# Patient Record
Sex: Male | Born: 1970 | Race: White | Hispanic: No | Marital: Married | Smoking: Never smoker
Health system: Southern US, Community
[De-identification: ages and names within clinical notes are randomized; demographics above are authoritative.]

## PROBLEM LIST (undated history)

## (undated) DIAGNOSIS — R911 Solitary pulmonary nodule: Secondary | ICD-10-CM

## (undated) DIAGNOSIS — R7303 Prediabetes: Secondary | ICD-10-CM

## (undated) DIAGNOSIS — Z87442 Personal history of urinary calculi: Secondary | ICD-10-CM

## (undated) DIAGNOSIS — I10 Essential (primary) hypertension: Secondary | ICD-10-CM

## (undated) DIAGNOSIS — M5136 Other intervertebral disc degeneration, lumbar region: Secondary | ICD-10-CM

## (undated) DIAGNOSIS — I712 Thoracic aortic aneurysm, without rupture, unspecified: Secondary | ICD-10-CM

## (undated) DIAGNOSIS — G4730 Sleep apnea, unspecified: Secondary | ICD-10-CM

## (undated) DIAGNOSIS — M1990 Unspecified osteoarthritis, unspecified site: Secondary | ICD-10-CM

## (undated) DIAGNOSIS — G5603 Carpal tunnel syndrome, bilateral upper limbs: Secondary | ICD-10-CM

## (undated) DIAGNOSIS — M51369 Other intervertebral disc degeneration, lumbar region without mention of lumbar back pain or lower extremity pain: Secondary | ICD-10-CM

## (undated) HISTORY — DX: Solitary pulmonary nodule: R91.1

## (undated) HISTORY — DX: Thoracic aortic aneurysm, without rupture, unspecified: I71.20

## (undated) HISTORY — DX: Thoracic aortic aneurysm, without rupture: I71.2

## (undated) HISTORY — PX: KNEE ARTHROSCOPY: SHX127

---

## 1997-11-16 ENCOUNTER — Emergency Department (HOSPITAL_COMMUNITY): Admission: EM | Admit: 1997-11-16 | Discharge: 1997-11-16 | Payer: Self-pay | Admitting: Emergency Medicine

## 1999-12-16 ENCOUNTER — Emergency Department (HOSPITAL_COMMUNITY): Admission: EM | Admit: 1999-12-16 | Discharge: 1999-12-16 | Payer: Self-pay | Admitting: Emergency Medicine

## 2000-01-30 ENCOUNTER — Emergency Department (HOSPITAL_COMMUNITY): Admission: EM | Admit: 2000-01-30 | Discharge: 2000-01-30 | Payer: Self-pay | Admitting: Emergency Medicine

## 2000-01-30 ENCOUNTER — Encounter: Payer: Self-pay | Admitting: Emergency Medicine

## 2000-09-03 ENCOUNTER — Encounter: Payer: Self-pay | Admitting: Emergency Medicine

## 2000-09-03 ENCOUNTER — Emergency Department (HOSPITAL_COMMUNITY): Admission: EM | Admit: 2000-09-03 | Discharge: 2000-09-03 | Payer: Self-pay | Admitting: Emergency Medicine

## 2003-01-31 ENCOUNTER — Emergency Department (HOSPITAL_COMMUNITY): Admission: EM | Admit: 2003-01-31 | Discharge: 2003-01-31 | Payer: Self-pay | Admitting: Emergency Medicine

## 2003-01-31 ENCOUNTER — Encounter: Payer: Self-pay | Admitting: Emergency Medicine

## 2007-02-14 ENCOUNTER — Observation Stay (HOSPITAL_COMMUNITY): Admission: EM | Admit: 2007-02-14 | Discharge: 2007-02-15 | Payer: Self-pay | Admitting: Emergency Medicine

## 2007-02-14 IMAGING — CR DG CHEST 2V
1 series · 1 of 1 positions shown · non-contrast
Comparison: None.

CLINICAL DATA: Chest pain, short of breath, hypertension.
 CHEST - 2 VIEW:

[w chest pa]
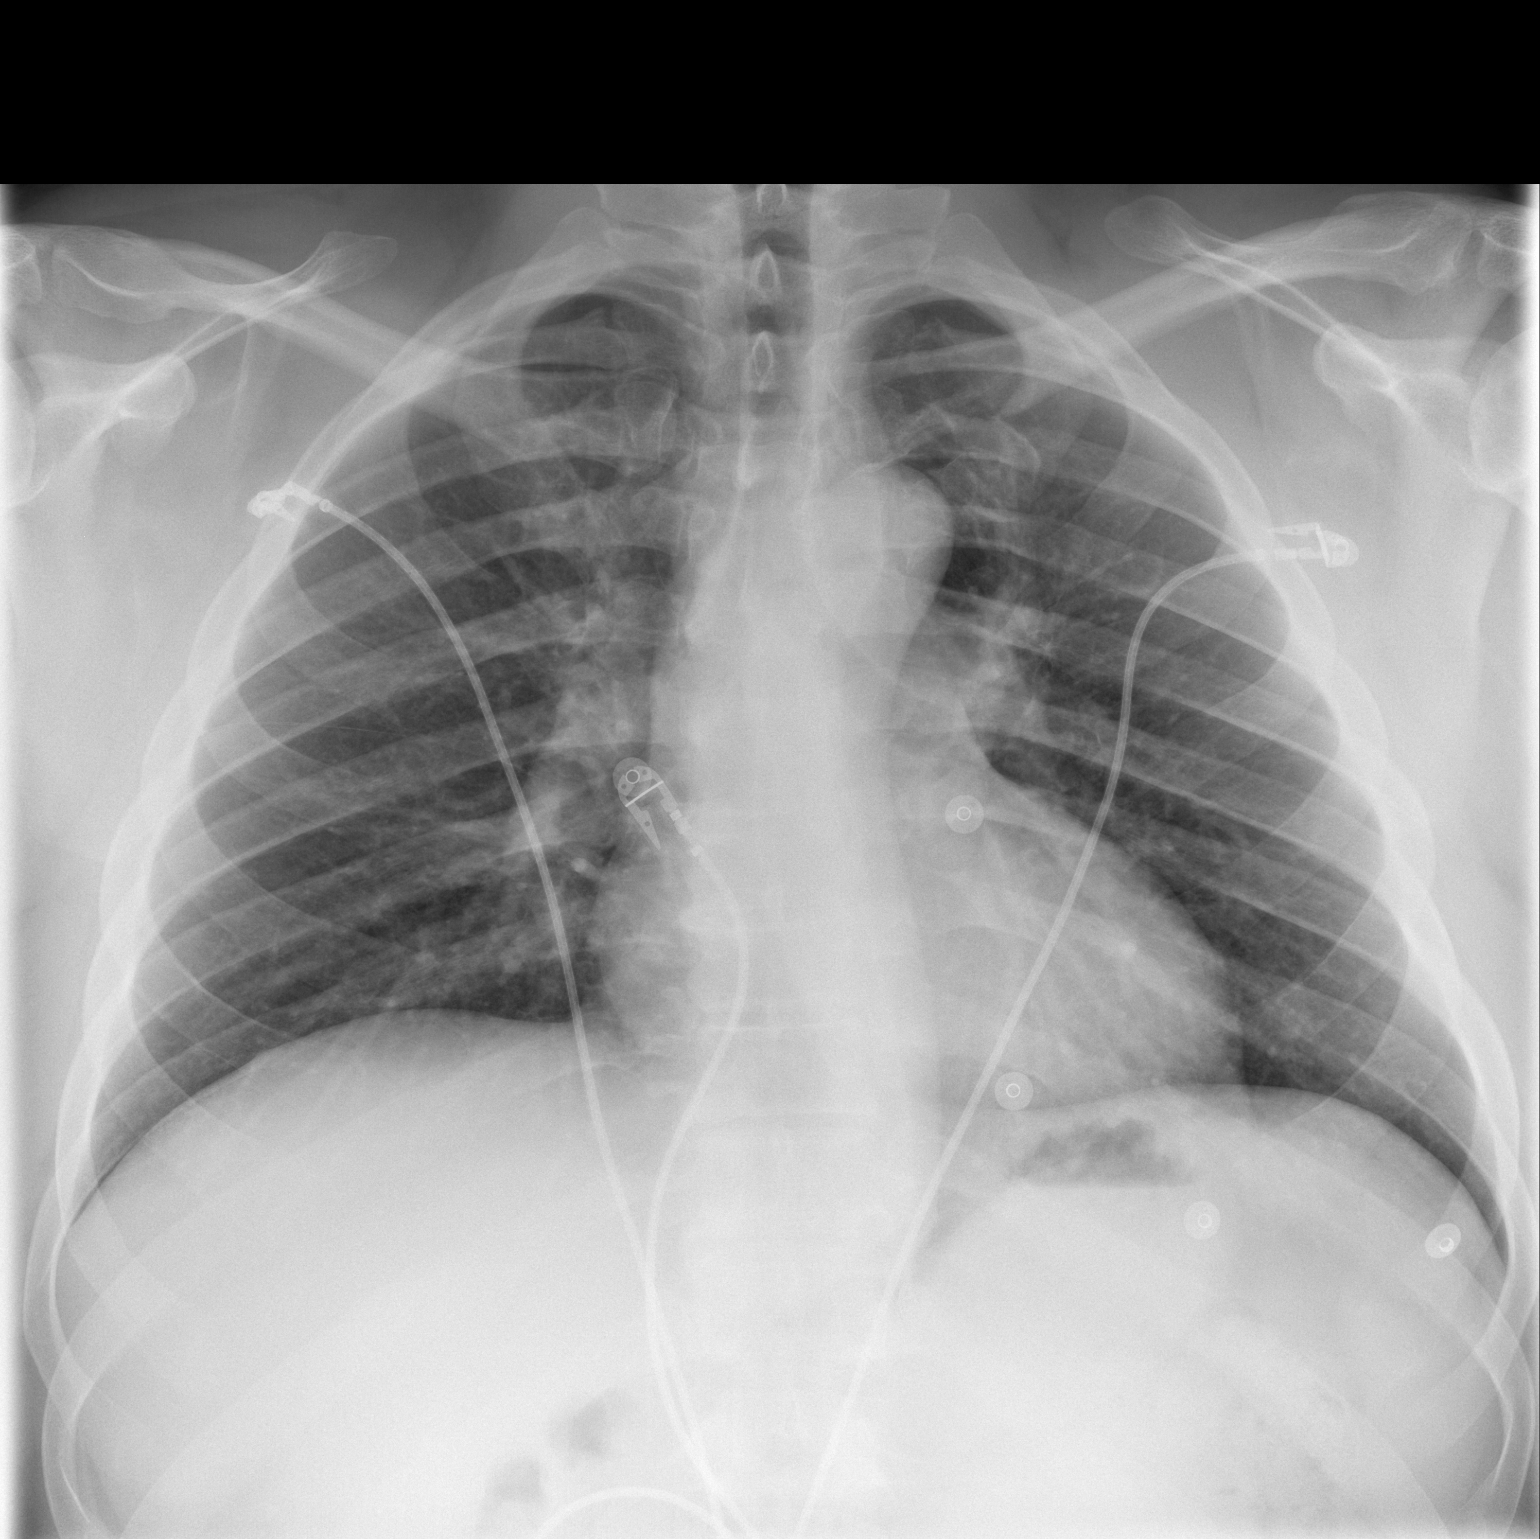

[1 of 1 positions shown; findings below may reference images not displayed]

FINDINGS: Artifact overlies the chest.  Heart size is normal.  The mediastinum is unremarkable.  The lungs are clear.  No effusions.  No significant bony finding.
IMPRESSION: No active disease.

## 2007-11-22 IMAGING — CR DG CERVICAL SPINE COMPLETE 4+V
1 series · 2 of 2 positions shown · non-contrast
Comparison: NONE

CLINICAL DATA: Right hand numbness. 

CERVICAL SPINE ??? AP AND LATERAL VIEWS

[Series 1: view not recorded · 0.17mm/px · 2 of 2 slices shown]
[im 1/2]
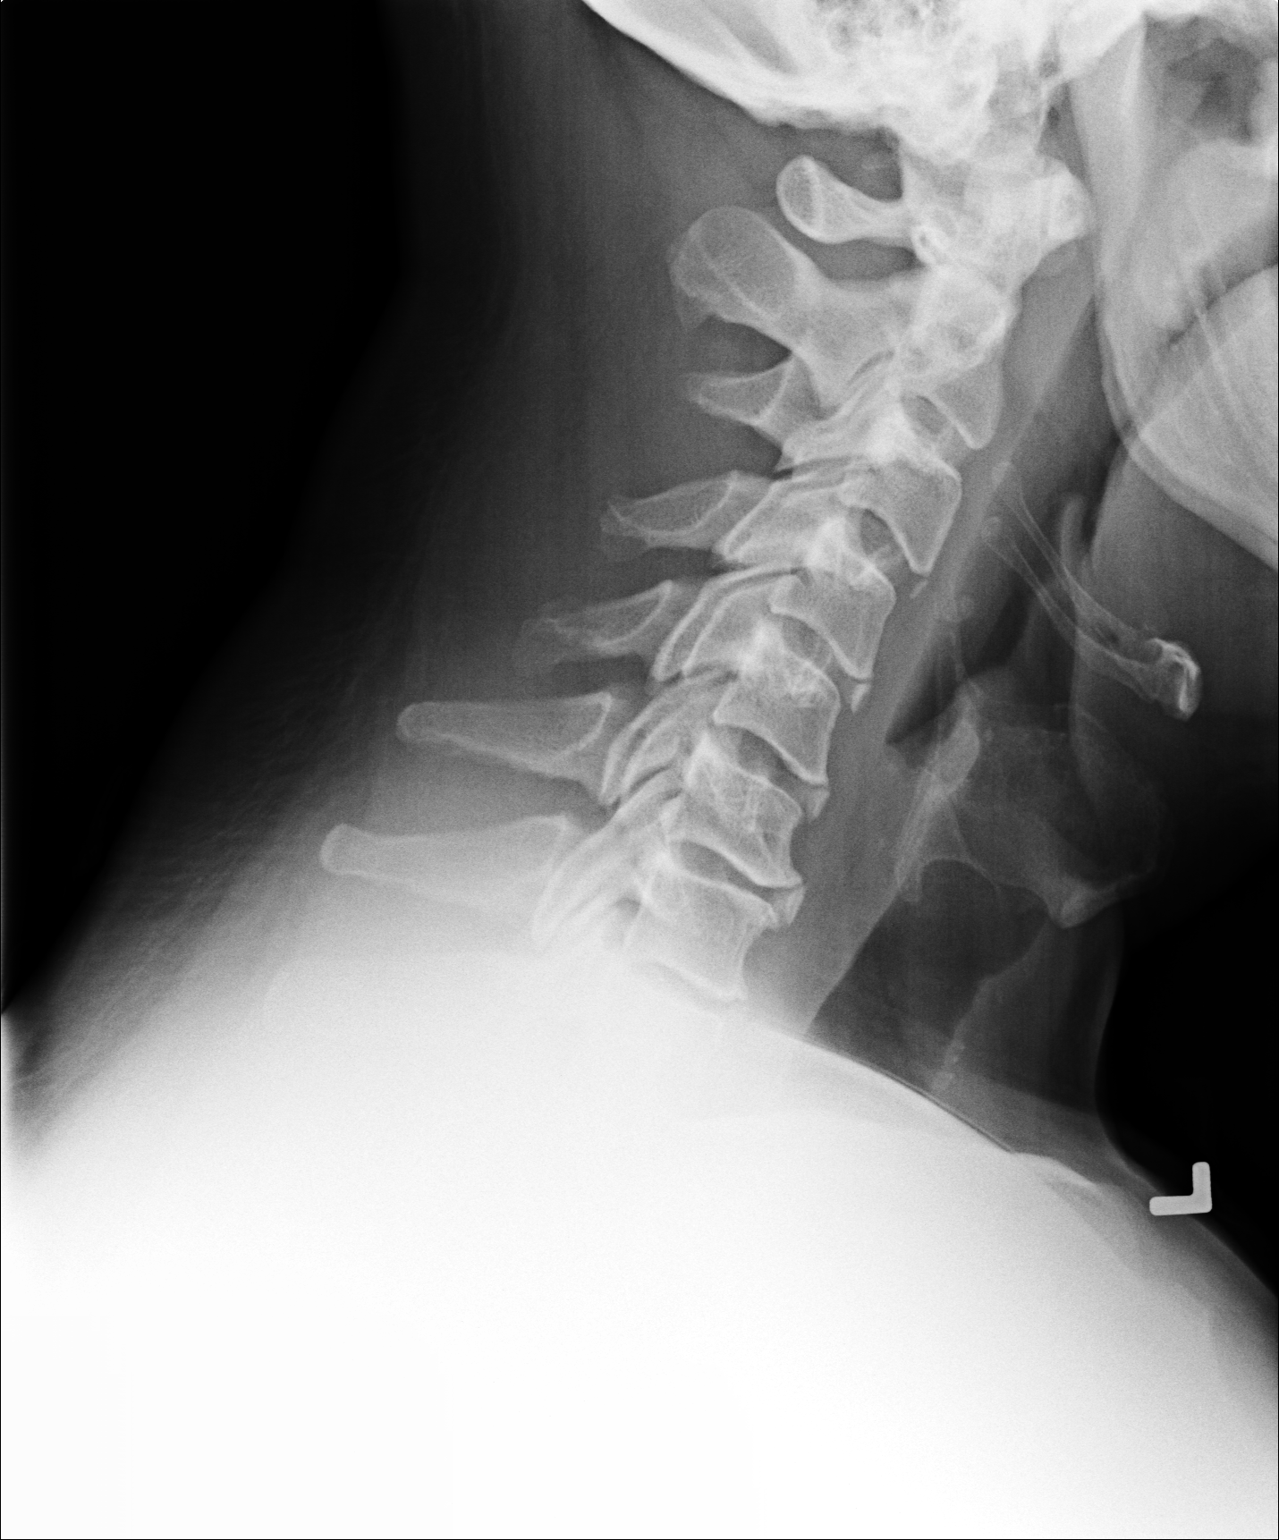
[im 2/2]
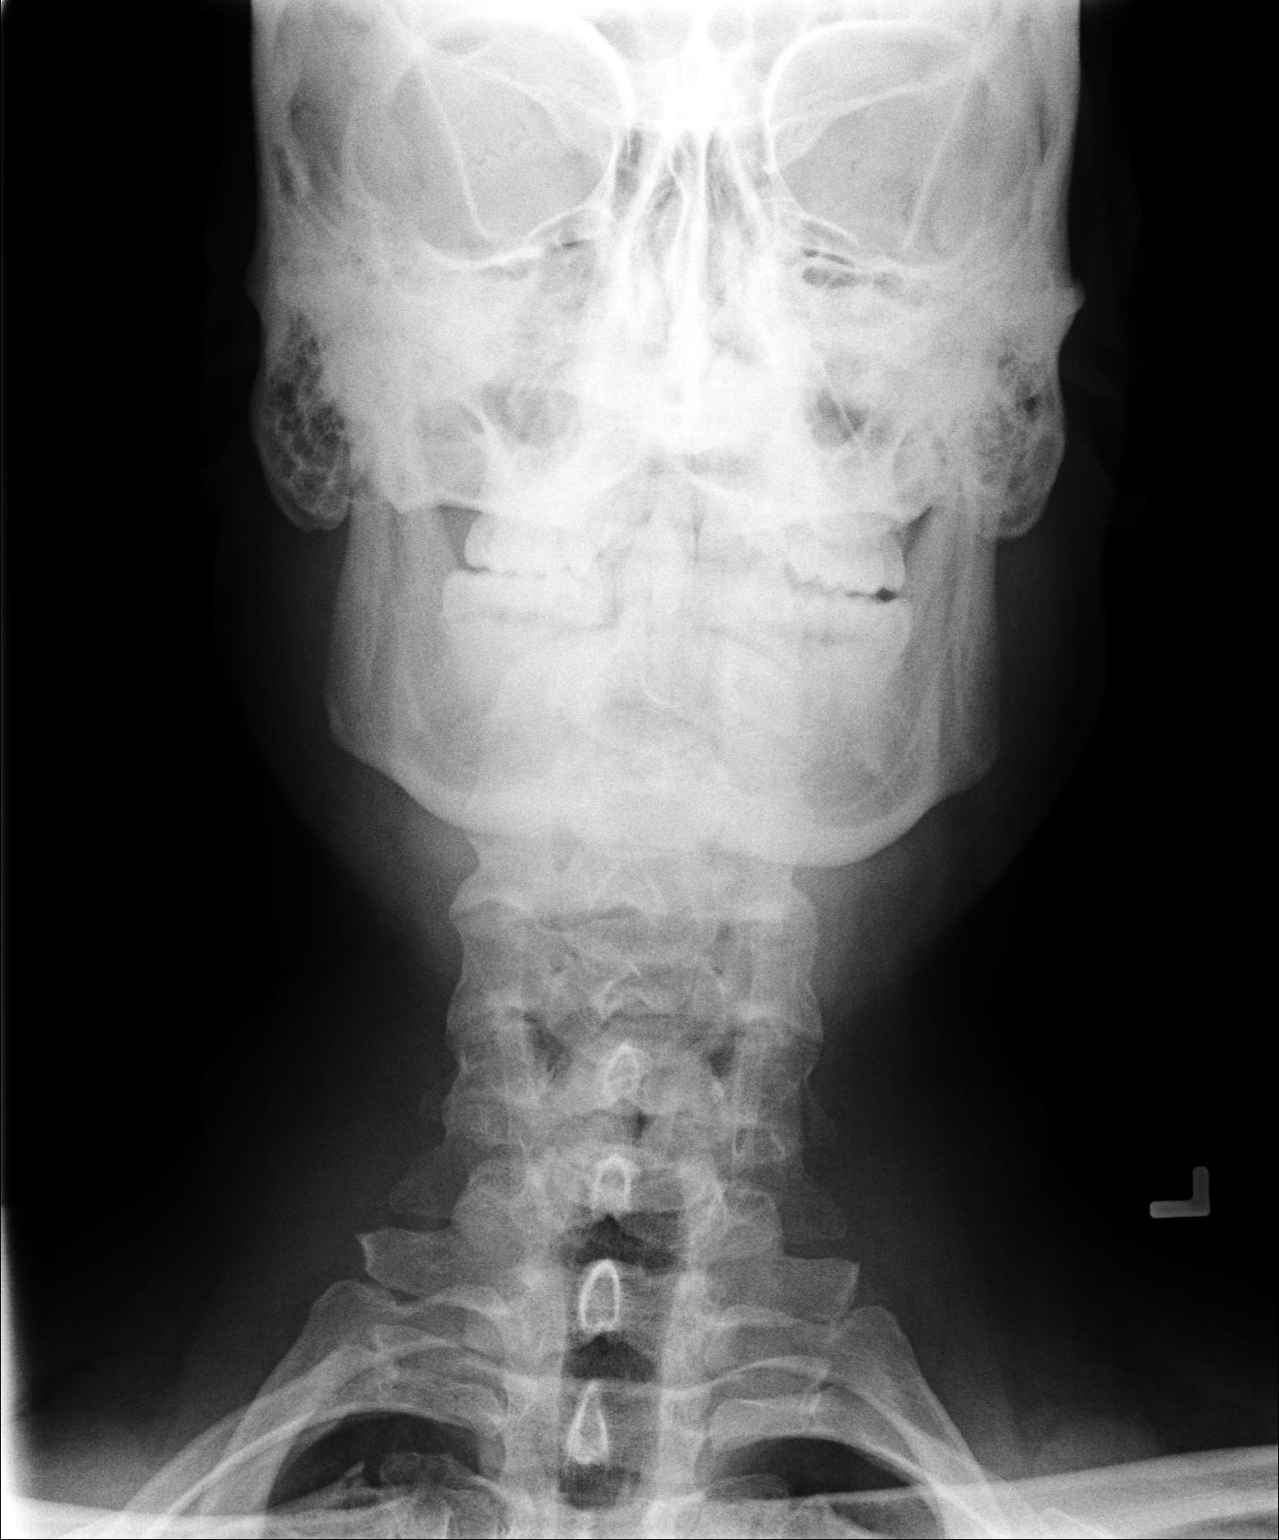

[2 of 2 positions shown; findings below may reference images not displayed]

FINDINGS: Normal alignment. Multi-level spondylosis, most severe 
at C6-C7 and C7-T1 with disc space narrowing. No fractures, 
dislocations, or prevertebral soft tissue swelling.
IMPRESSION: Multi-level spondylosis. No acute disease. SAMUEL 
[DATE]  Trans Date: [DATE] SAMUEL

## 2010-09-30 NOTE — Discharge Summary (Signed)
NAMELAVARR, PRESIDENT                ACCOUNT NO.:  [id number]   MEDICAL RECORD NO.:  [id number]          PATIENT TYPE:  OBV   LOCATION:  6527                         FACILITY:  [facility]   PHYSICIAN:  Barnetta Chapel, MDDATE OF BIRTH:  18-Sep-1970   DATE OF ADMISSION:  02/14/2007  DATE OF DISCHARGE:  02/15/2007                               DISCHARGE SUMMARY   Discharged by the cardiologist, Dr. Katrinka Blazing, with [facility], on  February 15, 2007.   DISCHARGE DIAGNOSES:  1. Chest pain.  2. Hyperlipidemia.  3. Hypertension.  4. Obesity.   BRIEF HISTORY AND HOSPITAL COURSE:  Please refer to the H&P done on  February 14, 2007.  The patient is a 40 year old male with a past  medical history significant for hypertension, hyperlipidemia and  obesity.  The patient was admitted with recurrent chest pain.  Cardiac  enzymes on admission were normal.  The EKG was normal as well.  The  patient was admitted to rule out MI.  Cardiology consult was called and  the patient was seen by Dr. Katrinka Blazing.  The cardiologist was of the  impression that the chest pain was non cardiac.  iIt was felt that the  chest pain was more likely related to anxiety or musculoskeletal pain.  Dr. Katrinka Blazing discharged the patient home.   DISCHARGE PLANS:  The patient is to follow up with both Dr. Katrinka Blazing and  the primary care Johnice Riebe.   DISCHARGE MEDS:  As per Dr. Michaelle Copas instruction.      Barnetta Chapel, MD  Electronically Signed     SIO/MEDQ  D:  02/15/2007  T:  02/15/2007  Job:  [id number]   cc:   Lyn Records, M.D.  Vikki Ports, M.D.

## 2010-09-30 NOTE — H&P (Signed)
[name], [name]                ACCOUNT NO.:  [id number]   MEDICAL RECORD NO.:  [id number]          PATIENT TYPE:  OBV   LOCATION:  1831                         FACILITY:  [facility]   PHYSICIAN:  Barnetta Chapel, MDDATE OF BIRTH:  [birth year]   DATE OF ADMISSION:  02/14/2007  DATE OF DISCHARGE:                              HISTORY & PHYSICAL   PRIMARY CARE [name]:  [name], M.D.   CHIEF COMPLAINT:  Chest pain.   HISTORY OF PRESENTING COMPLAINT:  The patient is a 40 year old obese  male with past medical history significant for hypertension and  hyperlipidemia.  The patient is not compliant with the high blood  pressure medication.  The patient's mom died of a heart attack in her  early 39's.  The patient presented with chest pain that started about  two days ago.  The pain is  intermittent.  It does seem to occur more  when she is upset.  The pain is said to be dull and is rated about 5/10.  The pain only lasts for about a few minutes.  The pain has no known  precipitant but does tend to improve, when the patient tries to calm  himself down.  He also reported possible association of the chest pain  with periods when he is upset and anxious.  No associated diaphoresis,  shortness of breath, nausea or vomiting.  No fever or chills.  No  headache, no neck pain,   PAST MEDICAL HISTORY:  Includes:  1. Obesity.  2. Hypertension.  3. Hyperlipidemia.  4. Kidney stones.   PAST SURGICAL HISTORY:  Knee surgery.   ALLERGIES:  Nil.   SOCIAL HISTORY:  The patient drinks alcohol occasionally.  He uses  cannabis.  He denied any use of cigarette, and the patient is single.   FAMILY HISTORY:  Significant for MI in the patient's mom.  The patient's  grandparents have diabetes and high blood pressure.   REVIEW OF SYSTEMS:  VITAL SIGNS:  Blood pressure on presentation was  156/99 mmHg, but now down to 138/94 mmHg.  Hematocrit is 79.  The  respiratory rate is 20, and O2 sat is  99%.  HEENT:  No pallor, no  jaundice.  Extraocular movements intact.  NECK:  Supple.  There is no  JVD or lymphadenopathy.  LUNGS:  Clear to auscultation.  CVS:  S1, S2,  no heart murmur or appreciated PRNS.  ABDOMEN:  Obese, soft and  nontender.  Her organs were difficult to assess.  The bowel sounds are  present.  NEURO:  Nonfocal.  EXTREMITIES:  No edema.   LABORATORY:  Labs done so far, the chest x-ray is read as no acute  abnormalities.  EKG reveals normal sinus rhythm with no ST-T segment  changes.  Sodium is 141, potassium is 3.6, chloride is 107, CO2 of 28,  blood sugar of 81, BUN of 10 and creatinine of 0.96.  Calcium is 9.5.  UA is essentially normal.  White count is 5.3, hemoglobin of 15.8,  hematocrit of 45.8, platelet count of 206 and MCV of 92.8.  Creatinine  is 1.0.  Troponin is less than 0.05.   IMPRESSION:  1. Chest pain, rule out acute coronary syndrome.  2. Hypertension.  3. Obesity.  4. History of hyperlipidemia.   PLAN:  Will admit the patient to telemetry floor.  Will cycle cardiac  enzymes.  Will get a pathologic consult.  Will control the patient's  blood pressure.  Will check fasting lipid profile.  Further management  will depend on her hospital course.      Barnetta Chapel, MD  Electronically Signed     SIO/MEDQ  D:  02/14/2007  T:  02/14/2007  Job:  [id number]   cc:   [name], M.D.

## 2010-09-30 NOTE — Consult Note (Signed)
[name], [name]                ACCOUNT NO.:  [id number]   MEDICAL RECORD NO.:  [id number]          PATIENT TYPE:  OBV   LOCATION:  6527                         FACILITY:  [facility]   PHYSICIAN:  Lyn Records, M.D.   DATE OF BIRTH:  15-Sep-1970   DATE OF CONSULTATION:  02/15/2007  DATE OF DISCHARGE:  02/15/2007                                 CONSULTATION   REASON FOR ADMISSION:  Chest discomfort.   CONCLUSIONS:  1. Left chest pressure/tightness, somewhat vague, not exacerbated by      physical activity.  Myocardial infarction ruled out.  Coronary      artery disease needs to be excluded.  2. Obesity.  3. History of snoring, restless sleep pattern, and initial right axis      deviation on EKG all raise the possibility of obstructive sleep      apnea.  4. Significant hyperlipidemia with atherogenic components including      significantly elevated LDL and low HDL.  5. Elevated blood pressure, not currently addressed.  6. Elevated ALT, liver tests.  According to the patient, this has been      present at times in the past.   RECOMMENDATIONS:  1. Now that myocardial infarction has been excluded by serial markers      and serial EKGs, we feel it would be safe to discharge the patient      home for outpatient stress testing.  2. We will ask the patient to take an aspirin 325 mg tablet daily at      least until after the stress test is done.  3. The patient should be considered for outpatient sleep study to      exclude the possibility of obstructive sleep apnea given obvious      risk factors.  4. The patient's blood pressure should be monitored closely and      certainly we will have additional information about his blood      pressure from the stress test that is performed.  If we document      blood pressure elevation, we should consider starting      antihypertensive therapy and at his subset potentially an ACE      inhibitor and/or beta blocker would be a reasonable  consideration.  5. The patient's lipid profile was atherogenic.  I discussed this with      him briefly.  This should be addressed by his primary care      physician and given his family history, primary preventive measures      should be considered in addition to weight loss and diet if his LDL      remains above 130 on followup.   COMMENTS:  The patient is 40 years of age and has been previously  healthy.  He has a history of obesity, elevated blood pressure, and  lipids that are elevated on this particular admission.  There is also a  family history of premature atherosclerosis with the patient's mother  having myocardial infarction in her early 85s.  He awakened on February 14, 2007 with a dull  chest discomfort that persisted.  He went to his  primary care physician's office and despite having a normal EKG, he was  sent to the emergency room and has been admitted, where he has ruled out  for myocardial infarction.  His chest discomfort has totally resolved.  There has been no associated shortness of breath, radiation, or other  clinical features.  The discomfort was substernal, however.   ALLERGIES:  None known.   HABITS:  Does not smoke cigarettes.  Has occasional alcoholic beverage.  Has used marijuana at times in the past.   FAMILY HISTORY:  Has already been discussed above.  Significant other  medical issues include previous history of knee surgery and history of  kidney stones.   SOCIAL HISTORY:  The patient is married.  He owns a [employer].  He has been under a lot of financial stress recently.   PHYSICAL EXAMINATION:  The patient is in no distress, he is lying on his  bed at 45 degrees.  He is not dyspneic.  His blood pressures in the 140/80 range and heart rate is 80.  His O2  saturation is 97%.  There is no obvious JVD.  No carotid bruits.  LUNGS:  Clear.  CARDIAC:  Reveals no rub, click, or gallop.  ABDOMEN:  Soft.  EXTREMITIES:  Do not reveal edema.    DIAGNOSTIC DATA:  EKG repeated this morning is basically normal.  Initial EKG done on February 14, 2007 revealed a vertical axis.   Laboratory data that is significant includes an HDL cholesterol of 30,  total cholesterol of 234 and HDL of 171.  The patient's D-dimer was less  than 0.22.  Several sets of cardiac markers were all normal.  His  creatinine was 0.96.   DISCUSSION:  The patient is 22 and has atypical chest discomfort,  probably musculoskeletal or reflux related.  Cannot totally exclude the  possibility of obstructive coronary disease.  Therefore, an outpatient  stress test will be performed.  This will also allow [place] to evaluate the  patient's blood pressure response and exercise tolerance.  We will  recommend that the patient take an aspirin a day after his treadmill  test is completed.  We also cautioned the patient to call us should he  have recurrence of chest discomfort.      Lyn Records, M.D.  Electronically Signed     HWS/MEDQ  D:  02/15/2007  T:  02/15/2007  Job:  [id number]   cc:   Sigmund Hazel, M.D.  Vikki Ports, M.D.

## 2011-02-26 LAB — POCT CARDIAC MARKERS
CKMB, poc: <1 — ABNORMAL LOW
Myoglobin, poc: 68.9
Myoglobin, poc: 77.1
Operator id: 146091
Operator id: 146091

## 2011-02-26 LAB — I-STAT 8, (EC8 V) (CONVERTED LAB)
Acid-Base Excess: 3 — ABNORMAL HIGH
BUN: 9
Bicarbonate: 27.3 — ABNORMAL HIGH
Chloride: 107
Glucose, Bld: 88
HCT: 48
Hemoglobin: 16.3
Operator id: 146091
Potassium: 3.8
Sodium: 142
TCO2: 29
pCO2, Ven: 40.9 — ABNORMAL LOW
pH, Ven: 7.433 — ABNORMAL HIGH

## 2011-02-26 LAB — TROPONIN I: Troponin I: 0.01

## 2011-02-26 LAB — TRICYCLICS SCREEN, URINE: TCA Scrn: NOT DETECTED

## 2011-02-26 LAB — CBC
HCT: 44.7
HCT: 45.8
Hemoglobin: 15.4
Hemoglobin: 15.8
MCHC: 34.4
MCHC: 34.6
MCV: 92.8
MCV: 92.8
Platelets: 212
RBC: 4.81
RDW: 12.3
RDW: 12.6
WBC: 5.8

## 2011-02-26 LAB — PROTIME-INR: INR: 1

## 2011-02-26 LAB — CARDIAC PANEL(CRET KIN+CKTOT+MB+TROPI)
CK, MB: 1
CK, MB: 1.1
Relative Index: 0.9
Relative Index: INVALID
Total CK: 119
Total CK: 98
Troponin I: <0.01
Troponin I: <0.01

## 2011-02-26 LAB — URINALYSIS, ROUTINE W REFLEX MICROSCOPIC
Bilirubin Urine: NEGATIVE
Ketones, ur: NEGATIVE
Nitrite: NEGATIVE
Specific Gravity, Urine: 1.021
Urobilinogen, UA: 1

## 2011-02-26 LAB — COMPREHENSIVE METABOLIC PANEL
Alkaline Phosphatase: 72
BUN: 10
Calcium: 9.5
Creatinine, Ser: 0.96
Glucose, Bld: 81
Total Protein: 7.2

## 2011-02-26 LAB — DIFFERENTIAL
Basophils Relative: 0
Lymphs Abs: 1.7
Monocytes Relative: 11
Neutro Abs: 2.7
Neutrophils Relative %: 52

## 2011-02-26 LAB — LIPID PANEL
Cholesterol: 234 — ABNORMAL HIGH
HDL: 30 — ABNORMAL LOW
LDL Cholesterol: 171 — ABNORMAL HIGH
Total CHOL/HDL Ratio: 7.8
Triglycerides: 163 — ABNORMAL HIGH
VLDL: 33

## 2011-02-26 LAB — D-DIMER, QUANTITATIVE: D-Dimer, Quant: <0.22

## 2011-02-26 LAB — CK TOTAL AND CKMB (NOT AT ARMC)
CK, MB: 1.4
Relative Index: 1.2

## 2011-02-26 LAB — RAPID URINE DRUG SCREEN, HOSP PERFORMED
Opiates: NOT DETECTED
Tetrahydrocannabinol: POSITIVE — AB

## 2011-02-26 LAB — APTT: aPTT: 31

## 2011-02-26 LAB — SEDIMENTATION RATE: Sed Rate: 3

## 2013-03-16 ENCOUNTER — Ambulatory Visit
Admission: RE | Admit: 2013-03-16 | Discharge: 2013-03-16 | Disposition: A | Discharge status: Home / Self Care | Payer: 59 | Source: Ambulatory Visit | Attending: Family Medicine | Admitting: Family Medicine

## 2013-03-16 ENCOUNTER — Other Ambulatory Visit: Payer: Self-pay | Admitting: Family Medicine

## 2013-03-16 DIAGNOSIS — R319 Hematuria, unspecified: Secondary | ICD-10-CM

## 2013-03-16 IMAGING — CT CT ABD-PELV W/O CM
3 of 4 series · 8 of 46 positions shown, 15 images · non-contrast
Comparison: None.

CLINICAL DATA: Bilateral flank pain for several weeks, some
episodes of gross hematuria, history of kidney stones

EXAM:
CT ABDOMEN AND PELVIS WITHOUT CONTRAST
TECHNIQUE: Multidetector CT imaging of the abdomen and pelvis was performed
following the standard protocol without intravenous contrast.

[Series 3: lung windows · axial · 0.90mm/px · z∈[-67,+8]mm · 4 of 25 slices shown, 9 images]
[im 5/25  soft-tissue]
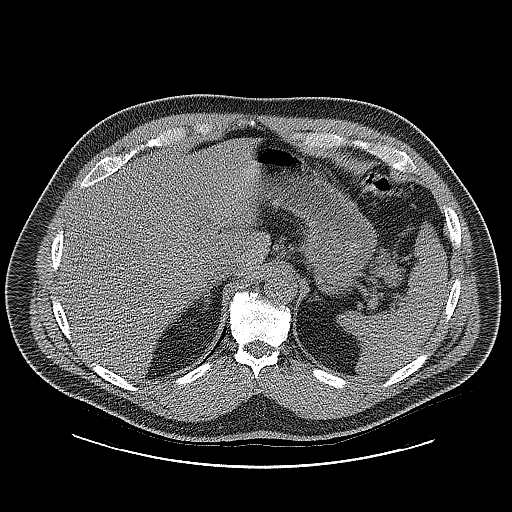
[im 5/25  lung]
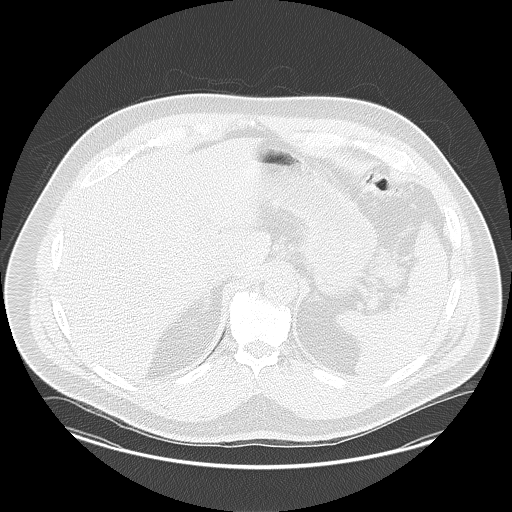
[im 5/25  bone]
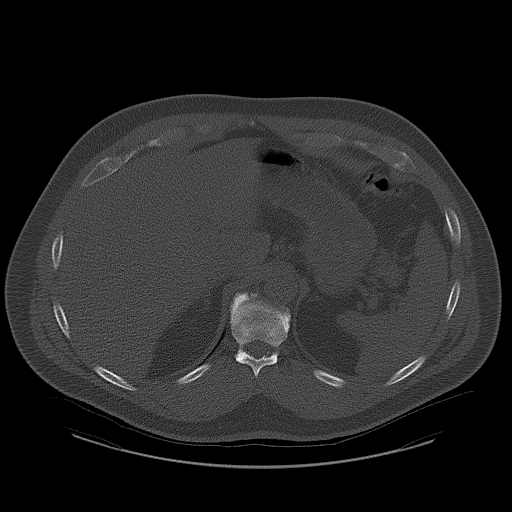
[im 10/25  soft-tissue]
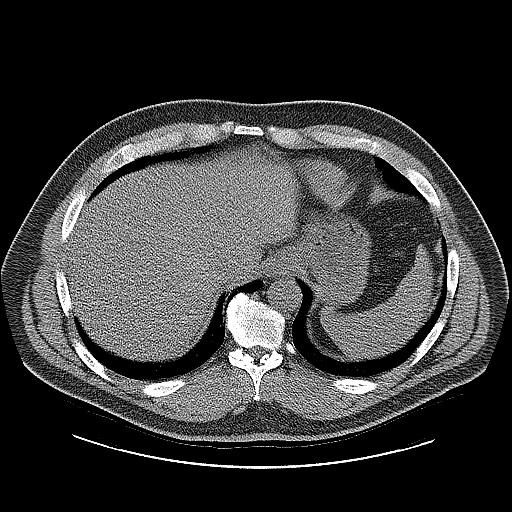
[im 10/25  lung]
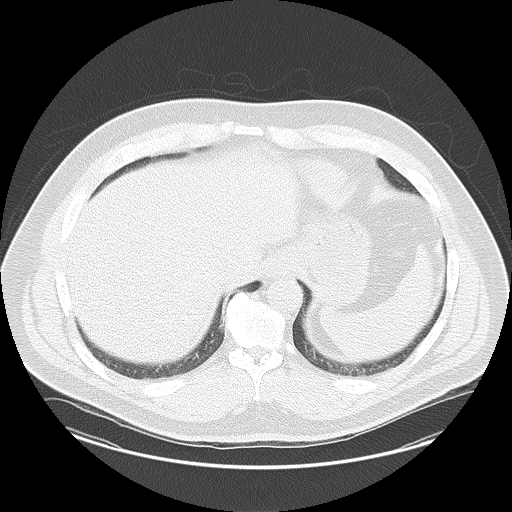
[im 15/25  soft-tissue]
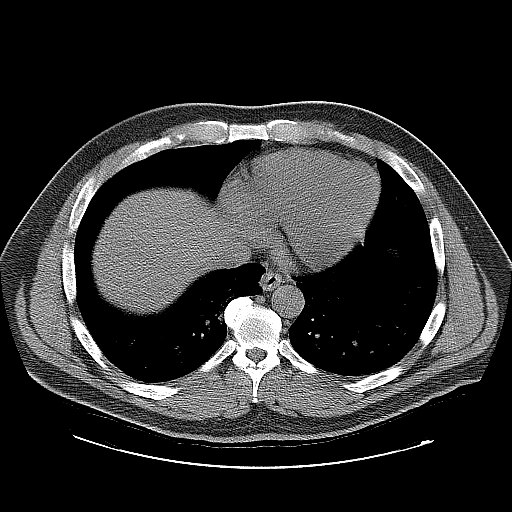
[im 15/25  lung]
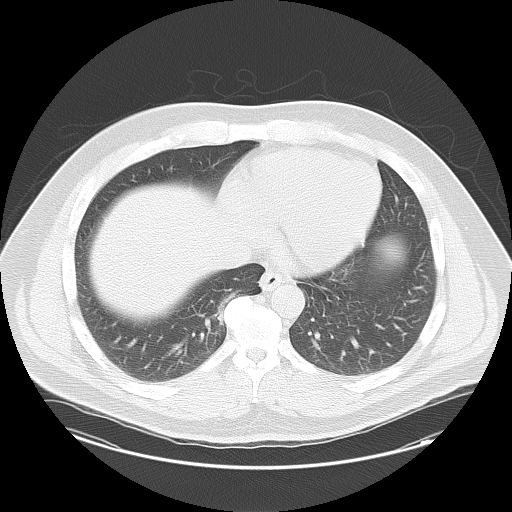
[im 20/25  soft-tissue]
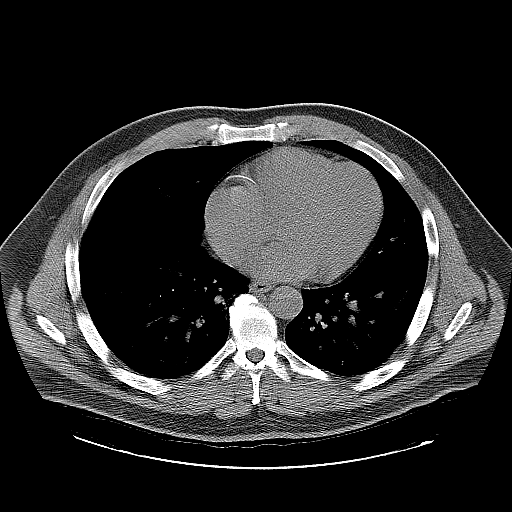
[im 20/25  lung]
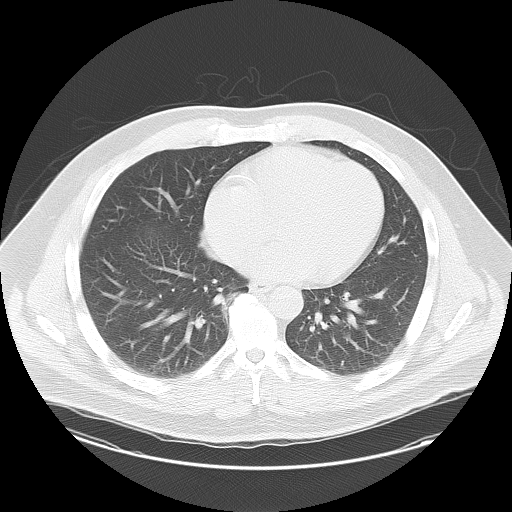

[Series 400: coronal · coronal · 1.04mm/px · 3 of 144 slices shown, 4 images]
[im 48/144  soft-tissue]
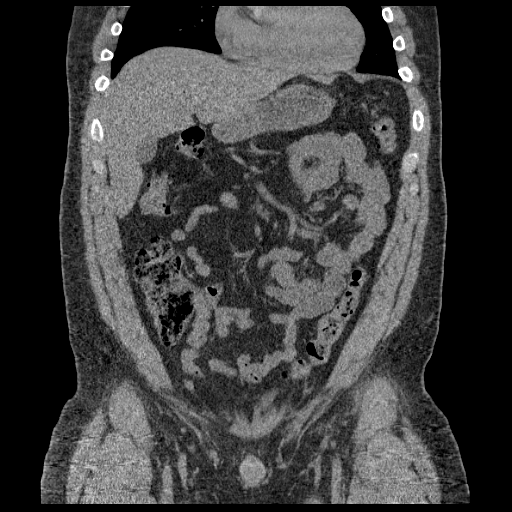
[im 64/144  soft-tissue]
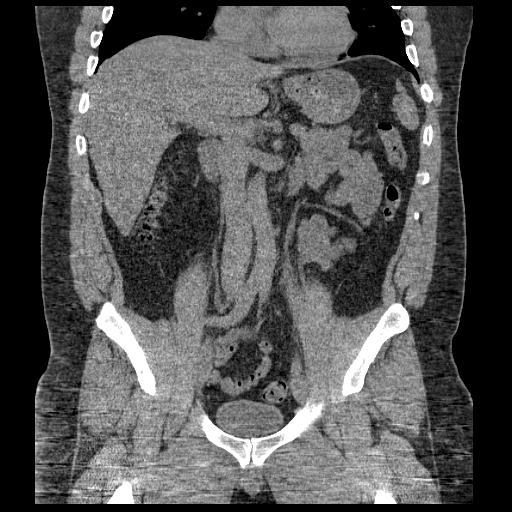
[im 64/144  bone]
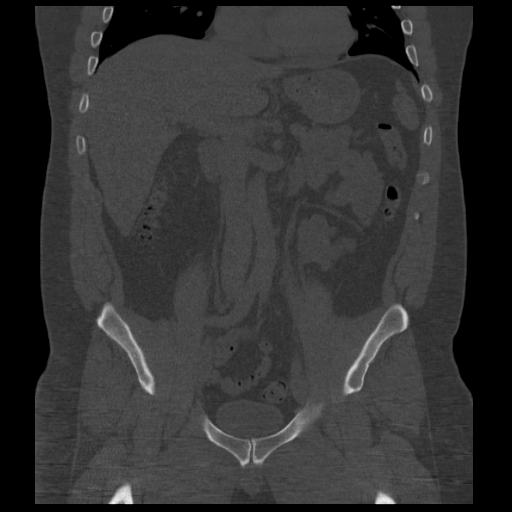
[im 80/144  soft-tissue]
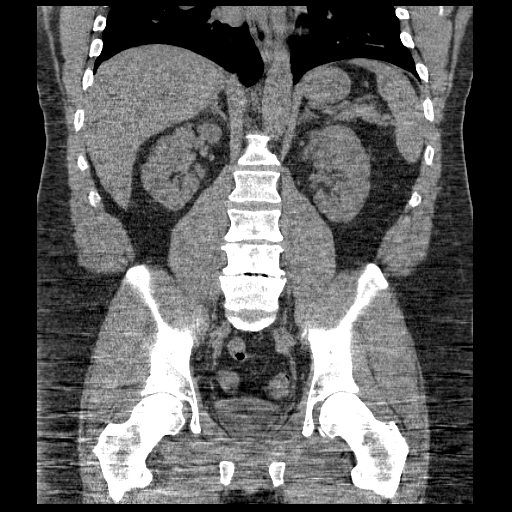

[Series 401: sagittal · sagittal · 1.04mm/px · 1 of 186 slices shown, 2 images]
[im 62/186  soft-tissue]
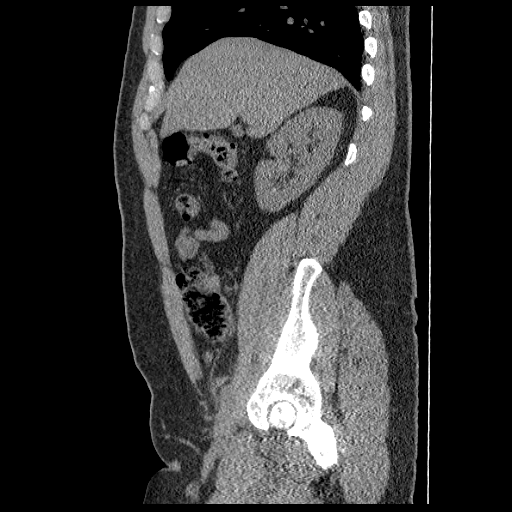
[im 62/186  bone]
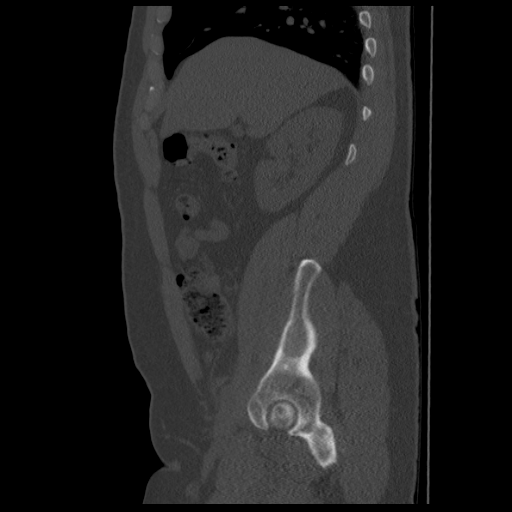

[8 of 46 positions shown; findings below may reference images not displayed]

FINDINGS: The lung bases are clear. The liver is unremarkable in the
unenhanced state. A single small gallstone layers dependently within
the gallbladder. The pancreas is normal in size and the pancreatic
duct is not dilated. The adrenal glands and spleen are unremarkable.
The stomach is not well distended. There are small nonobstructing
left renal calculi present. No definite right renal calculi are
seen. However, there is a small calculus within the proximal left
ureter measuring 2 mm in diameter, not creating current obstruction.
The abdominal aorta is normal caliber. No adenopathy is seen.

The distal ureters are normal in caliber and no distal ureteral
calculus is seen. The urinary bladder is not well distended but no
abnormality is noted. The prostate is normal in size. The terminal
ileum is unremarkable. The appendix is within normal limits as well.
No fluid is seen within the pelvis. There is age advanced
degenerative disc disease present at the L4-5 level with posterior
osteophyte narrowing the central canal.
IMPRESSION: 1. Currently nonobstructing 2 mm proximal left ureteral calculus.
2. Small nonobstructing left renal calculus. No definite right renal
calculi are noted.
3. Age advanced degenerative disc disease at L4-5.
4. Single small gallstone layering within the gallbladder.

## 2014-03-08 ENCOUNTER — Ambulatory Visit (HOSPITAL_COMMUNITY)
Admission: RE | Admit: 2014-03-08 | Discharge: 2014-03-08 | Disposition: A | Discharge status: Home / Self Care | Payer: 59 | Source: Ambulatory Visit | Attending: Physical Medicine and Rehabilitation | Admitting: Physical Medicine and Rehabilitation

## 2014-03-08 ENCOUNTER — Other Ambulatory Visit (HOSPITAL_COMMUNITY): Payer: Self-pay | Admitting: Physical Medicine and Rehabilitation

## 2014-03-08 DIAGNOSIS — M549 Dorsalgia, unspecified: Secondary | ICD-10-CM | POA: Insufficient documentation

## 2014-03-08 DIAGNOSIS — G8929 Other chronic pain: Secondary | ICD-10-CM

## 2014-03-08 DIAGNOSIS — M542 Cervicalgia: Secondary | ICD-10-CM

## 2014-03-08 IMAGING — CR DG ORBITS FOR FOREIGN BODY
2 series · 2 of 2 positions shown · non-contrast
Comparison: None.

CLINICAL DATA: Chronic neck and back pain, pre MRI, the patient is
a welder

EXAM:
ORBITS FOR FOREIGN BODY - 2 VIEW

[w waters *]
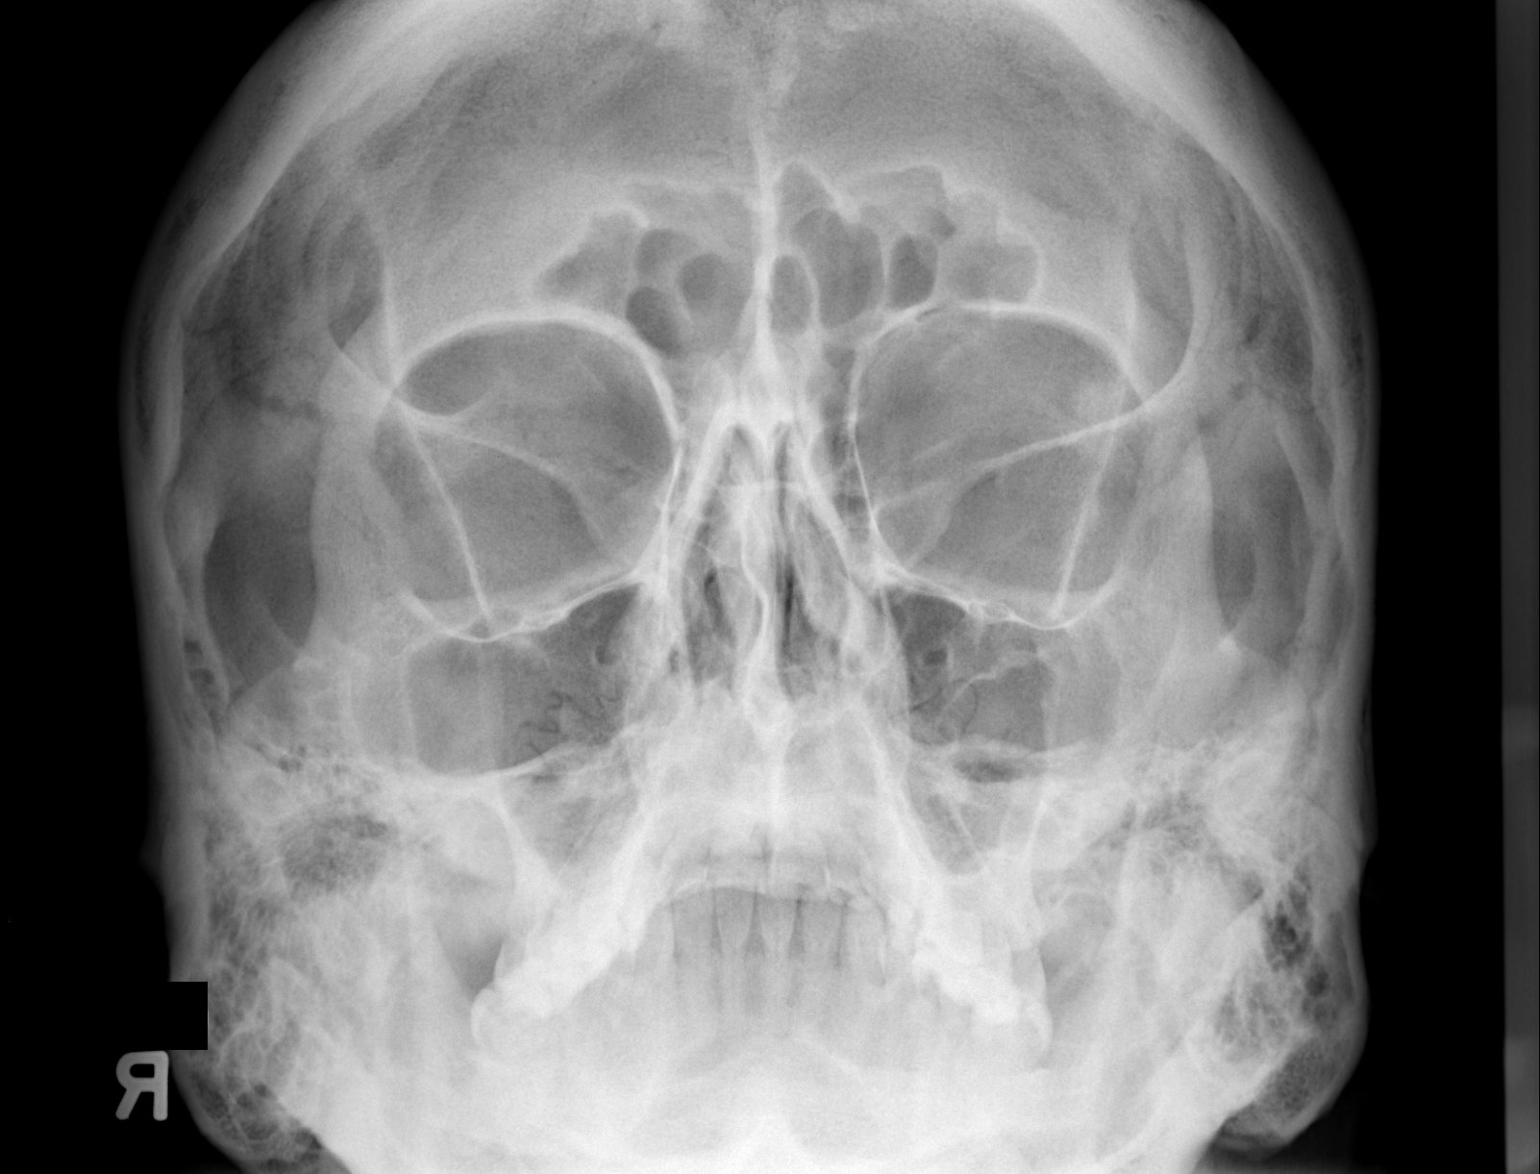

[w waters]
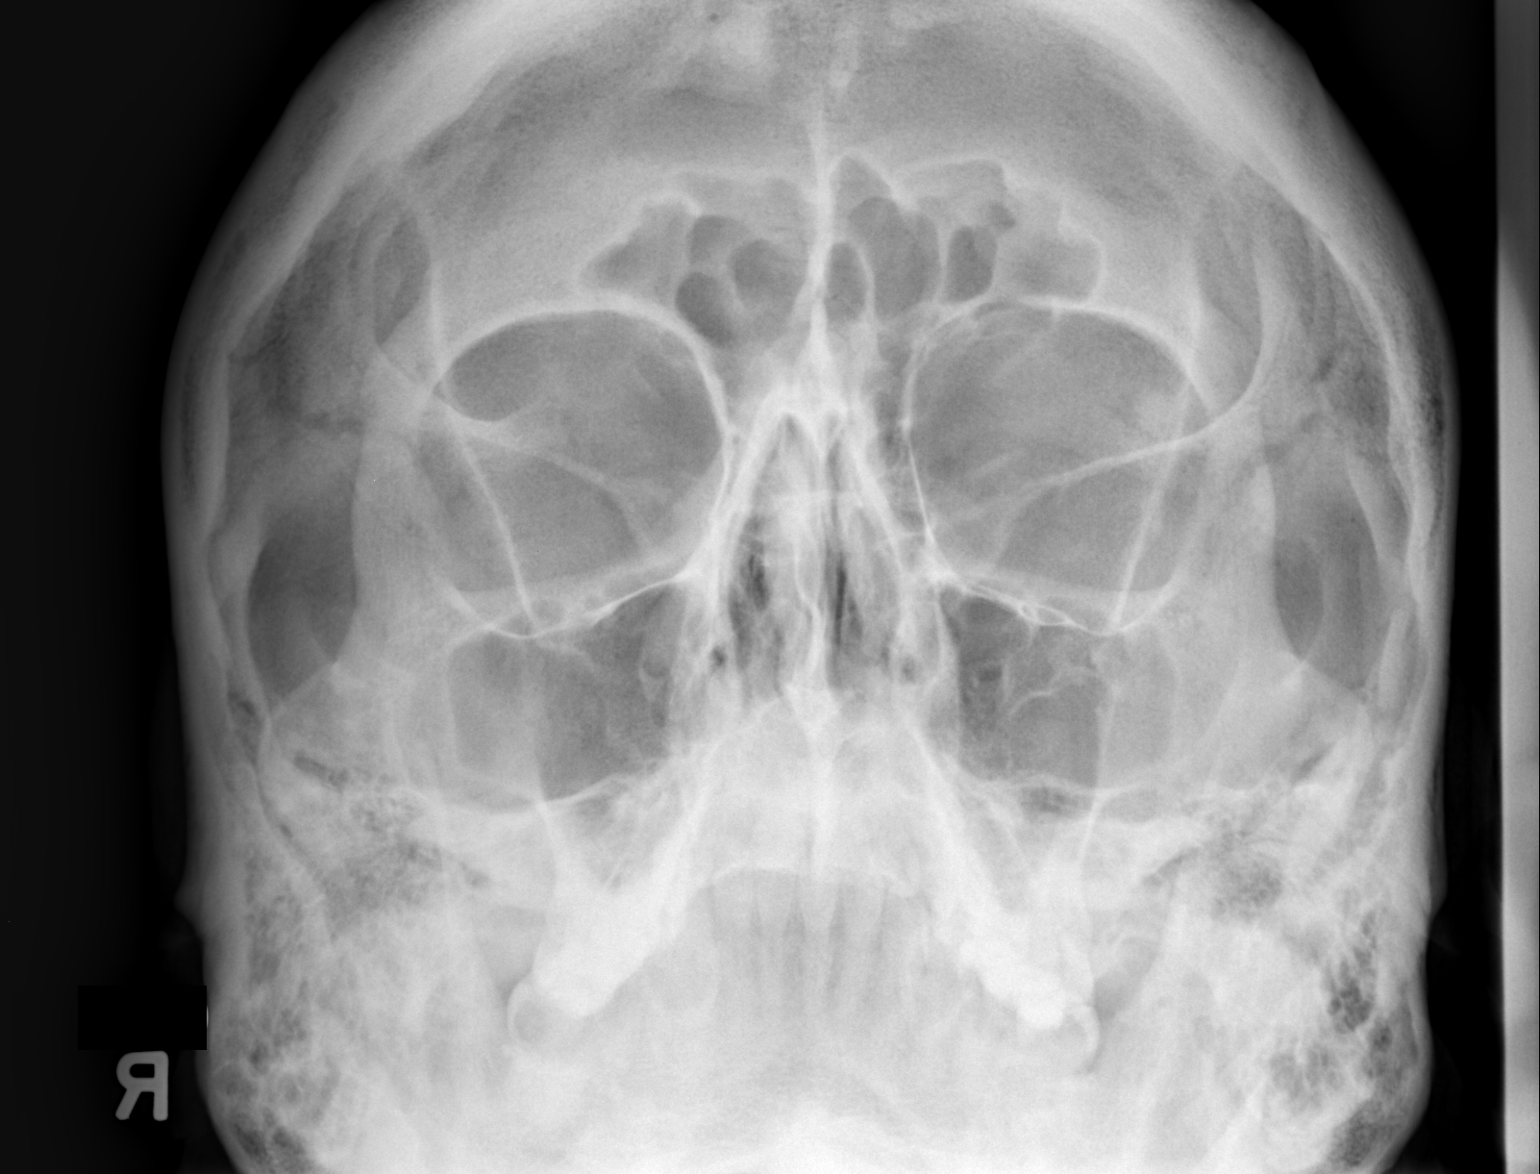

[2 of 2 positions shown; findings below may reference images not displayed]

FINDINGS: Views of the orbits were obtained with the patient looking to the
left and looking to the right. No orbital metallic foreign body is
seen. No bony abnormality is noted. The paranasal sinuses appear
clear.
IMPRESSION: No orbital metallic foreign body.

## 2014-08-17 DIAGNOSIS — G5603 Carpal tunnel syndrome, bilateral upper limbs: Secondary | ICD-10-CM

## 2014-08-17 HISTORY — DX: Carpal tunnel syndrome, bilateral upper limbs: G56.03

## 2014-08-21 ENCOUNTER — Encounter (HOSPITAL_BASED_OUTPATIENT_CLINIC_OR_DEPARTMENT_OTHER): Payer: Self-pay | Admitting: *Deleted

## 2014-08-21 NOTE — Pre-Procedure Instructions (Signed)
To come for BMET and EKG 

## 2014-08-21 NOTE — Progress Notes (Signed)
   08/21/14 1044  OBSTRUCTIVE SLEEP APNEA  Have you ever been diagnosed with sleep apnea through a sleep study? Yes (Had sleep study many years ago, states was unable to tolerate CPAP)  If yes, do you have and use a CPAP or BPAP machine every night? 0  Do you snore loudly (loud enough to be heard through closed doors)?  1  Do you often feel tired, fatigued, or sleepy during the daytime? 1  Has anyone observed you stop breathing during your sleep? 1  Do you have, or are you being treated for high blood pressure? 1  BMI more than 35 kg/m2? 1  Age over 43 years old? 0  Gender: 1

## 2014-08-22 ENCOUNTER — Encounter (HOSPITAL_BASED_OUTPATIENT_CLINIC_OR_DEPARTMENT_OTHER)
Admission: RE | Admit: 2014-08-22 | Discharge: 2014-08-22 | Disposition: A | Discharge status: Home / Self Care | Payer: 59 | Source: Ambulatory Visit | Attending: Orthopedic Surgery | Admitting: Orthopedic Surgery

## 2014-08-22 ENCOUNTER — Other Ambulatory Visit: Payer: Self-pay

## 2014-08-22 DIAGNOSIS — Z01818 Encounter for other preprocedural examination: Secondary | ICD-10-CM | POA: Diagnosis present

## 2014-08-22 LAB — BASIC METABOLIC PANEL
Anion gap: 7 (ref 5–15)
BUN: 11 mg/dL (ref 6–23)
CALCIUM: 9.4 mg/dL (ref 8.4–10.5)
CO2: 26 mmol/L (ref 19–32)
Chloride: 106 mmol/L (ref 96–112)
Creatinine, Ser: 1.09 mg/dL (ref 0.50–1.35)
GFR calc non Af Amer: 82 mL/min — ABNORMAL LOW (ref >90)
GLUCOSE: 95 mg/dL (ref 70–99)
Potassium: 4 mmol/L (ref 3.5–5.1)
Sodium: 139 mmol/L (ref 135–145)

## 2014-08-24 ENCOUNTER — Encounter (HOSPITAL_BASED_OUTPATIENT_CLINIC_OR_DEPARTMENT_OTHER): Payer: Self-pay | Admitting: Certified Registered"

## 2014-08-24 MED ORDER — FENTANYL CITRATE 0.05 MG/ML IJ SOLN
INTRAMUSCULAR | Status: AC
  Filled 2014-08-24: qty 6

## 2014-08-24 MED ORDER — MIDAZOLAM HCL 2 MG/2ML IJ SOLN
INTRAMUSCULAR | Status: AC
  Filled 2014-08-24: qty 2

## 2014-09-05 ENCOUNTER — Encounter (HOSPITAL_BASED_OUTPATIENT_CLINIC_OR_DEPARTMENT_OTHER): Payer: Self-pay | Admitting: *Deleted

## 2014-09-07 ENCOUNTER — Encounter (HOSPITAL_BASED_OUTPATIENT_CLINIC_OR_DEPARTMENT_OTHER): Payer: Self-pay | Admitting: Anesthesiology

## 2014-09-07 ENCOUNTER — Ambulatory Visit (HOSPITAL_BASED_OUTPATIENT_CLINIC_OR_DEPARTMENT_OTHER): Payer: 59 | Admitting: Certified Registered"

## 2014-09-07 ENCOUNTER — Ambulatory Visit (HOSPITAL_BASED_OUTPATIENT_CLINIC_OR_DEPARTMENT_OTHER)
Admission: RE | Admit: 2014-09-07 | Discharge: 2014-09-07 | Disposition: A | Discharge status: Home / Self Care | Payer: 59 | Source: Ambulatory Visit | Attending: Orthopedic Surgery | Admitting: Orthopedic Surgery

## 2014-09-07 ENCOUNTER — Encounter (HOSPITAL_BASED_OUTPATIENT_CLINIC_OR_DEPARTMENT_OTHER)
Admission: RE | Disposition: A | Discharge status: Home / Self Care | Payer: Self-pay | Source: Ambulatory Visit | Attending: Orthopedic Surgery

## 2014-09-07 DIAGNOSIS — G4730 Sleep apnea, unspecified: Secondary | ICD-10-CM | POA: Diagnosis not present

## 2014-09-07 DIAGNOSIS — E669 Obesity, unspecified: Secondary | ICD-10-CM | POA: Insufficient documentation

## 2014-09-07 DIAGNOSIS — G5602 Carpal tunnel syndrome, left upper limb: Secondary | ICD-10-CM | POA: Diagnosis not present

## 2014-09-07 DIAGNOSIS — Z6838 Body mass index (BMI) 38.0-38.9, adult: Secondary | ICD-10-CM | POA: Diagnosis not present

## 2014-09-07 DIAGNOSIS — I10 Essential (primary) hypertension: Secondary | ICD-10-CM | POA: Diagnosis not present

## 2014-09-07 DIAGNOSIS — M5136 Other intervertebral disc degeneration, lumbar region: Secondary | ICD-10-CM | POA: Insufficient documentation

## 2014-09-07 DIAGNOSIS — Z87442 Personal history of urinary calculi: Secondary | ICD-10-CM | POA: Diagnosis not present

## 2014-09-07 DIAGNOSIS — M47812 Spondylosis without myelopathy or radiculopathy, cervical region: Secondary | ICD-10-CM | POA: Insufficient documentation

## 2014-09-07 HISTORY — DX: Unspecified osteoarthritis, unspecified site: M19.90

## 2014-09-07 HISTORY — DX: Sleep apnea, unspecified: G47.30

## 2014-09-07 HISTORY — DX: Personal history of urinary calculi: Z87.442

## 2014-09-07 HISTORY — PX: CARPAL TUNNEL RELEASE: SHX101

## 2014-09-07 HISTORY — DX: Other intervertebral disc degeneration, lumbar region without mention of lumbar back pain or lower extremity pain: M51.369

## 2014-09-07 HISTORY — DX: Carpal tunnel syndrome, bilateral upper limbs: G56.03

## 2014-09-07 HISTORY — DX: Essential (primary) hypertension: I10

## 2014-09-07 HISTORY — DX: Other intervertebral disc degeneration, lumbar region: M51.36

## 2014-09-07 SURGERY — CARPAL TUNNEL RELEASE
Anesthesia: General | Site: Wrist | Laterality: Left

## 2014-09-07 MED ORDER — LACTATED RINGERS IV SOLN
INTRAVENOUS | Status: DC
  Administered 2014-09-07: 09:00:00 via INTRAVENOUS

## 2014-09-07 MED ORDER — MIDAZOLAM HCL 2 MG/2ML IJ SOLN
INTRAMUSCULAR | Status: AC
  Filled 2014-09-07: qty 2

## 2014-09-07 MED ORDER — EPHEDRINE SULFATE 50 MG/ML IJ SOLN
INTRAMUSCULAR | Status: DC | PRN
  Administered 2014-09-07: 10 mg via INTRAVENOUS

## 2014-09-07 MED ORDER — FENTANYL CITRATE 0.05 MG/ML IJ SOLN: 50.0000 ug | INTRAMUSCULAR | Status: DC | PRN

## 2014-09-07 MED ORDER — FENTANYL CITRATE (PF) 100 MCG/2ML IJ SOLN
INTRAMUSCULAR | Status: AC
  Filled 2014-09-07: qty 6

## 2014-09-07 MED ORDER — OXYCODONE HCL 5 MG PO TABS: 10.0000 mg | ORAL_TABLET | ORAL | Status: DC | PRN

## 2014-09-07 MED ORDER — BUPIVACAINE-EPINEPHRINE (PF) 0.5% -1:200000 IJ SOLN
INTRAMUSCULAR | Status: AC
  Filled 2014-09-07: qty 30

## 2014-09-07 MED ORDER — CEFAZOLIN SODIUM-DEXTROSE 2-3 GM-% IV SOLR
INTRAVENOUS | Status: DC | PRN
  Administered 2014-09-07: 2 g via INTRAVENOUS

## 2014-09-07 MED ORDER — BUPIVACAINE HCL 0.5 % IJ SOLN
INTRAMUSCULAR | Status: DC | PRN
  Administered 2014-09-07: 8 mL

## 2014-09-07 MED ORDER — MIDAZOLAM HCL 5 MG/5ML IJ SOLN
INTRAMUSCULAR | Status: DC | PRN
Start: 2014-09-07 — End: 2014-09-07
  Administered 2014-09-07: 2 mg via INTRAVENOUS

## 2014-09-07 MED ORDER — MIDAZOLAM HCL 2 MG/ML PO SYRP: 12.0000 mg | ORAL_SOLUTION | Freq: Once | ORAL | Status: DC | PRN

## 2014-09-07 MED ORDER — DEXAMETHASONE SODIUM PHOSPHATE 4 MG/ML IJ SOLN
INTRAMUSCULAR | Status: DC | PRN
Start: 2014-09-07 — End: 2014-09-07
  Administered 2014-09-07: 10 mg via INTRAVENOUS

## 2014-09-07 MED ORDER — OXYCODONE HCL 5 MG/5ML PO SOLN: 5.0000 mg | Freq: Once | ORAL | Status: AC | PRN

## 2014-09-07 MED ORDER — FENTANYL CITRATE (PF) 100 MCG/2ML IJ SOLN
INTRAMUSCULAR | Status: DC | PRN
  Administered 2014-09-07: 50 ug via INTRAVENOUS
  Administered 2014-09-07: 25 ug via INTRAVENOUS
  Administered 2014-09-07 (×2): 50 ug via INTRAVENOUS
  Administered 2014-09-07: 25 ug via INTRAVENOUS

## 2014-09-07 MED ORDER — OXYCODONE HCL 5 MG PO TABS
ORAL_TABLET | ORAL | Status: AC
  Filled 2014-09-07: qty 1

## 2014-09-07 MED ORDER — LIDOCAINE HCL (CARDIAC) 20 MG/ML IV SOLN
INTRAVENOUS | Status: DC | PRN
  Administered 2014-09-07: 50 mg via INTRAVENOUS

## 2014-09-07 MED ORDER — ONDANSETRON HCL 4 MG/2ML IJ SOLN: 4.0000 mg | Freq: Four times a day (QID) | INTRAMUSCULAR | Status: DC | PRN

## 2014-09-07 MED ORDER — PROPOFOL 10 MG/ML IV BOLUS
INTRAVENOUS | Status: DC | PRN
  Administered 2014-09-07: 200 mg via INTRAVENOUS

## 2014-09-07 MED ORDER — FENTANYL CITRATE (PF) 100 MCG/2ML IJ SOLN
INTRAMUSCULAR | Status: AC
  Filled 2014-09-07: qty 2

## 2014-09-07 MED ORDER — MIDAZOLAM HCL 2 MG/2ML IJ SOLN: 1.0000 mg | INTRAMUSCULAR | Status: DC | PRN

## 2014-09-07 MED ORDER — BUPIVACAINE-EPINEPHRINE (PF) 0.25% -1:200000 IJ SOLN
INTRAMUSCULAR | Status: AC
  Filled 2014-09-07: qty 30

## 2014-09-07 MED ORDER — OXYCODONE HCL 5 MG PO TABS
5.0000 mg | ORAL_TABLET | Freq: Once | ORAL | Status: AC | PRN
  Administered 2014-09-07: 5 mg via ORAL

## 2014-09-07 MED ORDER — FENTANYL CITRATE (PF) 100 MCG/2ML IJ SOLN
25.0000 ug | INTRAMUSCULAR | Status: DC | PRN
  Administered 2014-09-07: 50 ug via INTRAVENOUS

## 2014-09-07 SURGICAL SUPPLY — 47 items
BANDAGE ELASTIC 3 VELCRO ST LF (GAUZE/BANDAGES/DRESSINGS) ×2 IMPLANT
BLADE CARPAL TUNNEL SNGL USE (BLADE) ×2 IMPLANT
BLADE SURG 15 STRL LF DISP TIS (BLADE) ×2 IMPLANT
BLADE SURG 15 STRL SS (BLADE) ×2
BNDG CONFORM 3 STRL LF (GAUZE/BANDAGES/DRESSINGS) ×2 IMPLANT
BRUSH SCRUB EZ PLAIN DRY (MISCELLANEOUS) ×2 IMPLANT
CORDS BIPOLAR (ELECTRODE) ×2 IMPLANT
COVER BACK TABLE 60X90IN (DRAPES) ×2 IMPLANT
COVER MAYO STAND STRL (DRAPES) ×2 IMPLANT
CUFF TOURNIQUET SINGLE 18IN (TOURNIQUET CUFF) ×2 IMPLANT
DRAIN PENROSE 1/4X12 LTX STRL (WOUND CARE) IMPLANT
DRAPE EXTREMITY T 121X128X90 (DRAPE) ×2 IMPLANT
DRAPE SURG 17X23 STRL (DRAPES) ×2 IMPLANT
DRSG EMULSION OIL 3X3 NADH (GAUZE/BANDAGES/DRESSINGS) ×2 IMPLANT
GAUZE SPONGE 4X4 12PLY STRL (GAUZE/BANDAGES/DRESSINGS) IMPLANT
GAUZE SPONGE 4X4 16PLY XRAY LF (GAUZE/BANDAGES/DRESSINGS) IMPLANT
GAUZE XEROFORM 1X8 LF (GAUZE/BANDAGES/DRESSINGS) ×2 IMPLANT
GLOVE BIOGEL M STRL SZ7.5 (GLOVE) ×2 IMPLANT
GLOVE BIOGEL PI IND STRL 6.5 (GLOVE) ×2 IMPLANT
GLOVE BIOGEL PI INDICATOR 6.5 (GLOVE) ×2
GLOVE SS BIOGEL STRL SZ 8 (GLOVE) ×1 IMPLANT
GLOVE SUPERSENSE BIOGEL SZ 8 (GLOVE) ×1
GLOVE SURG SS PI 6.5 STRL IVOR (GLOVE) ×4 IMPLANT
GOWN STRL REUS W/ TWL LRG LVL3 (GOWN DISPOSABLE) ×1 IMPLANT
GOWN STRL REUS W/ TWL XL LVL3 (GOWN DISPOSABLE) ×3 IMPLANT
GOWN STRL REUS W/TWL LRG LVL3 (GOWN DISPOSABLE) ×1
GOWN STRL REUS W/TWL XL LVL3 (GOWN DISPOSABLE) ×3
LOOP VESSEL MAXI BLUE (MISCELLANEOUS) IMPLANT
NDL SAFETY ECLIPSE 18X1.5 (NEEDLE) ×1 IMPLANT
NEEDLE HYPO 18GX1.5 SHARP (NEEDLE) ×1
NEEDLE HYPO 22GX1.5 SAFETY (NEEDLE) IMPLANT
NEEDLE HYPO 25X1 1.5 SAFETY (NEEDLE) ×4 IMPLANT
NS IRRIG 1000ML POUR BTL (IV SOLUTION) ×2 IMPLANT
PACK BASIN DAY SURGERY FS (CUSTOM PROCEDURE TRAY) ×2 IMPLANT
PAD ALCOHOL SWAB (MISCELLANEOUS) ×16 IMPLANT
PAD CAST 3X4 CTTN HI CHSV (CAST SUPPLIES) ×2 IMPLANT
PADDING CAST ABS 4INX4YD NS (CAST SUPPLIES) ×1
PADDING CAST ABS COTTON 4X4 ST (CAST SUPPLIES) ×1 IMPLANT
PADDING CAST COTTON 3X4 STRL (CAST SUPPLIES) ×2
STOCKINETTE 4X48 STRL (DRAPES) ×2 IMPLANT
SUT PROLENE 4 0 PS 2 18 (SUTURE) ×2 IMPLANT
SYR BULB 3OZ (MISCELLANEOUS) ×2 IMPLANT
SYR CONTROL 10ML LL (SYRINGE) ×4 IMPLANT
TOWEL OR 17X24 6PK STRL BLUE (TOWEL DISPOSABLE) ×2 IMPLANT
TOWEL OR NON WOVEN STRL DISP B (DISPOSABLE) ×2 IMPLANT
TRAY DSU PREP LF (CUSTOM PROCEDURE TRAY) ×2 IMPLANT
UNDERPAD 30X30 INCONTINENT (UNDERPADS AND DIAPERS) ×2 IMPLANT

## 2014-09-07 NOTE — Discharge Instructions (Signed)
Post Anesthesia Home Care Instructions  Activity: Get plenty of rest for the remainder of the day. A responsible adult should stay with you for 24 hours following the procedure.  For the next 24 hours, DO NOT: -Drive a car -Advertising copywriterperate machinery -Drink alcoholic beverages -Take any medication unless instructed by your physician -Make any legal decisions or sign important papers.  Meals: Start with liquid foods such as gelatin or soup. Progress to regular foods as tolerated. Avoid greasy, spicy, heavy foods. If nausea and/or vomiting occur, drink only clear liquids until the nausea and/or vomiting subsides. Call your physician if vomiting continues.  Special Instructions/Symptoms: Your throat may feel dry or sore from the anesthesia or the breathing tube placed in your throat during surgery. If this causes discomfort, gargle with warm salt water. The discomfort should disappear within 24 hours.  If you had a scopolamine patch placed behind your ear for the management of post- operative nausea and/or vomiting: Keep bandage clean and dry.  Call for any problems.  No smoking.  Criteria for driving a car: you should be off your pain medicine for 7-8 hours, able to drive one handed(confident), thinking clearly and feeling able in your judgement to drive. Continue elevation as it will decrease swelling.  If instructed by MD move your fingers within the confines of the bandage/splint.  Use ice if instructed by your MD. Call immediately for any sudden loss of feeling in your hand/arm or change in functional abilities of the extremity.We recommend that you to take vitamin C 1000 mg a day to promote healing. We also recommend that if you require  pain medicine that you take a stool softener to prevent constipation as most pain medicines will have constipation side effects. We recommend either Peri-Colace or Senokot and recommend that you also consider adding MiraLAX to prevent the constipation affects from  pain medicine if you are required to use them. These medicines are over the counter and maybe purchased at a local pharmacy. A cup of yogurt and a probiotic can also be helpful during the recovery process as the medicines can disrupt your intestinal environment.    We Recommend Vit B6 200 mg a day to promote nerve health 1. The medication in the patch is effective for 72 hours, after which it should be removed.  Wrap patch in a tissue and discard in the trash. Wash hands thoroughly with soap and water. 2. You may remove the patch earlier than 72 hours if you experience unpleasant side effects which may include dry mouth, dizziness or visual disturbances. 3. Avoid touching the patch. Wash your hands with soap and water after contact with the patch.    Post Anesthesia Home Care Instructions  Activity: Get plenty of rest for the remainder of the day. A responsible adult should stay with you for 24 hours following the procedure.  For the next 24 hours, DO NOT: -Drive a car -Advertising copywriterperate machinery -Drink alcoholic beverages -Take any medication unless instructed by your physician -Make any legal decisions or sign important papers.  Meals: Start with liquid foods such as gelatin or soup. Progress to regular foods as tolerated. Avoid greasy, spicy, heavy foods. If nausea and/or vomiting occur, drink only clear liquids until the nausea and/or vomiting subsides. Call your physician if vomiting continues.  Special Instructions/Symptoms: Your throat may feel dry or sore from the anesthesia or the breathing tube placed in your throat during surgery. If this causes discomfort, gargle with warm salt water. The discomfort should disappear  within 24 hours.  If you had a scopolamine patch placed behind your ear for the management of post- operative nausea and/or vomiting:  1. The medication in the patch is effective for 72 hours, after which it should be removed.  Wrap patch in a tissue and discard in the  trash. Wash hands thoroughly with soap and water. 2. You may remove the patch earlier than 72 hours if you experience unpleasant side effects which may include dry mouth, dizziness or visual disturbances. 3. Avoid touching the patch. Wash your hands with soap and water after contact with the patch.

## 2014-09-07 NOTE — Op Note (Signed)
See [id number]#[id number] Amanda PeaGramig MD

## 2014-09-07 NOTE — H&P (Signed)
[name] is an 44 y.o. male.   Chief Complaint: presents for L CTR HPI: Plan for L CTR Patient presents for evaluation and treatment of the of their upper extremity predicament. The patient denies neck, back, chest or  abdominal pain. The patient notes that they have no lower extremity problems. The patients primary complaint is noted. We are planning surgical care pathway for the upper extremity.  Past Medical History  Diagnosis Date  . DDD (degenerative disc disease), lumbar   . Arthritis     neck  . History of kidney stones   . Carpal tunnel syndrome on both sides 08/2014  . Sleep apnea     CPAP but does not use  . Hypertension     under control with med., has been on med. x 1 yr.    Past Surgical History  Procedure Laterality Date  . Knee arthroscopy Left     History reviewed. No pertinent family history. Social History:  reports that he has never smoked. He has never used smokeless tobacco. He reports that he drinks alcohol. He reports that he does not use illicit drugs.  Allergies: No Known Allergies  Medications Prior to Admission  Medication Sig Dispense Refill  . fluticasone (FLONASE) 50 MCG/ACT nasal spray Place into both nostrils daily.    Marland Kitchen. losartan (COZAAR) 50 MG tablet Take 50 mg by mouth daily.      No results found for this or any previous visit (from the past 48 hour(s)). No results found.  Review of Systems  Respiratory: Negative.   Gastrointestinal: Negative.   Genitourinary: Negative.   Neurological: Negative.     Blood pressure 129/86, pulse 76, temperature 98.4 F (36.9 C), temperature source Oral, resp. rate 16, height 6\' 4"  (1.93 m), weight 142.883 kg (315 lb), SpO2 99 %. Physical Exam  L CTS with positive Phalens and Tinels The patient is alert and oriented in no acute distress. The patient complains of pain in the affected upper extremity.  The patient is noted to have a normal HEENT exam. Lung fields show equal chest expansion and no  shortness of breath. Abdomen exam is nontender without distention. Lower extremity examination does not show any fracture dislocation or blood clot symptoms. Pelvis is stable and the neck and back are stable and nontender. Assessment/Plan Plan L CTR We are planning surgery for your upper extremity. The risk and benefits of surgery to include risk of bleeding, infection, anesthesia,  damage to normal structures and failure of the surgery to accomplish its intended goals of relieving symptoms and restoring function have been discussed in detail. With this in mind we plan to proceed. I have specifically discussed with the patient the pre-and postoperative regime and the dos and don'ts and risk and benefits in great detail. Risk and benefits of surgery also include risk of dystrophy(CRPS), chronic nerve pain, failure of the healing process to go onto completion and other inherent risks of surgery The relavent the pathophysiology of the disease/injury process, as well as the alternatives for treatment and postoperative course of action has been discussed in great detail with the patient who desires to proceed.  We will do everything in our power to help you (the patient) restore function to the upper extremity. It is a pleasure to see this patient today.  Brynlee Pennywell III,Marvette Schamp M 09/07/2014, 10:12 AM

## 2014-09-07 NOTE — Anesthesia Preprocedure Evaluation (Signed)
Anesthesia Evaluation  Patient identified by MRN, date of birth, ID band Patient awake    Reviewed: Allergy & Precautions, NPO status , Patient's Chart, lab work & pertinent test results  Airway Mallampati: II   Neck ROM: full    Dental   Pulmonary sleep apnea ,  breath sounds clear to auscultation        Cardiovascular hypertension, Rhythm:regular Rate:Normal     Neuro/Psych  Neuromuscular disease    GI/Hepatic   Endo/Other  obese  Renal/GU      Musculoskeletal  (+) Arthritis -,   Abdominal   Peds  Hematology   Anesthesia Other Findings   Reproductive/Obstetrics                             Anesthesia Physical Anesthesia Plan  ASA: II  Anesthesia Plan: General   Post-op Pain Management:    Induction: Intravenous  Airway Management Planned: LMA  Additional Equipment:   Intra-op Plan:   Post-operative Plan:   Informed Consent: I have reviewed the patients History and Physical, chart, labs and discussed the procedure including the risks, benefits and alternatives for the proposed anesthesia with the patient or authorized representative who has indicated his/her understanding and acceptance.     Plan Discussed with: CRNA, Anesthesiologist and Surgeon  Anesthesia Plan Comments:         Anesthesia Quick Evaluation

## 2014-09-07 NOTE — Anesthesia Postprocedure Evaluation (Signed)
Anesthesia Post Note  Patient: Henry SchwartzMichael J Borsuk  Procedure(s) Performed: Procedure(s) (LRB): LEFT CARPAL TUNNEL RELEASE (Left)  Anesthesia type: General  Patient location: PACU  Post pain: Pain level controlled and Adequate analgesia  Post assessment: Post-op Vital signs reviewed, Patient's Cardiovascular Status Stable, Respiratory Function Stable, Patent Airway and Pain level controlled  Last Vitals:  Filed Vitals:   09/07/14 1220  BP: 150/83  Pulse: 78  Temp: 36.8 C  Resp:     Post vital signs: Reviewed and stable  Level of consciousness: awake, alert  and oriented  Complications: No apparent anesthesia complications

## 2014-09-07 NOTE — Op Note (Deleted)
NAME:  [name], [name]                ACCOUNT NO.:  [id number]  MEDICAL RECORD NO.:  [id number]  LOCATION:                               FACILITY:  [facility]  PHYSICIAN:  Yadiel Aubry M. Yannely Kintzel, M.D.DATE OF BIRTH:  10/23/1970  DATE OF PROCEDURE: DATE OF DISCHARGE:  09/07/2014                              OPERATIVE REPORT   PREOPERATIVE DIAGNOSIS:  Carpal tunnel syndrome, left upper extremity.  POSTOPERATIVE DIAGNOSIS:  Carpal tunnel syndrome, left upper extremity.  PROCEDURE:  Left open carpal tunnel release.  SURGEON:  Alyson Ki M. Keymarion Bearman, M.D.  ASSISTANT:  None.  COMPLICATIONS:  None.  ANESTHESIA:  General.  TOURNIQUET TIME:  Less than 15 minutes.  INDICATIONS:  Mr. [name] is a pleasant 43-year-old male, very thick hand with noted electrodiagnostic and objective findings of carpal tunnel syndrome with failure of conservative management.  He desires to proceed the above-mentioned operative intervention.  All questions have been encouraged and answered preoperatively.  OPERATIVE PROCEDURE:  The patient was seen by myself and Anesthesia, taken to the operative theater and underwent a general anesthetic, was laid supine, fully padded, prepped and draped in usual sterile fashion with Betadine scrub and paint.  Following this, the patient then underwent elevation of the tourniquet.  Final time-out was called.  Arm had been marked and an incision was made over the ulnar aspect of the carpal canal just overlying the distal edge.  This was marked out with visual markings to the eye.  Dissection was carried down.  Palmar fascia was incised.  Following this, the patient underwent release of the distal edge of the transverse carpal ligament.  He was completely released distally.  The fat pad egressed nicely.  The superficial palmar arch was identified and kept out of harm's way.  Following this, distal to proximal dissection was carried out intact, room was available for sliding the canal  prepared toward devices followed by releasing the most proximal leaflet with the iridectomy scissors.  This was all done under direct vision with Sewell and Senn retractor.  These retractors aided in proper evaluation of the release. We did release portions of the antebrachial fascia.  Under direct vision, the proximal leaflet was released.  Following this, we irrigated copiously, inspected the canal and noted that he was nicely released without difficulty.  There were no complicating features.  I was fortunate to release him off the very ulnar ledge to hopefully decrease the risk of palmar pain given this occupation and very large hand girth. The patient tolerated this well.  Irrigation was applied.  Hemostasis was secured.  Wound closed with Prolene and 8 mL of Sensorcaine with epinephrine were placed in wound for postop analgesia 0.5% in nature.  Sterile dressing was applied.  He was taken to the recovery room in stable condition.  He will be monitored in the recovery room and then discharged to home.  Standard postop algorithm will be adhered to.  This was an uncomplicated open carpal tunnel release.     Media Pizzini M. Durinda Buzzelli, M.D.     WMG/MEDQ  D:  09/07/2014  T:  09/07/2014  Job:  [id number] 

## 2014-09-07 NOTE — Transfer of Care (Signed)
Immediate Anesthesia Transfer of Care Note  Patient: Henry SchwartzMichael J Nestor  Procedure(s) Performed: Procedure(s): LEFT CARPAL TUNNEL RELEASE (Left)  Patient Location: PACU  Anesthesia Type:General  Level of Consciousness: awake and patient cooperative  Airway & Oxygen Therapy: Patient Spontanous Breathing and Patient connected to face mask oxygen  Post-op Assessment: Report given to RN and Post -op Vital signs reviewed and stable  Post vital signs: Reviewed and stable  Last Vitals:  Filed Vitals:   09/07/14 0850  BP: 129/86  Pulse: 76  Temp: 36.9 C  Resp: 16    Complications: No apparent anesthesia complications

## 2014-09-07 NOTE — Op Note (Signed)
Henry Pane:  [name], [name]                ACCOUNT NO.:  [id number]  MEDICAL RECORD NO.:  [id number]  LOCATION:                               [facility]  PHYSICIAN:  [name], Henry MoralesDATE OF BIRTH:  1971/03/30  DATE OF PROCEDURE: DATE OF DISCHARGE:  09/07/2014                              OPERATIVE REPORT   PREOPERATIVE DIAGNOSIS:  Carpal tunnel syndrome, left upper extremity.  POSTOPERATIVE DIAGNOSIS:  Carpal tunnel syndrome, left upper extremity.  PROCEDURE:  Left open carpal tunnel release.  SURGEON:  [name], M.D.  ASSISTANT:  None.  COMPLICATIONS:  None.  ANESTHESIA:  General.  TOURNIQUET TIME:  Less than 15 minutes.  INDICATIONS:  [name] is a pleasant 44 year old male, very thick hand with noted electrodiagnostic and objective findings of carpal tunnel syndrome with failure of conservative management.  He desires to proceed the above-mentioned operative intervention.  All questions have been encouraged and answered preoperatively.  OPERATIVE PROCEDURE:  The patient was seen by myself and Anesthesia, taken to the operative theater and underwent a general anesthetic, was laid supine, fully padded, prepped and draped in usual sterile fashion with Betadine scrub and paint.  Following this, the patient then underwent elevation of the tourniquet.  Final time-out was called.  Arm had been marked and an incision was made over the ulnar aspect of the carpal canal just overlying the distal edge.  This was marked out with visual markings to the eye.  Dissection was carried down.  Palmar fascia was incised.  Following this, the patient underwent release of the distal edge of the transverse carpal ligament.  He was completely released distally.  The fat pad egressed nicely.  The superficial palmar arch was identified and kept out of harm's way.  Following this, distal to proximal dissection was carried out intact, room was available for sliding the canal  prepared toward devices followed by releasing the most proximal leaflet with the iridectomy scissors.  This was all done under direct vision with Lorna DibbleSewell and Photographerenn retractor.  These retractors aided in proper evaluation of the release. We did release portions of the antebrachial fascia.  Under direct vision, the proximal leaflet was released.  Following this, we irrigated copiously, inspected the canal and noted that he was nicely released without difficulty.  There were no complicating features.  I was fortunate to release him off the very ulnar ledge to hopefully decrease the risk of palmar pain given this occupation and very large hand girth. The patient tolerated this well.  Irrigation was applied.  Hemostasis was secured.  Wound closed with Prolene and 8 mL of Sensorcaine with epinephrine were placed in wound for postop analgesia 0.5% in nature.  Sterile dressing was applied.  He was taken to the recovery room in stable condition.  He will be monitored in the recovery room and then discharged to home.  Standard postop algorithm will be adhered to.  This was an uncomplicated open carpal tunnel release.     [name], M.D.     [facility]MEDQ  D:  09/07/2014  T:  09/07/2014  Job:  [id number]

## 2014-09-07 NOTE — Anesthesia Procedure Notes (Signed)
Procedure Name: LMA Insertion Date/Time: 09/07/2014 10:34 AM Performed by: Genevieve NorlanderLINKA, Carlene Bickley L Pre-anesthesia Checklist: Patient identified, Emergency Drugs available, Suction available, Patient being monitored and Timeout performed Patient Re-evaluated:Patient Re-evaluated prior to inductionOxygen Delivery Method: Circle System Utilized Preoxygenation: Pre-oxygenation with 100% oxygen Intubation Type: IV induction Ventilation: Mask ventilation without difficulty LMA: LMA inserted LMA Size: 5.0 Number of attempts: 1 Airway Equipment and Method: Bite block Placement Confirmation: positive ETCO2 Tube secured with: Tape Dental Injury: Teeth and Oropharynx as per pre-operative assessment

## 2014-09-10 ENCOUNTER — Encounter (HOSPITAL_BASED_OUTPATIENT_CLINIC_OR_DEPARTMENT_OTHER): Payer: Self-pay | Admitting: Orthopedic Surgery

## 2014-09-26 ENCOUNTER — Other Ambulatory Visit: Payer: Self-pay | Admitting: Orthopedic Surgery

## 2014-10-10 ENCOUNTER — Encounter (HOSPITAL_BASED_OUTPATIENT_CLINIC_OR_DEPARTMENT_OTHER): Payer: Self-pay | Admitting: *Deleted

## 2014-10-12 ENCOUNTER — Encounter (HOSPITAL_BASED_OUTPATIENT_CLINIC_OR_DEPARTMENT_OTHER): Payer: Self-pay | Admitting: Anesthesiology

## 2014-10-12 ENCOUNTER — Ambulatory Visit (HOSPITAL_BASED_OUTPATIENT_CLINIC_OR_DEPARTMENT_OTHER): Payer: 59 | Admitting: Anesthesiology

## 2014-10-12 ENCOUNTER — Ambulatory Visit (HOSPITAL_BASED_OUTPATIENT_CLINIC_OR_DEPARTMENT_OTHER)
Admission: RE | Admit: 2014-10-12 | Discharge: 2014-10-12 | Disposition: A | Discharge status: Home / Self Care | Payer: 59 | Source: Ambulatory Visit | Attending: Orthopedic Surgery | Admitting: Orthopedic Surgery

## 2014-10-12 ENCOUNTER — Encounter (HOSPITAL_BASED_OUTPATIENT_CLINIC_OR_DEPARTMENT_OTHER)
Admission: RE | Disposition: A | Discharge status: Home / Self Care | Payer: Self-pay | Source: Ambulatory Visit | Attending: Orthopedic Surgery

## 2014-10-12 DIAGNOSIS — Z87442 Personal history of urinary calculi: Secondary | ICD-10-CM | POA: Diagnosis not present

## 2014-10-12 DIAGNOSIS — M479 Spondylosis, unspecified: Secondary | ICD-10-CM | POA: Insufficient documentation

## 2014-10-12 DIAGNOSIS — G4730 Sleep apnea, unspecified: Secondary | ICD-10-CM | POA: Diagnosis not present

## 2014-10-12 DIAGNOSIS — G5601 Carpal tunnel syndrome, right upper limb: Secondary | ICD-10-CM | POA: Insufficient documentation

## 2014-10-12 DIAGNOSIS — I10 Essential (primary) hypertension: Secondary | ICD-10-CM | POA: Diagnosis not present

## 2014-10-12 DIAGNOSIS — Z6838 Body mass index (BMI) 38.0-38.9, adult: Secondary | ICD-10-CM | POA: Diagnosis not present

## 2014-10-12 DIAGNOSIS — M5136 Other intervertebral disc degeneration, lumbar region: Secondary | ICD-10-CM | POA: Diagnosis not present

## 2014-10-12 HISTORY — PX: CARPAL TUNNEL RELEASE: SHX101

## 2014-10-12 LAB — POCT HEMOGLOBIN-HEMACUE: Hemoglobin: 15.3 g/dL (ref 13.0–17.0)

## 2014-10-12 SURGERY — CARPAL TUNNEL RELEASE
Anesthesia: General | Site: Hand | Laterality: Right

## 2014-10-12 MED ORDER — ONDANSETRON HCL 4 MG/2ML IJ SOLN: 4.0000 mg | Freq: Four times a day (QID) | INTRAMUSCULAR | Status: DC | PRN

## 2014-10-12 MED ORDER — KETOROLAC TROMETHAMINE 30 MG/ML IJ SOLN
INTRAMUSCULAR | Status: DC | PRN
  Administered 2014-10-12: 30 mg via INTRAVENOUS

## 2014-10-12 MED ORDER — CEFAZOLIN SODIUM 1-5 GM-% IV SOLN
INTRAVENOUS | Status: AC
  Filled 2014-10-12: qty 50

## 2014-10-12 MED ORDER — FENTANYL CITRATE (PF) 100 MCG/2ML IJ SOLN
INTRAMUSCULAR | Status: DC | PRN
  Administered 2014-10-12: 25 ug via INTRAVENOUS
  Administered 2014-10-12: 50 ug via INTRAVENOUS

## 2014-10-12 MED ORDER — MIDAZOLAM HCL 2 MG/2ML IJ SOLN
INTRAMUSCULAR | Status: AC
  Filled 2014-10-12: qty 2

## 2014-10-12 MED ORDER — FENTANYL CITRATE (PF) 100 MCG/2ML IJ SOLN
25.0000 ug | INTRAMUSCULAR | Status: DC | PRN
  Administered 2014-10-12: 50 ug via INTRAVENOUS

## 2014-10-12 MED ORDER — OXYCODONE HCL 5 MG PO TABS
5.0000 mg | ORAL_TABLET | Freq: Once | ORAL | Status: AC | PRN
  Administered 2014-10-12: 5 mg via ORAL

## 2014-10-12 MED ORDER — DEXTROSE 5 % IV SOLN
3.0000 g | INTRAVENOUS | Status: AC
  Administered 2014-10-12: 3 g via INTRAVENOUS

## 2014-10-12 MED ORDER — ONDANSETRON 8 MG PO TBDP: 8.0000 mg | ORAL_TABLET | Freq: Three times a day (TID) | ORAL | Status: DC | PRN

## 2014-10-12 MED ORDER — PROPOFOL 10 MG/ML IV BOLUS
INTRAVENOUS | Status: DC | PRN
  Administered 2014-10-12: 250 mg via INTRAVENOUS

## 2014-10-12 MED ORDER — ONDANSETRON HCL 4 MG/2ML IJ SOLN
INTRAMUSCULAR | Status: DC | PRN
  Administered 2014-10-12: 4 mg via INTRAVENOUS

## 2014-10-12 MED ORDER — CEFAZOLIN SODIUM-DEXTROSE 2-3 GM-% IV SOLR
INTRAVENOUS | Status: AC
  Filled 2014-10-12: qty 50

## 2014-10-12 MED ORDER — LACTATED RINGERS IV SOLN
INTRAVENOUS | Status: DC
  Administered 2014-10-12 (×2): via INTRAVENOUS

## 2014-10-12 MED ORDER — OXYCODONE HCL 5 MG/5ML PO SOLN: 5.0000 mg | Freq: Once | ORAL | Status: AC | PRN

## 2014-10-12 MED ORDER — FENTANYL CITRATE (PF) 100 MCG/2ML IJ SOLN
INTRAMUSCULAR | Status: AC
  Filled 2014-10-12: qty 2

## 2014-10-12 MED ORDER — FENTANYL CITRATE (PF) 100 MCG/2ML IJ SOLN: 50.0000 ug | INTRAMUSCULAR | Status: DC | PRN

## 2014-10-12 MED ORDER — MIDAZOLAM HCL 2 MG/2ML IJ SOLN
1.0000 mg | INTRAMUSCULAR | Status: DC | PRN
  Administered 2014-10-12: 2 mg via INTRAVENOUS

## 2014-10-12 MED ORDER — DEXAMETHASONE SODIUM PHOSPHATE 4 MG/ML IJ SOLN
INTRAMUSCULAR | Status: DC | PRN
  Administered 2014-10-12: 10 mg via INTRAVENOUS

## 2014-10-12 MED ORDER — GLYCOPYRROLATE 0.2 MG/ML IJ SOLN: 0.2000 mg | Freq: Once | INTRAMUSCULAR | Status: DC | PRN

## 2014-10-12 MED ORDER — OXYCODONE-ACETAMINOPHEN 5-325 MG PO TABS: 2.0000 | ORAL_TABLET | ORAL | Status: DC | PRN

## 2014-10-12 MED ORDER — CHLORHEXIDINE GLUCONATE 4 % EX LIQD
60.0000 mL | Freq: Once | CUTANEOUS | Status: DC
Start: 2014-10-12 — End: 2014-10-12

## 2014-10-12 MED ORDER — LIDOCAINE HCL (CARDIAC) 20 MG/ML IV SOLN
INTRAVENOUS | Status: DC | PRN
  Administered 2014-10-12: 50 mg via INTRAVENOUS

## 2014-10-12 MED ORDER — OXYCODONE HCL 5 MG PO TABS
ORAL_TABLET | ORAL | Status: AC
  Filled 2014-10-12: qty 1

## 2014-10-12 SURGICAL SUPPLY — 49 items
BANDAGE ELASTIC 3 VELCRO ST LF (GAUZE/BANDAGES/DRESSINGS) ×2 IMPLANT
BLADE CARPAL TUNNEL SNGL USE (BLADE) ×2 IMPLANT
BLADE SURG 15 STRL LF DISP TIS (BLADE) ×2 IMPLANT
BLADE SURG 15 STRL SS (BLADE) ×2
BNDG CONFORM 3 STRL LF (GAUZE/BANDAGES/DRESSINGS) ×2 IMPLANT
BRUSH SCRUB EZ PLAIN DRY (MISCELLANEOUS) ×2 IMPLANT
CORDS BIPOLAR (ELECTRODE) ×2 IMPLANT
COVER BACK TABLE 60X90IN (DRAPES) ×2 IMPLANT
COVER MAYO STAND STRL (DRAPES) ×2 IMPLANT
CUFF TOURNIQUET SINGLE 18IN (TOURNIQUET CUFF) IMPLANT
CUFF TOURNIQUET SINGLE 24IN (TOURNIQUET CUFF) ×2 IMPLANT
DRAIN PENROSE 1/4X12 LTX STRL (WOUND CARE) IMPLANT
DRAPE EXTREMITY T 121X128X90 (DRAPE) ×2 IMPLANT
DRAPE SURG 17X23 STRL (DRAPES) ×2 IMPLANT
DRSG EMULSION OIL 3X3 NADH (GAUZE/BANDAGES/DRESSINGS) ×2 IMPLANT
GAUZE SPONGE 4X4 12PLY STRL (GAUZE/BANDAGES/DRESSINGS) ×2 IMPLANT
GAUZE SPONGE 4X4 16PLY XRAY LF (GAUZE/BANDAGES/DRESSINGS) IMPLANT
GAUZE XEROFORM 1X8 LF (GAUZE/BANDAGES/DRESSINGS) ×2 IMPLANT
GLOVE BIO SURGEON STRL SZ 6.5 (GLOVE) ×4 IMPLANT
GLOVE BIOGEL M STRL SZ7.5 (GLOVE) IMPLANT
GLOVE BIOGEL PI IND STRL 7.0 (GLOVE) ×2 IMPLANT
GLOVE BIOGEL PI INDICATOR 7.0 (GLOVE) ×2
GLOVE EXAM NITRILE EXT CUFF MD (GLOVE) ×2 IMPLANT
GLOVE SS BIOGEL STRL SZ 8 (GLOVE) ×1 IMPLANT
GLOVE SUPERSENSE BIOGEL SZ 8 (GLOVE) ×1
GOWN STRL REUS W/ TWL LRG LVL3 (GOWN DISPOSABLE) ×2 IMPLANT
GOWN STRL REUS W/ TWL XL LVL3 (GOWN DISPOSABLE) ×1 IMPLANT
GOWN STRL REUS W/TWL LRG LVL3 (GOWN DISPOSABLE) ×2
GOWN STRL REUS W/TWL XL LVL3 (GOWN DISPOSABLE) ×1
LOOP VESSEL MAXI BLUE (MISCELLANEOUS) IMPLANT
NDL SAFETY ECLIPSE 18X1.5 (NEEDLE) IMPLANT
NEEDLE HYPO 18GX1.5 SHARP (NEEDLE)
NEEDLE HYPO 22GX1.5 SAFETY (NEEDLE) IMPLANT
NEEDLE HYPO 25X1 1.5 SAFETY (NEEDLE) ×2 IMPLANT
NS IRRIG 1000ML POUR BTL (IV SOLUTION) ×2 IMPLANT
PACK BASIN DAY SURGERY FS (CUSTOM PROCEDURE TRAY) ×2 IMPLANT
PAD ALCOHOL SWAB (MISCELLANEOUS) IMPLANT
PAD CAST 3X4 CTTN HI CHSV (CAST SUPPLIES) ×1 IMPLANT
PADDING CAST ABS 4INX4YD NS (CAST SUPPLIES) ×1
PADDING CAST ABS COTTON 4X4 ST (CAST SUPPLIES) ×1 IMPLANT
PADDING CAST COTTON 3X4 STRL (CAST SUPPLIES) ×1
STOCKINETTE 4X48 STRL (DRAPES) ×2 IMPLANT
SUT PROLENE 4 0 PS 2 18 (SUTURE) ×2 IMPLANT
SYR BULB 3OZ (MISCELLANEOUS) ×2 IMPLANT
SYR CONTROL 10ML LL (SYRINGE) ×2 IMPLANT
TOWEL OR 17X24 6PK STRL BLUE (TOWEL DISPOSABLE) ×2 IMPLANT
TOWEL OR NON WOVEN STRL DISP B (DISPOSABLE) ×2 IMPLANT
TRAY DSU PREP LF (CUSTOM PROCEDURE TRAY) ×2 IMPLANT
UNDERPAD 30X30 (UNDERPADS AND DIAPERS) ×2 IMPLANT

## 2014-10-12 NOTE — H&P (Signed)
[name] is an 44 y.o. male.   Chief Complaint: patient presents for right carpal tunnel release HPI: patient presents for right carpal tunnel release he denies other issues. His left carpal tunnel has previously been released and is doing beautifully Patient presents for evaluation and treatment of the of their upper extremity predicament. The patient denies neck, back, chest or  abdominal pain. The patient notes that they have no lower extremity problems. The patients primary complaint is noted. We are planning surgical care pathway for the upper extremity. Past Medical History  Diagnosis Date  . DDD (degenerative disc disease), lumbar   . Arthritis     neck  . History of kidney stones   . Carpal tunnel syndrome on both sides 08/2014  . Sleep apnea     CPAP but does not use  . Hypertension     under control with med., has been on med. x 1 yr.    Past Surgical History  Procedure Laterality Date  . Knee arthroscopy Left   . Carpal tunnel release Left 09/07/2014    Procedure: LEFT CARPAL TUNNEL RELEASE;  Surgeon: Dominica SeverinWilliam Ashika Apuzzo, MD;  Location: [facility];  Service: Orthopedics;  Laterality: Left;    History reviewed. No pertinent family history. Social History:  reports that he has never smoked. He has never used smokeless tobacco. He reports that he drinks alcohol. He reports that he does not use illicit drugs.  Allergies: No Known Allergies  Medications Prior to Admission  Medication Sig Dispense Refill  . Ascorbic Acid (VITAMIN C) 1000 MG tablet Take 1,000 mg by mouth daily.    . fluticasone (FLONASE) 50 MCG/ACT nasal spray Place into both nostrils daily.    Marland Kitchen. losartan (COZAAR) 50 MG tablet Take 50 mg by mouth daily.    . Pyridoxine HCl (VITAMIN B-6) 500 MG tablet Take 500 mg by mouth daily.      No results found for this or any previous visit (from the past 48 hour(s)). No results found.  Review of Systems  Respiratory: Negative.   Genitourinary:  Negative.   Neurological: Negative.   Psychiatric/Behavioral: Negative.     Blood pressure 134/78, pulse 73, temperature 98.3 F (36.8 C), temperature source Oral, resp. rate 20, height 6\' 4"  (1.93 m), weight 142.486 kg (314 lb 2 oz). Physical Exam positive nocturnal paresthesias subjectively. Positive Tinel's Phalen's and median nerve compression test. No signs of infection or dystrophy  The patient is alert and oriented in no acute distress. The patient complains of pain in the affected upper extremity.  The patient is noted to have a normal HEENT exam. Lung fields show equal chest expansion and no shortness of breath. Abdomen exam is nontender without distention. Lower extremity examination does not show any fracture dislocation or blood clot symptoms. Pelvis is stable and the neck and back are stable and nontender.  Assessment/Plan Plan right limited open carpal tunnel release is necessary. He understands risks and benefits timeframe duration of recovery and other issues as they are germane to his upper extremity predicament  We are planning surgery for your upper extremity. The risk and benefits of surgery to include risk of bleeding, infection, anesthesia,  damage to normal structures and failure of the surgery to accomplish its intended goals of relieving symptoms and restoring function have been discussed in detail. With this in mind we plan to proceed. I have specifically discussed with the patient the pre-and postoperative regime and the dos and don'ts and risk and  benefits in great detail. Risk and benefits of surgery also include risk of dystrophy(CRPS), chronic nerve pain, failure of the healing process to go onto completion and other inherent risks of surgery The relavent the pathophysiology of the disease/injury process, as well as the alternatives for treatment and postoperative course of action has been discussed in great detail with the patient who desires to proceed.  We  will do everything in our power to help you (the patient) restore function to the upper extremity. It is a pleasure to see this patient today.  Karen Chafe 10/12/2014, 10:55 AM

## 2014-10-12 NOTE — Op Note (Signed)
See dictation#[id number] Amanda PeaGramig MD

## 2014-10-12 NOTE — Anesthesia Preprocedure Evaluation (Signed)
Anesthesia Evaluation  Patient identified by MRN, date of birth, ID band Patient awake    Reviewed: Allergy & Precautions, NPO status , Patient's Chart, lab work & pertinent test results  Airway Mallampati: II   Neck ROM: full    Dental   Pulmonary sleep apnea ,  breath sounds clear to auscultation        Cardiovascular hypertension, Rhythm:regular Rate:Normal     Neuro/Psych  Neuromuscular disease    GI/Hepatic   Endo/Other  Morbid obesity  Renal/GU      Musculoskeletal  (+) Arthritis -,   Abdominal   Peds  Hematology   Anesthesia Other Findings   Reproductive/Obstetrics                             Anesthesia Physical Anesthesia Plan  ASA: II  Anesthesia Plan: General   Post-op Pain Management:    Induction: Intravenous  Airway Management Planned: LMA  Additional Equipment:   Intra-op Plan:   Post-operative Plan:   Informed Consent: I have reviewed the patients History and Physical, chart, labs and discussed the procedure including the risks, benefits and alternatives for the proposed anesthesia with the patient or authorized representative who has indicated his/her understanding and acceptance.     Plan Discussed with: CRNA, Anesthesiologist and Surgeon  Anesthesia Plan Comments:         Anesthesia Quick Evaluation

## 2014-10-12 NOTE — Anesthesia Postprocedure Evaluation (Signed)
Anesthesia Post Note  Patient: Henry SchwartzMichael J Grisso  Procedure(s) Performed: Procedure(s) (LRB): RIGHT CARPAL TUNNEL RELEASE (Right)  Anesthesia type: General  Patient location: PACU  Post pain: Pain level controlled and Adequate analgesia  Post assessment: Post-op Vital signs reviewed, Patient's Cardiovascular Status Stable, Respiratory Function Stable, Patent Airway and Pain level controlled  Last Vitals:  Filed Vitals:   10/12/14 1300  BP: 120/70  Pulse: 71  Temp: 36.6 C  Resp: 18    Post vital signs: Reviewed and stable  Level of consciousness: awake, alert  and oriented  Complications: No apparent anesthesia complications

## 2014-10-12 NOTE — Transfer of Care (Signed)
Immediate Anesthesia Transfer of Care Note  Patient: Henry SchwartzMichael J [name]  Procedure(s) Performed: Procedure(s): RIGHT CARPAL TUNNEL RELEASE (Right)  Patient Location: PACU  Anesthesia Type:General  Level of Consciousness: awake and sedated  Airway & Oxygen Therapy: Patient Spontanous Breathing and Patient connected to face mask oxygen  Post-op Assessment: Report given to RN and Post -op Vital signs reviewed and stable  Post vital signs: Reviewed and stable  Last Vitals:  Filed Vitals:   10/12/14 1002  BP: 134/78  Pulse: 73  Temp: 36.8 C  Resp: 20    Complications: No apparent anesthesia complications

## 2014-10-12 NOTE — Anesthesia Procedure Notes (Signed)
Procedure Name: LMA Insertion Performed by: Wreatha Sturgeon W Pre-anesthesia Checklist: Patient identified, Timeout performed, Emergency Drugs available, Suction available and Patient being monitored Patient Re-evaluated:Patient Re-evaluated prior to inductionOxygen Delivery Method: Circle system utilized Preoxygenation: Pre-oxygenation with 100% oxygen Intubation Type: IV induction Ventilation: Mask ventilation without difficulty LMA: LMA inserted LMA Size: 5.0 Tube type: Oral Number of attempts: 1 Placement Confirmation: positive ETCO2 and breath sounds checked- equal and bilateral Tube secured with: Tape Dental Injury: Teeth and Oropharynx as per pre-operative assessment      

## 2014-10-12 NOTE — Discharge Instructions (Signed)
We recommend that you to take vitamin C 1000 mg a day to promote healing. We recommend vitamin B6 200 mg a day to promote nerve health We also recommend that if you require  pain medicine that you take a stool softener to prevent constipation as most pain medicines will have constipation side effects. We recommend either Peri-Colace or Senokot and recommend that you also consider adding MiraLAX to prevent the constipation affects from pain medicine if you are required to use them. These medicines are over the counter and maybe purchased at a local pharmacy. A cup of yogurt and a probiotic can also be helpful during the recovery process as the medicines can disrupt your intestinal environment. Keep bandage clean and dry.  Call for any problems.  No smoking.  Criteria for driving a car: you should be off your pain medicine for 7-8 hours, able to drive one handed(confident), thinking clearly and feeling able in your judgement to drive. Continue elevation as it will decrease swelling.  If instructed by MD move your fingers within the confines of the bandage/splint.  Use ice if instructed by your MD. Call immediately for any sudden loss of feeling in your hand/arm or change in functional abilities of the extremity.   Post Anesthesia Home Care Instructions  Activity: Get plenty of rest for the remainder of the day. A responsible adult should stay with you for 24 hours following the procedure.  For the next 24 hours, DO NOT: -Drive a car -Advertising copywriterperate machinery -Drink alcoholic beverages -Take any medication unless instructed by your physician -Make any legal decisions or sign important papers.  Meals: Start with liquid foods such as gelatin or soup. Progress to regular foods as tolerated. Avoid greasy, spicy, heavy foods. If nausea and/or vomiting occur, drink only clear liquids until the nausea and/or vomiting subsides. Call your physician if vomiting continues.  Special Instructions/Symptoms: Your  throat may feel dry or sore from the anesthesia or the breathing tube placed in your throat during surgery. If this causes discomfort, gargle with warm salt water. The discomfort should disappear within 24 hours.  If you had a scopolamine patch placed behind your ear for the management of post- operative nausea and/or vomiting:  1. The medication in the patch is effective for 72 hours, after which it should be removed.  Wrap patch in a tissue and discard in the trash. Wash hands thoroughly with soap and water. 2. You may remove the patch earlier than 72 hours if you experience unpleasant side effects which may include dry mouth, dizziness or visual disturbances. 3. Avoid touching the patch. Wash your hands with soap and water after contact with the patch.

## 2014-10-16 ENCOUNTER — Encounter (HOSPITAL_BASED_OUTPATIENT_CLINIC_OR_DEPARTMENT_OTHER): Payer: Self-pay | Admitting: Orthopedic Surgery

## 2014-10-16 NOTE — Op Note (Signed)
NAMJenne Pane:  Heacox, Jarren                ACCOUNT NO.:  [id number]  MEDICAL RECORD NO.:  [id number]  LOCATION:                               FACILITY:  [facility]  PHYSICIAN:  Dionne AnoWilliam M. My Madariaga, M.D.DATE OF BIRTH:  05/02/1971  DATE OF PROCEDURE:  10/12/2014 DATE OF DISCHARGE:  10/12/2014                              OPERATIVE REPORT   PREOPERATIVE DIAGNOSIS:  Right carpal tunnel syndrome.  POSTOPERATIVE DIAGNOSIS:  Right carpal tunnel syndrome.  PROCEDURE:  Right open carpal tunnel release.  SURGEON:  Dionne AnoWilliam M. Amanda PeaGramig, M.D.  ASSISTANT:  None.  COMPLICATIONS:  None.  ANESTHESIA:  General.  TOURNIQUET TIME:  Less than 15 minutes.  INDICATIONS:  Mr. Renato GailsReed is a 44 year old male, with significant carpal tunnel symptomatology.  I have counseled in regard to risks and benefits of surgery, and he desires to proceed.  OPERATIVE PROCEDURE:  The patient was seen by myself and Anesthesia.  He was given preoperative antibiotics without complicating feature. Prepped and draped in usual sterile fashion, Betadine scrub and paint. General anesthesia was nicely secured.  Body parts well padded and the arm was elevated and tourniquet was insufflated.  A small 1.5 cm incision was made distally to the transcarpal ligament and proximally. He tolerated this well.  Following this, we performed incision of the palmar fascia and the distal edge of the transverse carpal ligament was released without difficulty.  Fat pad egressed nicely.  There were no complicating features.  Full release distally was accomplished with aggression of the fat pad.  He had a very tight carpal tunnel with impressive transverse carpal ligament thickness.  Distal-to-proximal dissection was carried out __________ available for dilators followed by placement of security clip and following adequate placement of the security clip without complicating feature.  The security knife was placed and security clip effectively releasing the  proximal leaflet and portions of the antebrachial fascia, which he tolerated well.  I inspected the canal.  The median nerve was hyperemic, intact, and without iatrogenic injury.  He was irrigated.  Hemostasis was secured and following this, the patient had the wound closed with interrupted Prolene.  He was dressed with Adaptic, Xeroform, 4x4's, gauze, and standard postop carpal tunnel dressing.  He will return to see us in 7 days.  I am going to place him on Percocet and Zofran p.r.n. nausea. Should any problems occur, he will notify me.  It was an uncomplicated carpal tunnel release.  Hopefully, he will do very smoothly with this in his postop course.     Dionne AnoWilliam M. Amanda PeaGramig, M.D.   ______________________________ Dionne AnoWilliam M. Amanda PeaGramig, M.D.    [facility] Center[facility]/MEDQ  D:  10/12/2014  T:  10/13/2014  Job:  [id number]

## 2015-04-10 ENCOUNTER — Encounter (HOSPITAL_COMMUNITY): Payer: Self-pay | Admitting: Emergency Medicine

## 2015-04-10 ENCOUNTER — Emergency Department (HOSPITAL_COMMUNITY)
Admission: EM | Admit: 2015-04-10 | Discharge: 2015-04-10 | Disposition: A | Discharge status: Home / Self Care | Payer: Commercial Managed Care - HMO | Source: Home / Self Care

## 2015-04-10 DIAGNOSIS — F410 Panic disorder [episodic paroxysmal anxiety] without agoraphobia: Secondary | ICD-10-CM

## 2015-04-10 DIAGNOSIS — F430 Acute stress reaction: Principal | ICD-10-CM

## 2015-04-10 NOTE — ED Notes (Signed)
[facility] a [facility]. The patient stated that he had a "panic attack" at work and the RN evaluated him and [facility]

## 2015-04-10 NOTE — Discharge Instructions (Signed)
Panic Attacks °Panic attacks are sudden, short feelings of great fear or discomfort. You may have them for no reason when you are relaxed, when you are uneasy (anxious), or when you are sleeping.  °HOME CARE °· Take all your medicines as told. °· Check with your doctor before starting new medicines. °· Keep all doctor visits. °GET HELP IF: °· You are not able to take your medicines as told. °· Your symptoms do not get better. °· Your symptoms get worse. °GET HELP RIGHT AWAY IF: °· Your attacks seem different than your normal attacks. °· You have thoughts about hurting yourself or others. °· You take panic attack medicine and you have a side effect. °MAKE SURE YOU: °· Understand these instructions. °· Will watch your condition. °· Will get help right away if you are not doing well or get worse. °  °This information is not intended to replace advice given to you by your health care provider. Make sure you discuss any questions you have with your health care provider. °  °Document Released: 06/06/2010 Document Revised: 02/22/2013 Document Reviewed: 12/16/2012 °Elsevier Interactive Patient Education ©2016 [employer] ° °

## 2015-04-10 NOTE — ED Provider Notes (Signed)
CSN: [id number]     Arrival date & time 04/10/15  1703 History   None    Chief Complaint  Patient presents with  . Panic Attack   (Consider location/radiation/quality/duration/timing/severity/associated sxs/prior Treatment) HPI History obtained from patient:   LOCATION:chest SEVERITY:0 DURATION:minutes CONTEXT: at work, a bit stressed, onset of rapid breathing, then a bit of chest pain.  QUALITY: like previous panic attacks MODIFYING FACTORS:rest, relaxation, oxygen ASSOCIATED SYMPTOMS: felt wound up TIMING:resolved OCCUPATION:pipe fitter  Past Medical History  Diagnosis Date  . DDD (degenerative disc disease), lumbar   . Arthritis     neck  . History of kidney stones   . Carpal tunnel syndrome on both sides 08/2014  . Sleep apnea     CPAP but does not use  . Hypertension     under control with med., has been on med. x 1 yr.   Past Surgical History  Procedure Laterality Date  . Knee arthroscopy Left   . Carpal tunnel release Left 09/07/2014    Procedure: LEFT CARPAL TUNNEL RELEASE;  Surgeon: [name], MD;  Location: [facility];  Service: Orthopedics;  Laterality: Left;  . Carpal tunnel release Right 10/12/2014    Procedure: RIGHT CARPAL TUNNEL RELEASE;  Surgeon: [name], MD;  Location: [facility];  Service: Orthopedics;  Laterality: Right;   History reviewed. No pertinent family history. Social History  Substance Use Topics  . Smoking status: Never Smoker   . Smokeless tobacco: Never Used  . Alcohol Use: Yes     Comment: occasionally    Review of Systems ROS +'ve short of breath, chest tightness  Denies: HEADACHE, NAUSEA, ABDOMINAL PAIN, CONGESTION, DYSURIA,    Allergies  Review of patient's allergies indicates no known allergies.  Home Medications   Prior to Admission medications   Medication Sig Start Date End Date Taking? Authorizing Provider  atorvastatin (LIPITOR) 10 MG tablet Take 10 mg by mouth daily.   Yes  Historical Provider, MD  Ascorbic Acid (VITAMIN C) 1000 MG tablet Take 1,000 mg by mouth daily.    Historical Provider, MD  fluticasone (FLONASE) 50 MCG/ACT nasal spray Place into both nostrils daily.    Historical Provider, MD  losartan (COZAAR) 50 MG tablet Take 50 mg by mouth daily.    Historical Provider, MD  ondansetron (ZOFRAN ODT) 8 MG disintegrating tablet Take 1 tablet (8 mg total) by mouth every 8 (eight) hours as needed for nausea or vomiting. 10/12/14   [name], MD  oxyCODONE-acetaminophen (ROXICET) 5-325 MG per tablet Take 2 tablets by mouth every 4 (four) hours as needed for severe pain. 10/12/14   [name], MD  Pyridoxine HCl (VITAMIN B-6) 500 MG tablet Take 500 mg by mouth daily.    Historical Provider, MD   Meds Ordered and Administered this Visit  Medications - No data to display  BP 135/85 mmHg  Pulse 83  Temp(Src) 97.8 F (36.6 C) (Oral)  Resp 18  SpO2 97% No data found.   Physical Exam NURSES NOTES AND VITAL SIGNS REVIEWED. CONSTITUTIONAL: Well developed, well nourished, no acute distress HEENT: normocephalic, atraumatic EYES: Conjunctiva normal NECK:normal ROM, supple PULMONARY:No respiratory distress, normal effort, Lungs: CTAb/l CARDIOVASCULAR: RRR, no murmur ABDOMEN: soft, ND, NT, +'ve BS MUSCULOSKELETAL: Normal ROM of all extremities SKIN: warm and dry without rash PSYCHIATRIC: Mood and affect normal  ED Course  Procedures (including critical care time)  Labs Review Labs Reviewed - No data to display  Imaging Review No results found.  Visual Acuity Review  Right Eye Distance:   Left Eye Distance:   Bilateral Distance:    Right Eye Near:   Left Eye Near:    Bilateral Near:         MDM   1. Panic attack as reaction to stress    Independent review of his  EKG shows a normal sinus rhythm without acute changes. Atrial rate 79 with normal intervals. Suggested increasing stress at work, no new medications are recommended as  panic attacks are infrequent. Patient should follow-up with his primary care provider. Instructions of care provided discharged home in stable condition return to work note is provided.    Tharon AquasFrank C Claire Dolores, PA 04/10/15 [phone]

## 2015-11-02 ENCOUNTER — Encounter (HOSPITAL_COMMUNITY): Payer: Self-pay | Admitting: Family Medicine

## 2015-11-02 ENCOUNTER — Emergency Department (HOSPITAL_COMMUNITY): Payer: Commercial Managed Care - HMO

## 2015-11-02 ENCOUNTER — Emergency Department (HOSPITAL_COMMUNITY)
Admission: EM | Admit: 2015-11-02 | Discharge: 2015-11-02 | Disposition: A | Discharge status: Home / Self Care | Payer: Commercial Managed Care - HMO | Attending: Emergency Medicine | Admitting: Emergency Medicine

## 2015-11-02 DIAGNOSIS — I10 Essential (primary) hypertension: Secondary | ICD-10-CM | POA: Diagnosis not present

## 2015-11-02 DIAGNOSIS — R109 Unspecified abdominal pain: Secondary | ICD-10-CM | POA: Diagnosis not present

## 2015-11-02 DIAGNOSIS — Z79899 Other long term (current) drug therapy: Secondary | ICD-10-CM | POA: Insufficient documentation

## 2015-11-02 DIAGNOSIS — N200 Calculus of kidney: Secondary | ICD-10-CM | POA: Insufficient documentation

## 2015-11-02 LAB — CBC WITH DIFFERENTIAL/PLATELET
BASOS PCT: 1 %
Basophils Absolute: 0 10*3/uL (ref 0.0–0.1)
Eosinophils Absolute: 0.1 10*3/uL (ref 0.0–0.7)
Eosinophils Relative: 2 %
HEMATOCRIT: 45.2 % (ref 39.0–52.0)
HEMOGLOBIN: 15.8 g/dL (ref 13.0–17.0)
LYMPHS ABS: 1.2 10*3/uL (ref 0.7–4.0)
Lymphocytes Relative: 27 %
MCH: 32 pg (ref 26.0–34.0)
MCHC: 35 g/dL (ref 30.0–36.0)
MCV: 91.7 fL (ref 78.0–100.0)
MONOS PCT: 9 %
Monocytes Absolute: 0.4 10*3/uL (ref 0.1–1.0)
NEUTROS ABS: 2.7 10*3/uL (ref 1.7–7.7)
NEUTROS PCT: 61 %
Platelets: 196 10*3/uL (ref 150–400)
RBC: 4.93 MIL/uL (ref 4.22–5.81)
RDW: 12.4 % (ref 11.5–15.5)
WBC: 4.5 10*3/uL (ref 4.0–10.5)

## 2015-11-02 LAB — URINALYSIS, ROUTINE W REFLEX MICROSCOPIC
Bilirubin Urine: NEGATIVE
Glucose, UA: NEGATIVE mg/dL
Hgb urine dipstick: NEGATIVE
Ketones, ur: NEGATIVE mg/dL
Leukocytes, UA: NEGATIVE
NITRITE: NEGATIVE
PH: 6 (ref 5.0–8.0)
Protein, ur: NEGATIVE mg/dL
SPECIFIC GRAVITY, URINE: 1.023 (ref 1.005–1.030)

## 2015-11-02 LAB — COMPREHENSIVE METABOLIC PANEL
ALT: 39 U/L (ref 17–63)
ANION GAP: 6 (ref 5–15)
AST: 27 U/L (ref 15–41)
Albumin: 3.9 g/dL (ref 3.5–5.0)
Alkaline Phosphatase: 80 U/L (ref 38–126)
BUN: 10 mg/dL (ref 6–20)
CHLORIDE: 106 mmol/L (ref 101–111)
CO2: 26 mmol/L (ref 22–32)
CREATININE: 1.16 mg/dL (ref 0.61–1.24)
Calcium: 9.4 mg/dL (ref 8.9–10.3)
GFR calc Af Amer: >60 mL/min (ref >60)
GFR calc non Af Amer: >60 mL/min (ref >60)
Glucose, Bld: 82 mg/dL (ref 65–99)
POTASSIUM: 4 mmol/L (ref 3.5–5.1)
SODIUM: 138 mmol/L (ref 135–145)
Total Bilirubin: 0.6 mg/dL (ref 0.3–1.2)
Total Protein: 7 g/dL (ref 6.5–8.1)

## 2015-11-02 IMAGING — CT CT RENAL STONE PROTOCOL
2 of 4 series · 17 of 46 positions shown, 19 images · non-contrast
Comparison: CT abdomen pelvis [DATE]

CLINICAL DATA: Patient with left flank pain since last night.

EXAM:
CT ABDOMEN AND PELVIS WITHOUT CONTRAST
TECHNIQUE: Multidetector CT imaging of the abdomen and pelvis was performed
following the standard protocol without IV contrast.

[Series 2: renal stone 5mm · axial · 0.98mm/px · z∈[-344,+101]mm · 14 of 97 slices shown, 16 images]
[im 4/97  soft-tissue]
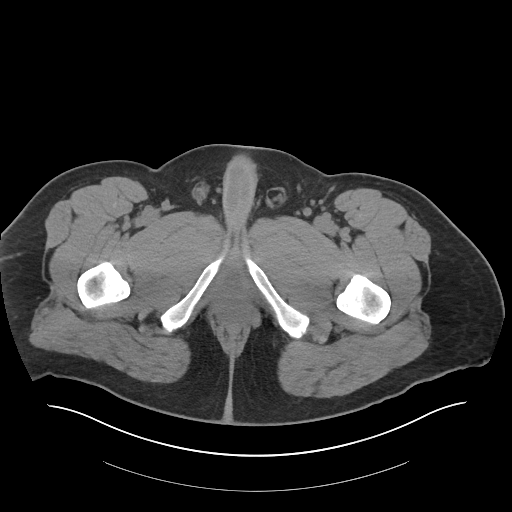
[im 4/97  bone]
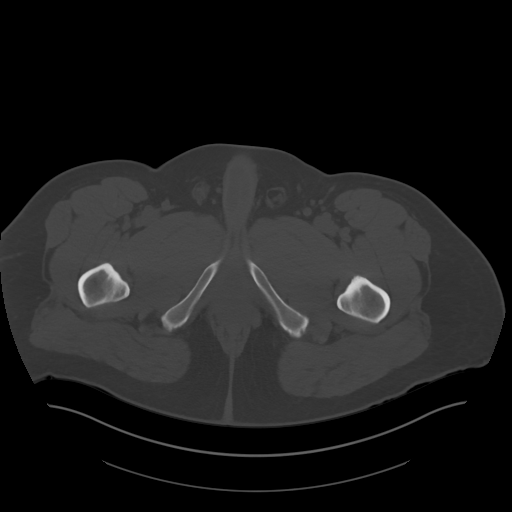
[im 12/97  soft-tissue]
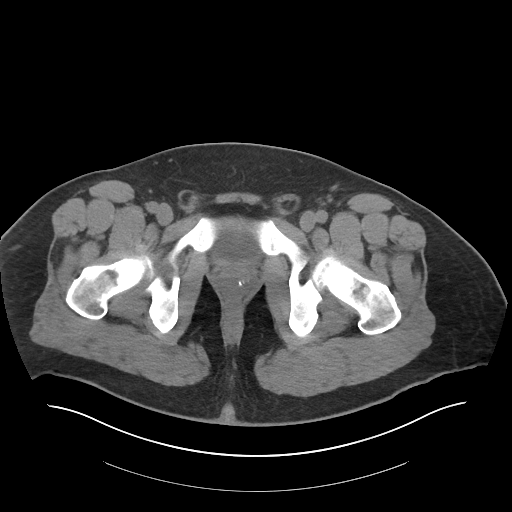
[im 20/97  soft-tissue]
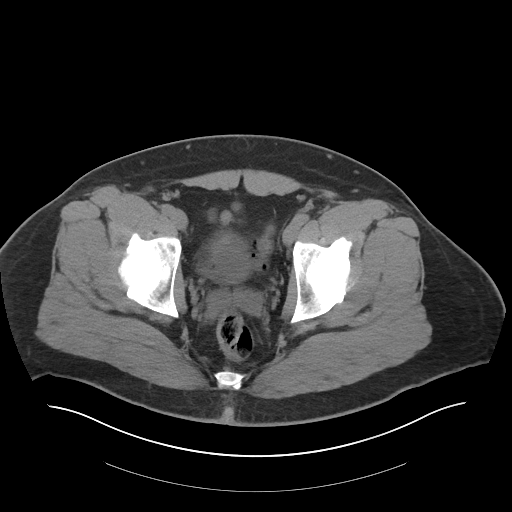
[im 27/97  soft-tissue]
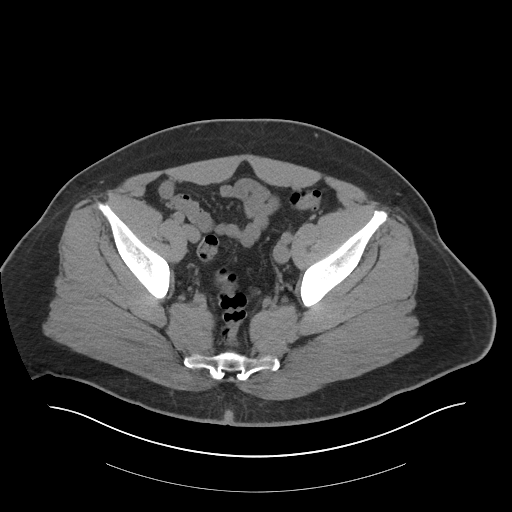
[im 31/97  soft-tissue]
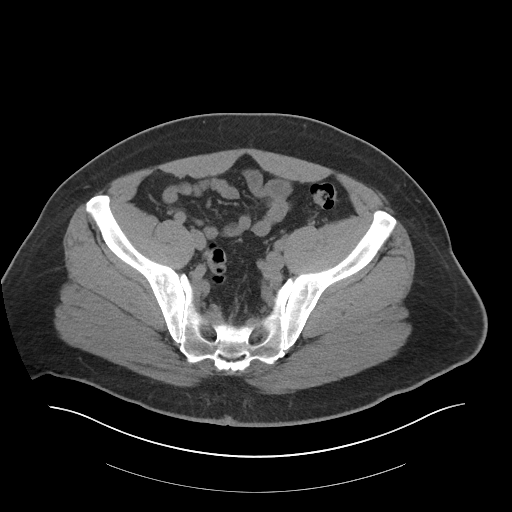
[im 39/97  soft-tissue]
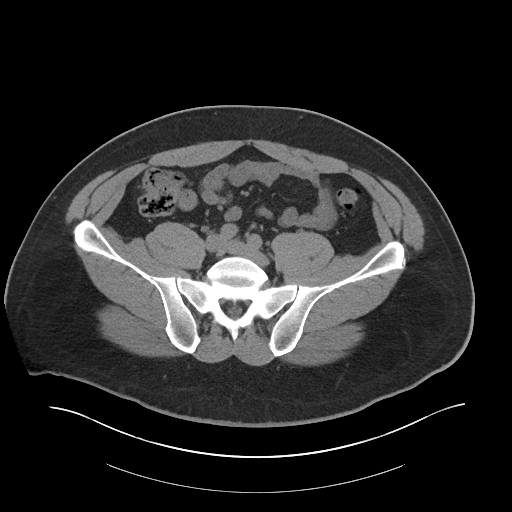
[im 47/97  soft-tissue]
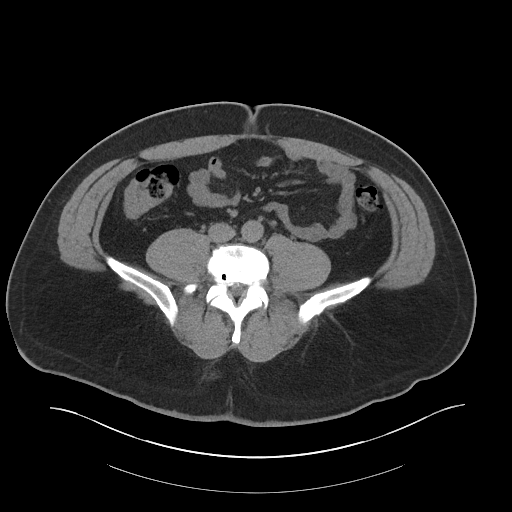
[im 50/97  soft-tissue]
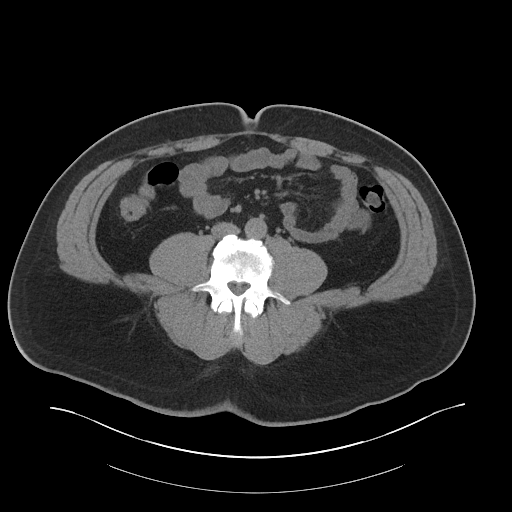
[im 58/97  soft-tissue]
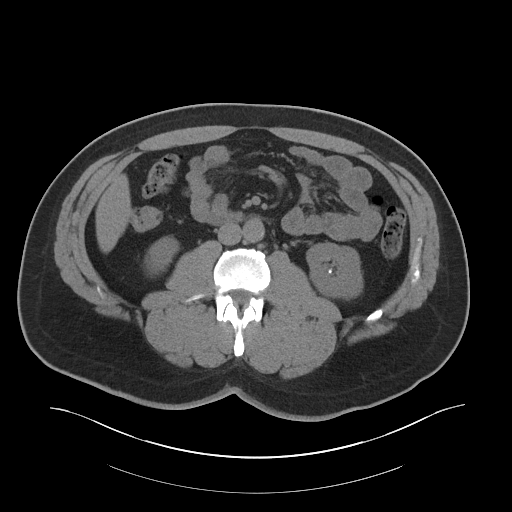
[im 58/97  bone]
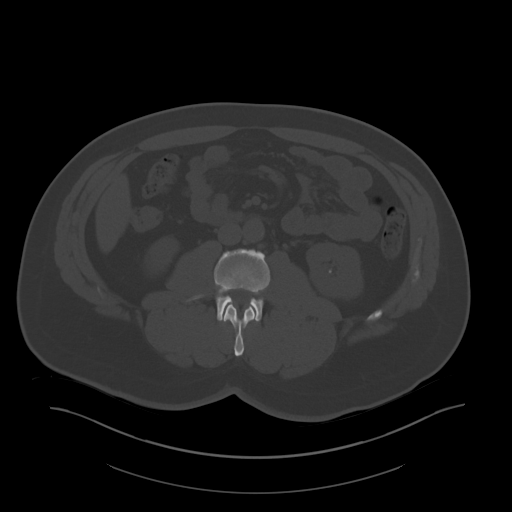
[im 66/97  soft-tissue]
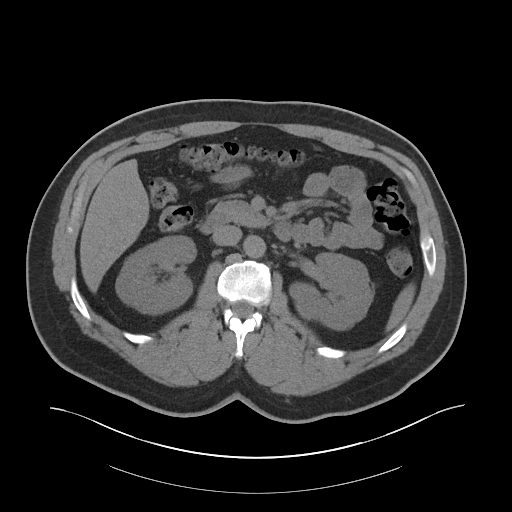
[im 73/97  soft-tissue]
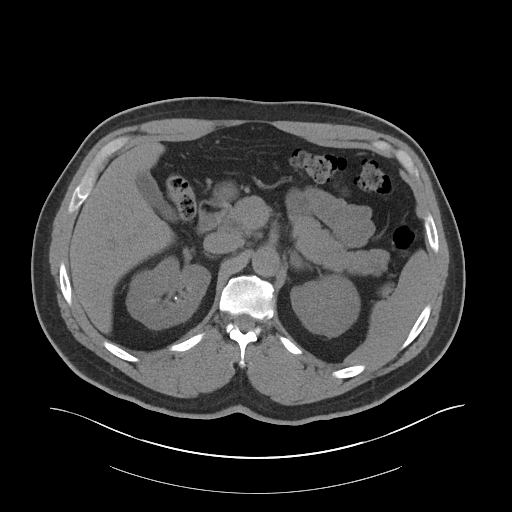
[im 77/97  soft-tissue]
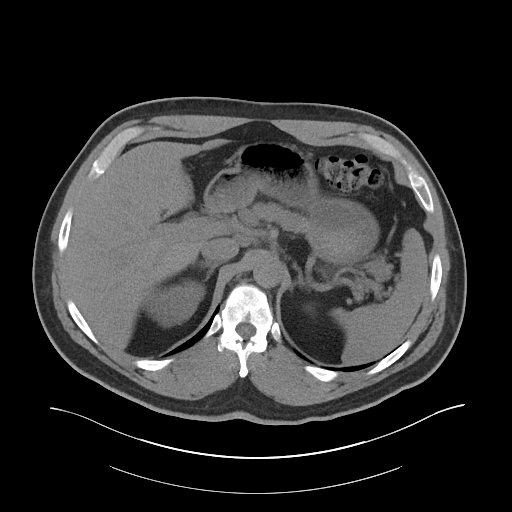
[im 85/97  soft-tissue]
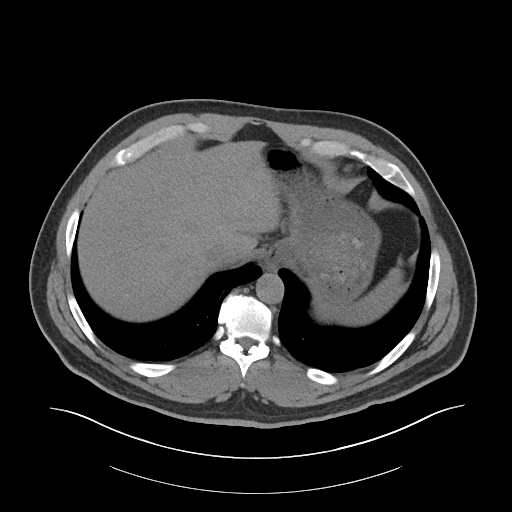
[im 93/97  soft-tissue]
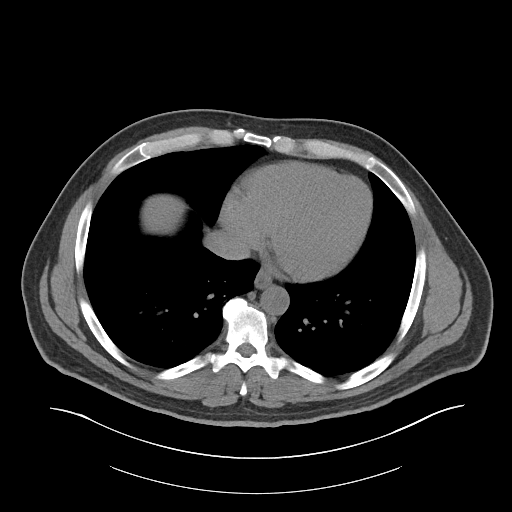

[Series 4: renal stone 3.0 cor · coronal · 0.93mm/px · 3 of 101 slices shown]
[im 34/101  soft-tissue]
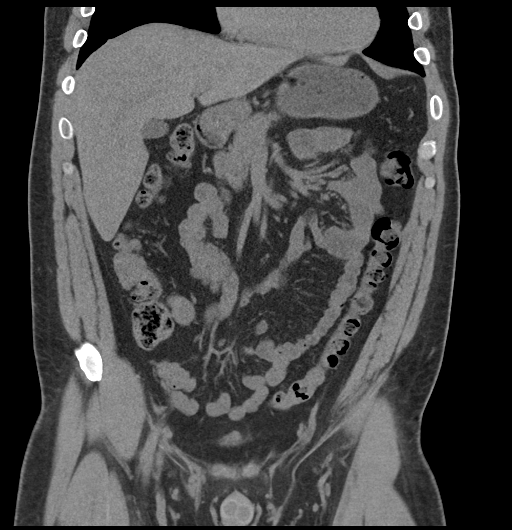
[im 45/101  soft-tissue]
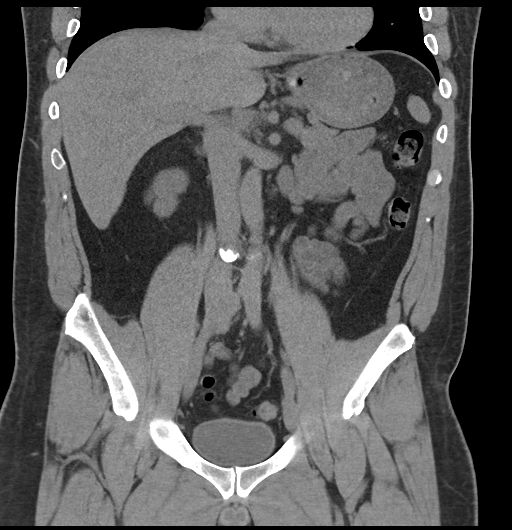
[im 56/101  soft-tissue]
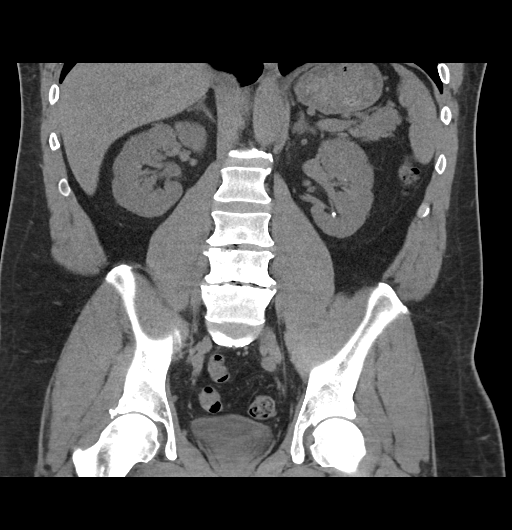

[17 of 46 positions shown; findings below may reference images not displayed]

FINDINGS: Lower chest: Normal heart size. Dependent atelectasis within the
bilateral lower lobes. No pleural effusion.

Hepatobiliary: Liver is normal in size and contour. Gallbladder is
unremarkable.

Pancreas: Unremarkable

Spleen: Unremarkable

Adrenals/Urinary Tract: Stable small left adrenal adenoma measuring
approximately 1.4 cm. Right adrenal gland is normal. Multiple tiny
bilateral 1-3 mm renal stones are demonstrated. No hydronephrosis.
No ureterolithiasis. Urinary bladder is unremarkable.

Stomach/Bowel: No abnormal bowel wall thickening or evidence for
bowel obstruction. The appendix is normal. No free fluid or free
intraperitoneal air.

Vascular/Lymphatic: Normal caliber abdominal aorta. No
retroperitoneal lymphadenopathy.

Other: Small fat containing inguinal hernias bilaterally. Central
dystrophic calcifications in the prostate.

Musculoskeletal: Lower thoracic and lumbar spine degenerative
changes.
IMPRESSION: Bilateral nonobstructing nephrolithiasis. No hydronephrosis. No
ureterolithiasis.

No acute process within the abdomen.

## 2015-11-02 MED ORDER — IBUPROFEN 800 MG PO TABS: 800.0000 mg | ORAL_TABLET | Freq: Three times a day (TID) | ORAL | Status: DC

## 2015-11-02 MED ORDER — SODIUM CHLORIDE 0.9 % IV BOLUS (SEPSIS)
1000.0000 mL | Freq: Once | INTRAVENOUS | Status: AC
  Administered 2015-11-02: 1000 mL via INTRAVENOUS

## 2015-11-02 MED ORDER — KETOROLAC TROMETHAMINE 30 MG/ML IJ SOLN
30.0000 mg | Freq: Once | INTRAMUSCULAR | Status: AC
  Administered 2015-11-02: 30 mg via INTRAVENOUS
  Filled 2015-11-02: qty 1

## 2015-11-02 NOTE — ED Notes (Signed)
Pt returns from ct  

## 2015-11-02 NOTE — ED Notes (Signed)
Patient transported to CT 

## 2015-11-02 NOTE — Discharge Instructions (Signed)
Follow-up with your primary care provider on Monday regarding your visit to the emergency department today. You're CT scan revealed a stable small left adrenal adenoma measuring 1.4 cm. You have multiple tiny 1-9mm kidney stones in your kidneys. Follow-up with the primary care provider regarding your CT scan results.  Use the ibuprofen as needed for pain. Drink plenty of water. Try to drink at least 8-10 8 ounce glasses of water per day.  Return to emergency department if you experience worsening flank pain, blood in your urine, nausea, vomiting, fever, chills, decreased urine.   Kidney Stones Kidney stones (urolithiasis) are deposits that form inside your kidneys. The intense pain is caused by the stone moving through the urinary tract. When the stone moves, the ureter goes into spasm around the stone. The stone is usually passed in the urine.  CAUSES   A disorder that makes certain neck glands produce too much parathyroid hormone (primary hyperparathyroidism).  A buildup of uric acid crystals, similar to gout in your joints.  Narrowing (stricture) of the ureter.  A kidney obstruction present at birth (congenital obstruction).  Previous surgery on the kidney or ureters.  Numerous kidney infections. SYMPTOMS   Feeling sick to your stomach (nauseous).  Throwing up (vomiting).  Blood in the urine (hematuria).  Pain that usually spreads (radiates) to the groin.  Frequency or urgency of urination. DIAGNOSIS   Taking a history and physical exam.  Blood or urine tests.  CT scan.  Occasionally, an examination of the inside of the urinary bladder (cystoscopy) is performed. TREATMENT   Observation.  Increasing your fluid intake.  Extracorporeal shock wave lithotripsy--This is a noninvasive procedure that uses shock waves to break up kidney stones.  Surgery may be needed if you have severe pain or persistent obstruction. There are various surgical procedures. Most of the  procedures are performed with the use of small instruments. Only small incisions are needed to accommodate these instruments, so recovery time is minimized. The size, location, and chemical composition are all important variables that will determine the proper choice of action for you. Talk to your health care provider to better understand your situation so that you will minimize the risk of injury to yourself and your kidney.  HOME CARE INSTRUCTIONS   Drink enough water and fluids to keep your urine clear or pale yellow. This will help you to pass the stone or stone fragments.  Strain all urine through the provided strainer. Keep all particulate matter and stones for your health care provider to see. The stone causing the pain may be as small as a grain of salt. It is very important to use the strainer each and every time you pass your urine. The collection of your stone will allow your health care provider to analyze it and verify that a stone has actually passed. The stone analysis will often identify what you can do to reduce the incidence of recurrences.  Only take over-the-counter or prescription medicines for pain, discomfort, or fever as directed by your health care provider.  Keep all follow-up visits as told by your health care provider. This is important.  Get follow-up X-rays if required. The absence of pain does not always mean that the stone has passed. It may have only stopped moving. If the urine remains completely obstructed, it can cause loss of kidney function or even complete destruction of the kidney. It is your responsibility to make sure X-rays and follow-ups are completed. Ultrasounds of the kidney can show blockages  and the status of the kidney. Ultrasounds are not associated with any radiation and can be performed easily in a matter of minutes.  Make changes to your daily diet as told by your health care provider. You may be told to:  Limit the amount of salt that you  eat.  Eat 5 or more servings of fruits and vegetables each day.  Limit the amount of meat, poultry, fish, and eggs that you eat.  Collect a 24-hour urine sample as told by your health care provider.You may need to collect another urine sample every 6-12 months. SEEK MEDICAL CARE IF:  You experience pain that is progressive and unresponsive to any pain medicine you have been prescribed. SEEK IMMEDIATE MEDICAL CARE IF:   Pain cannot be controlled with the prescribed medicine.  You have a fever or shaking chills.  The severity or intensity of pain increases over 18 hours and is not relieved by pain medicine.  You develop a new onset of abdominal pain.  You feel faint or pass out.  You are unable to urinate.   This information is not intended to replace advice given to you by your health care provider. Make sure you discuss any questions you have with your health care provider.   Document Released: 05/04/2005 Document Revised: 01/23/2015 Document Reviewed: 10/05/2012 Elsevier Interactive Patient Education [employer]2016 Elsevier Inc.

## 2015-11-02 NOTE — ED Notes (Signed)
Pt states he understands instructions. Home ambulatory with steady gait.

## 2015-11-02 NOTE — ED Notes (Signed)
Pt here for flank pain since last night. sts it eased off some and he went to work this am and the pain returned.

## 2015-11-02 NOTE — ED Provider Notes (Signed)
CSN: [id number]     Arrival date & time 11/02/15  1018 History   First MD Initiated Contact with Patient 11/02/15 1033     Chief Complaint  Patient presents with  . Nephrolithiasis     (Consider location/radiation/quality/duration/timing/severity/associated sxs/prior Treatment) HPI   Patient is a 45 year old male with a history of kidney stones, HTN, DDD who presents the ED with left flank pain that began today at 0500. Pain in the left flank is sharp, nonradiating, 10/10 with associated sweating and nausea. He had another episode today at 0930. He currently states his pain is more of a discomfort, 5/10, with associated one episode of difficulty urinating. He has not taken anything for the pain. He states his pain is very similar to the last time he had a kidney stone. Patient denies abdominal pain, vomiting, diarrhea, dysuria, hematuria, testicular or penile swelling or pain, chest pain, shortness of breath, dizziness, syncope.  Past Medical History  Diagnosis Date  . DDD (degenerative disc disease), lumbar   . Arthritis     neck  . History of kidney stones   . Carpal tunnel syndrome on both sides 08/2014  . Sleep apnea     CPAP but does not use  . Hypertension     under control with med., has been on med. x 1 yr.   Past Surgical History  Procedure Laterality Date  . Knee arthroscopy Left   . Carpal tunnel release Left 09/07/2014    Procedure: LEFT CARPAL TUNNEL RELEASE;  Surgeon: [name], MD;  Location: [facility];  Service: Orthopedics;  Laterality: Left;  . Carpal tunnel release Right 10/12/2014    Procedure: RIGHT CARPAL TUNNEL RELEASE;  Surgeon: [name], MD;  Location: [facility];  Service: Orthopedics;  Laterality: Right;   History reviewed. No pertinent family history. Social History  Substance Use Topics  . Smoking status: Never Smoker   . Smokeless tobacco: Never Used  . Alcohol Use: Yes     Comment: occasionally    Review  of Systems  Constitutional: Negative for fever.  Respiratory: Negative for chest tightness and shortness of breath.   Cardiovascular: Negative for chest pain.  Gastrointestinal: Positive for nausea. Negative for vomiting, abdominal pain and diarrhea.  Genitourinary: Positive for flank pain and difficulty urinating. Negative for dysuria, hematuria, penile swelling, scrotal swelling, penile pain and testicular pain.  Musculoskeletal: Negative for back pain.  Skin: Negative for rash.  Allergic/Immunologic: Negative for immunocompromised state.  Neurological: Negative for dizziness, syncope, weakness and headaches.      Allergies  Review of patient's allergies indicates no known allergies.  Home Medications   Prior to Admission medications   Medication Sig Start Date End Date Taking? Authorizing Provider  atorvastatin (LIPITOR) 10 MG tablet Take 10 mg by mouth daily.   Yes Historical Provider, MD  fluticasone (FLONASE) 50 MCG/ACT nasal spray Place into both nostrils daily.   Yes Historical Provider, MD  losartan (COZAAR) 50 MG tablet Take 50 mg by mouth daily.   Yes Historical Provider, MD  Pyridoxine HCl (VITAMIN B-6) 500 MG tablet Take 500 mg by mouth daily.   Yes Historical Provider, MD  ibuprofen (ADVIL,MOTRIN) 800 MG tablet Take 1 tablet (800 mg total) by mouth 3 (three) times daily. 11/02/15   Jessica L Focht, PA   BP 126/88 mmHg  Pulse 67  Temp(Src) 97.8 F (36.6 C)  Resp 18  SpO2 95% Physical Exam  Constitutional: He appears well-developed and well-nourished. No distress.  HENT:  Head: Normocephalic and atraumatic.  Eyes: Conjunctivae are normal.  Neck: Normal range of motion.  Cardiovascular: Normal rate, regular rhythm and normal heart sounds.  Exam reveals no gallop and no friction rub.   No murmur heard. Pulses:      Dorsalis pedis pulses are 2+ on the right side, and 2+ on the left side.  Pulmonary/Chest: Effort normal and breath sounds normal. No respiratory  distress. He has no wheezes. He has no rales.  Abdominal: Soft. Bowel sounds are normal. He exhibits no distension. There is no tenderness. There is no rigidity, no rebound, no guarding and no CVA tenderness.  Flank pain not reproducible on exam  Musculoskeletal: Normal range of motion. He exhibits no edema.  Neurological: He is alert. Coordination normal.  Skin: Skin is warm and dry.  Psychiatric: He has a normal mood and affect. His behavior is normal.  Nursing note and vitals reviewed.   ED Course  Procedures (including critical care time) Labs Review Labs Reviewed  COMPREHENSIVE METABOLIC PANEL  CBC WITH DIFFERENTIAL/PLATELET  URINALYSIS, ROUTINE W REFLEX MICROSCOPIC (NOT AT [facility])    Imaging Review Ct Renal Stone Study  11/02/2015  CLINICAL DATA:  Patient with left flank pain since last night. EXAM: CT ABDOMEN AND PELVIS WITHOUT CONTRAST TECHNIQUE: Multidetector CT imaging of the abdomen and pelvis was performed following the standard protocol without IV contrast. COMPARISON:  CT abdomen pelvis 03/16/2013 FINDINGS: Lower chest: Normal heart size. Dependent atelectasis within the bilateral lower lobes. No pleural effusion. Hepatobiliary: Liver is normal in size and contour. Gallbladder is unremarkable. Pancreas: Unremarkable Spleen: Unremarkable Adrenals/Urinary Tract: Stable small left adrenal adenoma measuring approximately 1.4 cm. Right adrenal gland is normal. Multiple tiny bilateral 1-3 mm renal stones are demonstrated. No hydronephrosis. No ureterolithiasis. Urinary bladder is unremarkable. Stomach/Bowel: No abnormal bowel wall thickening or evidence for bowel obstruction. The appendix is normal. No free fluid or free intraperitoneal air. Vascular/Lymphatic: Normal caliber abdominal aorta. No retroperitoneal lymphadenopathy. Other: Small fat containing inguinal hernias bilaterally. Central dystrophic calcifications in the prostate. Musculoskeletal: Lower thoracic and lumbar spine  degenerative changes. IMPRESSION: Bilateral nonobstructing nephrolithiasis. No hydronephrosis. No ureterolithiasis. No acute process within the abdomen. Electronically Signed   By: Annia Beltrew  Davis M.D.   On: 11/02/2015 12:19   I have personally reviewed and evaluated these images and lab results as part of my medical decision-making.   EKG Interpretation None      MDM   Final diagnoses:  Nephrolithiasis    Pt has been diagnosed with a Kidney Stone via CT. There is no evidence of significant hydronephrosis, serum creatine WNL, labs unremarkable, vitals sign stable and the pt does not have irratractable vomiting. Pt will be dc home with pain medications & has been advised to follow up with PCP. Discussed CT results with the patient to include the small left adrenal adenoma and instructed him to follow-up with his primary care provider regarding this. I discussed strict return precautions with the patient. He expressed understanding to the discharge instructions.     Jerre SimonJessica L Focht, PA 11/02/15 1258  Alvira MondayErin Schlossman, MD 11/03/15 [id number]

## 2016-07-01 DIAGNOSIS — I10 Essential (primary) hypertension: Secondary | ICD-10-CM | POA: Diagnosis not present

## 2016-07-01 DIAGNOSIS — E7800 Pure hypercholesterolemia, unspecified: Secondary | ICD-10-CM | POA: Diagnosis not present

## 2016-07-01 DIAGNOSIS — G4733 Obstructive sleep apnea (adult) (pediatric): Secondary | ICD-10-CM | POA: Diagnosis not present

## 2016-10-05 IMAGING — US US RENAL
1 series · 14 of 25 positions shown · non-contrast
Comparison: CT urogram [DATE]

CLINICAL DATA: Left flank pain for 2 weeks, worse today.

EXAM:
RENAL / URINARY TRACT ULTRASOUND COMPLETE

[Series 1: us renal · 0.31mm/px · 14 of 27 slices shown]
[im 1/27]
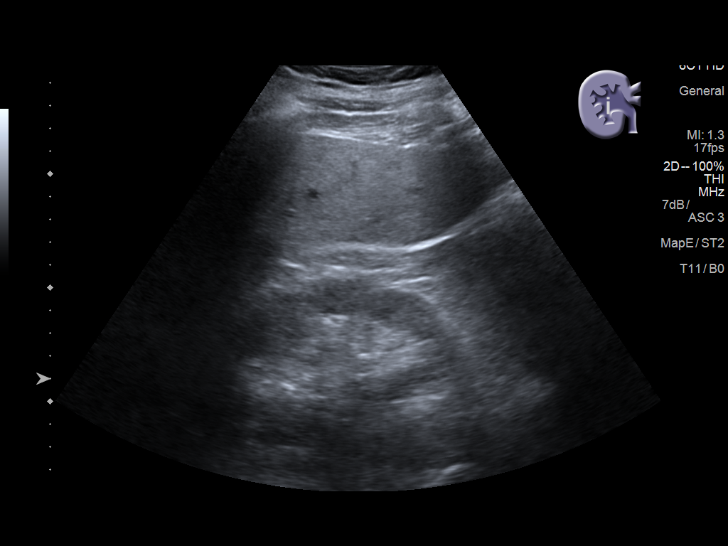
[im 3/27]
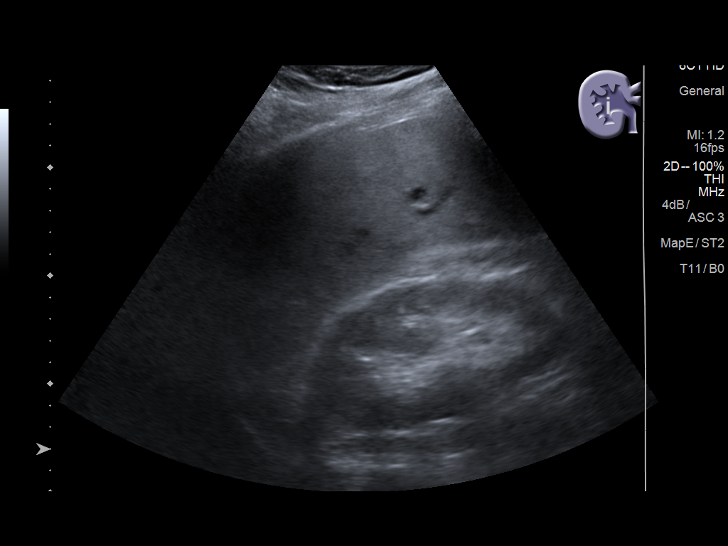
[im 5/27]
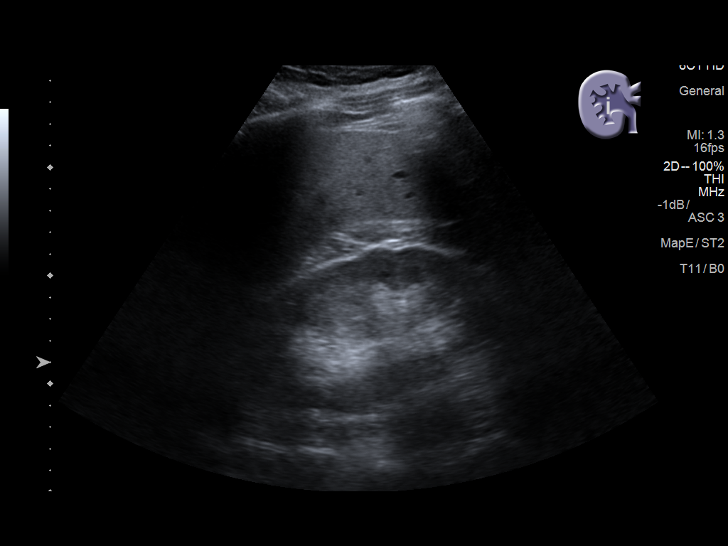
[im 7/27]
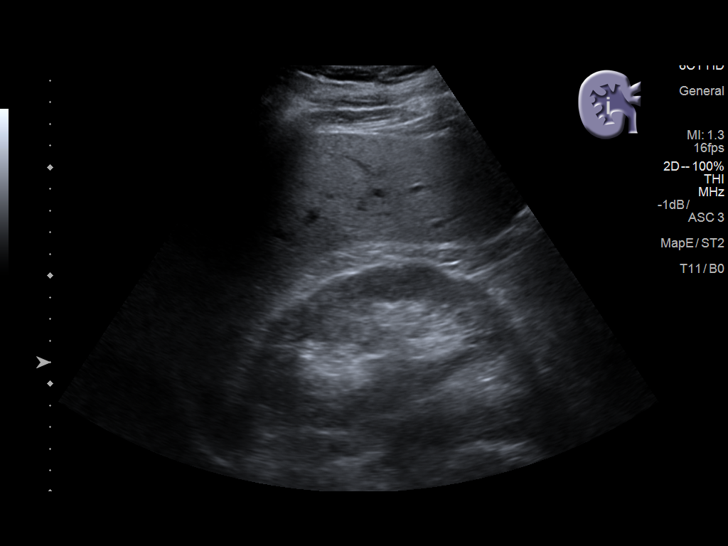
[im 9/27]
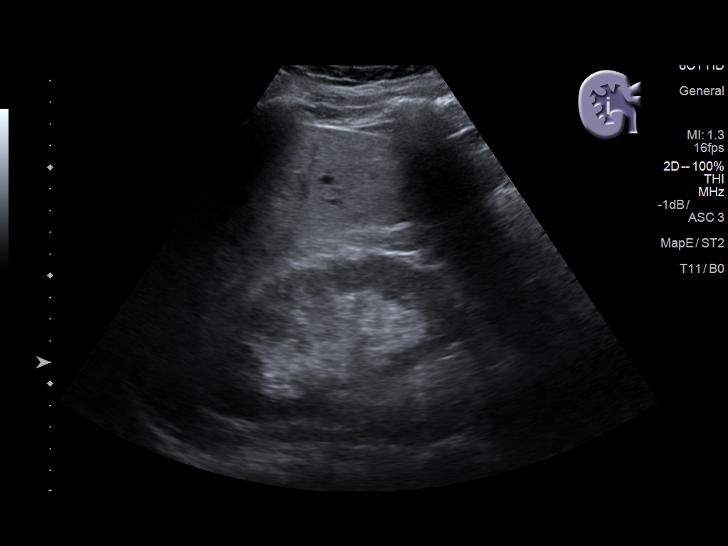
[im 10/27]
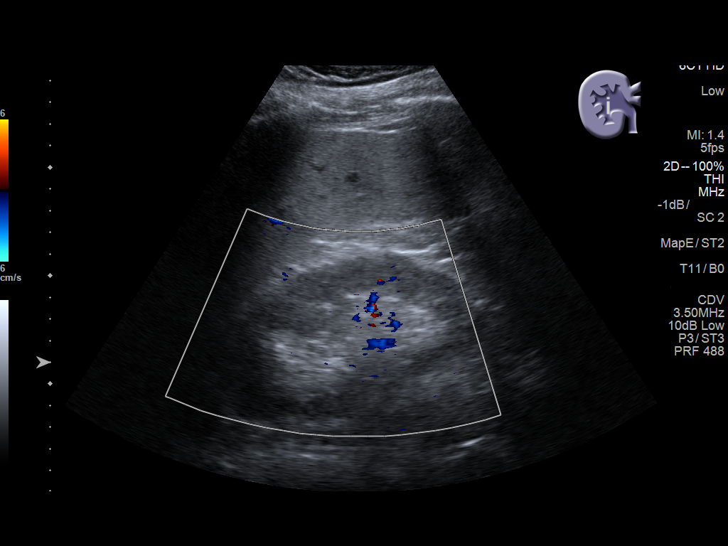
[im 12/27]
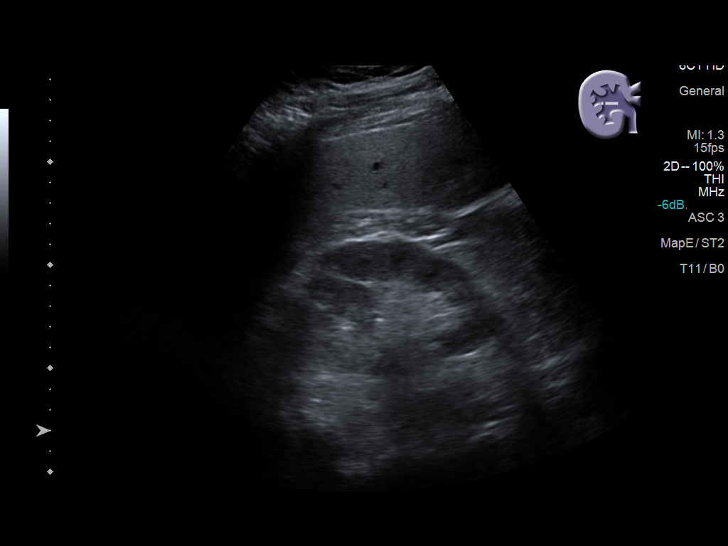
[im 15/27]
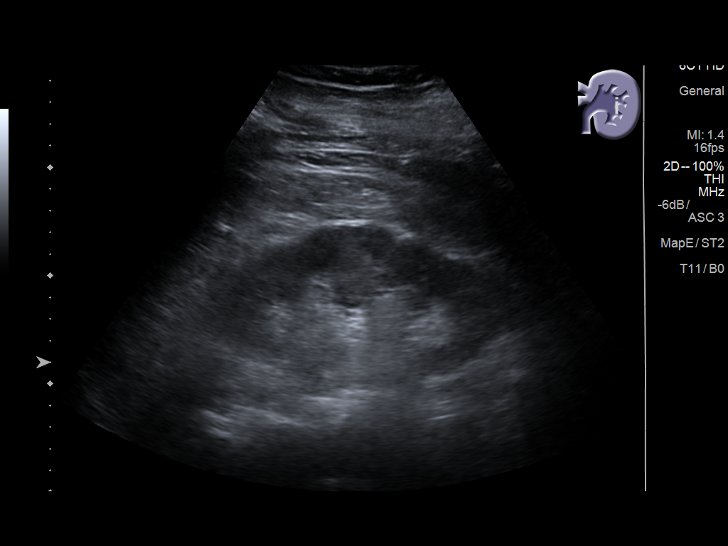
[im 17/27]
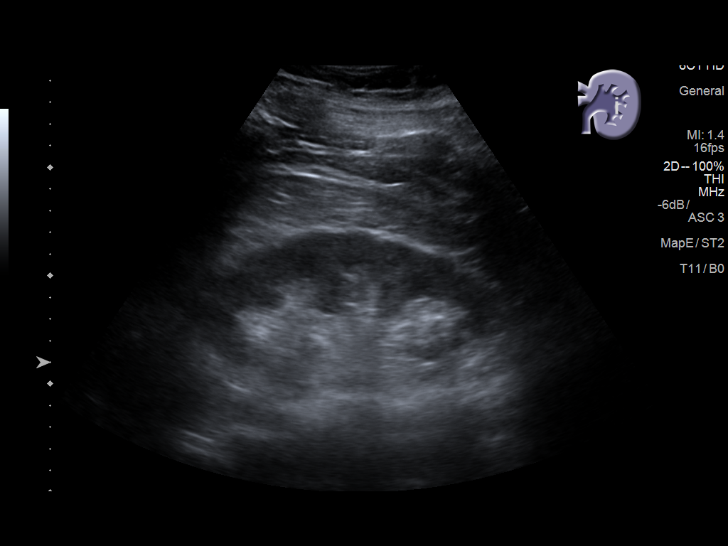
[im 18/27]
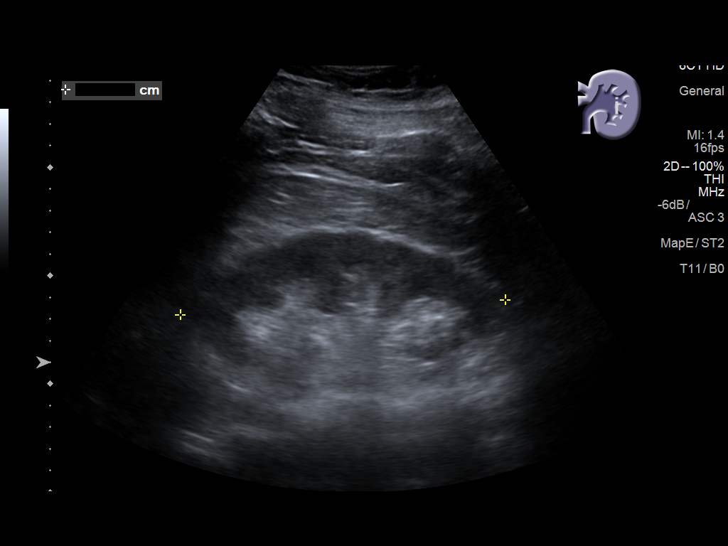
[im 20/27]
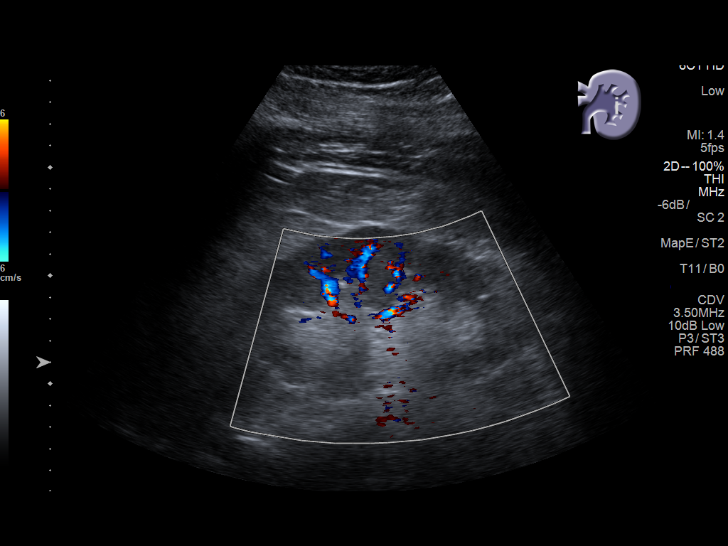
[im 22/27]
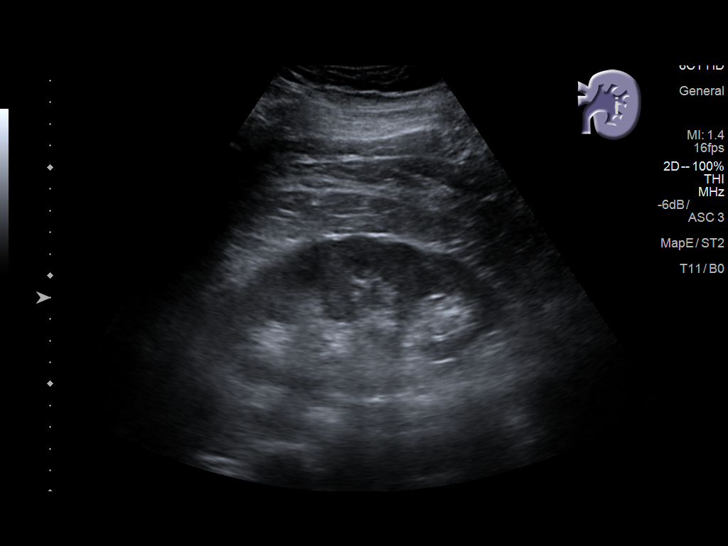
[im 24/27]
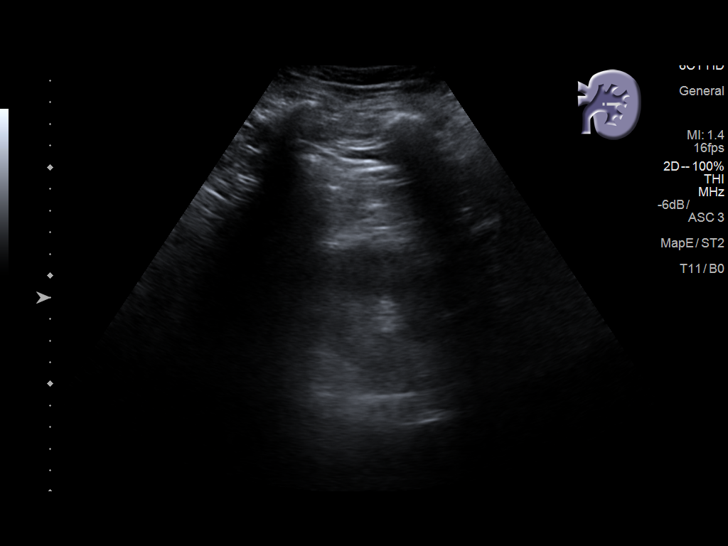
[im 27/27]
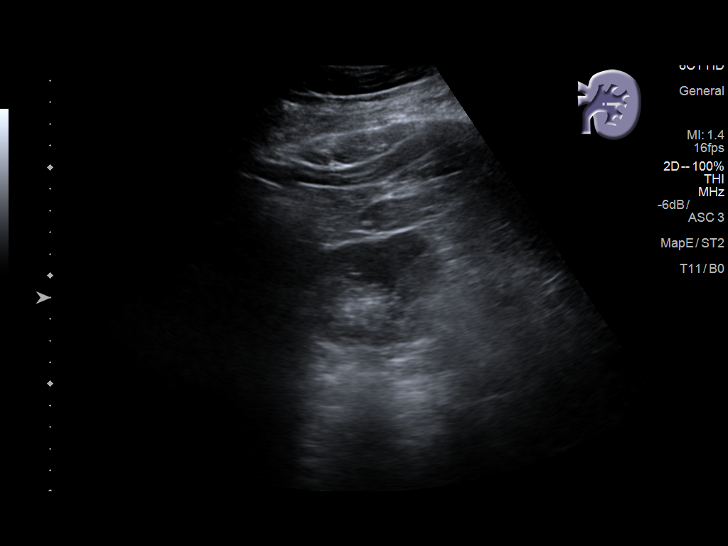

[14 of 25 positions shown; findings below may reference images not displayed]

FINDINGS: Right Kidney:

Length: 13.4 cm, within normal limits. Echogenicity within normal
limits. No mass or hydronephrosis visualized. Previously visualized
stones are not demonstrated.

Left Kidney:

Length: 15.0 cm, mildly enlarged. Echogenicity within normal limits.
No mass or hydronephrosis visualized. Previously visualized stones
are not demonstrated.

Bladder:

Appears normal for degree of bladder distention.
IMPRESSION: No hydronephrosis or urinary tract obstruction.

Previously visualize stones bilaterally are not demonstrated.

## 2016-11-06 DIAGNOSIS — J301 Allergic rhinitis due to pollen: Secondary | ICD-10-CM | POA: Diagnosis not present

## 2016-11-06 DIAGNOSIS — J342 Deviated nasal septum: Secondary | ICD-10-CM | POA: Diagnosis not present

## 2016-11-06 DIAGNOSIS — G4733 Obstructive sleep apnea (adult) (pediatric): Secondary | ICD-10-CM | POA: Diagnosis not present

## 2016-11-06 DIAGNOSIS — J329 Chronic sinusitis, unspecified: Secondary | ICD-10-CM | POA: Diagnosis not present

## 2016-11-13 DIAGNOSIS — J342 Deviated nasal septum: Secondary | ICD-10-CM | POA: Diagnosis not present

## 2016-12-15 DIAGNOSIS — J329 Chronic sinusitis, unspecified: Secondary | ICD-10-CM | POA: Diagnosis not present

## 2016-12-15 DIAGNOSIS — J343 Hypertrophy of nasal turbinates: Secondary | ICD-10-CM | POA: Diagnosis not present

## 2016-12-15 DIAGNOSIS — J342 Deviated nasal septum: Secondary | ICD-10-CM | POA: Diagnosis not present

## 2017-05-05 ENCOUNTER — Emergency Department (HOSPITAL_COMMUNITY): Payer: 59

## 2017-05-05 ENCOUNTER — Other Ambulatory Visit: Payer: Self-pay

## 2017-05-05 ENCOUNTER — Encounter (HOSPITAL_COMMUNITY): Payer: Self-pay | Admitting: *Deleted

## 2017-05-05 ENCOUNTER — Emergency Department (HOSPITAL_COMMUNITY)
Admission: EM | Admit: 2017-05-05 | Discharge: 2017-05-05 | Disposition: A | Discharge status: Home / Self Care | Payer: 59 | Attending: Emergency Medicine | Admitting: Emergency Medicine

## 2017-05-05 DIAGNOSIS — R1031 Right lower quadrant pain: Secondary | ICD-10-CM | POA: Diagnosis not present

## 2017-05-05 DIAGNOSIS — R109 Unspecified abdominal pain: Secondary | ICD-10-CM | POA: Diagnosis present

## 2017-05-05 DIAGNOSIS — R110 Nausea: Secondary | ICD-10-CM

## 2017-05-05 DIAGNOSIS — Z79899 Other long term (current) drug therapy: Secondary | ICD-10-CM | POA: Insufficient documentation

## 2017-05-05 DIAGNOSIS — R310 Gross hematuria: Secondary | ICD-10-CM | POA: Diagnosis not present

## 2017-05-05 DIAGNOSIS — N200 Calculus of kidney: Secondary | ICD-10-CM

## 2017-05-05 DIAGNOSIS — I10 Essential (primary) hypertension: Secondary | ICD-10-CM | POA: Diagnosis not present

## 2017-05-05 DIAGNOSIS — K429 Umbilical hernia without obstruction or gangrene: Secondary | ICD-10-CM | POA: Diagnosis not present

## 2017-05-05 DIAGNOSIS — R10A1 Flank pain, right side: Secondary | ICD-10-CM

## 2017-05-05 LAB — COMPREHENSIVE METABOLIC PANEL
ALT: 60 U/L (ref 17–63)
AST: 49 U/L — ABNORMAL HIGH (ref 15–41)
Albumin: 3.7 g/dL (ref 3.5–5.0)
Alkaline Phosphatase: 73 U/L (ref 38–126)
Anion gap: 7 (ref 5–15)
BUN: 11 mg/dL (ref 6–20)
CHLORIDE: 106 mmol/L (ref 101–111)
CO2: 21 mmol/L — AB (ref 22–32)
Calcium: 8.7 mg/dL — ABNORMAL LOW (ref 8.9–10.3)
Creatinine, Ser: 0.99 mg/dL (ref 0.61–1.24)
GFR calc Af Amer: >60 mL/min (ref >60)
Glucose, Bld: 106 mg/dL — ABNORMAL HIGH (ref 65–99)
POTASSIUM: 4.9 mmol/L (ref 3.5–5.1)
SODIUM: 134 mmol/L — AB (ref 135–145)
Total Bilirubin: 1.4 mg/dL — ABNORMAL HIGH (ref 0.3–1.2)
Total Protein: 6.5 g/dL (ref 6.5–8.1)

## 2017-05-05 LAB — URINALYSIS, ROUTINE W REFLEX MICROSCOPIC
Bacteria, UA: NONE SEEN
Bilirubin Urine: NEGATIVE
Glucose, UA: NEGATIVE mg/dL
KETONES UR: NEGATIVE mg/dL
Leukocytes, UA: NEGATIVE
Nitrite: NEGATIVE
PROTEIN: 100 mg/dL — AB
Specific Gravity, Urine: 1.019 (ref 1.005–1.030)
Squamous Epithelial / LPF: NONE SEEN
pH: 7 (ref 5.0–8.0)

## 2017-05-05 LAB — CBC WITH DIFFERENTIAL/PLATELET
BASOS PCT: 0 %
Basophils Absolute: 0 10*3/uL (ref 0.0–0.1)
Eosinophils Absolute: 0.1 10*3/uL (ref 0.0–0.7)
Eosinophils Relative: 3 %
HCT: 44 % (ref 39.0–52.0)
Hemoglobin: 15.6 g/dL (ref 13.0–17.0)
Lymphocytes Relative: 45 %
Lymphs Abs: 1.8 10*3/uL (ref 0.7–4.0)
MCH: 32.2 pg (ref 26.0–34.0)
MCHC: 35.5 g/dL (ref 30.0–36.0)
MCV: 90.7 fL (ref 78.0–100.0)
Monocytes Absolute: 0.4 10*3/uL (ref 0.1–1.0)
Monocytes Relative: 9 %
Neutro Abs: 1.8 10*3/uL (ref 1.7–7.7)
Neutrophils Relative %: 43 %
Platelets: 185 10*3/uL (ref 150–400)
RBC: 4.85 MIL/uL (ref 4.22–5.81)
RDW: 12.4 % (ref 11.5–15.5)
WBC: 4.1 10*3/uL (ref 4.0–10.5)

## 2017-05-05 IMAGING — CT CT RENAL STONE PROTOCOL
2 of 4 series · 16 of 46 positions shown, 18 images · non-contrast
Comparison: CT abdomen and pelvis [DATE] and [DATE].

CLINICAL DATA: Right flank and right lower quadrant pain for 3 days
with gross hematuria.

EXAM:
CT ABDOMEN AND PELVIS WITHOUT CONTRAST
TECHNIQUE: Multidetector CT imaging of the abdomen and pelvis was performed
following the standard protocol without IV contrast.

[Series 3: ap without · axial · non-contrast · 0.94mm/px · z∈[-617,-167]mm · 13 of 102 slices shown, 15 images]
[im 6/102  soft-tissue]
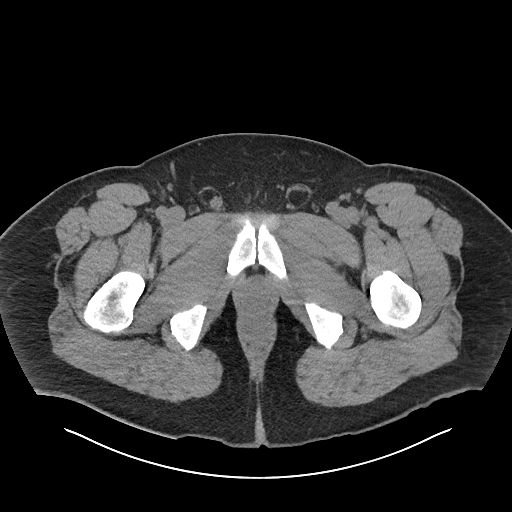
[im 6/102  bone]
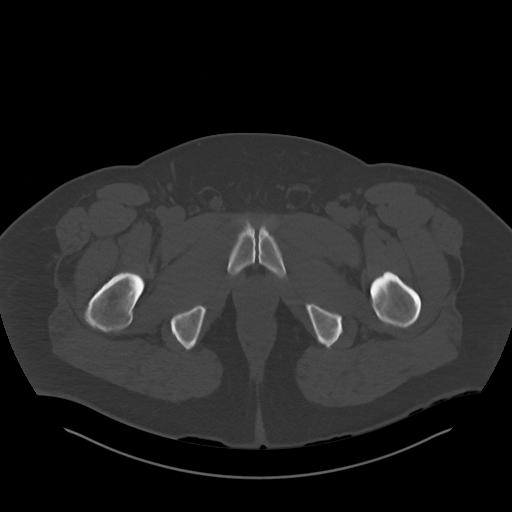
[im 12/102  soft-tissue]
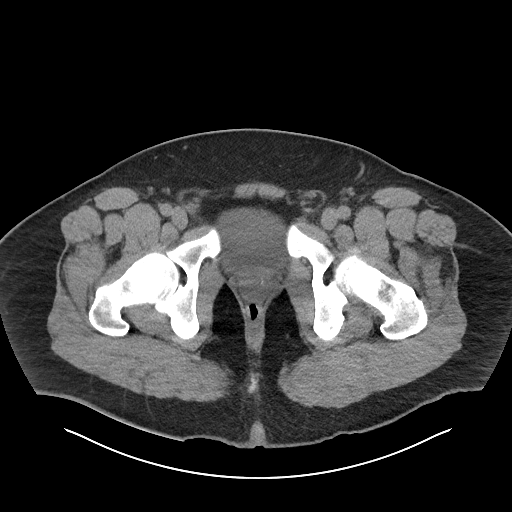
[im 23/102  soft-tissue]
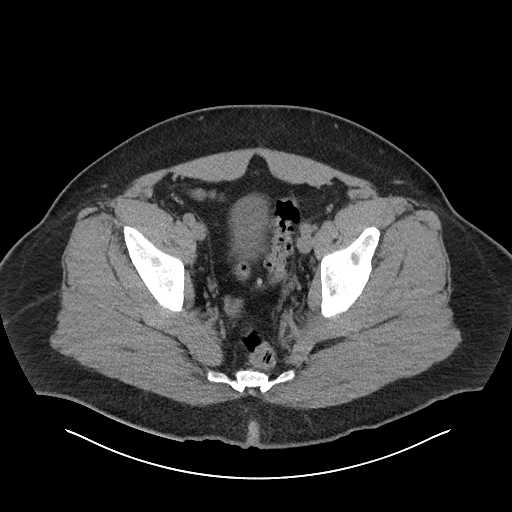
[im 29/102  soft-tissue]
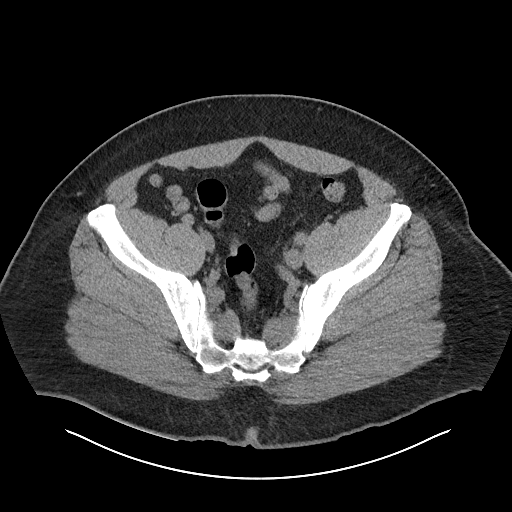
[im 34/102  soft-tissue]
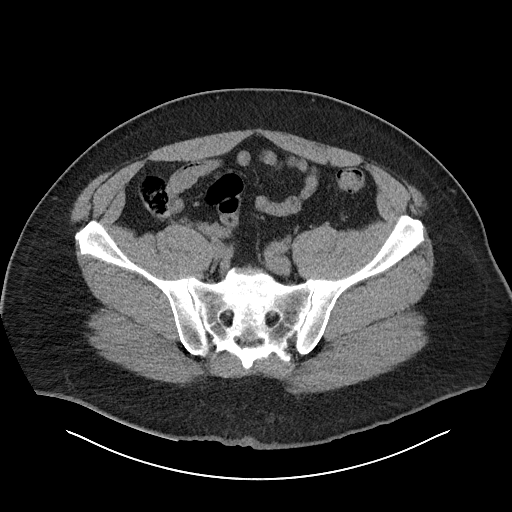
[im 45/102  soft-tissue]
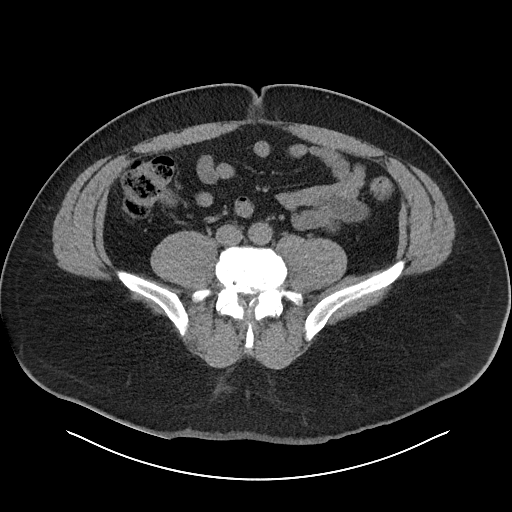
[im 51/102  soft-tissue]
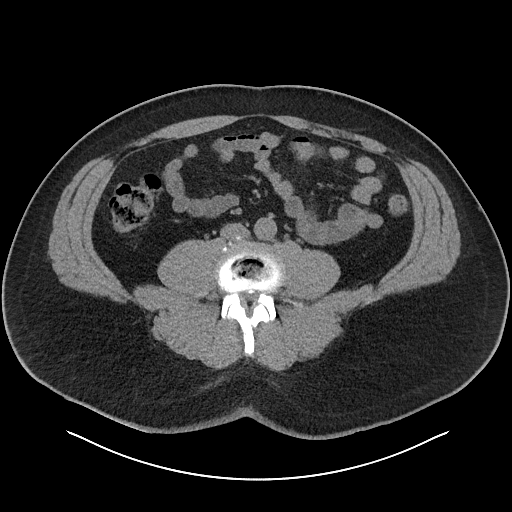
[im 57/102  soft-tissue]
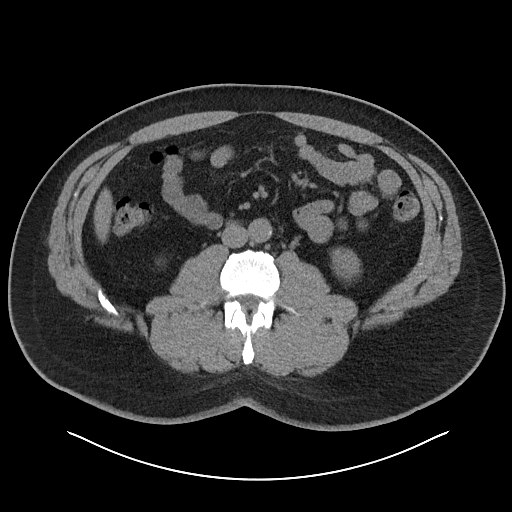
[im 68/102  soft-tissue]
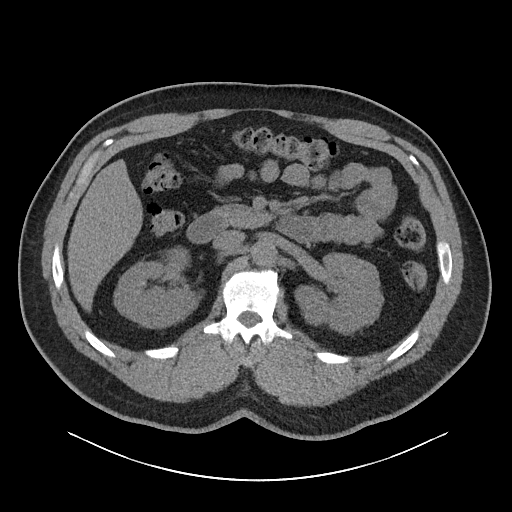
[im 68/102  bone]
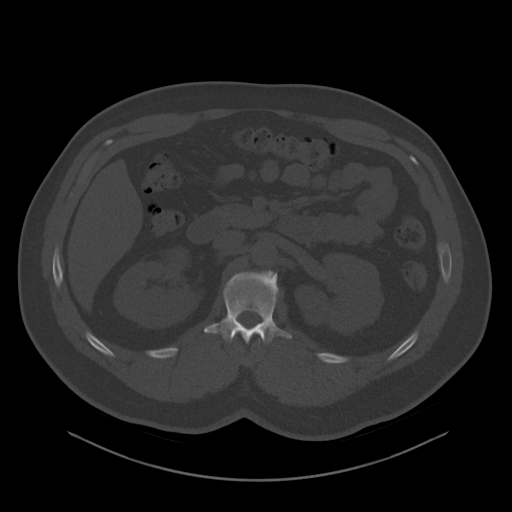
[im 73/102  soft-tissue]
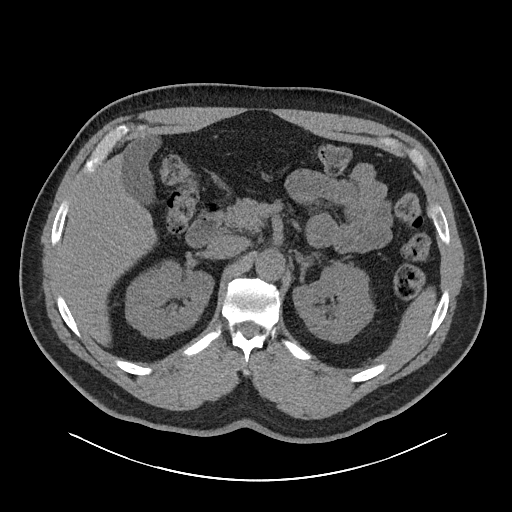
[im 79/102  soft-tissue]
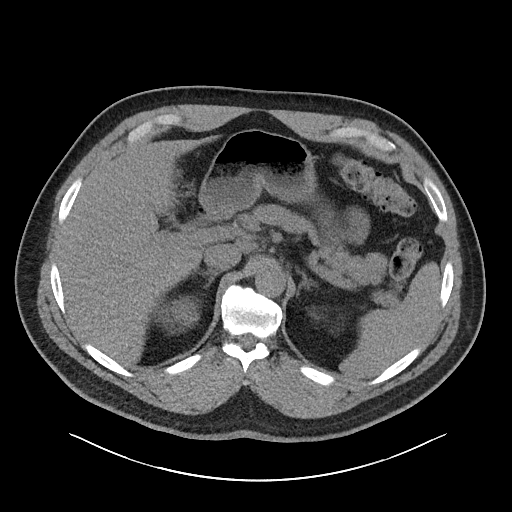
[im 90/102  soft-tissue]
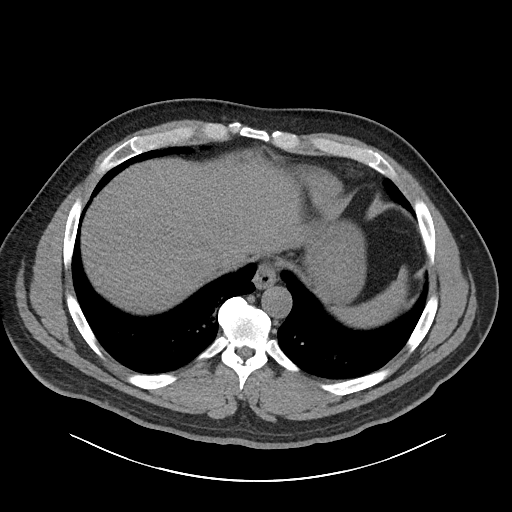
[im 96/102  soft-tissue]
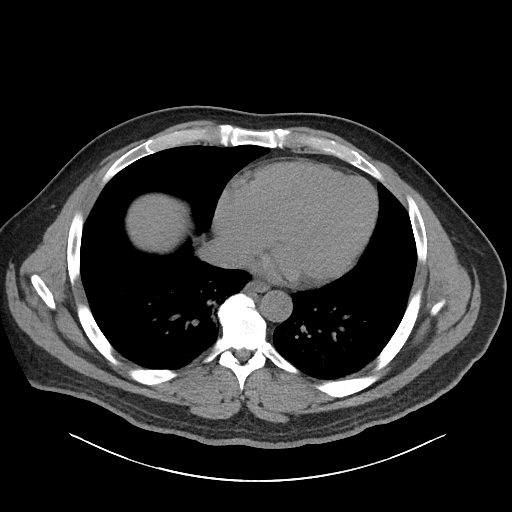

[Series 6: cor · coronal · 0.99mm/px · 3 of 148 slices shown]
[im 50/148  soft-tissue]
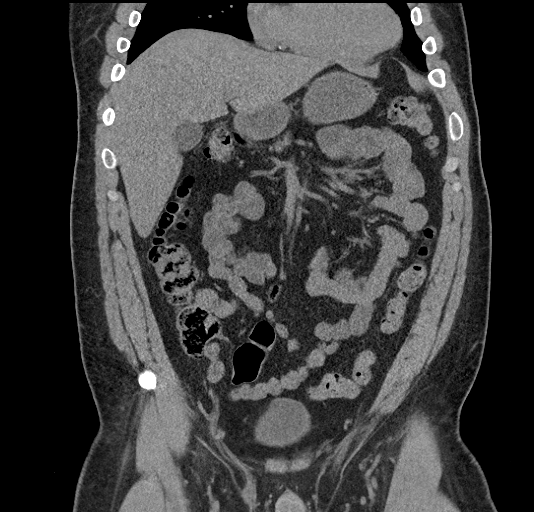
[im 66/148  soft-tissue]
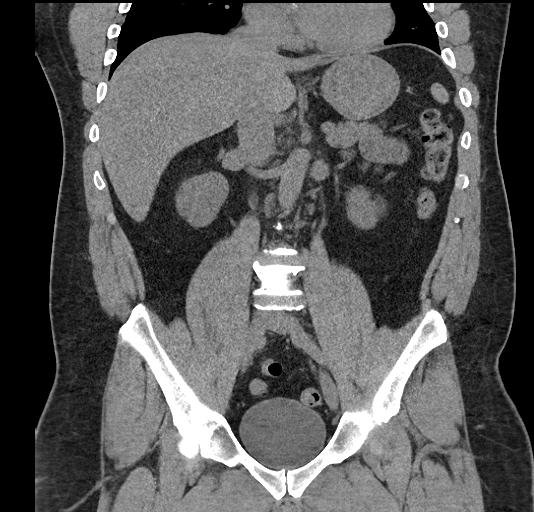
[im 82/148  soft-tissue]
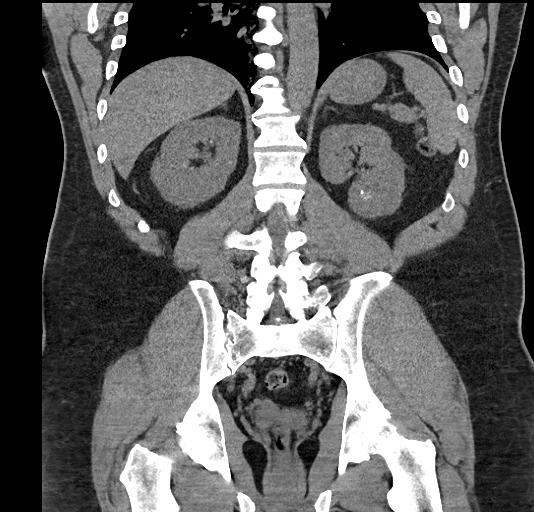

[16 of 46 positions shown; findings below may reference images not displayed]

FINDINGS: Lower chest: There is some dependent atelectasis in the lung bases.
No pleural or pericardial effusion.

Hepatobiliary: No focal liver abnormality is seen. No gallstones,
gallbladder wall thickening, or biliary dilatation.

Pancreas: Unremarkable. No pancreatic ductal dilatation or
surrounding inflammatory changes.

Spleen: Normal in size without focal abnormality.

Adrenals/Urinary Tract: The patient has multiple small
nonobstructing bilateral renal stones as seen on the prior
examinations. There is no hydronephrosis on the right or left. No
right ureteral stones or urinary bladder stones are identified. On
image 79 of series 3, there is a 0.4 cm calcification in the left
pelvis which may be within the left ureter. The left ureter is
difficult to trace in the pelvis but no calcification is present in
the left pelvis on the prior exams. Tiny left adrenal adenoma is
noted. The right adrenal gland appears normal.

Stomach/Bowel: Stomach is within normal limits. Appendix appears
normal. No evidence of bowel wall thickening, distention, or
inflammatory changes.

Vascular/Lymphatic: No significant vascular findings are present. No
enlarged abdominal or pelvic lymph nodes.

Reproductive: Prostate is unremarkable.

Other: No ascites.  Small fat containing umbilical hernia is noted.

Musculoskeletal: No acute bony abnormality is seen. Degenerative
disc disease is present at L3-4, L4-5 and L5-S1. Mild appearing
osteoarthritis right hip also noted.
IMPRESSION: Multiple bilateral nonobstructing renal stones. Although there is no
hydronephrosis on the right or left, a 0.4 cm calcification in the
left pelvis is new since the prior examinations and likely within
the left ureter. The left ureter is difficult to trace confidently
in the pelvis in this location.

Small fat containing umbilical hernia.

Degenerative disc disease L3-S1 appears worst at L3-4 and L4-5.

## 2017-05-05 MED ORDER — ONDANSETRON 4 MG PO TBDP: 4.0000 mg | ORAL_TABLET | Freq: Three times a day (TID) | ORAL | 0 refills | Status: DC | PRN

## 2017-05-05 MED ORDER — KETOROLAC TROMETHAMINE 30 MG/ML IJ SOLN
30.0000 mg | Freq: Once | INTRAMUSCULAR | Status: AC
  Administered 2017-05-05: 30 mg via INTRAVENOUS
  Filled 2017-05-05: qty 1

## 2017-05-05 MED ORDER — HYDROCODONE-ACETAMINOPHEN 5-325 MG PO TABS: 1.0000 | ORAL_TABLET | Freq: Four times a day (QID) | ORAL | 0 refills | Status: DC | PRN

## 2017-05-05 MED ORDER — MORPHINE SULFATE (PF) 4 MG/ML IV SOLN
4.0000 mg | Freq: Once | INTRAVENOUS | Status: AC
Start: 2017-05-05 — End: 2017-05-05
  Administered 2017-05-05: 4 mg via INTRAVENOUS
  Filled 2017-05-05: qty 1

## 2017-05-05 MED ORDER — TAMSULOSIN HCL 0.4 MG PO CAPS: 0.4000 mg | ORAL_CAPSULE | Freq: Every day | ORAL | 0 refills | Status: DC

## 2017-05-05 MED ORDER — ONDANSETRON HCL 4 MG/2ML IJ SOLN
4.0000 mg | Freq: Once | INTRAMUSCULAR | Status: AC
  Administered 2017-05-05: 4 mg via INTRAVENOUS
  Filled 2017-05-05: qty 2

## 2017-05-05 MED ORDER — MORPHINE SULFATE (PF) 4 MG/ML IV SOLN
4.0000 mg | Freq: Once | INTRAVENOUS | Status: AC
  Administered 2017-05-05: 4 mg via INTRAVENOUS
  Filled 2017-05-05: qty 1

## 2017-05-05 MED ORDER — NAPROXEN 500 MG PO TABS: 500.0000 mg | ORAL_TABLET | Freq: Two times a day (BID) | ORAL | 0 refills | Status: DC | PRN

## 2017-05-05 NOTE — ED Notes (Signed)
Pt in CT at this time.

## 2017-05-05 NOTE — ED Notes (Signed)
Mike, RN at the bedside attempting to start IV 

## 2017-05-05 NOTE — ED Notes (Signed)
IV attempted x1 without success

## 2017-05-05 NOTE — ED Notes (Signed)
Pt given gingerale, water, and saltines

## 2017-05-05 NOTE — ED Triage Notes (Signed)
Pt is here with discomfort and back pain for 3 days.  Pt has sensation to urination with decrease amount.  Pt had blood in urine today. Pt has more right flank pain and lower abdominal to groin pain.  Pt reports nausea

## 2017-05-05 NOTE — ED Provider Notes (Signed)
[facility]ENC[facility] Provider Note   CSN: [id number] Arrival date & time: 05/05/17  [id number]     History   Chief Complaint Chief Complaint  Patient presents with  . Abdominal Pain  . Flank Pain    HPI Henry SchwartzMichael J Defrancesco is a 46 y.o. male with a PMHx of nephrolithiasis and HTN, who presents to the ED with complaints of right flank pain x 3 days.  Patient states this feels like his prior kidney stones.  He describes the pain as 8/10 constant aching right flank pain that radiates towards the right groin and lower abdomen, worse with breathing, and minimally improved with Aleve.  He reports associated hematuria, nausea, increased urinary frequency and urgency, and malodorous urine.  He has never needed surgery for his past kidney stones, he has not seen a urologist in more than 5 years and cannot recall the name of the urologist he previously saw.  He has always been able to pass his kidney stones. He denies fevers, chills, CP, SOB, vomiting, diarrhea/constipation, obstipation, melena, hematochezia, dysuria, testicular pain/swelling, penile discharge, myalgias, arthralgias, numbness, tingling, focal weakness, or any other complaints at this time. Denies recent travel, sick contacts, suspicious food intake, EtOH use, NSAID use, or prior abd surgeries.    The history is provided by the patient and medical records. No language interpreter was used.  Flank Pain  This is a new problem. The current episode started more than 2 days ago. The problem occurs constantly. The problem has not changed since onset.Associated symptoms include abdominal pain. Pertinent negatives include no chest pain and no shortness of breath. Exacerbated by: breathing. Nothing relieves the symptoms. Treatments tried: aleve. The treatment provided mild relief.    Past Medical History:  Diagnosis Date  . Arthritis    neck  . Carpal tunnel syndrome on both sides 08/2014  . DDD (degenerative disc  disease), lumbar   . History of kidney stones   . Hypertension    under control with med., has been on med. x 1 yr.  . Sleep apnea    CPAP but does not use    There are no active problems to display for this patient.   Past Surgical History:  Procedure Laterality Date  . CARPAL TUNNEL RELEASE Left 09/07/2014   Procedure: LEFT CARPAL TUNNEL RELEASE;  Surgeon: Dominica SeverinWilliam Gramig, MD;  Location: [facility];  Service: Orthopedics;  Laterality: Left;  . CARPAL TUNNEL RELEASE Right 10/12/2014   Procedure: RIGHT CARPAL TUNNEL RELEASE;  Surgeon: Dominica SeverinWilliam Gramig, MD;  Location:  SURGERY CENTER;  Service: Orthopedics;  Laterality: Right;  . KNEE ARTHROSCOPY Left        Home Medications    Prior to Admission medications   Medication Sig Start Date End Date Taking? Authorizing Provider  atorvastatin (LIPITOR) 10 MG tablet Take 10 mg by mouth daily.    [provider]  fluticasone (FLONASE) 50 MCG/ACT nasal spray Place into both nostrils daily.    [provider]  ibuprofen (ADVIL,MOTRIN) 800 MG tablet Take 1 tablet (800 mg total) by mouth 3 (three) times daily. 11/02/15   Focht, Joyce CopaJessica L, PA  losartan (COZAAR) 50 MG tablet Take 50 mg by mouth daily.    [provider]  Pyridoxine HCl (VITAMIN B-6) 500 MG tablet Take 500 mg by mouth daily.    [provider]    Family History No family history on file.  Social History Social History   Tobacco Use  .  Smoking status: Never Smoker  . Smokeless tobacco: Never Used  Substance Use Topics  . Alcohol use: Yes    Comment: occasionally  . Drug use: No     Allergies   Patient has no known allergies.   Review of Systems Review of Systems  Constitutional: Negative for chills and fever.  Respiratory: Negative for shortness of breath.   Cardiovascular: Negative for chest pain.  Gastrointestinal: Positive for abdominal pain and nausea. Negative for blood in stool, constipation,  diarrhea and vomiting.  Genitourinary: Positive for flank pain, frequency, hematuria and urgency. Negative for discharge, dysuria, scrotal swelling and testicular pain.       +malodorous urine  Musculoskeletal: Negative for arthralgias and myalgias.  Skin: Negative for color change.  Allergic/Immunologic: Negative for immunocompromised state.  Neurological: Negative for weakness and numbness.  Psychiatric/Behavioral: Negative for confusion.   All other systems reviewed and are negative for acute change except as noted in the HPI.    Physical Exam Updated Vital Signs BP 139/81 (BP Location: Right Arm)   Pulse 86   Temp 97.8 F (36.6 C) (Oral)   Resp 18   SpO2 96%   Physical Exam  Constitutional: He is oriented to person, place, and time. Vital signs are normal. He appears well-developed and well-nourished.  Non-toxic appearance. No distress.  Afebrile, nontoxic, NAD  HENT:  Head: Normocephalic and atraumatic.  Mouth/Throat: Oropharynx is clear and moist and mucous membranes are normal.  Eyes: Conjunctivae and EOM are normal. Right eye exhibits no discharge. Left eye exhibits no discharge.  Neck: Normal range of motion. Neck supple.  Cardiovascular: Normal rate, regular rhythm, normal heart sounds and intact distal pulses. Exam reveals no gallop and no friction rub.  No murmur heard. Pulmonary/Chest: Effort normal and breath sounds normal. No respiratory distress. He has no decreased breath sounds. He has no wheezes. He has no rhonchi. He has no rales.  Abdominal: Soft. Normal appearance and bowel sounds are normal. He exhibits no distension. There is tenderness in the right lower quadrant and suprapubic area. There is no rigidity, no rebound, no guarding, no CVA tenderness, no tenderness at McBurney's point and negative Murphy's sign.    Soft, obese but nondistended, +BS throughout, with mild discomfort in the R flank/R lateral abdomen and slightly into the suprapubic/lower abdominal  area, no r/g/r, neg murphy's, neg mcburney's, no focal CVA TTP   Musculoskeletal: Normal range of motion.  Neurological: He is alert and oriented to person, place, and time. He has normal strength. No sensory deficit.  Skin: Skin is warm, dry and intact. No rash noted.  Psychiatric: He has a normal mood and affect.  Nursing note and vitals reviewed.    ED Treatments / Results  Labs (all labs ordered are listed, but only abnormal results are displayed) Labs Reviewed  URINALYSIS, ROUTINE W REFLEX MICROSCOPIC - Abnormal; Notable for the following components:      Result Value   Color, Urine AMBER (*)    APPearance CLOUDY (*)    Hgb urine dipstick LARGE (*)    Protein, ur 100 (*)    All other components within normal limits  COMPREHENSIVE METABOLIC PANEL - Abnormal; Notable for the following components:   Sodium 134 (*)    CO2 21 (*)    Glucose, Bld 106 (*)    Calcium 8.7 (*)    AST 49 (*)    Total Bilirubin 1.4 (*)    All other components within normal limits  CBC WITH DIFFERENTIAL/PLATELET  EKG  EKG Interpretation None       Radiology Ct Renal Stone Study  Result Date: 05/05/2017 CLINICAL DATA:  Right flank and right lower quadrant pain for 3 days with gross hematuria. EXAM: CT ABDOMEN AND PELVIS WITHOUT CONTRAST TECHNIQUE: Multidetector CT imaging of the abdomen and pelvis was performed following the standard protocol without IV contrast. COMPARISON:  CT abdomen and pelvis 03/16/2013 and 11/02/2015. FINDINGS: Lower chest: There is some dependent atelectasis in the lung bases. No pleural or pericardial effusion. Hepatobiliary: No focal liver abnormality is seen. No gallstones, gallbladder wall thickening, or biliary dilatation. Pancreas: Unremarkable. No pancreatic ductal dilatation or surrounding inflammatory changes. Spleen: Normal in size without focal abnormality. Adrenals/Urinary Tract: The patient has multiple small nonobstructing bilateral renal stones as seen on the  prior examinations. There is no hydronephrosis on the right or left. No right ureteral stones or urinary bladder stones are identified. On image 79 of series 3, there is a 0.4 cm calcification in the left pelvis which may be within the left ureter. The left ureter is difficult to trace in the pelvis but no calcification is present in the left pelvis on the prior exams. Tiny left adrenal adenoma is noted. The right adrenal gland appears normal. Stomach/Bowel: Stomach is within normal limits. Appendix appears normal. No evidence of bowel wall thickening, distention, or inflammatory changes. Vascular/Lymphatic: No significant vascular findings are present. No enlarged abdominal or pelvic lymph nodes. Reproductive: Prostate is unremarkable. Other: No ascites.  Small fat containing umbilical hernia is noted. Musculoskeletal: No acute bony abnormality is seen. Degenerative disc disease is present at L3-4, L4-5 and L5-S1. Mild appearing osteoarthritis right hip also noted. IMPRESSION: Multiple bilateral nonobstructing renal stones. Although there is no hydronephrosis on the right or left, a 0.4 cm calcification in the left pelvis is new since the prior examinations and likely within the left ureter. The left ureter is difficult to trace confidently in the pelvis in this location. Small fat containing umbilical hernia. Degenerative disc disease L3-S1 appears worst at L3-4 and L4-5. Electronically Signed   By: Drusilla Kanner M.D.   On: 05/05/2017 12:26    Procedures Procedures (including critical care time)  Medications Ordered in ED Medications  morphine 4 MG/ML injection 4 mg (4 mg Intravenous Given 05/05/17 1119)  ondansetron (ZOFRAN) injection 4 mg (4 mg Intravenous Given 05/05/17 1118)  ketorolac (TORADOL) 30 MG/ML injection 30 mg (30 mg Intravenous Given 05/05/17 1300)  morphine 4 MG/ML injection 4 mg (4 mg Intravenous Given 05/05/17 1332)     Initial Impression / Assessment and Plan / ED Course  I  have reviewed the triage vital signs and the nursing notes.  Pertinent labs & imaging results that were available during my care of the patient were reviewed by me and considered in my medical decision making (see chart for details).     46 y.o. male here with R flank pain x3 days, with associated increased urine freq/urg, hematuria, and nausea. Feels like prior stones. On exam, mild discomfort to right flank/R lateral abdomen into the suprapubic/lower abd area, nonperitoneal, no focal CVA tenderness. Awaiting U/A results, will get CBC/CMP, and give morphine/zofran, as well as obtain CT renal study. Will reassess shortly.   12:28 PM U/A with gross hematuria, no evidence of infection. CBC w/diff WNL. CMP with marginally elevated Tbili 1.4, could be from dehydration; marginally elevated AST 49, otherwise fairly unremarkable CMP, kidney function preserved. Awaiting CT results. Will give toradol now that we know his kidney  function is preserved; pt overall feeling better. Will await CT results then reassess shortly.   1:29 PM CT renal showing multiple b/l nonobstructing renal stones, no hydronephrosis, but 0.4cm calcification in L pelvis felt to likely be in the L ureter; this is likely the cause of his lower abdominal pain. It's possible he passed any stone that was on the right side, given his multiple renal stones. Pt feeling better, tolerating PO well here; wants last dose of pain med before leaving so he doesn't have to immediately go to the pharmacy, which I think is reasonable; will repeat morphine before discharging pt. Will send home with zofran, naprosyn, norco, and flomax. Urine strainer given. Advised staying hydrated. F/up with urology in 1-2wks but strict return precautions advised.  I explained the diagnosis and have given explicit precautions to return to the ER including for any other new or worsening symptoms. The patient understands and accepts the medical plan as it's been dictated and I  have answered their questions. Discharge instructions concerning home care and prescriptions have been given. The patient is STABLE and is discharged to home in good condition.   [place] reviewed prior to dispensing controlled substance medications, and 2 year search was notable for: no narcotics, only clonazepam 0.5mg  #30tabs on 07/09/16 and 05/06/15. Risks/benefits/alternatives and expectations discussed regarding controlled substances. Side effects of medications discussed. Informed consent obtained.    Final Clinical Impressions(s) / ED Diagnoses   Final diagnoses:  Right flank pain  Nephrolithiasis  Nausea  Gross hematuria    ED Discharge Orders        Ordered    tamsulosin (FLOMAX) 0.4 MG CAPS capsule  Daily after supper     05/05/17 1323    naproxen (NAPROSYN) 500 MG tablet  2 times daily PRN     05/05/17 1323    HYDROcodone-acetaminophen (NORCO) 5-325 MG tablet  Every 6 hours PRN     05/05/17 1323    ondansetron (ZOFRAN ODT) 4 MG disintegrating tablet  Every 8 hours PRN     05/05/17 1323    Strain all urine     05/05/17 [address]23       Delany Steury, JuncalMercedes, [name] 05/05/17 1333    Mancel BaleWentz, Elliott, MD 05/06/17 [phone]

## 2017-05-05 NOTE — ED Notes (Signed)
Pt given urine strainer

## 2017-05-05 NOTE — ED Notes (Signed)
Pt returned from CT °

## 2017-05-05 NOTE — ED Notes (Signed)
CT notified pt ready for transport

## 2017-05-05 NOTE — Discharge Instructions (Signed)
Take naprosyn as directed as needed for pain using norco for breakthrough pain. Do not drive or operate machinery with pain medication use. May need over-the-counter stool softener with this pain medication use. Use Zofran as needed for nausea. Use Flomax as directed, as this medication will help you pass the stone. Strain all urine to try to catch the stone when it passes. Follow-up with the urologist in the next 1 to 2 weeks for recheck of ongoing pain, however for intractable or uncontrollable symptoms at home then return to the [place] emergency department.  °  °

## 2017-05-05 NOTE — ED Notes (Signed)
Pt ambulates to room 42 with steady gait and wife at side. changing to gown.

## 2017-05-14 ENCOUNTER — Emergency Department (HOSPITAL_COMMUNITY)
Admission: EM | Admit: 2017-05-14 | Discharge: 2017-05-14 | Disposition: A | Discharge status: Home / Self Care | Payer: 59 | Attending: Emergency Medicine | Admitting: Emergency Medicine

## 2017-05-14 ENCOUNTER — Other Ambulatory Visit: Payer: Self-pay

## 2017-05-14 ENCOUNTER — Encounter (HOSPITAL_COMMUNITY): Payer: Self-pay | Admitting: Emergency Medicine

## 2017-05-14 ENCOUNTER — Emergency Department (HOSPITAL_COMMUNITY): Payer: 59

## 2017-05-14 DIAGNOSIS — I10 Essential (primary) hypertension: Secondary | ICD-10-CM | POA: Diagnosis not present

## 2017-05-14 DIAGNOSIS — N23 Unspecified renal colic: Secondary | ICD-10-CM

## 2017-05-14 DIAGNOSIS — R110 Nausea: Secondary | ICD-10-CM | POA: Insufficient documentation

## 2017-05-14 DIAGNOSIS — R109 Unspecified abdominal pain: Secondary | ICD-10-CM | POA: Diagnosis present

## 2017-05-14 DIAGNOSIS — Z79899 Other long term (current) drug therapy: Secondary | ICD-10-CM | POA: Insufficient documentation

## 2017-05-14 LAB — BASIC METABOLIC PANEL
ANION GAP: 11 (ref 5–15)
BUN: 11 mg/dL (ref 6–20)
CO2: 22 mmol/L (ref 22–32)
Calcium: 9.3 mg/dL (ref 8.9–10.3)
Chloride: 106 mmol/L (ref 101–111)
Creatinine, Ser: 1.1 mg/dL (ref 0.61–1.24)
GFR calc Af Amer: >60 mL/min (ref >60)
GFR calc non Af Amer: >60 mL/min (ref >60)
Glucose, Bld: 144 mg/dL — ABNORMAL HIGH (ref 65–99)
Potassium: 3.4 mmol/L — ABNORMAL LOW (ref 3.5–5.1)
Sodium: 139 mmol/L (ref 135–145)

## 2017-05-14 LAB — URINALYSIS, ROUTINE W REFLEX MICROSCOPIC
Bacteria, UA: NONE SEEN
Bilirubin Urine: NEGATIVE
GLUCOSE, UA: NEGATIVE mg/dL
Ketones, ur: NEGATIVE mg/dL
Leukocytes, UA: NEGATIVE
Nitrite: NEGATIVE
PH: 5 (ref 5.0–8.0)
Protein, ur: 30 mg/dL — AB
Specific Gravity, Urine: 1.018 (ref 1.005–1.030)

## 2017-05-14 LAB — CBC
HCT: 46.3 % (ref 39.0–52.0)
HEMOGLOBIN: 16.2 g/dL (ref 13.0–17.0)
MCH: 32.3 pg (ref 26.0–34.0)
MCHC: 35 g/dL (ref 30.0–36.0)
MCV: 92.4 fL (ref 78.0–100.0)
Platelets: 203 10*3/uL (ref 150–400)
RBC: 5.01 MIL/uL (ref 4.22–5.81)
RDW: 12.8 % (ref 11.5–15.5)
WBC: 5.4 10*3/uL (ref 4.0–10.5)

## 2017-05-14 MED ORDER — TAMSULOSIN HCL 0.4 MG PO CAPS: 0.4000 mg | ORAL_CAPSULE | Freq: Every day | ORAL | 0 refills | Status: DC

## 2017-05-14 MED ORDER — ONDANSETRON HCL 4 MG/2ML IJ SOLN
4.0000 mg | Freq: Once | INTRAMUSCULAR | Status: AC
  Administered 2017-05-14: 4 mg via INTRAVENOUS
  Filled 2017-05-14: qty 2

## 2017-05-14 MED ORDER — SODIUM CHLORIDE 0.9 % IV BOLUS (SEPSIS)
1000.0000 mL | Freq: Once | INTRAVENOUS | Status: AC
  Administered 2017-05-14: 1000 mL via INTRAVENOUS

## 2017-05-14 MED ORDER — NAPROXEN 500 MG PO TABS: 500.0000 mg | ORAL_TABLET | Freq: Two times a day (BID) | ORAL | 0 refills | Status: DC

## 2017-05-14 MED ORDER — OXYCODONE-ACETAMINOPHEN 5-325 MG PO TABS: 1.0000 | ORAL_TABLET | ORAL | 0 refills | Status: DC | PRN

## 2017-05-14 MED ORDER — MORPHINE SULFATE (PF) 4 MG/ML IV SOLN
4.0000 mg | Freq: Once | INTRAVENOUS | Status: AC
  Administered 2017-05-14: 4 mg via INTRAVENOUS
  Filled 2017-05-14: qty 1

## 2017-05-14 MED ORDER — OXYCODONE-ACETAMINOPHEN 5-325 MG PO TABS
1.0000 | ORAL_TABLET | Freq: Once | ORAL | Status: AC
  Administered 2017-05-14: 1 via ORAL
  Filled 2017-05-14: qty 1

## 2017-05-14 MED ORDER — KETOROLAC TROMETHAMINE 30 MG/ML IJ SOLN
30.0000 mg | Freq: Once | INTRAMUSCULAR | Status: AC
  Administered 2017-05-14: 30 mg via INTRAVENOUS
  Filled 2017-05-14: qty 1

## 2017-05-14 MED ORDER — ONDANSETRON HCL 4 MG PO TABS: 4.0000 mg | ORAL_TABLET | Freq: Four times a day (QID) | ORAL | 0 refills | Status: DC | PRN

## 2017-05-14 NOTE — ED Triage Notes (Signed)
Pt c/o left side flank pain 10/10, pt was seen here last week, he was sent home after diagnose a kidney stone, pt states he still on pain since then. No fever or chills.

## 2017-05-14 NOTE — ED Notes (Signed)
Patient transported to Ultrasound 

## 2017-05-14 NOTE — ED Provider Notes (Signed)
[facility] [place] EMERGENCY DEPARTMENT Provider Note   CSN: [id number] Arrival date & time: 05/14/17  [id number]     History   Chief Complaint Chief Complaint  Patient presents with  . Flank Pain    HPI Henry DEPREE is a 46 y.o. male.  The history is provided by the patient.  He has history of hypertension, degenerative disc disease, kidney stones and comes in with left flank pain.  He has had pain in the left lumbar area for the last 2 days.  Pain is been waxing and waning.  There has been an intermittent sensation of being kicked in the testicles.  This morning, he woke up with severe pain in the left flank with associated nausea and dry heaves.  Pain was rated at 10/10.  Pain has subsided slightly.  He has not taken anything for pain.  He has not had fever or chills or sweats.  He had been in the emergency department 2 weeks ago with similar pain and had CAT scan done at that time.  Past Medical History:  Diagnosis Date  . Arthritis    neck  . Carpal tunnel syndrome on both sides 08/2014  . DDD (degenerative disc disease), lumbar   . History of kidney stones   . Hypertension    under control with med., has been on med. x 1 yr.  . Sleep apnea    CPAP but does not use    There are no active problems to display for this patient.   Past Surgical History:  Procedure Laterality Date  . CARPAL TUNNEL RELEASE Left 09/07/2014   Procedure: LEFT CARPAL TUNNEL RELEASE;  Surgeon: [name], MD;  Location: [facility];  Service: Orthopedics;  Laterality: Left;  . CARPAL TUNNEL RELEASE Right 10/12/2014   Procedure: RIGHT CARPAL TUNNEL RELEASE;  Surgeon: [name], MD;  Location: [facility];  Service: Orthopedics;  Laterality: Right;  . KNEE ARTHROSCOPY Left        Home Medications    Prior to Admission medications   Medication Sig Start Date End Date Taking? Authorizing Provider  atorvastatin (LIPITOR) 10 MG tablet Take 10 mg by  mouth daily.   Yes [provider]  HYDROcodone-acetaminophen (NORCO) 5-325 MG tablet Take 1 tablet by mouth every 6 (six) hours as needed for severe pain. 05/05/17  Yes Street, [place], [name]  losartan (COZAAR) 50 MG tablet Take 50 mg by mouth daily.   Yes [provider]  naproxen (NAPROSYN) 500 MG tablet Take 1 tablet (500 mg total) by mouth 2 (two) times daily as needed for mild pain, moderate pain or headache (TAKE WITH MEALS.). 05/05/17  Yes Street, [place], [name]  naproxen sodium (ALEVE) 220 MG tablet Take 220 mg by mouth 2 (two) times daily as needed (pain).   Yes [provider]  ondansetron (ZOFRAN ODT) 4 MG disintegrating tablet Take 1 tablet (4 mg total) by mouth every 8 (eight) hours as needed for nausea or vomiting. 05/05/17  Yes Street, [place], [name]  oxymetazoline (AFRIN) 0.05 % nasal spray Place 1 spray into both nostrils 2 (two) times daily as needed for congestion.   Yes [provider]  tamsulosin (FLOMAX) 0.4 MG CAPS capsule Take 1 capsule (0.4 mg total) by mouth daily after supper. Take until the stone passes, then stop taking 05/05/17  Yes Street, [place], [place]  ibuprofen (ADVIL,MOTRIN) 800 MG tablet Take 1 tablet (800 mg total) by mouth 3 (three) times daily. Patient not taking:  Reported on 05/05/2017 11/02/15   Jerre SimonFocht, Jessica L, PA    Family History No family history on file.  Social History Social History   Tobacco Use  . Smoking status: Never Smoker  . Smokeless tobacco: Never Used  Substance Use Topics  . Alcohol use: Yes    Comment: occasionally  . Drug use: No     Allergies   Patient has no known allergies.   Review of Systems Review of Systems  All other systems reviewed and are negative.    Physical Exam Updated Vital Signs BP (!) 181/114 (BP Location: Right Arm)   Pulse 92   Resp 20   Ht 6\' 4"  (1.93 m)   Wt (!) 142.4 kg (314 lb)   SpO2 100%   BMI 38.22 kg/m   Physical Exam  Nursing note and vitals  reviewed.  Obese 46 year old male, who appears uncomfortable, but is in no acute distress. Vital signs are significant for hypertension. Oxygen saturation is 100%, which is normal. Head is normocephalic and atraumatic. PERRLA, EOMI. Oropharynx is clear. Neck is nontender and supple without adenopathy or JVD. Back is nontender and there is no CVA tenderness. Lungs are clear without rales, wheezes, or rhonchi. Chest is nontender. Heart has regular rate and rhythm without murmur. Abdomen is soft, flat, nontender without masses or hepatosplenomegaly and peristalsis is normoactive. Extremities have no cyanosis or edema, full range of motion is present. Skin is pale, warm, and dry without rash. Neurologic: Mental status is normal, cranial nerves are intact, there are no motor or sensory deficits.  ED Treatments / Results  Labs (all labs ordered are listed, but only abnormal results are displayed) Labs Reviewed  URINALYSIS, ROUTINE W REFLEX MICROSCOPIC - Abnormal; Notable for the following components:      Result Value   APPearance HAZY (*)    Hgb urine dipstick LARGE (*)    Protein, ur 30 (*)    Squamous Epithelial / LPF 0-5 (*)    All other components within normal limits  BASIC METABOLIC PANEL - Abnormal; Notable for the following components:   Potassium 3.4 (*)    Glucose, Bld 144 (*)    All other components within normal limits  CBC   Radiology [place] Renal  Result Date: 05/14/2017 CLINICAL DATA:  Left flank pain for 2 weeks, worse today. EXAM: RENAL / URINARY TRACT ULTRASOUND COMPLETE COMPARISON:  CT urogram 05/05/2017 FINDINGS: Right Kidney: Length: 13.4 cm, within normal limits. Echogenicity within normal limits. No mass or hydronephrosis visualized. Previously visualized stones are not demonstrated. Left Kidney: Length: 15.0 cm, mildly enlarged. Echogenicity within normal limits. No mass or hydronephrosis visualized. Previously visualized stones are not demonstrated. Bladder: Appears  normal for degree of bladder distention. IMPRESSION: No hydronephrosis or urinary tract obstruction. Previously visualize stones bilaterally are not demonstrated. Electronically Signed   By: Marin Robertshristopher  Mattern M.D.   On: 05/14/2017 07:51    Procedures Procedures (including critical care time)  Medications Ordered in ED Medications  sodium chloride 0.9 % bolus 1,000 mL (1,000 mLs Intravenous New Bag/Given 05/14/17 0650)  ondansetron (ZOFRAN) injection 4 mg (4 mg Intravenous Given 05/14/17 0650)  morphine 4 MG/ML injection 4 mg (4 mg Intravenous Given 05/14/17 0650)  ketorolac (TORADOL) 30 MG/ML injection 30 mg (30 mg Intravenous Given 05/14/17 0650)     Initial Impression / Assessment and Plan / ED Course  I have reviewed the triage vital signs and the nursing notes.  Pertinent labs & imaging results that were available  during my care of the patient were reviewed by me and considered in my medical decision making (see chart for details).  Flank pain strongly suspicious for ureteral colic.  Old records are reviewed confirming multiple small bilateral renal calculi seen on CT scan December 19.  He is given IV fluids, morphine, ketorolac, ondansetron.  He will be sent for renal ultrasound.  We will try to avoid CT scan.  He has had 6 prior CT scans of abdomen and pelvis.  He got good relief of pain with above-noted treatment.  Ultrasound showed no evidence of hydronephrosis-suspect passed ureteral calculus.  He is referred back to his PCP and also to urology.  Given prescriptions for naproxen, tamsulosin, oxycodone-acetaminophen, and ondansetron.  Final Clinical Impressions(s) / ED Diagnoses   Final diagnoses:  Ureteral colic    ED Discharge Orders        Ordered    naproxen (NAPROSYN) 500 MG tablet  2 times daily     05/14/17 0816    tamsulosin (FLOMAX) 0.4 MG CAPS capsule  Daily after supper     05/14/17 0816    oxyCODONE-acetaminophen (PERCOCET) 5-325 MG tablet  Every 4 hours PRN      05/14/17 0816    ondansetron (ZOFRAN) 4 MG tablet  Every 6 hours PRN     05/14/17 0816       Dione BoozeGlick, Quetzally Callas, MD 05/14/17 [phone]

## 2017-05-27 DIAGNOSIS — N200 Calculus of kidney: Secondary | ICD-10-CM | POA: Diagnosis not present

## 2017-06-01 DIAGNOSIS — L918 Other hypertrophic disorders of the skin: Secondary | ICD-10-CM | POA: Diagnosis not present

## 2017-07-02 DIAGNOSIS — M5416 Radiculopathy, lumbar region: Secondary | ICD-10-CM | POA: Diagnosis not present

## 2017-07-02 DIAGNOSIS — M545 Low back pain: Secondary | ICD-10-CM | POA: Diagnosis not present

## 2017-07-02 DIAGNOSIS — M48061 Spinal stenosis, lumbar region without neurogenic claudication: Secondary | ICD-10-CM | POA: Diagnosis not present

## 2017-07-05 DIAGNOSIS — M48061 Spinal stenosis, lumbar region without neurogenic claudication: Secondary | ICD-10-CM | POA: Diagnosis not present

## 2017-07-05 DIAGNOSIS — M549 Dorsalgia, unspecified: Secondary | ICD-10-CM | POA: Insufficient documentation

## 2017-07-05 DIAGNOSIS — M5136 Other intervertebral disc degeneration, lumbar region: Secondary | ICD-10-CM | POA: Diagnosis not present

## 2018-02-08 DIAGNOSIS — I10 Essential (primary) hypertension: Secondary | ICD-10-CM | POA: Diagnosis not present

## 2018-02-08 DIAGNOSIS — Z131 Encounter for screening for diabetes mellitus: Secondary | ICD-10-CM | POA: Diagnosis not present

## 2018-02-08 DIAGNOSIS — E7800 Pure hypercholesterolemia, unspecified: Secondary | ICD-10-CM | POA: Diagnosis not present

## 2018-02-08 DIAGNOSIS — G4733 Obstructive sleep apnea (adult) (pediatric): Secondary | ICD-10-CM | POA: Diagnosis not present

## 2018-03-23 DIAGNOSIS — E291 Testicular hypofunction: Secondary | ICD-10-CM | POA: Diagnosis not present

## 2018-03-23 DIAGNOSIS — M6080 Other myositis, unspecified site: Secondary | ICD-10-CM | POA: Diagnosis not present

## 2018-03-23 DIAGNOSIS — E6609 Other obesity due to excess calories: Secondary | ICD-10-CM | POA: Diagnosis not present

## 2018-04-29 DIAGNOSIS — L918 Other hypertrophic disorders of the skin: Secondary | ICD-10-CM | POA: Diagnosis not present

## 2018-06-02 ENCOUNTER — Encounter (HOSPITAL_COMMUNITY): Payer: Self-pay

## 2018-06-02 ENCOUNTER — Emergency Department (HOSPITAL_COMMUNITY)
Admission: EM | Admit: 2018-06-02 | Discharge: 2018-06-02 | Disposition: A | Discharge status: Home / Self Care | Payer: 59 | Attending: Emergency Medicine | Admitting: Emergency Medicine

## 2018-06-02 ENCOUNTER — Emergency Department (HOSPITAL_COMMUNITY): Payer: 59

## 2018-06-02 DIAGNOSIS — Z79899 Other long term (current) drug therapy: Secondary | ICD-10-CM | POA: Diagnosis not present

## 2018-06-02 DIAGNOSIS — I10 Essential (primary) hypertension: Secondary | ICD-10-CM | POA: Insufficient documentation

## 2018-06-02 DIAGNOSIS — R109 Unspecified abdominal pain: Secondary | ICD-10-CM

## 2018-06-02 DIAGNOSIS — Z87442 Personal history of urinary calculi: Secondary | ICD-10-CM | POA: Insufficient documentation

## 2018-06-02 DIAGNOSIS — N200 Calculus of kidney: Secondary | ICD-10-CM | POA: Diagnosis not present

## 2018-06-02 LAB — CBC
HEMATOCRIT: 48.1 % (ref 39.0–52.0)
HEMOGLOBIN: 16.6 g/dL (ref 13.0–17.0)
MCH: 32.2 pg (ref 26.0–34.0)
MCHC: 34.5 g/dL (ref 30.0–36.0)
MCV: 93.4 fL (ref 80.0–100.0)
Platelets: 218 10*3/uL (ref 150–400)
RBC: 5.15 MIL/uL (ref 4.22–5.81)
RDW: 11.9 % (ref 11.5–15.5)
WBC: 4.7 10*3/uL (ref 4.0–10.5)
nRBC: 0 % (ref 0.0–0.2)

## 2018-06-02 LAB — URINALYSIS, ROUTINE W REFLEX MICROSCOPIC
BILIRUBIN URINE: NEGATIVE
Glucose, UA: NEGATIVE mg/dL
HGB URINE DIPSTICK: NEGATIVE
Ketones, ur: NEGATIVE mg/dL
Leukocytes, UA: NEGATIVE
Nitrite: NEGATIVE
Protein, ur: NEGATIVE mg/dL
SPECIFIC GRAVITY, URINE: 1.016 (ref 1.005–1.030)
pH: 7 (ref 5.0–8.0)

## 2018-06-02 LAB — COMPREHENSIVE METABOLIC PANEL
ALK PHOS: 73 U/L (ref 38–126)
ALT: 66 U/L — ABNORMAL HIGH (ref 0–44)
ANION GAP: 9 (ref 5–15)
AST: 39 U/L (ref 15–41)
Albumin: 4.2 g/dL (ref 3.5–5.0)
BUN: 12 mg/dL (ref 6–20)
CALCIUM: 9.6 mg/dL (ref 8.9–10.3)
CO2: 24 mmol/L (ref 22–32)
Chloride: 107 mmol/L (ref 98–111)
Creatinine, Ser: 1.02 mg/dL (ref 0.61–1.24)
GFR calc non Af Amer: >60 mL/min (ref >60)
Glucose, Bld: 94 mg/dL (ref 70–99)
Potassium: 4.3 mmol/L (ref 3.5–5.1)
SODIUM: 140 mmol/L (ref 135–145)
Total Bilirubin: 1 mg/dL (ref 0.3–1.2)
Total Protein: 7.7 g/dL (ref 6.5–8.1)

## 2018-06-02 IMAGING — US US RENAL
1 series · 14 of 25 positions shown · non-contrast
Comparison: Renal ultrasound [DATE]

CLINICAL DATA: Left flank pain.  History of renal stones.

EXAM:
RENAL / URINARY TRACT ULTRASOUND COMPLETE

[Series 1: us renal · 14 of 60 slices shown]
[im 1/60]
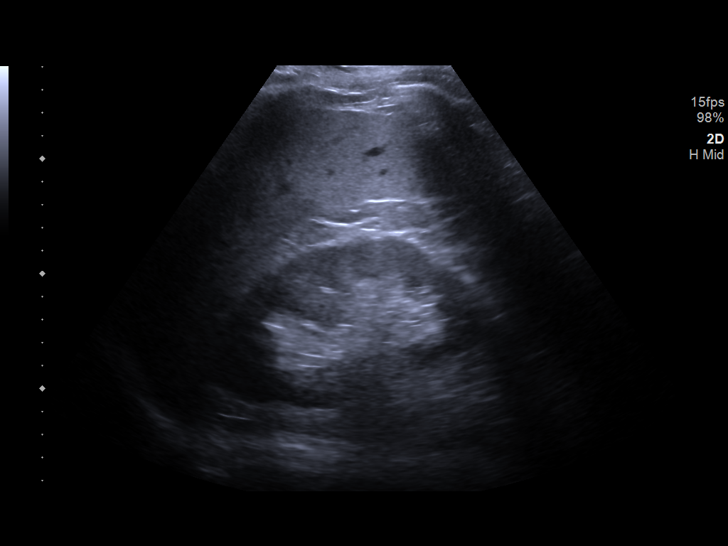
[im 5/60]
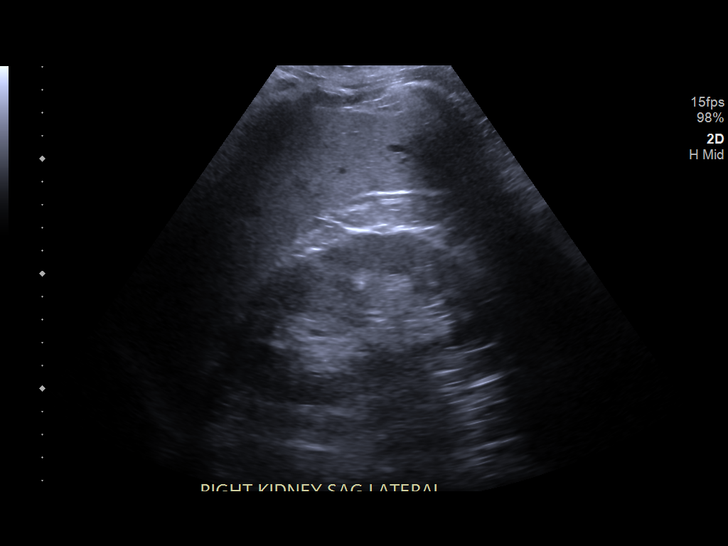
[im 10/60]
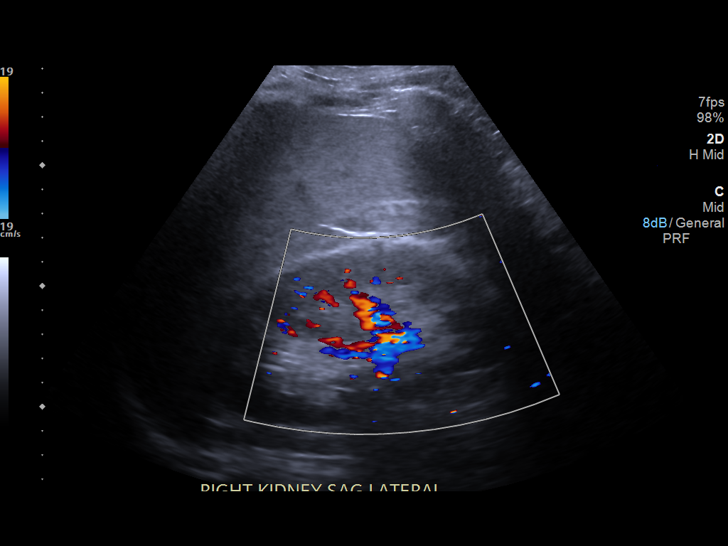
[im 15/60]
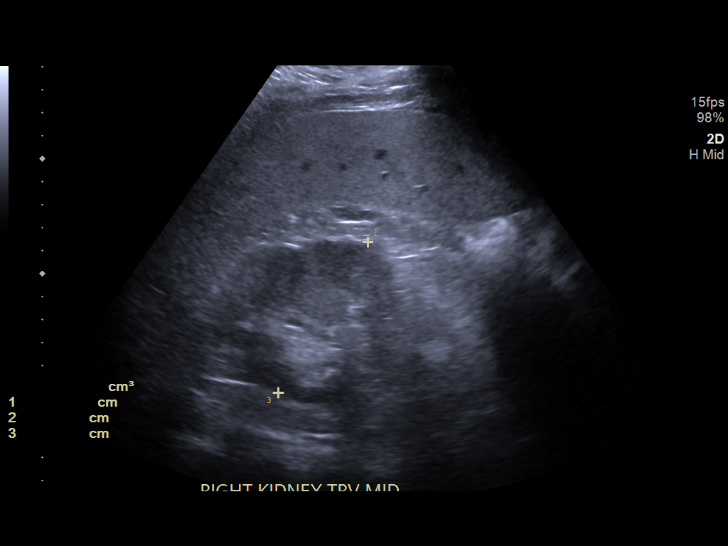
[im 20/60]
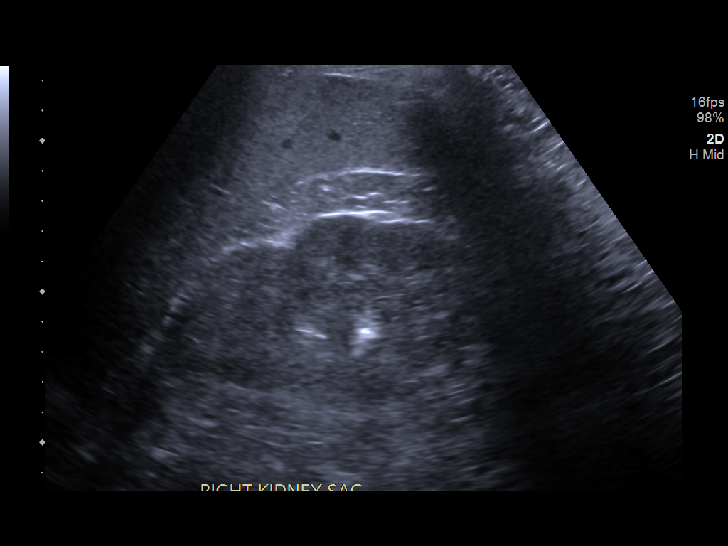
[im 23/60]
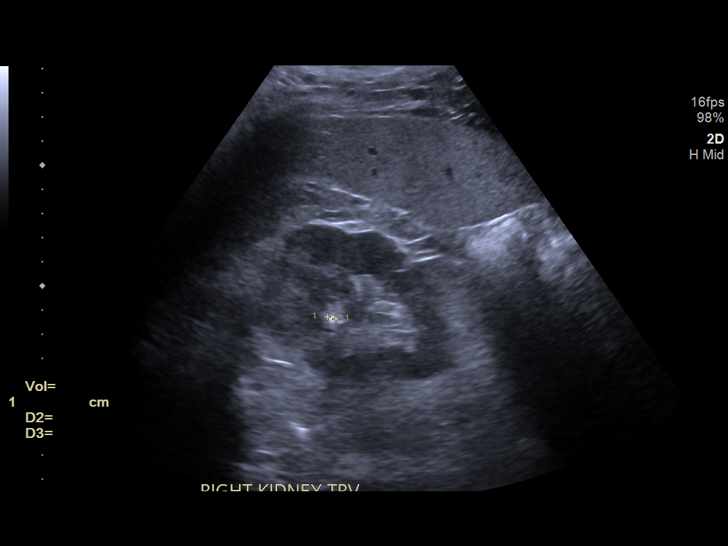
[im 28/60]
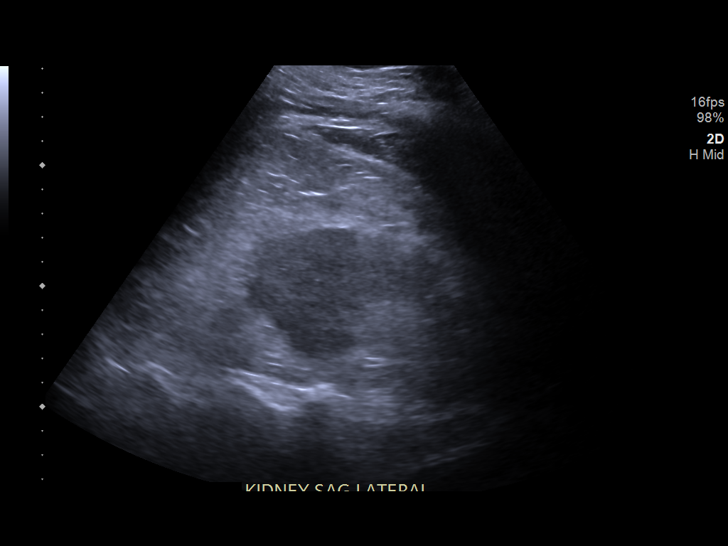
[im 32/60]
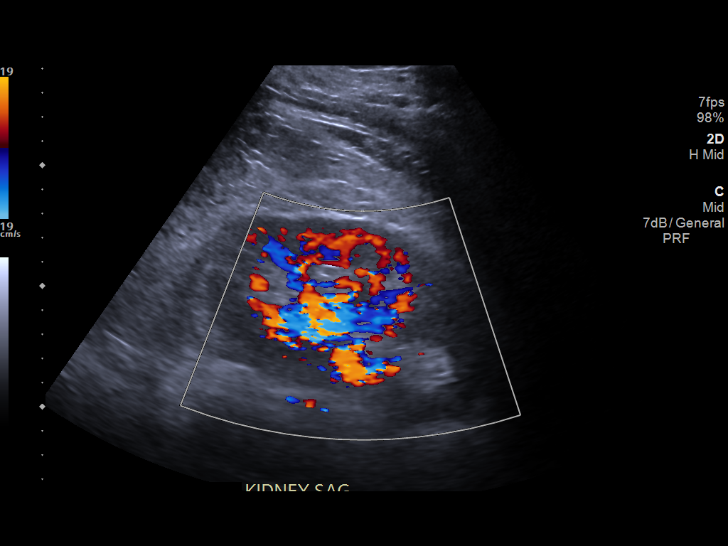
[im 37/60]
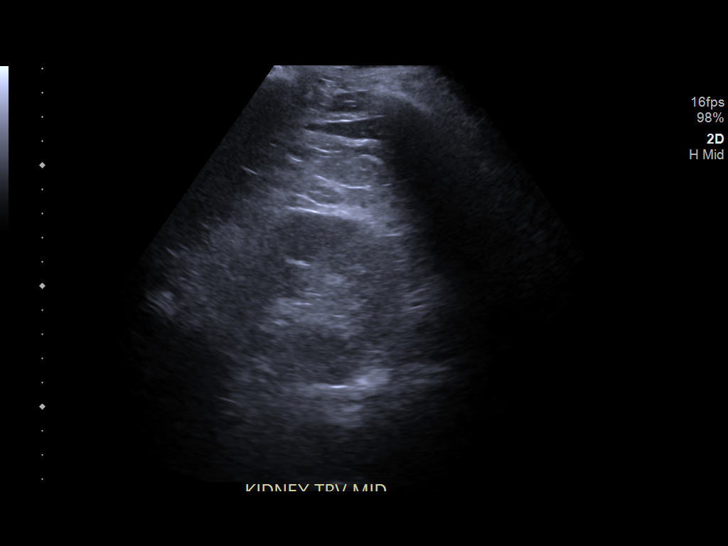
[im 40/60]
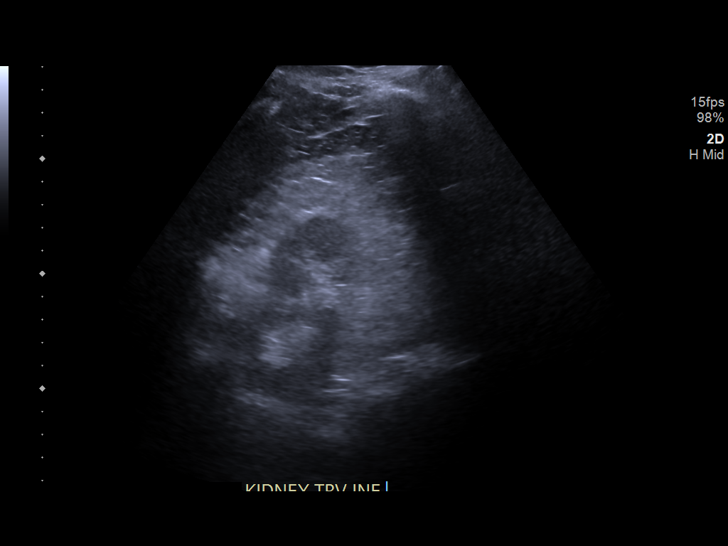
[im 45/60]
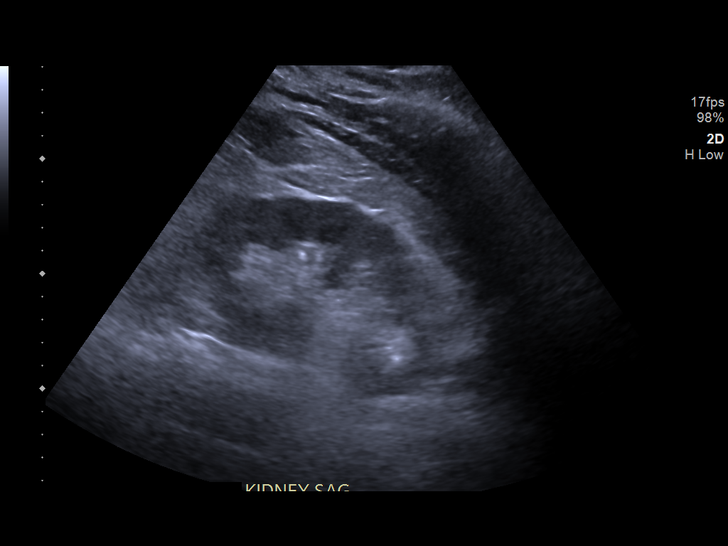
[im 50/60]
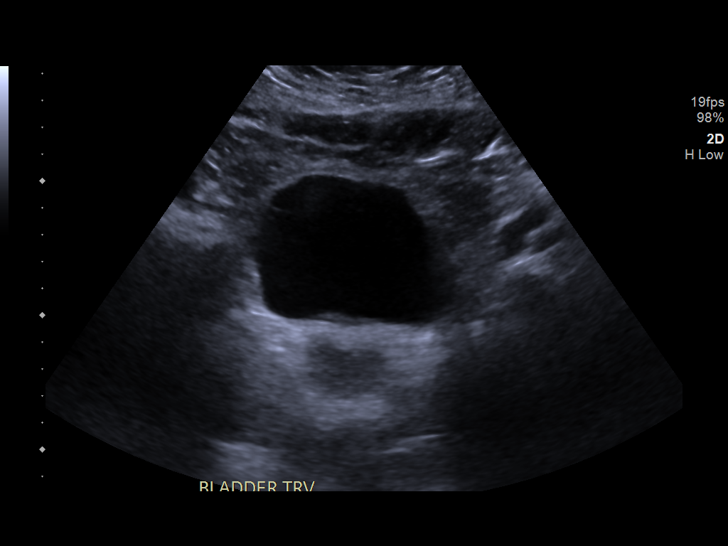
[im 55/60]
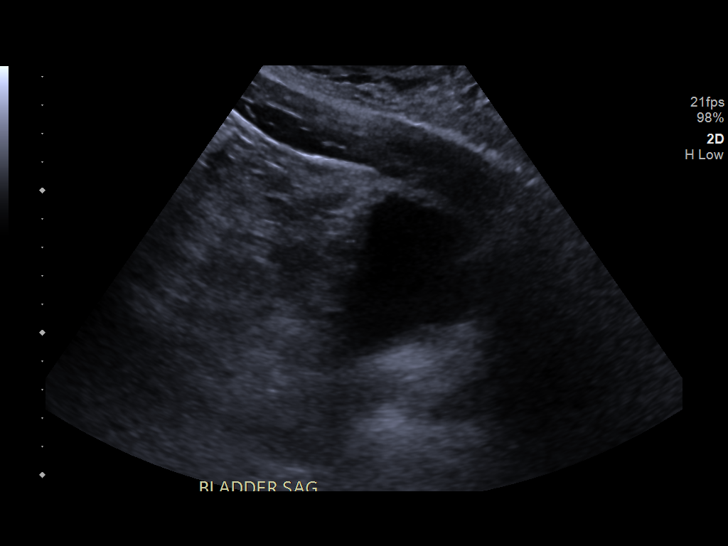
[im 60/60]
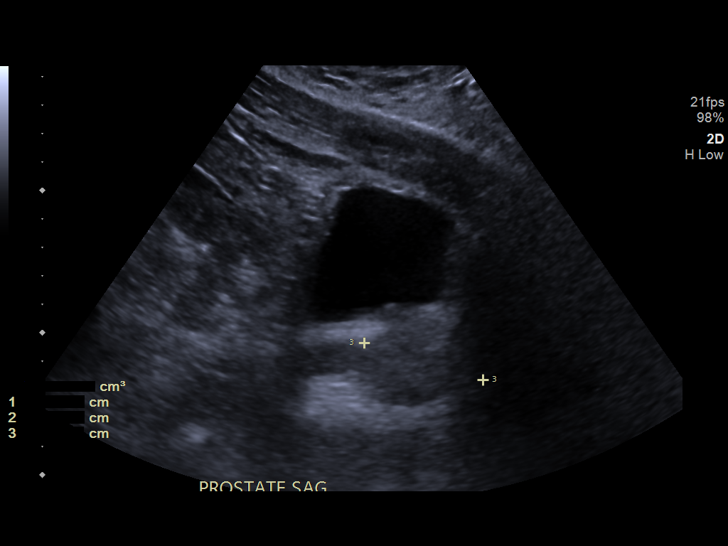

[14 of 25 positions shown; findings below may reference images not displayed]

FINDINGS: Right Kidney:

Renal measurements: 13.2 x 6.8 x 7.6 cm = volume: 359.6 mL .
Echogenicity within normal limits. No mass or hydronephrosis
visualized. 5 mm stone within the midpole.

Left Kidney:

Renal measurements: 13.1 x 7.4 x 7.3 cm = volume: 313.8 mL.
Echogenicity within normal limits. No mass or hydronephrosis
visualized. 5 mm stone inferior pole.

Bladder:

Appears normal for degree of bladder distention.
IMPRESSION: No hydronephrosis.

## 2018-06-02 MED ORDER — IBUPROFEN 800 MG PO TABS: 800.0000 mg | ORAL_TABLET | Freq: Three times a day (TID) | ORAL | 0 refills | Status: DC | PRN

## 2018-06-02 MED ORDER — OXYCODONE-ACETAMINOPHEN 5-325 MG PO TABS
1.0000 | ORAL_TABLET | ORAL | Status: DC | PRN
  Administered 2018-06-02: 1 via ORAL
  Filled 2018-06-02: qty 1

## 2018-06-02 MED ORDER — KETOROLAC TROMETHAMINE 30 MG/ML IJ SOLN
30.0000 mg | Freq: Once | INTRAMUSCULAR | Status: AC
  Administered 2018-06-02: 30 mg via INTRAVENOUS
  Filled 2018-06-02: qty 1

## 2018-06-02 MED ORDER — OXYCODONE-ACETAMINOPHEN 5-325 MG PO TABS
1.0000 | ORAL_TABLET | Freq: Once | ORAL | Status: AC
  Administered 2018-06-02: 1 via ORAL
  Filled 2018-06-02: qty 1

## 2018-06-02 MED ORDER — ONDANSETRON 4 MG PO TBDP: 4.0000 mg | ORAL_TABLET | Freq: Three times a day (TID) | ORAL | 0 refills | Status: DC | PRN

## 2018-06-02 MED ORDER — ONDANSETRON HCL 4 MG/2ML IJ SOLN
4.0000 mg | Freq: Once | INTRAMUSCULAR | Status: AC
  Administered 2018-06-02: 4 mg via INTRAVENOUS
  Filled 2018-06-02: qty 2

## 2018-06-02 NOTE — ED Triage Notes (Signed)
Pt presents for evaluation of possible kidney stones. Hx of same. L flank pain since last night. Denies vomiting but some nausea.

## 2018-06-02 NOTE — ED Provider Notes (Signed)
[facility]   CSN: [id number] Arrival date & time: 06/02/18  1129     History   Chief Complaint Chief Complaint  Patient presents with  . Flank Pain    HPI [name] is a 48 y.o. male.  48yo M w/ PMH including stones, hypertension, OSA who presents with left flank pain.  For the past few days he has had some mild discomfort on his left flank but last night his pain became more severe.  Pain has been severe and intermittent since last night, not improved after Goody powder yesterday.  Slight improvement after a Percocet at triage.  He reports associated nausea.  No abdominal pain, fevers, hematuria, dysuria, or diarrhea.  He has seen urology in the distant past but not recently.  He has never required urologic procedures for kidney stones.  The history is provided by the patient.  Flank Pain     Past Medical History:  Diagnosis Date  . Arthritis    neck  . Carpal tunnel syndrome on both sides 08/2014  . DDD (degenerative disc disease), lumbar   . History of kidney stones   . Hypertension    under control with med., has been on med. x 1 yr.  . Sleep apnea    CPAP but does not use    There are no active problems to display for this patient.   Past Surgical History:  Procedure Laterality Date  . CARPAL TUNNEL RELEASE Left 09/07/2014   Procedure: LEFT CARPAL TUNNEL RELEASE;  Surgeon: Dominica SeverinWilliam Gramig, MD;  Location: [facility];  Service: Orthopedics;  Laterality: Left;  . CARPAL TUNNEL RELEASE Right 10/12/2014   Procedure: RIGHT CARPAL TUNNEL RELEASE;  Surgeon: Dominica SeverinWilliam Gramig, MD;  Location: [facility];  Service: Orthopedics;  Laterality: Right;  . KNEE ARTHROSCOPY Left         Home Medications    Prior to Admission medications   Medication Sig Start Date End Date Taking? Authorizing Provider  atorvastatin (LIPITOR) 10 MG tablet Take 10 mg by mouth daily.    [provider]   HYDROcodone-acetaminophen (NORCO) 5-325 MG tablet Take 1 tablet by mouth every 6 (six) hours as needed for severe pain. 05/05/17   Street, Mercedes, [name]  ibuprofen (ADVIL,MOTRIN) 800 MG tablet Take 1 tablet (800 mg total) by mouth every 8 (eight) hours as needed for moderate pain. 06/02/18   Preslynn Bier, Ambrose Finlandachel Morgan, MD  losartan (COZAAR) 50 MG tablet Take 50 mg by mouth daily.    [provider]  ondansetron (ZOFRAN ODT) 4 MG disintegrating tablet Take 1 tablet (4 mg total) by mouth every 8 (eight) hours as needed for nausea or vomiting. 06/02/18   Onedia Vargus, Ambrose Finlandachel Morgan, MD  oxyCODONE-acetaminophen (PERCOCET) 5-325 MG tablet Take 1 tablet by mouth every 4 (four) hours as needed for moderate pain. 05/14/17   Dione BoozeGlick, David, MD  tamsulosin (FLOMAX) 0.4 MG CAPS capsule Take 1 capsule (0.4 mg total) by mouth daily after supper. Take until the stone passes, then stop taking 05/14/17   Dione BoozeGlick, David, MD    Family History No family history on file.  Social History Social History   Tobacco Use  . Smoking status: Never Smoker  . Smokeless tobacco: Never Used  Substance Use Topics  . Alcohol use: Yes    Comment: occasionally  . Drug use: No     Allergies   Patient has no known allergies.   Review of Systems Review of  Systems  Genitourinary: Positive for flank pain.   All other systems reviewed and are negative except that which was mentioned in HPI   Physical Exam Updated Vital Signs BP 124/90 (BP Location: Right Arm)   Pulse 80   Temp 97.7 F (36.5 C) (Oral)   Resp 18   SpO2 99%   Physical Exam Vitals signs and nursing note reviewed.  Constitutional:      General: He is not in acute distress.    Appearance: He is well-developed.  HENT:     Head: Normocephalic and atraumatic.  Eyes:     Conjunctiva/sclera: Conjunctivae normal.  Neck:     Musculoskeletal: Neck supple.  Cardiovascular:     Rate and Rhythm: Normal rate and regular rhythm.     Heart sounds: Normal  heart sounds. No murmur.  Pulmonary:     Effort: Pulmonary effort is normal.     Breath sounds: Normal breath sounds.  Abdominal:     General: Bowel sounds are normal. There is no distension.     Palpations: Abdomen is soft.     Tenderness: There is no abdominal tenderness.  Musculoskeletal:     Comments: No CVA tenderness  Skin:    General: Skin is warm and dry.  Neurological:     Mental Status: He is alert and oriented to person, place, and time.     Comments: Fluent speech  Psychiatric:        Judgment: Judgment normal.      ED Treatments / Results  Labs (all labs ordered are listed, but only abnormal results are displayed) Labs Reviewed  COMPREHENSIVE METABOLIC PANEL - Abnormal; Notable for the following components:      Result Value   ALT 66 (*)    All other components within normal limits  URINALYSIS, ROUTINE W REFLEX MICROSCOPIC  CBC  RAPID URINE DRUG SCREEN, HOSP PERFORMED    EKG None  Radiology [place]al  Result Date: 06/02/2018 CLINICAL DATA:  Left flank pain.  History of renal stones. EXAM: RENAL / URINARY TRACT ULTRASOUND COMPLETE COMPARISON:  Renal ultrasound 05/14/2017 FINDINGS: Right Kidney: Renal measurements: 13.2 x 6.8 x 7.6 cm = volume: 359.6 mL . Echogenicity within normal limits. No mass or hydronephrosis visualized. 5 mm stone within the midpole. Left Kidney: Renal measurements: 13.1 x 7.4 x 7.3 cm = volume: 313.8 mL. Echogenicity within normal limits. No mass or hydronephrosis visualized. 5 mm stone inferior pole. Bladder: Appears normal for degree of bladder distention. IMPRESSION: No hydronephrosis. Electronically Signed   By: Annia Beltrew  Davis M.D.   On: 06/02/2018 15:00    Procedures Procedures (including critical care time)  Medications Ordered in ED Medications  oxyCODONE-acetaminophen (PERCOCET/ROXICET) 5-325 MG per tablet 1 tablet (1 tablet Oral Given 06/02/18 1155)  ketorolac (TORADOL) 30 MG/ML injection 30 mg (30 mg Intravenous Given 06/02/18  1416)  ondansetron (ZOFRAN) injection 4 mg (4 mg Intravenous Given 06/02/18 1416)  oxyCODONE-acetaminophen (PERCOCET/ROXICET) 5-325 MG per tablet 1 tablet (1 tablet Oral Given 06/02/18 1416)     Initial Impression / Assessment and Plan / ED Course  I have reviewed the triage vital signs and the nursing notes.  Pertinent labs & imaging results that were available during my care of the patient were reviewed by me and considered in my medical decision making (see chart for details).     Well appearing on exam, normal VS. No abd tenderness.  I discussed that we could assess for hydronephrosis with ultrasound and avoid radiation exposure given his  history of multiple previous CT scans and his young age.  All lab work is unremarkable including normal CBC, normal CMP, normal UA.  Renal ultrasound is also normal.  Given no evidence of hydronephrosis and no hematuria, highly doubt large stone.  I recommended that he contact [facility] for a follow-up appointment.  Discussed supportive measures.  Wife requested narcotics in the event that his pain returns but at this point in time we have no objective data to suggest ureteral stone currently therefore I deferred to urology follow-up.  I reviewed return precautions and patient voiced understanding.  Final Clinical Impressions(s) / ED Diagnoses   Final diagnoses:  Left flank pain    ED Discharge Orders         Ordered    ibuprofen (ADVIL,MOTRIN) 800 MG tablet  Every 8 hours PRN     06/02/18 1557    ondansetron (ZOFRAN ODT) 4 MG disintegrating tablet  Every 8 hours PRN     06/02/18 1557           Edwyn Inclan, Ambrose Finland, MD 06/02/18 1905

## 2018-06-02 NOTE — ED Notes (Signed)
Patient verbalized understanding of discharge instructions and denies any further needs or questions at this time. VS stable. Patient ambulatory with steady gait.  

## 2018-06-08 DIAGNOSIS — S134XXA Sprain of ligaments of cervical spine, initial encounter: Secondary | ICD-10-CM | POA: Diagnosis not present

## 2018-06-08 DIAGNOSIS — S233XXA Sprain of ligaments of thoracic spine, initial encounter: Secondary | ICD-10-CM | POA: Diagnosis not present

## 2018-06-08 DIAGNOSIS — S338XXA Sprain of other parts of lumbar spine and pelvis, initial encounter: Secondary | ICD-10-CM | POA: Diagnosis not present

## 2018-06-13 DIAGNOSIS — S338XXA Sprain of other parts of lumbar spine and pelvis, initial encounter: Secondary | ICD-10-CM | POA: Diagnosis not present

## 2018-06-13 DIAGNOSIS — S233XXA Sprain of ligaments of thoracic spine, initial encounter: Secondary | ICD-10-CM | POA: Diagnosis not present

## 2018-06-13 DIAGNOSIS — S134XXA Sprain of ligaments of cervical spine, initial encounter: Secondary | ICD-10-CM | POA: Diagnosis not present

## 2018-07-29 DIAGNOSIS — L237 Allergic contact dermatitis due to plants, except food: Secondary | ICD-10-CM | POA: Diagnosis not present

## 2018-08-10 DIAGNOSIS — G4733 Obstructive sleep apnea (adult) (pediatric): Secondary | ICD-10-CM | POA: Diagnosis not present

## 2019-04-07 DIAGNOSIS — M21371 Foot drop, right foot: Secondary | ICD-10-CM | POA: Insufficient documentation

## 2019-04-07 DIAGNOSIS — M5416 Radiculopathy, lumbar region: Secondary | ICD-10-CM | POA: Insufficient documentation

## 2019-06-29 ENCOUNTER — Encounter: Payer: Self-pay | Admitting: Neurology

## 2019-07-05 ENCOUNTER — Other Ambulatory Visit: Payer: Self-pay

## 2019-07-05 ENCOUNTER — Emergency Department (HOSPITAL_COMMUNITY)
Admission: EM | Admit: 2019-07-05 | Discharge: 2019-07-05 | Disposition: A | Discharge status: Home / Self Care | Payer: 59 | Attending: Emergency Medicine | Admitting: Emergency Medicine

## 2019-07-05 ENCOUNTER — Emergency Department (HOSPITAL_COMMUNITY): Payer: 59

## 2019-07-05 ENCOUNTER — Encounter (HOSPITAL_COMMUNITY): Payer: Self-pay | Admitting: Emergency Medicine

## 2019-07-05 DIAGNOSIS — R1032 Left lower quadrant pain: Secondary | ICD-10-CM | POA: Insufficient documentation

## 2019-07-05 DIAGNOSIS — R319 Hematuria, unspecified: Secondary | ICD-10-CM | POA: Diagnosis not present

## 2019-07-05 DIAGNOSIS — N200 Calculus of kidney: Secondary | ICD-10-CM | POA: Insufficient documentation

## 2019-07-05 DIAGNOSIS — R109 Unspecified abdominal pain: Secondary | ICD-10-CM

## 2019-07-05 DIAGNOSIS — Z79899 Other long term (current) drug therapy: Secondary | ICD-10-CM | POA: Insufficient documentation

## 2019-07-05 DIAGNOSIS — I10 Essential (primary) hypertension: Secondary | ICD-10-CM | POA: Insufficient documentation

## 2019-07-05 LAB — CBC WITH DIFFERENTIAL/PLATELET
Abs Immature Granulocytes: 0.03 10*3/uL (ref 0.00–0.07)
Basophils Absolute: 0 10*3/uL (ref 0.0–0.1)
Basophils Relative: 0 %
Eosinophils Absolute: 0.2 10*3/uL (ref 0.0–0.5)
Eosinophils Relative: 3 %
HCT: 47.7 % (ref 39.0–52.0)
Hemoglobin: 16.2 g/dL (ref 13.0–17.0)
Immature Granulocytes: 1 %
Lymphocytes Relative: 35 %
Lymphs Abs: 1.8 10*3/uL (ref 0.7–4.0)
MCH: 31.9 pg (ref 26.0–34.0)
MCHC: 34 g/dL (ref 30.0–36.0)
MCV: 93.9 fL (ref 80.0–100.0)
Monocytes Absolute: 0.6 10*3/uL (ref 0.1–1.0)
Monocytes Relative: 12 %
Neutro Abs: 2.5 10*3/uL (ref 1.7–7.7)
Neutrophils Relative %: 49 %
Platelets: 223 10*3/uL (ref 150–400)
RBC: 5.08 MIL/uL (ref 4.22–5.81)
RDW: 12.3 % (ref 11.5–15.5)
WBC: 5.1 10*3/uL (ref 4.0–10.5)
nRBC: 0 % (ref 0.0–0.2)

## 2019-07-05 LAB — BASIC METABOLIC PANEL
Anion gap: 11 (ref 5–15)
BUN: 11 mg/dL (ref 6–20)
CO2: 22 mmol/L (ref 22–32)
Calcium: 9.3 mg/dL (ref 8.9–10.3)
Chloride: 107 mmol/L (ref 98–111)
Creatinine, Ser: 0.88 mg/dL (ref 0.61–1.24)
GFR calc Af Amer: >60 mL/min (ref >60)
GFR calc non Af Amer: >60 mL/min (ref >60)
Glucose, Bld: 119 mg/dL — ABNORMAL HIGH (ref 70–99)
Potassium: 3.6 mmol/L (ref 3.5–5.1)
Sodium: 140 mmol/L (ref 135–145)

## 2019-07-05 LAB — URINALYSIS, ROUTINE W REFLEX MICROSCOPIC
Bilirubin Urine: NEGATIVE
Glucose, UA: NEGATIVE mg/dL
Ketones, ur: NEGATIVE mg/dL
Leukocytes,Ua: NEGATIVE
Nitrite: NEGATIVE
Protein, ur: 100 mg/dL — AB
RBC / HPF: >50 RBC/hpf — ABNORMAL HIGH (ref 0–5)
Specific Gravity, Urine: 1.024 (ref 1.005–1.030)
pH: 5 (ref 5.0–8.0)

## 2019-07-05 IMAGING — CT CT RENAL STONE PROTOCOL
2 of 4 series · 16 of 46 positions shown, 18 images · non-contrast
Comparison: Renal ultrasound [DATE]. Abdominal CT [DATE]

CLINICAL DATA: Left flank pain and hematuria.

EXAM:
CT ABDOMEN AND PELVIS WITHOUT CONTRAST
TECHNIQUE: Multidetector CT imaging of the abdomen and pelvis was performed
following the standard protocol without IV contrast.

[Series 3: renal stone 5.0 · axial · 0.98mm/px · z∈[+791,+1266]mm · 13 of 105 slices shown, 15 images]
[im 5/105  soft-tissue]
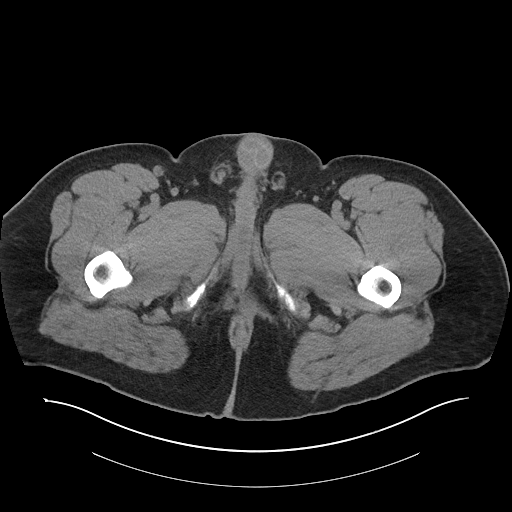
[im 5/105  bone]
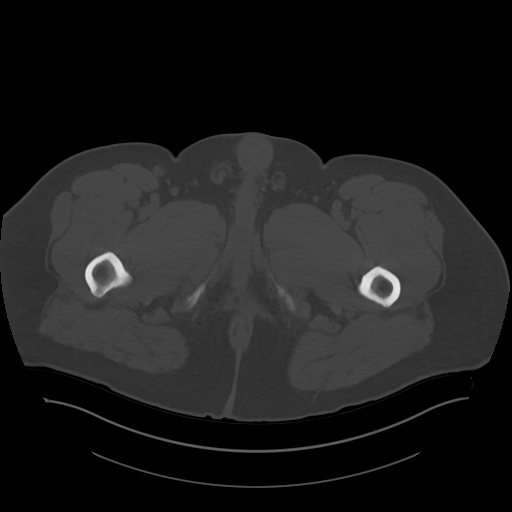
[im 13/105  soft-tissue]
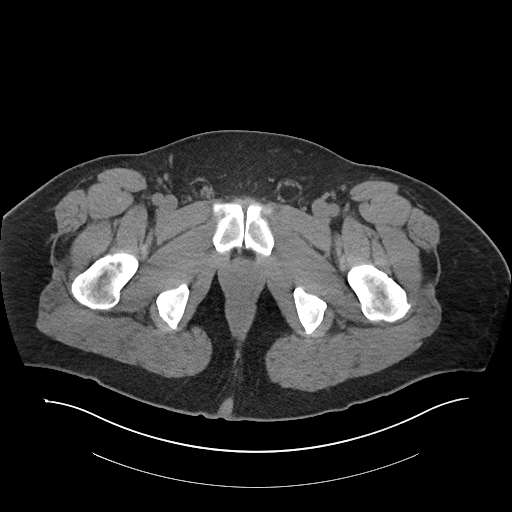
[im 21/105  soft-tissue]
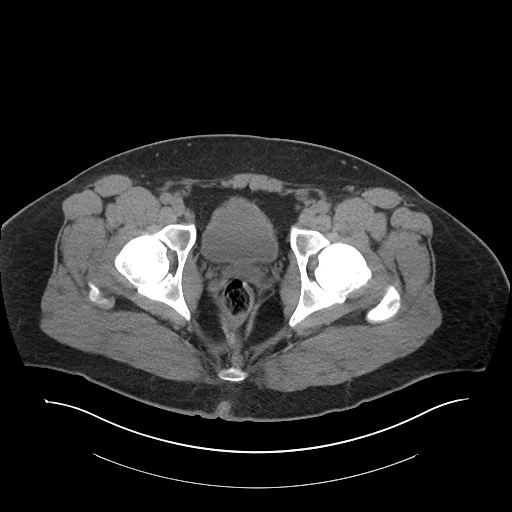
[im 30/105  soft-tissue]
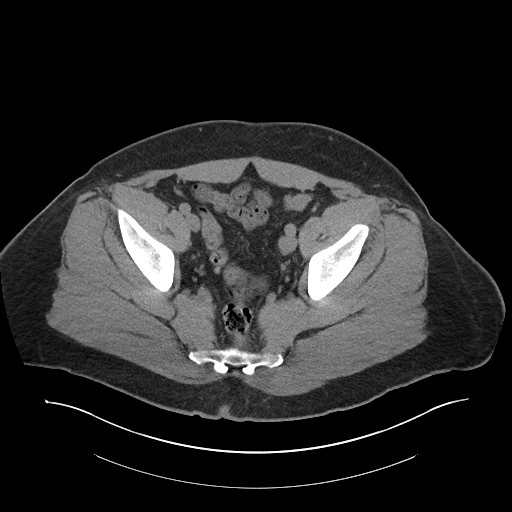
[im 38/105  soft-tissue]
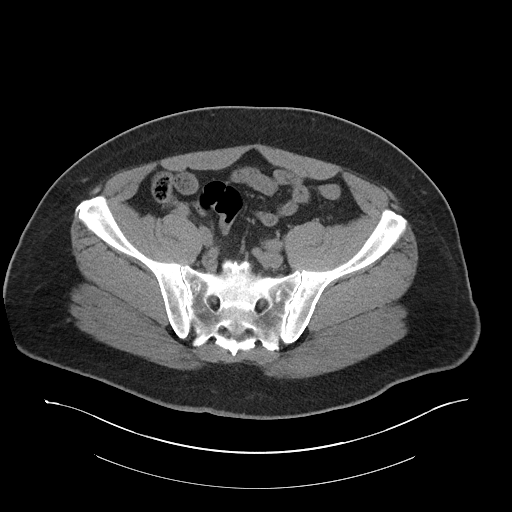
[im 46/105  soft-tissue]
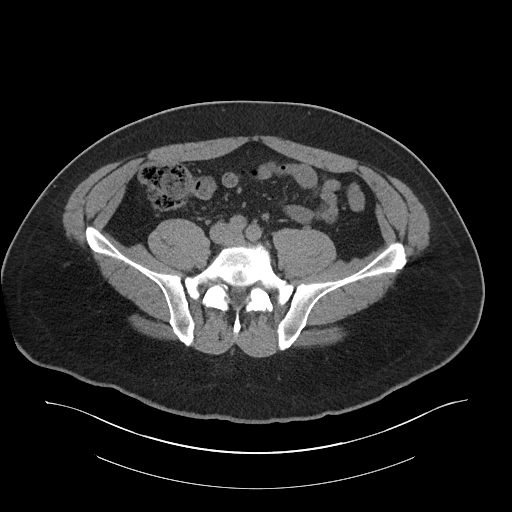
[im 55/105  soft-tissue]
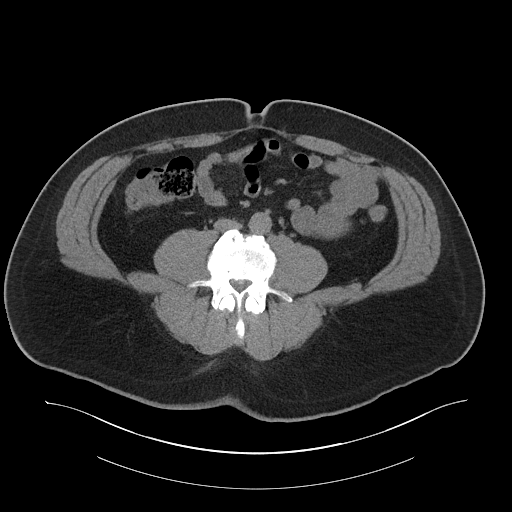
[im 59/105  soft-tissue]
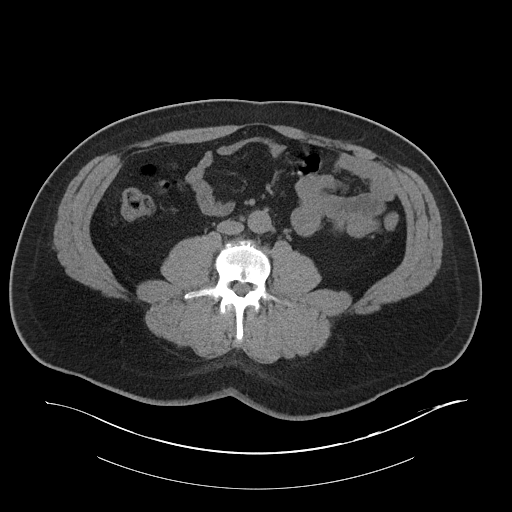
[im 67/105  soft-tissue]
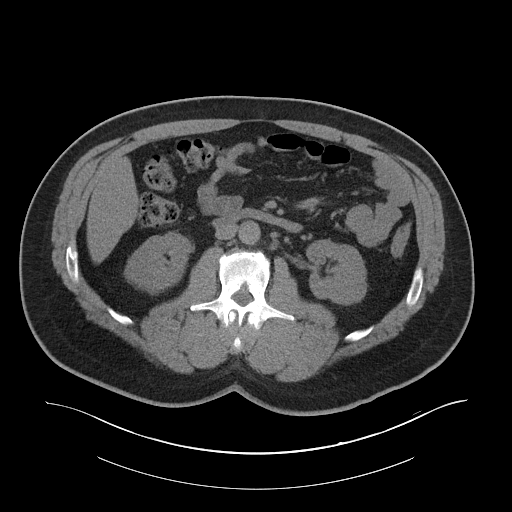
[im 67/105  bone]
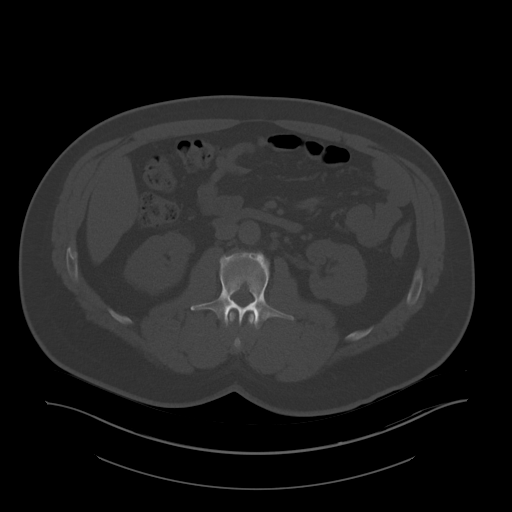
[im 75/105  soft-tissue]
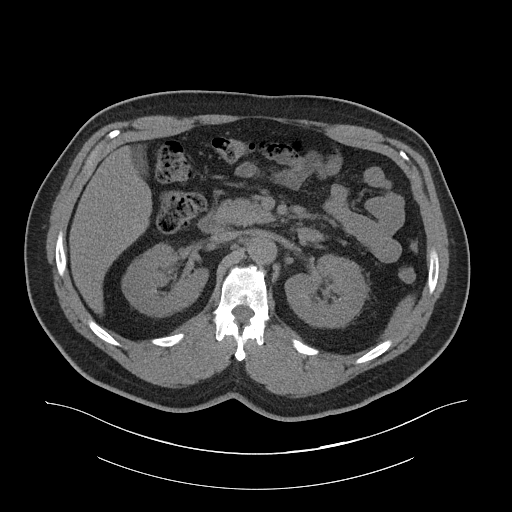
[im 84/105  soft-tissue]
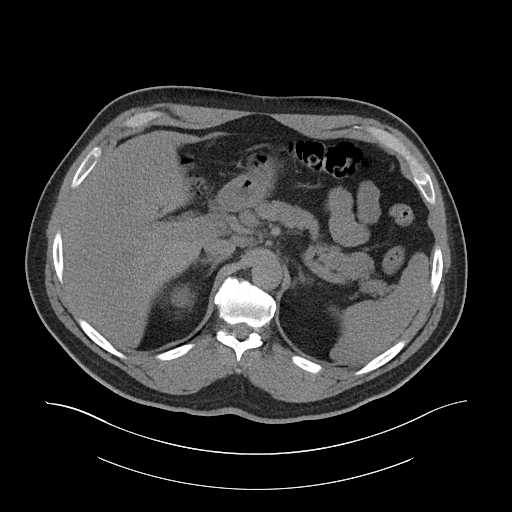
[im 92/105  soft-tissue]
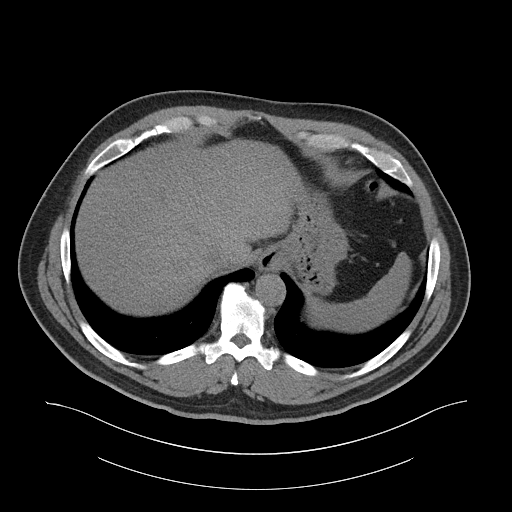
[im 100/105  soft-tissue]
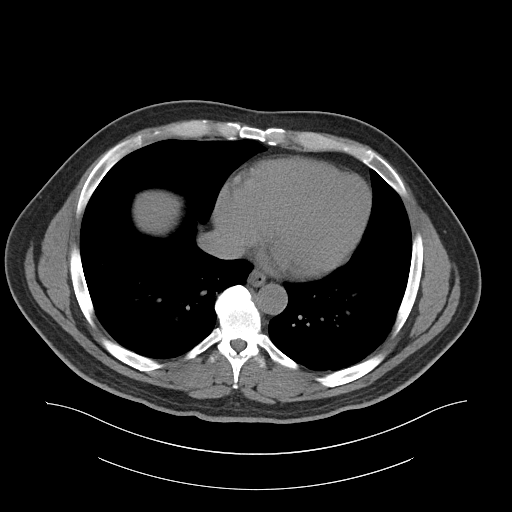

[Series 6: renal stone 3.0 cor · coronal · 0.97mm/px · 3 of 95 slices shown]
[im 32/95  soft-tissue]
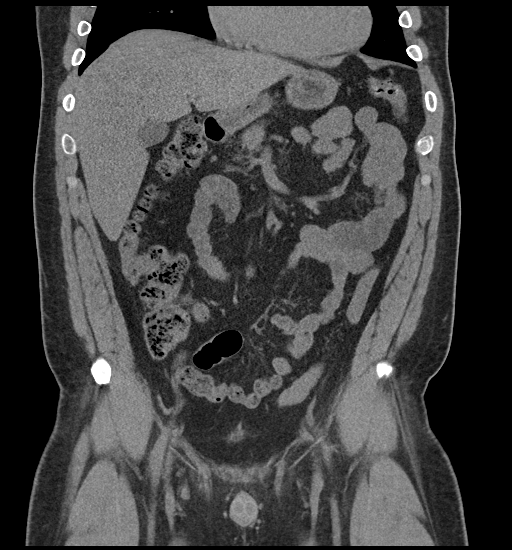
[im 42/95  soft-tissue]
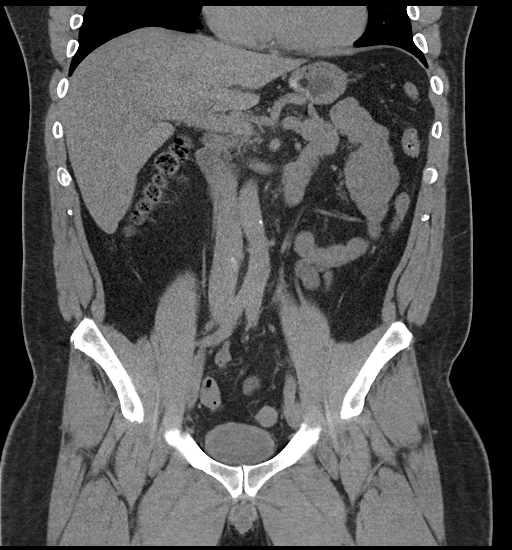
[im 53/95  soft-tissue]
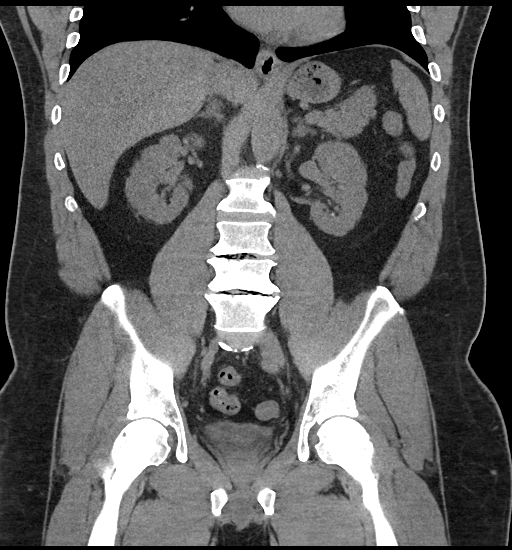

[16 of 46 positions shown; findings below may reference images not displayed]

FINDINGS: Lower chest: The lung bases are clear.

Hepatobiliary: Borderline mild hepatic steatosis. No focal lesion.
Gallbladder physiologically distended, no calcified stone. No
biliary dilatation.

Pancreas: No ductal dilatation or inflammation.

Spleen: Normal in size without focal abnormality.

Adrenals/Urinary Tract: Mild left adrenal thickening without
dominant nodule. Normal right adrenal gland. Multiple bilateral
nonobstructing intrarenal calculi. No hydronephrosis. No ureteral
calculi. Both ureters are decompressed. Urinary bladder is partially
distended. No bladder stone or wall thickening. No urethral stone
visualized.

Stomach/Bowel: Stomach is within normal limits. Appendix appears
normal. No evidence of bowel wall thickening, distention, or
inflammatory changes. Minimal distal colonic diverticulosis without
diverticulitis.

Vascular/Lymphatic: Abdominal aorta is normal in caliber. No
enlarged abdominopelvic lymph nodes.

Reproductive: Prostate is unremarkable.

Other: No ascites or free air. Minimal fat in the inguinal canals.
Small fat containing umbilical hernia.

Musculoskeletal: There are no acute or suspicious osseous
abnormalities. Multilevel degenerative change in the lumbar spine.
Vacuum phenomena at L3-L4 and L4-L5. Modic endplate changes at
L4-L5. Facet hypertrophy.
IMPRESSION: 1. Bilateral nonobstructing intrarenal calculi. No hydronephrosis or
obstructive uropathy.
2. Minimal distal colonic diverticulosis without diverticulitis.

## 2019-07-05 MED ORDER — ONDANSETRON HCL 4 MG/2ML IJ SOLN
4.0000 mg | Freq: Once | INTRAMUSCULAR | Status: AC
Start: 2019-07-05 — End: 2019-07-05
  Administered 2019-07-05: 06:00:00 4 mg via INTRAVENOUS
  Filled 2019-07-05: qty 2

## 2019-07-05 MED ORDER — HYDROMORPHONE HCL 1 MG/ML IJ SOLN
1.0000 mg | Freq: Once | INTRAMUSCULAR | Status: AC
  Administered 2019-07-05: 1 mg via INTRAVENOUS
  Filled 2019-07-05: qty 1

## 2019-07-05 MED ORDER — KETOROLAC TROMETHAMINE 15 MG/ML IJ SOLN
15.0000 mg | Freq: Once | INTRAMUSCULAR | Status: AC
  Administered 2019-07-05: 15 mg via INTRAVENOUS
  Filled 2019-07-05: qty 1

## 2019-07-05 MED ORDER — ONDANSETRON 4 MG PO TBDP: 4.0000 mg | ORAL_TABLET | Freq: Three times a day (TID) | ORAL | 0 refills | Status: DC | PRN

## 2019-07-05 MED ORDER — HYDROCODONE-ACETAMINOPHEN 5-325 MG PO TABS: 1.0000 | ORAL_TABLET | ORAL | 0 refills | Status: DC | PRN

## 2019-07-05 NOTE — ED Provider Notes (Signed)
  Physical Exam  BP 112/80   Pulse 69   Temp (!) 97.3 F (36.3 C) (Oral)   Resp 18   Ht 6\' 3"  (1.905 m)   Wt (!) 145.2 kg   SpO2 100%   BMI 40.00 kg/m   Physical Exam  ED Course/Procedures     Procedures  MDM  Patient care assumed from [place] at shift change, please see her note for full HPI.  Briefly, she with a prior history of kidney stones presents to the ED with left sided nagging pain, began this morning around 3 AM.  Patient has had CT renal which showed bilateral nonobstructing stones.  He is pending UA to rule out any infection.  Plan is for addition of antibiotics if urine does appear infected.   9:27 AM patient has provided a urine sample, currently waiting for UA to be processed by lab in order to further evaluate.  9:32 AM UA remarkable for large amount of hemoglobin, white count is 0-5, few bacteria no nitrites.  Urine was sent for culture will not be providing antibiotics at this time as they feel that results are likely compatible with stage of a kidney stone.  Patient was informed of the globin present on his urine, he will need to follow-up with his PCP in order to follow resolution of this.  Patient understands and agrees with management, discharged in stable condition.   Portions of this note were generated with [place]. Dictation errors may occur despite best attempts at proofreading.            Scientist, clinical (histocompatibility and immunogenetics), [name] 07/05/19 07/07/19    Ward, [id number], DO 07/08/19 2307

## 2019-07-05 NOTE — ED Provider Notes (Signed)
[facility] EMERGENCY DEPARTMENT Provider Note   CSN: [id number] Arrival date & time: 07/05/19  0457     History Chief Complaint  Patient presents with  . Flank Pain    [name] is a 49 y.o. male.  The history is provided by the patient and medical records.  Flank Pain   49 year old male with history of arthritis, hypertension, sleep apnea, kidney stones, presenting to the ED with left flank pain.  States for the past few days he has had a nagging pain in his left side, attributed this to some of his chronic back issues.  States he woke up around 3:00 this morning and had severe left flank pain, much worse than prior, and gross hematuria.  States it feels like when he had kidney stones previously.  He has had some nausea but denies vomiting.  No fever or chills.  Kidney stones in the past have all passed spontaneously.  He has not had any medications prior to arrival.  Past Medical History:  Diagnosis Date  . Arthritis    neck  . Carpal tunnel syndrome on both sides 08/2014  . DDD (degenerative disc disease), lumbar   . History of kidney stones   . Hypertension    under control with med., has been on med. x 1 yr.  . Sleep apnea    CPAP but does not use    There are no problems to display for this patient.   Past Surgical History:  Procedure Laterality Date  . CARPAL TUNNEL RELEASE Left 09/07/2014   Procedure: LEFT CARPAL TUNNEL RELEASE;  Surgeon: Roseanne Kaufman, MD;  Location: [place];  Service: Orthopedics;  Laterality: Left;  . CARPAL TUNNEL RELEASE Right 10/12/2014   Procedure: RIGHT CARPAL TUNNEL RELEASE;  Surgeon: Roseanne Kaufman, MD;  Location: [place];  Service: Orthopedics;  Laterality: Right;  . KNEE ARTHROSCOPY Left        No family history on file.  Social History   Tobacco Use  . Smoking status: Never Smoker  . Smokeless tobacco: Never Used  Substance Use Topics  . Alcohol use: Yes   Comment: occasionally  . Drug use: No    Home Medications Prior to Admission medications   Medication Sig Start Date End Date Taking? Authorizing Provider  atorvastatin (LIPITOR) 10 MG tablet Take 10 mg by mouth daily.    [provider]  HYDROcodone-acetaminophen (NORCO) 5-325 MG tablet Take 1 tablet by mouth every 6 (six) hours as needed for severe pain. 05/05/17   Street, Mercedes, [name]  ibuprofen (ADVIL,MOTRIN) 800 MG tablet Take 1 tablet (800 mg total) by mouth every 8 (eight) hours as needed for moderate pain. 06/02/18   Little, Wenda Overland, MD  losartan (COZAAR) 50 MG tablet Take 50 mg by mouth daily.    [provider]  ondansetron (ZOFRAN ODT) 4 MG disintegrating tablet Take 1 tablet (4 mg total) by mouth every 8 (eight) hours as needed for nausea or vomiting. 06/02/18   Little, Wenda Overland, MD  oxyCODONE-acetaminophen (PERCOCET) 5-325 MG tablet Take 1 tablet by mouth every 4 (four) hours as needed for moderate pain. 97/98/92   Delora Fuel, MD  tamsulosin (FLOMAX) 0.4 MG CAPS capsule Take 1 capsule (0.4 mg total) by mouth daily after supper. Take until the stone passes, then stop taking 11/94/17   Delora Fuel, MD    Allergies    Patient has no known allergies.  Review of Systems   Review of  Systems  Genitourinary: Positive for flank pain and hematuria.  All other systems reviewed and are negative.   Physical Exam Updated Vital Signs BP (!) 171/113 (BP Location: Right Arm)   Pulse 69   Temp (!) 97.3 F (36.3 C) (Oral)   Resp 18   SpO2 98%   Physical Exam Vitals and nursing note reviewed.  Constitutional:      Appearance: He is well-developed.     Comments: Appears uncomfortable  HENT:     Head: Normocephalic and atraumatic.  Eyes:     Conjunctiva/sclera: Conjunctivae normal.     Pupils: Pupils are equal, round, and reactive to light.  Cardiovascular:     Rate and Rhythm: Normal rate and regular rhythm.     Heart sounds: Normal heart sounds.    Pulmonary:     Effort: Pulmonary effort is normal. No respiratory distress.     Breath sounds: Normal breath sounds. No rhonchi.  Abdominal:     General: Bowel sounds are normal.     Palpations: Abdomen is soft.  Musculoskeletal:        General: Normal range of motion.     Cervical back: Normal range of motion.  Skin:    General: Skin is warm and dry.  Neurological:     Mental Status: He is alert and oriented to person, place, and time.     ED Results / Procedures / Treatments   Labs (all labs ordered are listed, but only abnormal results are displayed) Labs Reviewed  BASIC METABOLIC PANEL - Abnormal; Notable for the following components:      Result Value   Glucose, Bld 119 (*)    All other components within normal limits  CBC WITH DIFFERENTIAL/PLATELET  URINALYSIS, ROUTINE W REFLEX MICROSCOPIC    EKG None  Radiology CT Renal Stone Study  Result Date: 07/05/2019 CLINICAL DATA:  Left flank pain and hematuria. EXAM: CT ABDOMEN AND PELVIS WITHOUT CONTRAST TECHNIQUE: Multidetector CT imaging of the abdomen and pelvis was performed following the standard protocol without IV contrast. COMPARISON:  Renal ultrasound 06/02/2018. Abdominal CT 05/05/2017 FINDINGS: Lower chest: The lung bases are clear. Hepatobiliary: Borderline mild hepatic steatosis. No focal lesion. Gallbladder physiologically distended, no calcified stone. No biliary dilatation. Pancreas: No ductal dilatation or inflammation. Spleen: Normal in size without focal abnormality. Adrenals/Urinary Tract: Mild left adrenal thickening without dominant nodule. Normal right adrenal gland. Multiple bilateral nonobstructing intrarenal calculi. No hydronephrosis. No ureteral calculi. Both ureters are decompressed. Urinary bladder is partially distended. No bladder stone or wall thickening. No urethral stone visualized. Stomach/Bowel: Stomach is within normal limits. Appendix appears normal. No evidence of bowel wall thickening,  distention, or inflammatory changes. Minimal distal colonic diverticulosis without diverticulitis. Vascular/Lymphatic: Abdominal aorta is normal in caliber. No enlarged abdominopelvic lymph nodes. Reproductive: Prostate is unremarkable. Other: No ascites or free air. Minimal fat in the inguinal canals. Small fat containing umbilical hernia. Musculoskeletal: There are no acute or suspicious osseous abnormalities. Multilevel degenerative change in the lumbar spine. Vacuum phenomena at L3-L4 and L4-L5. Modic endplate changes at L4-L5. Facet hypertrophy. IMPRESSION: 1. Bilateral nonobstructing intrarenal calculi. No hydronephrosis or obstructive uropathy. 2. Minimal distal colonic diverticulosis without diverticulitis. Electronically Signed   By: Narda Rutherford M.D.   On: 07/05/2019 05:42    Procedures Procedures (including critical care time)  Medications Ordered in ED Medications  HYDROmorphone (DILAUDID) injection 1 mg (1 mg Intravenous Given 07/05/19 0547)  ondansetron (ZOFRAN) injection 4 mg (4 mg Intravenous Given 07/05/19 0547)  ED Course  I have reviewed the triage vital signs and the nursing notes.  Pertinent labs & imaging results that were available during my care of the patient were reviewed by me and considered in my medical decision making (see chart for details).    MDM Rules/Calculators/A&P  49 year old male presenting to the ED with sudden onset left flank pain around 3 AM.  History of kidney stones reports this feels similar.  Also reports gross hematuria prior to arrival.  He is afebrile and nontoxic but does appear uncomfortable.  Basic labs were obtained and are overall reassuring.  CT renal stone study obtained, bilateral nonobstructing intrarenal calculi, no hydronephrosis or obstructive uropathy noted.  Awaiting urinalysis.  UA pending.  Care signed out to oncoming provider to follow-up on results.  If infectious, plan to add abx.  Otherwise home with pain/nausea meds and  urology follow-up.  Final Clinical Impression(s) / ED Diagnoses Final diagnoses:  Left flank pain    Rx / DC Orders ED Discharge Orders         Ordered    HYDROcodone-acetaminophen (NORCO/VICODIN) 5-325 MG tablet  Every 4 hours PRN     07/05/19 0636    ondansetron (ZOFRAN ODT) 4 MG disintegrating tablet  Every 8 hours PRN     07/05/19 0636           Garlon Hatchet, [name] 07/05/19 0643    Ward, Layla Maw, DO 07/05/19 [phone]

## 2019-07-05 NOTE — ED Triage Notes (Signed)
Patient woke up this morning with left flank pain and hematuria , pt. stated pain similar to kidney stone in the past , denies fever or chills .

## 2019-07-05 NOTE — Discharge Instructions (Addendum)
Take the prescribed medication as directed. Follow-up with urology if any ongoing issues. Return to the ED for new or worsening symptoms. 

## 2019-07-06 LAB — URINE CULTURE: Culture: NO GROWTH

## 2019-07-31 ENCOUNTER — Encounter: Payer: Self-pay | Admitting: Neurology

## 2019-07-31 ENCOUNTER — Other Ambulatory Visit: Payer: Self-pay

## 2019-07-31 ENCOUNTER — Ambulatory Visit: Payer: 59 | Admitting: Neurology

## 2019-07-31 ENCOUNTER — Telehealth: Payer: Self-pay | Admitting: Neurology

## 2019-07-31 VITALS — BP 132/81 | HR 81 | Ht 75.0 in | Wt 317.6 lb

## 2019-07-31 DIAGNOSIS — M21371 Foot drop, right foot: Secondary | ICD-10-CM

## 2019-07-31 DIAGNOSIS — M5417 Radiculopathy, lumbosacral region: Secondary | ICD-10-CM | POA: Diagnosis not present

## 2019-07-31 DIAGNOSIS — M4306 Spondylolysis, lumbar region: Secondary | ICD-10-CM | POA: Diagnosis not present

## 2019-07-31 DIAGNOSIS — M5416 Radiculopathy, lumbar region: Secondary | ICD-10-CM

## 2019-07-31 NOTE — Telephone Encounter (Signed)
Patient wanted to see if he could have a brace called in for him at a medical supply store? Could a prescription be faxed over? Please Advise. Thank you

## 2019-07-31 NOTE — Patient Instructions (Signed)
We will refer you for second opinion at [facility].  Their office will contact you with an appointment.

## 2019-07-31 NOTE — Telephone Encounter (Signed)
Please inform him to take the prescription for right ankle-foot-orthotic to [facility], they can fit him for it there.

## 2019-07-31 NOTE — Progress Notes (Signed)
[facility] Neurology [facility] Clinic Note - Initial Visit   Date: 07/31/19  [name] MRN: [id number] DOB: [birth year]   Dear Dr. Ethelene Hal:  Thank you for your kind referral of [name] for consultation of right foot drop. Although his history is well known to you, please allow [place] to reiterate it for the purpose of our medical record. The patient was accompanied to the clinic by self.  History of Present Illness: [name] is a 49 y.o. right-handed male with hypertension and chronic low back pain presenting for evaluation of right foot drop.   Starting around late 2019, he began having weakness in the right foot, such that he was dragging the foot and unable to raise his toes.  Symptoms slowly progressed over the a few months and since around the summer of 2020, he has no strength to extend the right ankle and toes. Pushing the toes down is still strong.  He does not have similar weakness in the left foot.  He has chronic low back pain and has received a series of ESI with transient benefit in pain. He did some physical therapy, but tried to do more at home.  He has not suffered any falls and is careful when walking.  He does not use a AFO.  He has been followed by Dr. Shon Baton and had two MRI lumbar spine from 2019 and again 2020, which show multilevel degenerative changes in the lumbosacral region with central canal and foraminal stenosis at the L3-4, L4-5, and L5-S1 levels.  He is here for my opinion on management options.   He works in Production designer, theatre/television/film.    Out-side paper records, electronic medical record, and images have been reviewed where available and summarized as:  MRI lumbar spine 07/02/2017: 1.  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 2.  Moderate central canal stenosis at L3-4 associated with posterior element hypertrophy, moderate disc bulge with superimposed minimal caudal extrusion  and mild retrolisthesis of L3 on L4. 3.  Moderate central canal stenosis at L4-5. 4.  Disc bulge and endplate osteophyte combines with posterior element hypertrophy resulting in multilevel foraminal stenosis.  Moderate bilateral L3-4, severe left and moderate right L4-5 stenosis.  MRI lumbar spine 04/01/2019:   Severe right lateral recess stenosis and degenerative disc disease at L3-4.  Moderate to significant foraminal stenosis right worse than left at L3-4. L4-5: Moderate right and severe left foraminal or nerve root.  Moderate to severe levels on the right side.  Arthritic osteophyte 20 disease is noted.  Small left  Lab Results  Component Value Date   ESRSEDRATE 3 02/14/2007    Past Medical History:  Diagnosis Date  . Arthritis    neck  . Carpal tunnel syndrome on both sides 08/2014  . DDD (degenerative disc disease), lumbar   . History of kidney stones   . Hypertension    under control with med., has been on med. x 1 yr.  . Sleep apnea    CPAP but does not use    Past Surgical History:  Procedure Laterality Date  . CARPAL TUNNEL RELEASE Left 09/07/2014   Procedure: LEFT CARPAL TUNNEL RELEASE;  Surgeon: [name], MD;  Location: [facility];  Service: Orthopedics;  Laterality: Left;  . CARPAL TUNNEL RELEASE Right 10/12/2014   Procedure: RIGHT CARPAL TUNNEL RELEASE;  Surgeon: [name], MD;  Location: [facility];  Service: Orthopedics;  Laterality: Right;  .  KNEE ARTHROSCOPY Left      Medications:  Outpatient Encounter Medications as of 07/31/2019  Medication Sig  . atorvastatin (LIPITOR) 10 MG tablet Take 10 mg by mouth daily.  Marland Kitchen ibuprofen (ADVIL,MOTRIN) 800 MG tablet Take 1 tablet (800 mg total) by mouth every 8 (eight) hours as needed for moderate pain.  Marland Kitchen losartan (COZAAR) 50 MG tablet Take 50 mg by mouth daily.  Marland Kitchen HYDROcodone-acetaminophen (NORCO/VICODIN) 5-325 MG tablet Take 1 tablet by mouth every 4 (four) hours as needed.  (Patient not taking: Reported on 07/31/2019)  . ondansetron (ZOFRAN ODT) 4 MG disintegrating tablet Take 1 tablet (4 mg total) by mouth every 8 (eight) hours as needed for nausea. (Patient not taking: Reported on 07/31/2019)  . oxyCODONE-acetaminophen (PERCOCET) 5-325 MG tablet Take 1 tablet by mouth every 4 (four) hours as needed for moderate pain. (Patient not taking: Reported on 07/31/2019)  . [DISCONTINUED] tamsulosin (FLOMAX) 0.4 MG CAPS capsule Take 1 capsule (0.4 mg total) by mouth daily after supper. Take until the stone passes, then stop taking   No facility-administered encounter medications on file as of 07/31/2019.    Allergies: No Known Allergies  Family History: No family history on file.  Social History: Social History   Tobacco Use  . Smoking status: Never Smoker  . Smokeless tobacco: Never Used  Substance Use Topics  . Alcohol use: Yes    Comment: occasionally  . Drug use: No   Social History   Social History Narrative   Right handed    Lives with wife     Vital Signs:  BP 132/81   Pulse 81   Ht 6\' 3"  (1.905 m)   Wt (!) 317 lb 9.6 oz (144.1 kg)   SpO2 97%   BMI 39.70 kg/m   Neurological Exam: MENTAL STATUS including orientation to time, place, person, recent and remote memory, attention span and concentration, language, and fund of knowledge is normal.  Speech is not dysarthric.  CRANIAL NERVES: II:  No visual field defects.   III-IV-VI: Pupils equal round and reactive to light.  Normal conjugate, extra-ocular eye movements in all directions of gaze.  No nystagmus.  No ptosis.   V:  Normal facial sensation.    VII:  Normal facial symmetry and movements.   VIII:  Normal hearing and vestibular function.   IX-X:  Normal palatal movement.   XI:  Normal shoulder shrug and head rotation.   XII:  Normal tongue strength and range of motion, no deviation or fasciculation.  MOTOR:  No atrophy, fasciculations or abnormal movements.  No pronator drift.   Upper  Extremity:  Right  Left  Deltoid  5/5   5/5   Biceps  5/5   5/5   Triceps  5/5   5/5   Infraspinatus 5/5  5/5  Medial pectoralis 5/5  5/5  Wrist extensors  5/5   5/5   Wrist flexors  5/5   5/5   Finger extensors  5/5   5/5   Finger flexors  5/5   5/5   Dorsal interossei  5/5   5/5   Abductor pollicis  5/5   5/5   Tone (Ashworth scale)  0  0   Lower Extremity:  Right  Left  Hip flexors  5/5   5/5   Hip extensors  5/5   5/5   Adductor 5/5  5/5  Abductor 5/5  5/5  Knee flexors  5/5   5/5   Knee extensors  5/5  5/5   Dorsiflexors  0/5   5/5   Plantarflexors  5/5   5/5   Inversion 4-/5  5/5  Eversion 3/5  5/5  Toe extensors  0/5   5/5   Toe flexors  5/5   5/5   Tone (Ashworth scale)  0  0   MSRs:  Right        Left                  brachioradialis 2+  2+  biceps 2+  2+  triceps 2+  2+  patellar 3+  3+  ankle jerk 2+  2+  Hoffman no  no  plantar response down  down   SENSORY:  Hyperesthesia to vibration at the right great toe, as compared to the left.  Reduced pin prick over the dorsum of the right foot.  Otherwise, light touch, pinprick, vibration, and proprioception is intact.   COORDINATION/GAIT: Normal finger-to- nose-finger.  High steppage gait on the right, stable and unassisted.  He can perform toe walking and tandem walking.  He is unable to stand on heels on the right foot.   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  Exam shows severe right foot drop with no activation appreciated with dorsiflexion or toe extension.  Right foot inversion and eversion are weak, but still movement is present.  I discussed that given the severity of findings, conservative therapies will not help. I would recommend that he talk to Dr. Shon Baton about role of surgery.  I also informed him that surgery may not reverse any of the deficits that he already  has, but that it would prevent it from getting worse and help alleviate pain.  He would like to seek a second opinion from a surgeon to address his questions and concern.  I will refer him to [facility] and he may make a decision from there as to how he would like to proceed.  Prescription for right AFO provided.    Thank you for allowing me to participate in patient's care.  If I can answer any additional questions, I would be pleased to do so.    Sincerely,    Keonta Alsip K. Allena Katz, DO

## 2019-08-01 NOTE — Addendum Note (Signed)
Addended by: Kandice Robinsons T on: 08/01/2019 10:50 AM   Modules accepted: Orders

## 2019-11-15 ENCOUNTER — Telehealth: Payer: Self-pay | Admitting: Cardiovascular Disease

## 2019-11-15 NOTE — Telephone Encounter (Signed)
Spoke to pt who report he has been experiencing on and off chest pain for the past two days. Pt denies SOB and dizziness but report symptoms has returned and constant on the left side. Nurse advised that pt based on symptoms to seek emergent care. Pt verbalized understanding.  Trish notified.

## 2019-11-15 NOTE — Telephone Encounter (Signed)
Pt c/o of Chest Pain: STAT if CP now or developed within 24 hours  1. Are you having CP right now? yes  2. Are you experiencing any other symptoms (ex. SOB, nausea, vomiting, sweating)?  3. How long have you been experiencing CP?  4. Is your CP continuous or coming and going?   5. Have you taken Nitroglycerin?  ? 

## 2019-11-16 ENCOUNTER — Emergency Department (HOSPITAL_COMMUNITY): Payer: 59

## 2019-11-16 ENCOUNTER — Other Ambulatory Visit: Payer: Self-pay

## 2019-11-16 ENCOUNTER — Inpatient Hospital Stay (HOSPITAL_COMMUNITY)
Admission: EM | Admit: 2019-11-16 | Discharge: 2019-11-22 | DRG: 023 | Disposition: A | Discharge status: Rehabilitation Facility | Payer: 59 | Attending: Neurology | Admitting: Neurology

## 2019-11-16 ENCOUNTER — Encounter (HOSPITAL_COMMUNITY): Payer: Self-pay

## 2019-11-16 ENCOUNTER — Inpatient Hospital Stay (HOSPITAL_COMMUNITY): Payer: 59

## 2019-11-16 DIAGNOSIS — E785 Hyperlipidemia, unspecified: Secondary | ICD-10-CM | POA: Diagnosis present

## 2019-11-16 DIAGNOSIS — D72829 Elevated white blood cell count, unspecified: Secondary | ICD-10-CM | POA: Diagnosis present

## 2019-11-16 DIAGNOSIS — G935 Compression of brain: Secondary | ICD-10-CM | POA: Diagnosis not present

## 2019-11-16 DIAGNOSIS — G8191 Hemiplegia, unspecified affecting right dominant side: Secondary | ICD-10-CM | POA: Diagnosis not present

## 2019-11-16 DIAGNOSIS — F329 Major depressive disorder, single episode, unspecified: Secondary | ICD-10-CM | POA: Diagnosis present

## 2019-11-16 DIAGNOSIS — Z823 Family history of stroke: Secondary | ICD-10-CM

## 2019-11-16 DIAGNOSIS — I440 Atrioventricular block, first degree: Secondary | ICD-10-CM | POA: Diagnosis present

## 2019-11-16 DIAGNOSIS — Q211 Atrial septal defect: Secondary | ICD-10-CM

## 2019-11-16 DIAGNOSIS — F419 Anxiety disorder, unspecified: Secondary | ICD-10-CM | POA: Diagnosis present

## 2019-11-16 DIAGNOSIS — I253 Aneurysm of heart: Secondary | ICD-10-CM

## 2019-11-16 DIAGNOSIS — Z20822 Contact with and (suspected) exposure to covid-19: Secondary | ICD-10-CM | POA: Diagnosis present

## 2019-11-16 DIAGNOSIS — R233 Spontaneous ecchymoses: Secondary | ICD-10-CM | POA: Diagnosis present

## 2019-11-16 DIAGNOSIS — I639 Cerebral infarction, unspecified: Secondary | ICD-10-CM | POA: Diagnosis not present

## 2019-11-16 DIAGNOSIS — R0682 Tachypnea, not elsewhere classified: Secondary | ICD-10-CM | POA: Diagnosis present

## 2019-11-16 DIAGNOSIS — R112 Nausea with vomiting, unspecified: Secondary | ICD-10-CM | POA: Diagnosis present

## 2019-11-16 DIAGNOSIS — Z6841 Body Mass Index (BMI) 40.0 and over, adult: Secondary | ICD-10-CM

## 2019-11-16 DIAGNOSIS — Z8249 Family history of ischemic heart disease and other diseases of the circulatory system: Secondary | ICD-10-CM

## 2019-11-16 DIAGNOSIS — M51369 Other intervertebral disc degeneration, lumbar region without mention of lumbar back pain or lower extremity pain: Secondary | ICD-10-CM | POA: Diagnosis present

## 2019-11-16 DIAGNOSIS — F1290 Cannabis use, unspecified, uncomplicated: Secondary | ICD-10-CM | POA: Diagnosis present

## 2019-11-16 DIAGNOSIS — I69351 Hemiplegia and hemiparesis following cerebral infarction affecting right dominant side: Secondary | ICD-10-CM | POA: Diagnosis not present

## 2019-11-16 DIAGNOSIS — R29700 NIHSS score 0: Secondary | ICD-10-CM | POA: Diagnosis present

## 2019-11-16 DIAGNOSIS — M5116 Intervertebral disc disorders with radiculopathy, lumbar region: Secondary | ICD-10-CM | POA: Diagnosis present

## 2019-11-16 DIAGNOSIS — I6389 Other cerebral infarction: Secondary | ICD-10-CM | POA: Diagnosis not present

## 2019-11-16 DIAGNOSIS — R260 Ataxic gait: Secondary | ICD-10-CM | POA: Diagnosis present

## 2019-11-16 DIAGNOSIS — R519 Headache, unspecified: Secondary | ICD-10-CM | POA: Diagnosis present

## 2019-11-16 DIAGNOSIS — G936 Cerebral edema: Secondary | ICD-10-CM | POA: Diagnosis present

## 2019-11-16 DIAGNOSIS — Z7289 Other problems related to lifestyle: Secondary | ICD-10-CM | POA: Diagnosis not present

## 2019-11-16 DIAGNOSIS — I10 Essential (primary) hypertension: Secondary | ICD-10-CM | POA: Diagnosis present

## 2019-11-16 DIAGNOSIS — G911 Obstructive hydrocephalus: Secondary | ICD-10-CM | POA: Diagnosis not present

## 2019-11-16 DIAGNOSIS — R079 Chest pain, unspecified: Secondary | ICD-10-CM | POA: Diagnosis present

## 2019-11-16 DIAGNOSIS — Q2112 Patent foramen ovale: Secondary | ICD-10-CM

## 2019-11-16 DIAGNOSIS — I63531 Cerebral infarction due to unspecified occlusion or stenosis of right posterior cerebral artery: Secondary | ICD-10-CM | POA: Diagnosis not present

## 2019-11-16 DIAGNOSIS — G4733 Obstructive sleep apnea (adult) (pediatric): Secondary | ICD-10-CM | POA: Diagnosis present

## 2019-11-16 DIAGNOSIS — Z8673 Personal history of transient ischemic attack (TIA), and cerebral infarction without residual deficits: Secondary | ICD-10-CM

## 2019-11-16 DIAGNOSIS — G441 Vascular headache, not elsewhere classified: Secondary | ICD-10-CM

## 2019-11-16 DIAGNOSIS — M5416 Radiculopathy, lumbar region: Secondary | ICD-10-CM | POA: Diagnosis present

## 2019-11-16 DIAGNOSIS — Z79899 Other long term (current) drug therapy: Secondary | ICD-10-CM

## 2019-11-16 DIAGNOSIS — T68XXXA Hypothermia, initial encounter: Secondary | ICD-10-CM | POA: Diagnosis present

## 2019-11-16 DIAGNOSIS — G9389 Other specified disorders of brain: Secondary | ICD-10-CM | POA: Diagnosis present

## 2019-11-16 DIAGNOSIS — I6340 Cerebral infarction due to embolism of unspecified cerebral artery: Principal | ICD-10-CM | POA: Diagnosis present

## 2019-11-16 DIAGNOSIS — H8190 Unspecified disorder of vestibular function, unspecified ear: Secondary | ICD-10-CM

## 2019-11-16 DIAGNOSIS — E876 Hypokalemia: Secondary | ICD-10-CM | POA: Diagnosis present

## 2019-11-16 DIAGNOSIS — K5900 Constipation, unspecified: Secondary | ICD-10-CM | POA: Diagnosis not present

## 2019-11-16 DIAGNOSIS — R7401 Elevation of levels of liver transaminase levels: Secondary | ICD-10-CM | POA: Diagnosis not present

## 2019-11-16 DIAGNOSIS — M1909 Primary osteoarthritis, other specified site: Secondary | ICD-10-CM | POA: Diagnosis present

## 2019-11-16 DIAGNOSIS — F418 Other specified anxiety disorders: Secondary | ICD-10-CM | POA: Diagnosis present

## 2019-11-16 DIAGNOSIS — M5136 Other intervertebral disc degeneration, lumbar region: Secondary | ICD-10-CM | POA: Diagnosis present

## 2019-11-16 DIAGNOSIS — R000 Tachycardia, unspecified: Secondary | ICD-10-CM | POA: Diagnosis present

## 2019-11-16 HISTORY — DX: Cerebral infarction, unspecified: I63.9

## 2019-11-16 LAB — URINALYSIS, ROUTINE W REFLEX MICROSCOPIC
Bacteria, UA: NONE SEEN
Bilirubin Urine: NEGATIVE
Glucose, UA: >=500 mg/dL — AB
Hgb urine dipstick: NEGATIVE
Ketones, ur: 5 mg/dL — AB
Leukocytes,Ua: NEGATIVE
Nitrite: NEGATIVE
Protein, ur: NEGATIVE mg/dL
Specific Gravity, Urine: 1.015 (ref 1.005–1.030)
pH: 6 (ref 5.0–8.0)

## 2019-11-16 LAB — RAPID URINE DRUG SCREEN, HOSP PERFORMED
Amphetamines: NOT DETECTED
Barbiturates: NOT DETECTED
Benzodiazepines: NOT DETECTED
Cocaine: NOT DETECTED
Opiates: NOT DETECTED
Tetrahydrocannabinol: POSITIVE — AB

## 2019-11-16 LAB — CBC
HCT: 47.7 % (ref 39.0–52.0)
Hemoglobin: 15.7 g/dL (ref 13.0–17.0)
MCH: 31.6 pg (ref 26.0–34.0)
MCHC: 32.9 g/dL (ref 30.0–36.0)
MCV: 96 fL (ref 80.0–100.0)
Platelets: 201 10*3/uL (ref 150–400)
RBC: 4.97 MIL/uL (ref 4.22–5.81)
RDW: 12.3 % (ref 11.5–15.5)
WBC: 8.6 10*3/uL (ref 4.0–10.5)
nRBC: 0 % (ref 0.0–0.2)

## 2019-11-16 LAB — CBC WITH DIFFERENTIAL/PLATELET
Abs Immature Granulocytes: 0.02 10*3/uL (ref 0.00–0.07)
Basophils Absolute: 0 10*3/uL (ref 0.0–0.1)
Basophils Relative: 0 %
Eosinophils Absolute: 0.1 10*3/uL (ref 0.0–0.5)
Eosinophils Relative: 2 %
HCT: 45.6 % (ref 39.0–52.0)
Hemoglobin: 15.4 g/dL (ref 13.0–17.0)
Immature Granulocytes: 0 %
Lymphocytes Relative: 28 %
Lymphs Abs: 1.5 10*3/uL (ref 0.7–4.0)
MCH: 31.4 pg (ref 26.0–34.0)
MCHC: 33.8 g/dL (ref 30.0–36.0)
MCV: 93.1 fL (ref 80.0–100.0)
Monocytes Absolute: 0.5 10*3/uL (ref 0.1–1.0)
Monocytes Relative: 10 %
Neutro Abs: 3.1 10*3/uL (ref 1.7–7.7)
Neutrophils Relative %: 60 %
Platelets: 219 10*3/uL (ref 150–400)
RBC: 4.9 MIL/uL (ref 4.22–5.81)
RDW: 12.1 % (ref 11.5–15.5)
WBC: 5.3 10*3/uL (ref 4.0–10.5)
nRBC: 0 % (ref 0.0–0.2)

## 2019-11-16 LAB — COMPREHENSIVE METABOLIC PANEL
ALT: 48 U/L — ABNORMAL HIGH (ref 0–44)
AST: 34 U/L (ref 15–41)
Albumin: 4 g/dL (ref 3.5–5.0)
Alkaline Phosphatase: 68 U/L (ref 38–126)
Anion gap: 13 (ref 5–15)
BUN: 11 mg/dL (ref 6–20)
CO2: 20 mmol/L — ABNORMAL LOW (ref 22–32)
Calcium: 8.7 mg/dL — ABNORMAL LOW (ref 8.9–10.3)
Chloride: 107 mmol/L (ref 98–111)
Creatinine, Ser: 0.98 mg/dL (ref 0.61–1.24)
GFR calc Af Amer: >60 mL/min (ref >60)
GFR calc non Af Amer: >60 mL/min (ref >60)
Glucose, Bld: 189 mg/dL — ABNORMAL HIGH (ref 70–99)
Potassium: 3.3 mmol/L — ABNORMAL LOW (ref 3.5–5.1)
Sodium: 140 mmol/L (ref 135–145)
Total Bilirubin: 0.8 mg/dL (ref 0.3–1.2)
Total Protein: 6.8 g/dL (ref 6.5–8.1)

## 2019-11-16 LAB — CBG MONITORING, ED: Glucose-Capillary: 191 mg/dL — ABNORMAL HIGH (ref 70–99)

## 2019-11-16 LAB — CREATININE, SERUM
Creatinine, Ser: 1.04 mg/dL (ref 0.61–1.24)
GFR calc Af Amer: >60 mL/min (ref >60)
GFR calc non Af Amer: >60 mL/min (ref >60)

## 2019-11-16 LAB — HEMOGLOBIN A1C
Hgb A1c MFr Bld: 5.7 % — ABNORMAL HIGH (ref 4.8–5.6)
Mean Plasma Glucose: 116.89 mg/dL

## 2019-11-16 LAB — LIPID PANEL
Cholesterol: 181 mg/dL (ref 0–200)
HDL: 40 mg/dL — ABNORMAL LOW (ref >40)
LDL Cholesterol: 134 mg/dL — ABNORMAL HIGH (ref 0–99)
Total CHOL/HDL Ratio: 4.5 RATIO
Triglycerides: 34 mg/dL (ref <150)
VLDL: 7 mg/dL (ref 0–40)

## 2019-11-16 LAB — ETHANOL: Alcohol, Ethyl (B): <10 mg/dL (ref <10)

## 2019-11-16 LAB — TROPONIN I (HIGH SENSITIVITY)
Troponin I (High Sensitivity): <2 ng/L (ref <18)
Troponin I (High Sensitivity): 5 ng/L (ref <18)

## 2019-11-16 LAB — LACTIC ACID, PLASMA
Lactic Acid, Venous: 5.2 mmol/L (ref 0.5–1.9)
Lactic Acid, Venous: 2 mmol/L (ref 0.5–1.9)

## 2019-11-16 LAB — HIV ANTIBODY (ROUTINE TESTING W REFLEX): HIV Screen 4th Generation wRfx: NONREACTIVE

## 2019-11-16 LAB — SARS CORONAVIRUS 2 BY RT PCR (HOSPITAL ORDER, PERFORMED IN ~~LOC~~ HOSPITAL LAB): SARS Coronavirus 2: NEGATIVE

## 2019-11-16 LAB — PHOSPHORUS: Phosphorus: 2 mg/dL — ABNORMAL LOW (ref 2.5–4.6)

## 2019-11-16 LAB — TSH: TSH: 1.525 u[IU]/mL (ref 0.350–4.500)

## 2019-11-16 LAB — MAGNESIUM: Magnesium: 1.7 mg/dL (ref 1.7–2.4)

## 2019-11-16 LAB — ANTITHROMBIN III: AntiThromb III Func: 103 % (ref 75–120)

## 2019-11-16 IMAGING — MR MR HEAD W/O CM
12 of 13 series · 44 of 48 positions shown · non-contrast
Comparison: No pertinent prior studies available for comparison.
COMPARISON: No pertinent prior studies available for comparison.

Addendum:
CLINICAL DATA: Persistent vertigo; history of TIA, lupus; vertigo,
central.

EXAM:
MRI HEAD WITHOUT CONTRAST
TECHNIQUE: Multiplanar, multiecho pulse sequences of the brain and surrounding
structures were obtained without intravenous contrast.

[Series 5: DWI · axial · 3.0mm · 0.92mm/px · z∈[-89,+69]mm · 8 of 112 slices shown (1 of 4)]
[im 1/112]
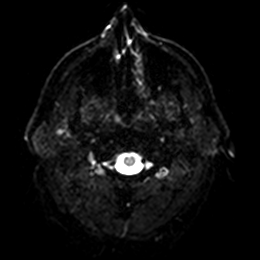
[im 16/112]
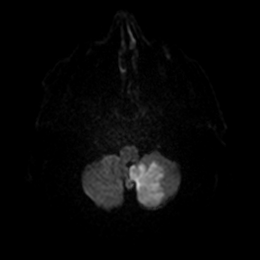
[im 32/112]
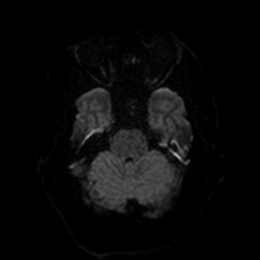
[im 48/112]
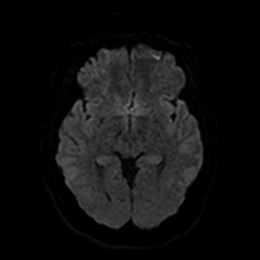
[im 64/112]
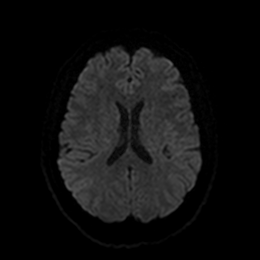
[im 80/112]
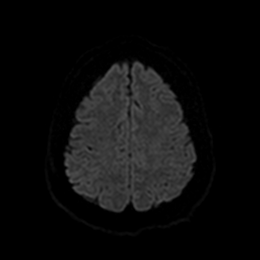
[im 96/112]
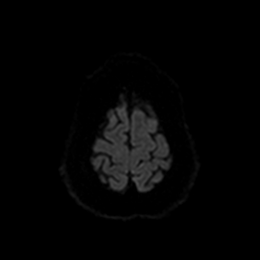
[im 112/112]
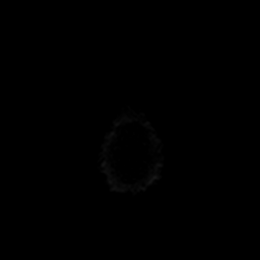

[Series 6: DWI · axial · 3.0mm · 0.92mm/px · z∈[-89,+69]mm · 4 of 55 slices shown (2 of 4)]
[im 1/55]
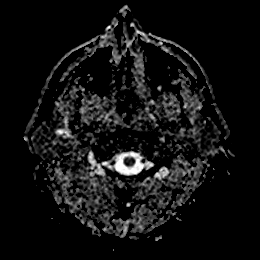
[im 19/55]
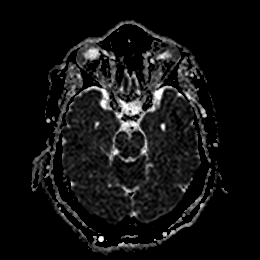
[im 37/55]
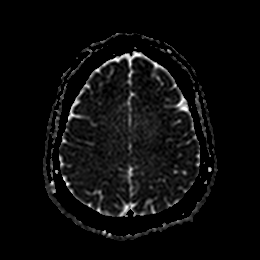
[im 55/55]
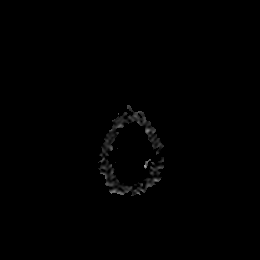

[Series 7: DWI · coronal · 4.0mm · 0.88mm/px · 6 of 80 slices shown (3 of 4)]
[im 1/80]
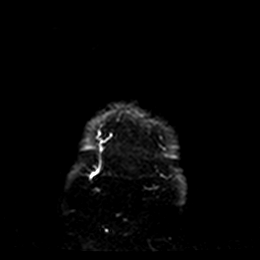
[im 16/80]
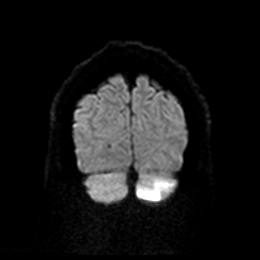
[im 32/80]
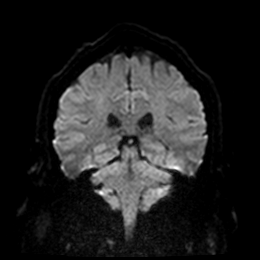
[im 48/80]
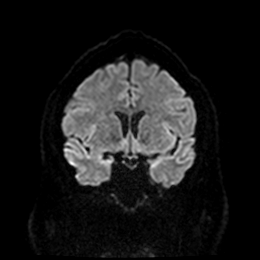
[im 64/80]
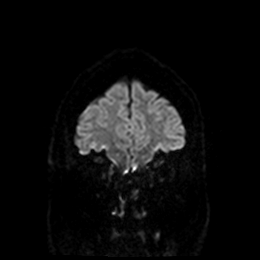
[im 80/80]
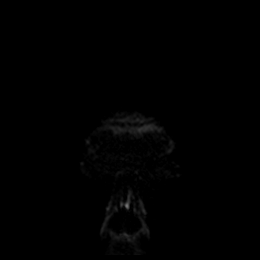

[Series 8: DWI · coronal · 4.0mm · 0.88mm/px · 3 of 40 slices shown (4 of 4)]
[im 1/40]
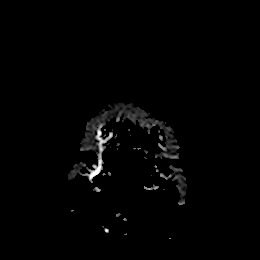
[im 20/40]
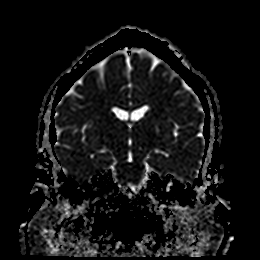
[im 40/40]
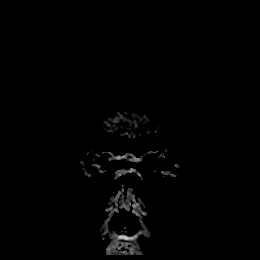

[Series 9: T1 · sagittal · 5.0mm · 0.98mm/px · 2 of 26 slices shown]
[im 1/26]
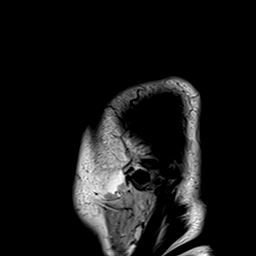
[im 26/26]
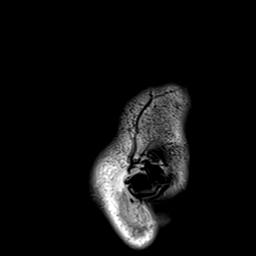

[Series 10: T2 · axial · 5.0mm · 0.94mm/px · z∈[-90,+71]mm · 2 of 29 slices shown (1 of 2)]
[im 1/29]
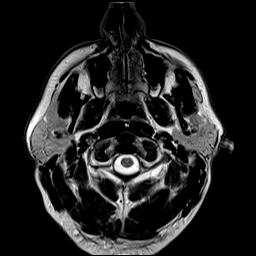
[im 29/29]
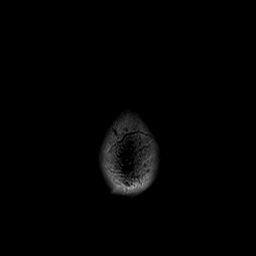

[Series 11: FLAIR · axial · 5.0mm · 0.47mm/px · z∈[-88,+73]mm · 2 of 29 slices shown]
[im 1/29]
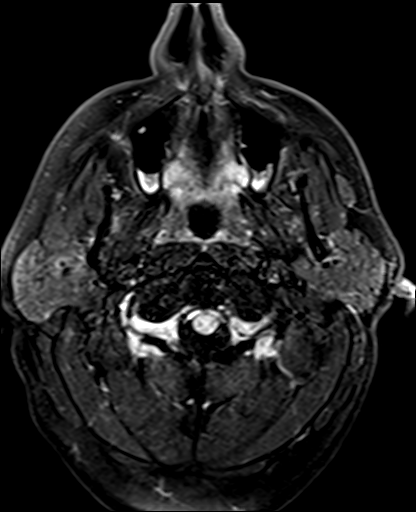
[im 29/29]
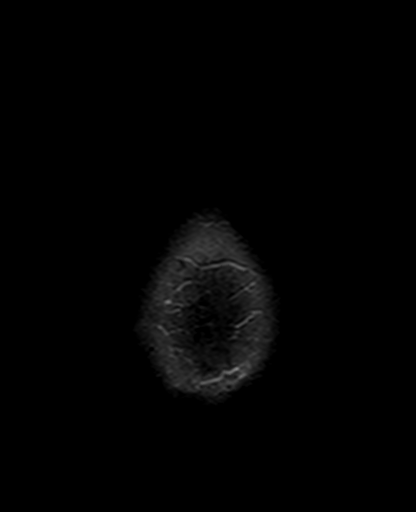

[Series 12: mag_images · axial · 3.0mm · 0.94mm/px · z∈[-85,+73]mm · 4 of 56 slices shown]
[im 1/56]
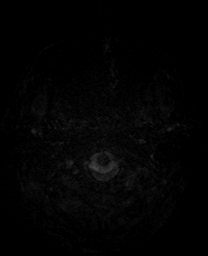
[im 19/56]
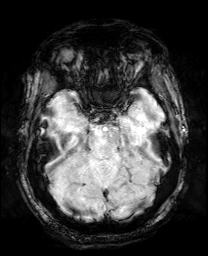
[im 37/56]
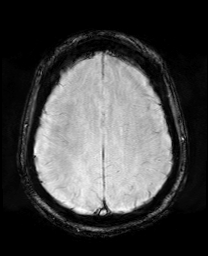
[im 56/56]
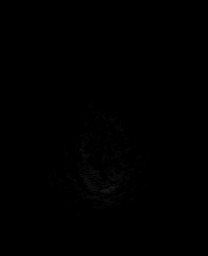

[Series 13: pha_images · axial · 3.0mm · 0.94mm/px · z∈[-85,+73]mm · 4 of 56 slices shown]
[im 1/56]
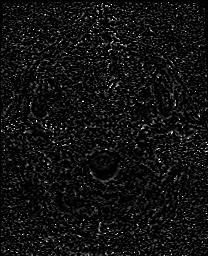
[im 19/56]
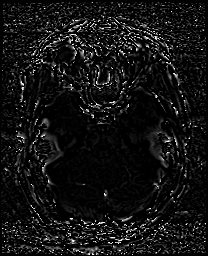
[im 37/56]
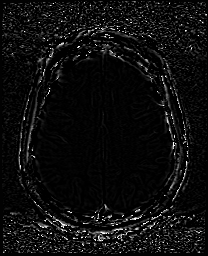
[im 56/56]
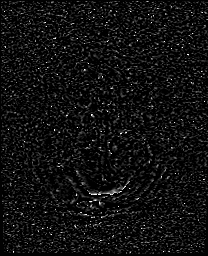

[Series 14: swi_images · axial · 3.0mm · 0.94mm/px · z∈[-85,+73]mm · 4 of 56 slices shown]
[im 1/56]
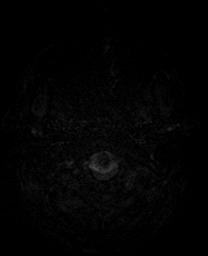
[im 19/56]
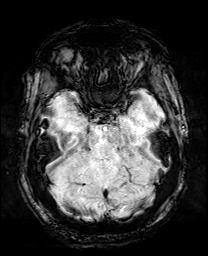
[im 37/56]
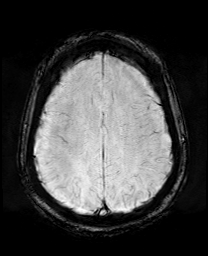
[im 56/56]
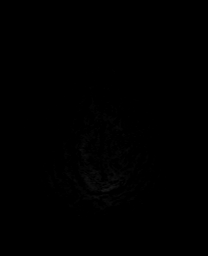

[Series 15: mip_images(sw) · axial · 24.0mm · 0.94mm/px · z∈[-75,+63]mm · 3 of 49 slices shown]
[im 1/49]
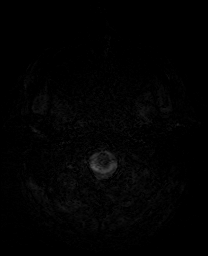
[im 25/49]
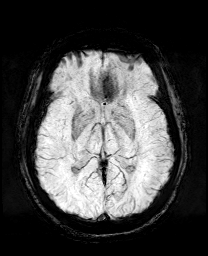
[im 49/49]
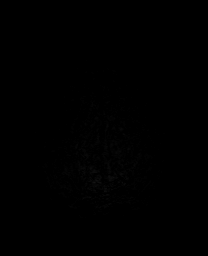

[Series 17: T2 · coronal · 5.0mm · 0.43mm/px · 2 of 34 slices shown (2 of 2)]
[im 1/34]
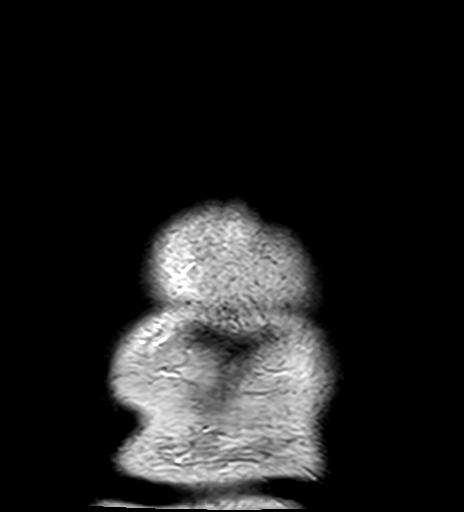
[im 34/34]
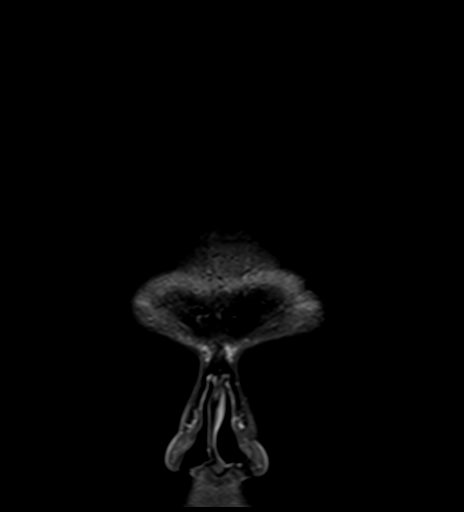

[44 of 48 positions shown; findings below may reference images not displayed]

FINDINGS: Brain:

There is a focus of restricted diffusion within the left cerebellar
hemisphere measuring 4.1 x 3.3 cm in transaxial dimensions
consistent with acute left PICA vascular territory infarct.
Corresponding T2/FLAIR hyperintensity at this site.

An additional punctate acute infarct versus artifact is questioned
within the left occipital lobe (series 5, image 75).

There is no significant white matter disease.

No evidence of intracranial mass.

No chronic intracranial blood products.

No extra-axial fluid collection.

No midline shift.

Vascular: Expected proximal arterial flow voids. However, the left
PICA is poorly assessed due to small vessel size.

Skull and upper cervical spine: No focal marrow lesion.

Sinuses/Orbits: Visualized orbits show no acute finding. Mild
ethmoid sinus mucosal thickening. Small bilateral maxillary sinus
mucous retention cysts. No significant mastoid effusion
IMPRESSION: 4.1 cm acute left PICA territory ischemic infarct within the left
cerebellar hemisphere. There is no significant posterior fossa mass
effect at this time.

An additional punctate acute infarct is questioned within the left
occipital lobe.

Otherwise unremarkable MRI appearance of the brain.

Mild ethmoid sinus mucosal thickening. Small bilateral maxillary
sinus mucous retention cysts.

ADDENDUM:
These results were called by telephone at the time of interpretation
on [DATE] at [DATE] to provider Dr. RASULO, who verbally
acknowledged these results.

*** End of Addendum ***
FINDINGS: Brain:

There is a focus of restricted diffusion within the left cerebellar
hemisphere measuring 4.1 x 3.3 cm in transaxial dimensions
consistent with acute left PICA vascular territory infarct.
Corresponding T2/FLAIR hyperintensity at this site.

An additional punctate acute infarct versus artifact is questioned
within the left occipital lobe (series 5, image 75).

There is no significant white matter disease.

No evidence of intracranial mass.

No chronic intracranial blood products.

No extra-axial fluid collection.

No midline shift.

Vascular: Expected proximal arterial flow voids. However, the left
PICA is poorly assessed due to small vessel size.

Skull and upper cervical spine: No focal marrow lesion.

Sinuses/Orbits: Visualized orbits show no acute finding. Mild
ethmoid sinus mucosal thickening. Small bilateral maxillary sinus
mucous retention cysts. No significant mastoid effusion
IMPRESSION: 4.1 cm acute left PICA territory ischemic infarct within the left
cerebellar hemisphere. There is no significant posterior fossa mass
effect at this time.

An additional punctate acute infarct is questioned within the left
occipital lobe.

Otherwise unremarkable MRI appearance of the brain.

Mild ethmoid sinus mucosal thickening. Small bilateral maxillary
sinus mucous retention cysts.

## 2019-11-16 IMAGING — CT CT ANGIO HEAD
3 of 11 series · 14 of 47 positions shown · IV contrast (APPLIED)
Comparison: Correlation made with MRI performed earlier same day

CLINICAL DATA: Cerebellar stroke

EXAM:
CT ANGIOGRAPHY HEAD AND NECK
TECHNIQUE: Multidetector CT imaging of the head and neck was performed using
the standard protocol during bolus administration of intravenous
contrast. Multiplanar CT image reconstructions and MIPs were
obtained to evaluate the vascular anatomy. Carotid stenosis
measurements (when applicable) are obtained utilizing NASCET
criteria, using the distal internal carotid diameter as the
denominator.
CONTRAST:  100mL OMNIPAQUE IOHEXOL 350 MG/ML SOLN

[Series 13: ax thins · axial · 0.45mm/px · z∈[+1113,+1451]mm · 8 of 396 slices shown]
[im 29/396  brain]
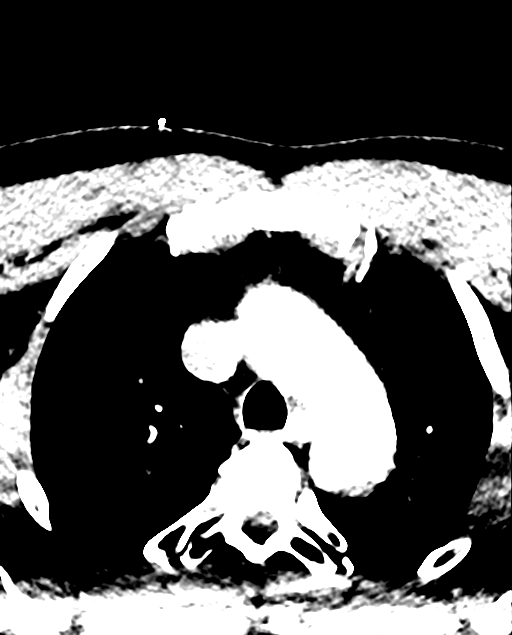
[im 85/396  bone]
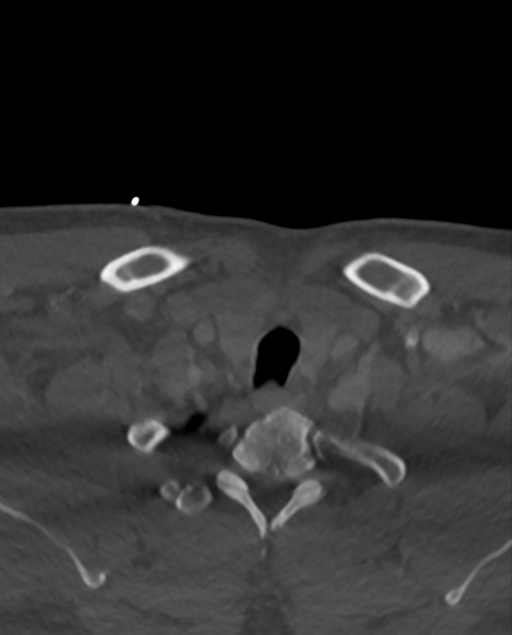
[im 142/396  brain]
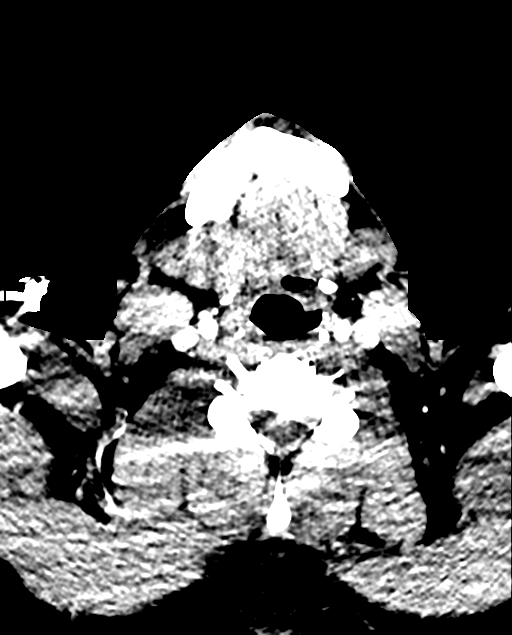
[im 170/396  bone]
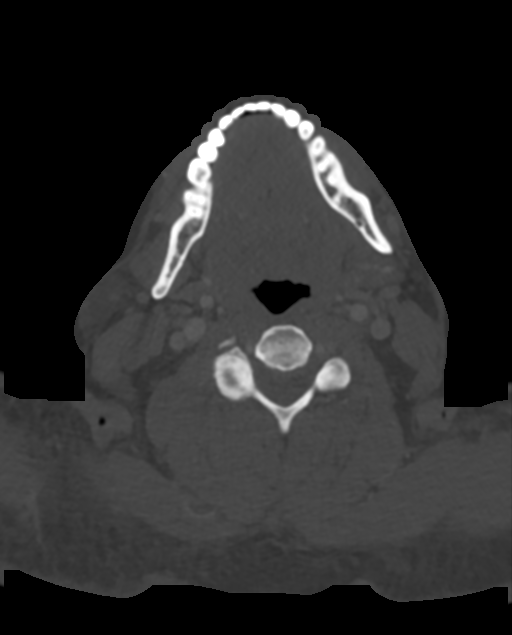
[im 226/396  brain]
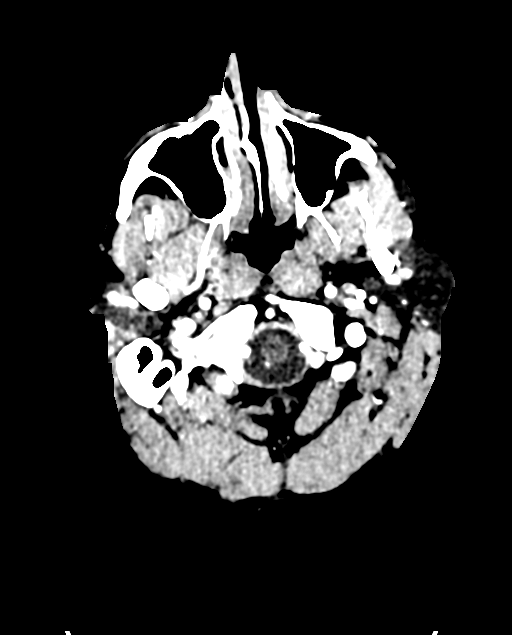
[im 254/396  bone]
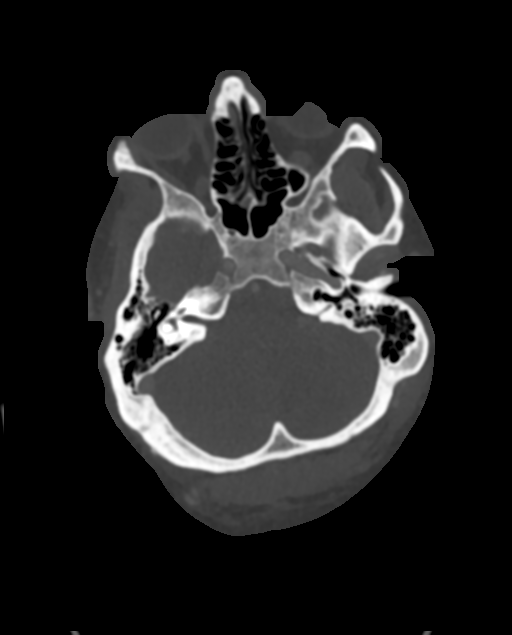
[im 311/396  brain]
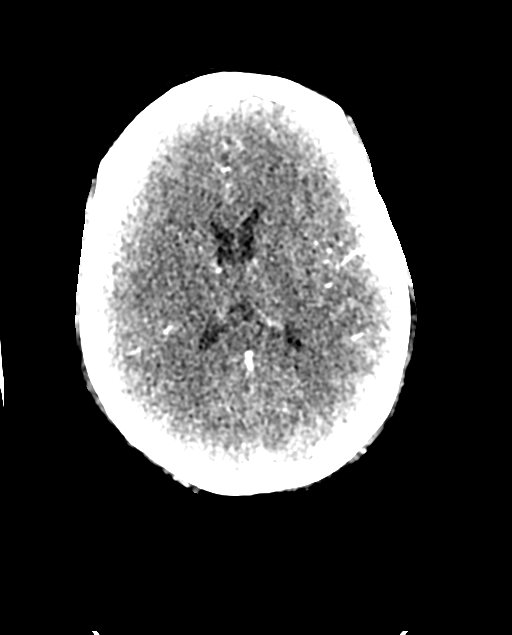
[im 367/396  bone]
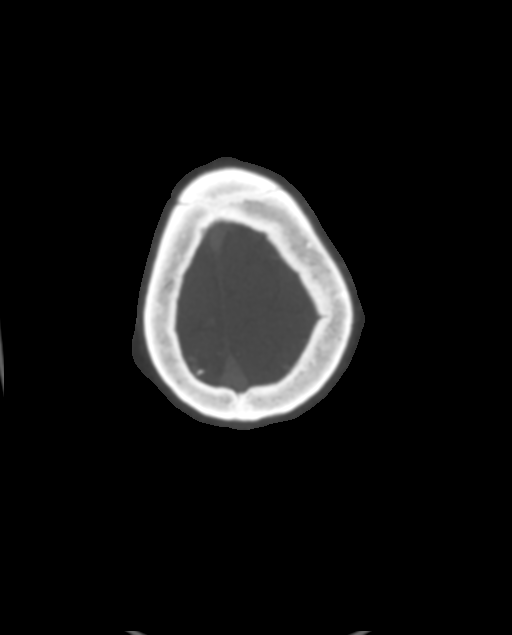

[Series 14: cor thins · coronal · 0.52mm/px · 3 of 281 slices shown]
[im 94/281  brain]
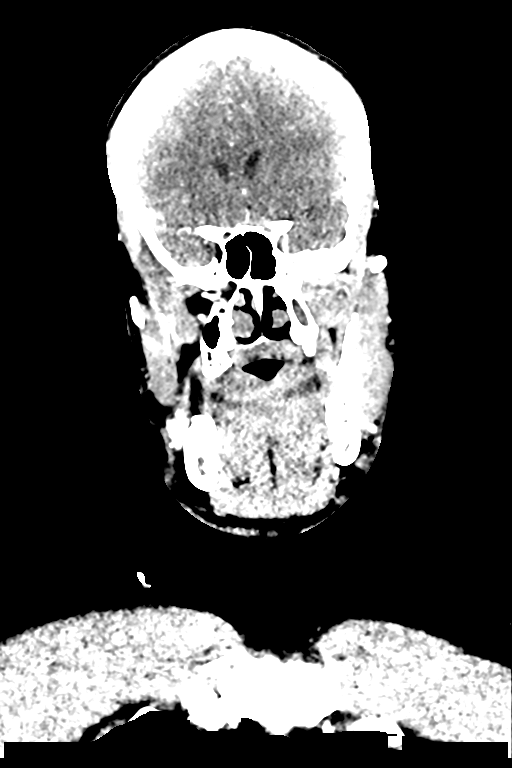
[im 141/281  brain]
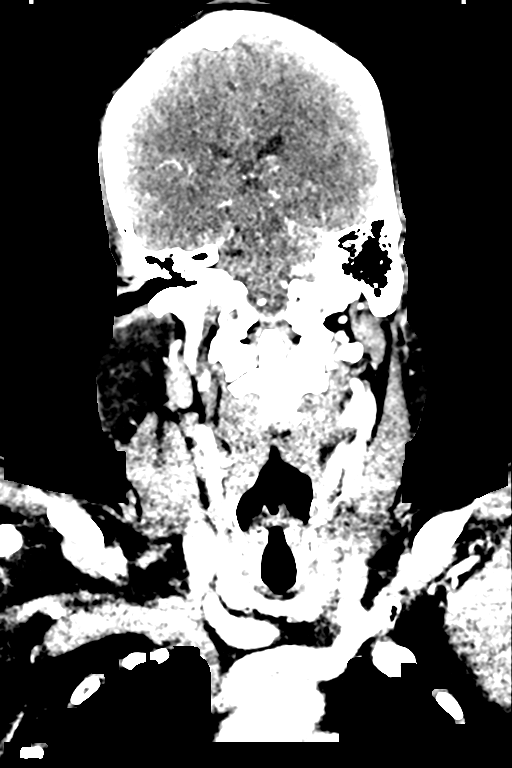
[im 187/281  brain]
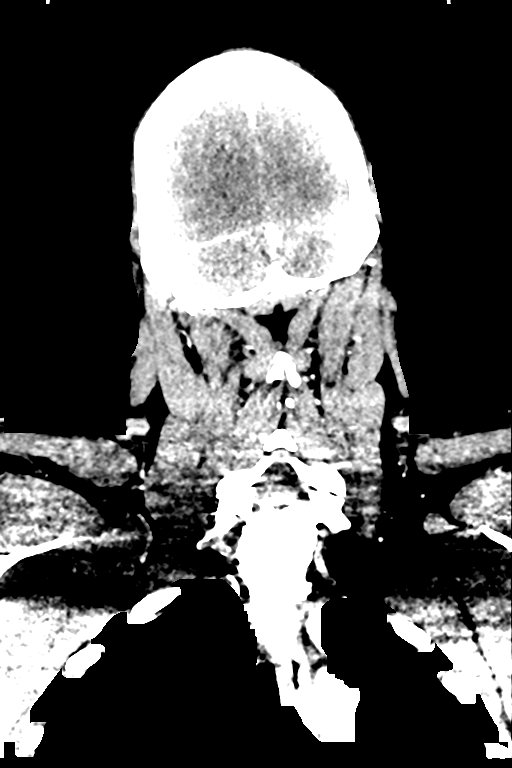

[Series 15: sag thins · sagittal · 0.57mm/px · 3 of 220 slices shown]
[im 55/220  brain]
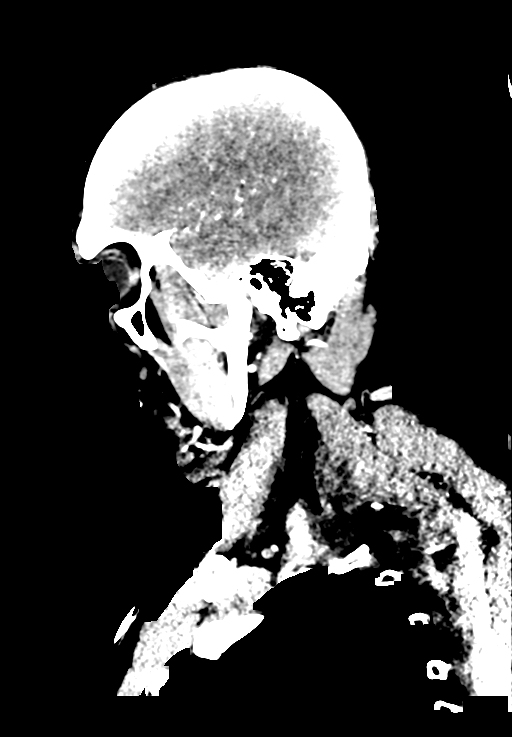
[im 110/220  brain]
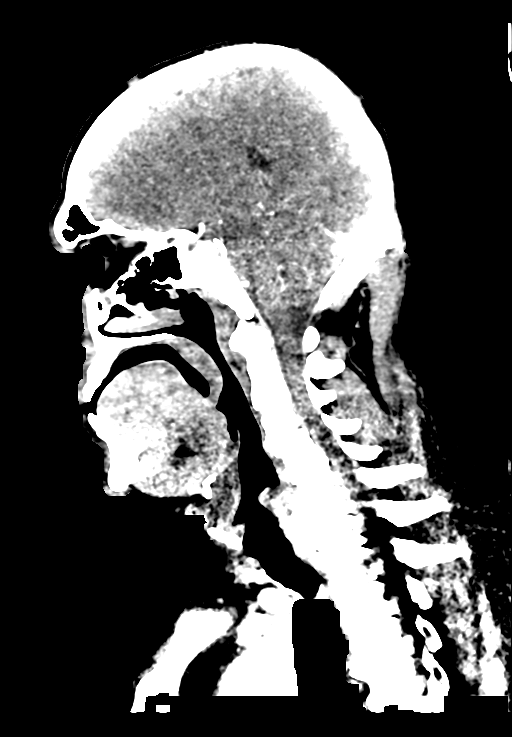
[im 165/220  brain]
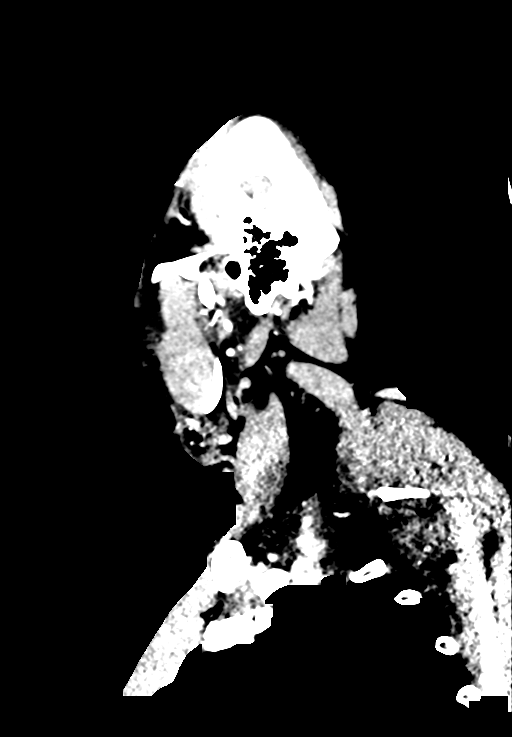

[14 of 47 positions shown; findings below may reference images not displayed]

FINDINGS: CT HEAD

Brain: There is hypodensity in the left cerebellum inferiorly
reflecting infarction seen on prior MRI. There is no evidence of
hemorrhagic transformation. No hydrocephalus. There is no
extra-axial fluid collection. Ventricles and sulci are within normal
limits in size and configuration.

Vascular: No hyperdense vessel or unexpected calcification.

Skull: Calvarium is unremarkable.

Sinuses/Orbits: No acute finding.

Other: None.

Review of the MIP images confirms the above findings

CTA NECK

Aortic arch: Great vessel origins are patent.

Right carotid system: Patent. No measurable stenosis at the ICA
origin

Left carotid system: Patent. Trace calcified plaque at the ICA
origin without measurable stenosis.

Vertebral arteries: Patent and codominant. No measurable stenosis or
evidence of dissection.

Skeleton: Degenerative changes of the cervical spine, greatest at
C5-C6 to C7-T1.

Other neck: No mass or adenopathy.

Upper chest: No apical lung mass.

Review of the MIP images confirms the above findings

CTA HEAD

Anterior circulation: Intracranial internal carotid arteries are
patent. Anterior and middle cerebral arteries are patent.

Posterior circulation: Intracranial vertebral arteries patent.
Patent bilateral PICA origins. Basilar artery is patent. A small
right AICA is noted. Superior cerebellar artery origins are patent.
Patent posterior cerebral arteries.

Venous sinuses: Patent as allowed by contrast bolus timing.

Review of the MIP images confirms the above findings
IMPRESSION: Evolving acute left cerebellar PICA territory infarction. No
hydrocephalus or hemorrhage.

No large vessel occlusion or hemodynamically significant stenosis.
Left PICA origin is patent

## 2019-11-16 IMAGING — DX DG CHEST 1V PORT
1 series · 1 of 1 positions shown · non-contrast
Comparison: [DATE].

CLINICAL DATA: Chest pain.

EXAM:
PORTABLE CHEST 1 VIEW

[chest]
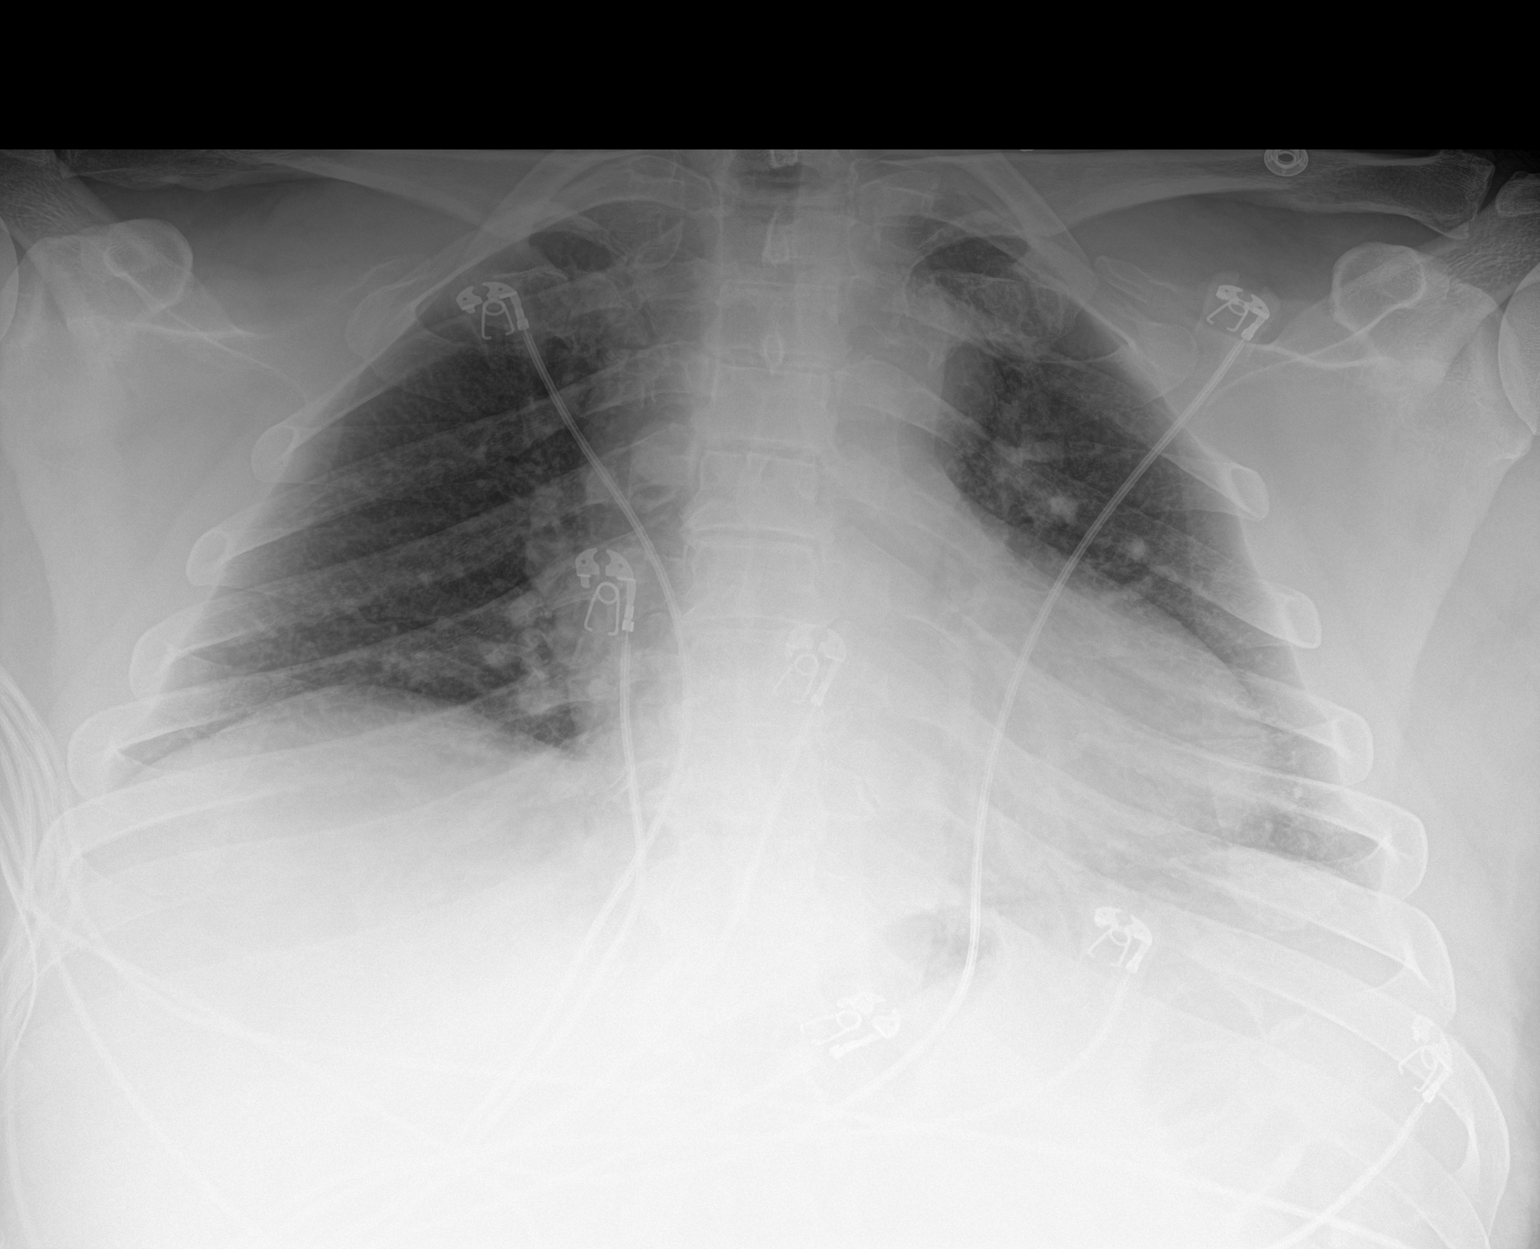

[1 of 1 positions shown; findings below may reference images not displayed]

FINDINGS: Mild prominence of the upper mediastinum noted. This may be
secondary to AP apical lordotic portable technique. Upright PA and
lateral chest x-ray suggested to exclude mediastinal widening.
Borderline cardiomegaly also possibly related to AP technique. Low
lung volumes with basilar atelectasis. No pleural effusion or
pneumothorax. No displaced rib fracture identified.
IMPRESSION: 1. Mild prominence of the upper mediastinum noted. This may be
secondary to AP apical lordotic portable technique. Upright PA and
lateral chest x-ray suggested to exclude mediastinal widening.
Borderline cardiomegaly could also possibly related to AP technique.

2.  Low lung volumes with bibasilar atelectasis.

This report was phoned to PADAM PA in the emergency room at
[DATE] a.m. on [DATE].

## 2019-11-16 IMAGING — CR DG CHEST 2V
3 series · 3 of 3 positions shown · non-contrast
Comparison: Earlier portable chest x-ray, same date.

CLINICAL DATA: Dizziness and lethargy.

EXAM:
CHEST - 2 VIEW

[chest lat]
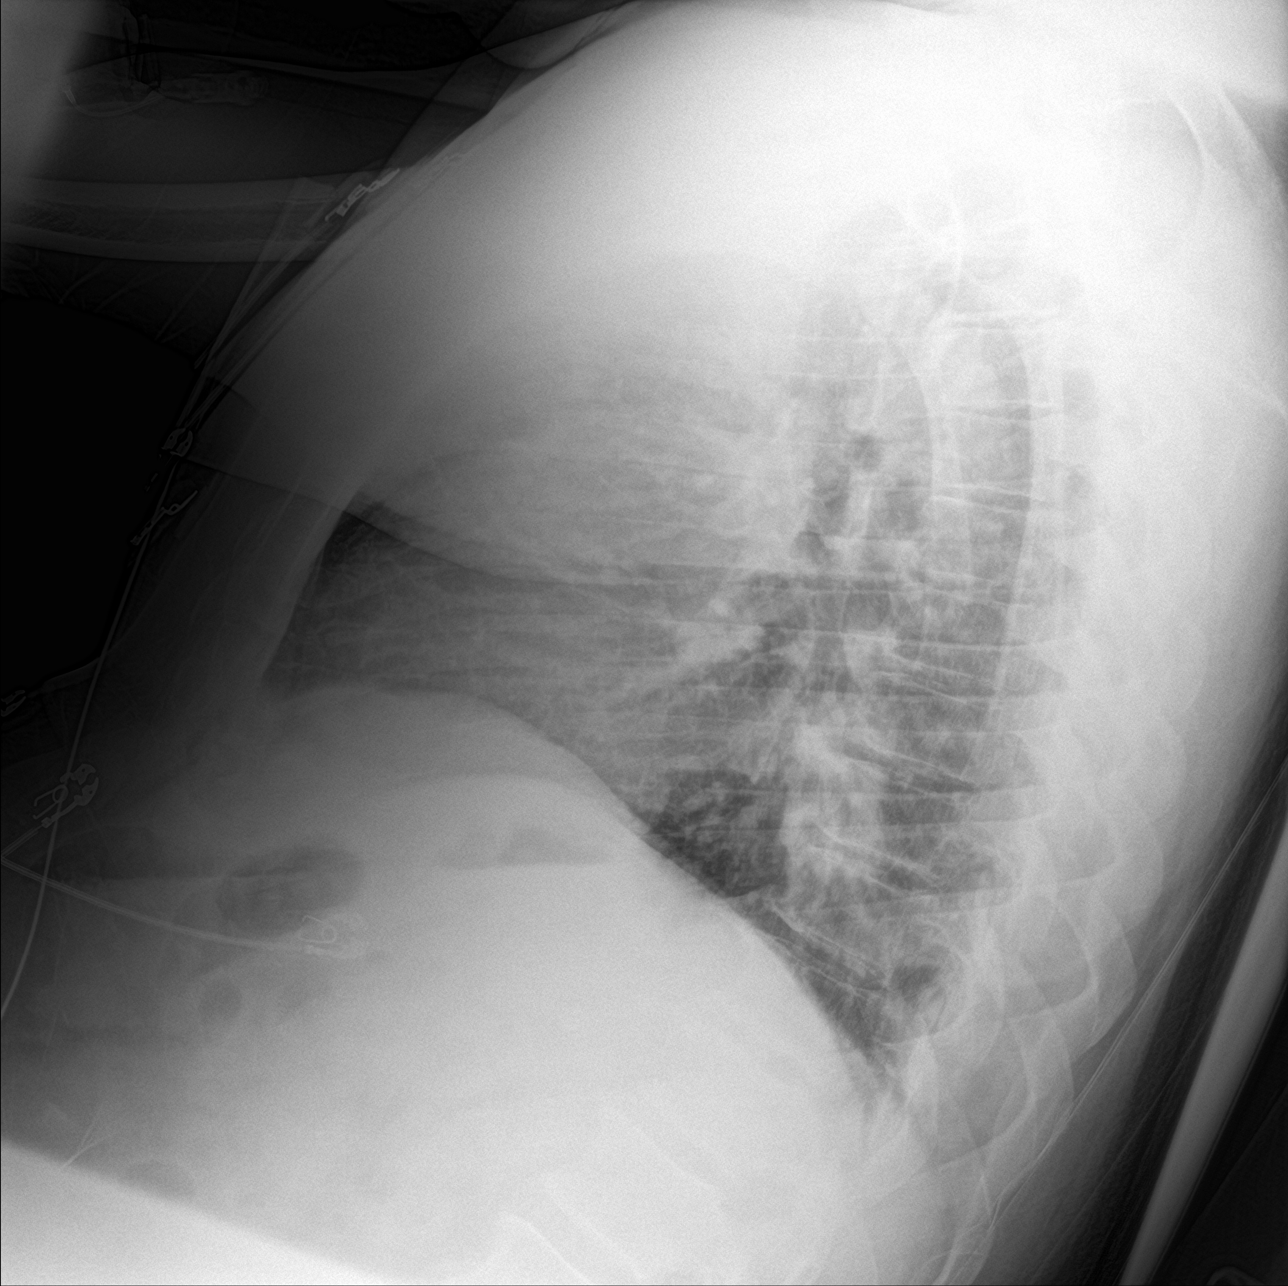

[chest ap (1 of 2)]
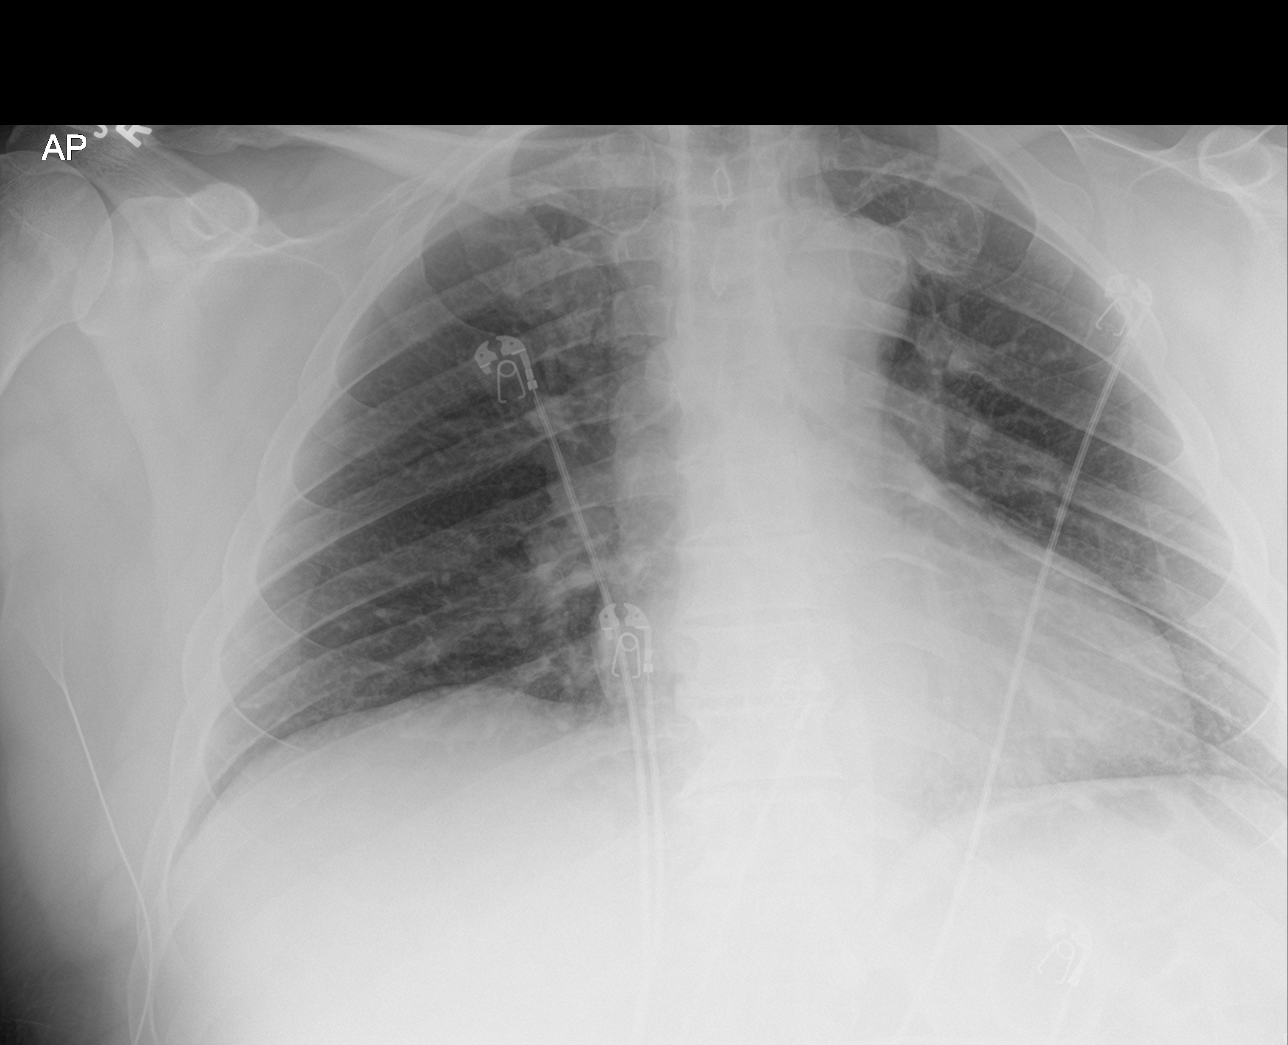

[chest ap (2 of 2)]
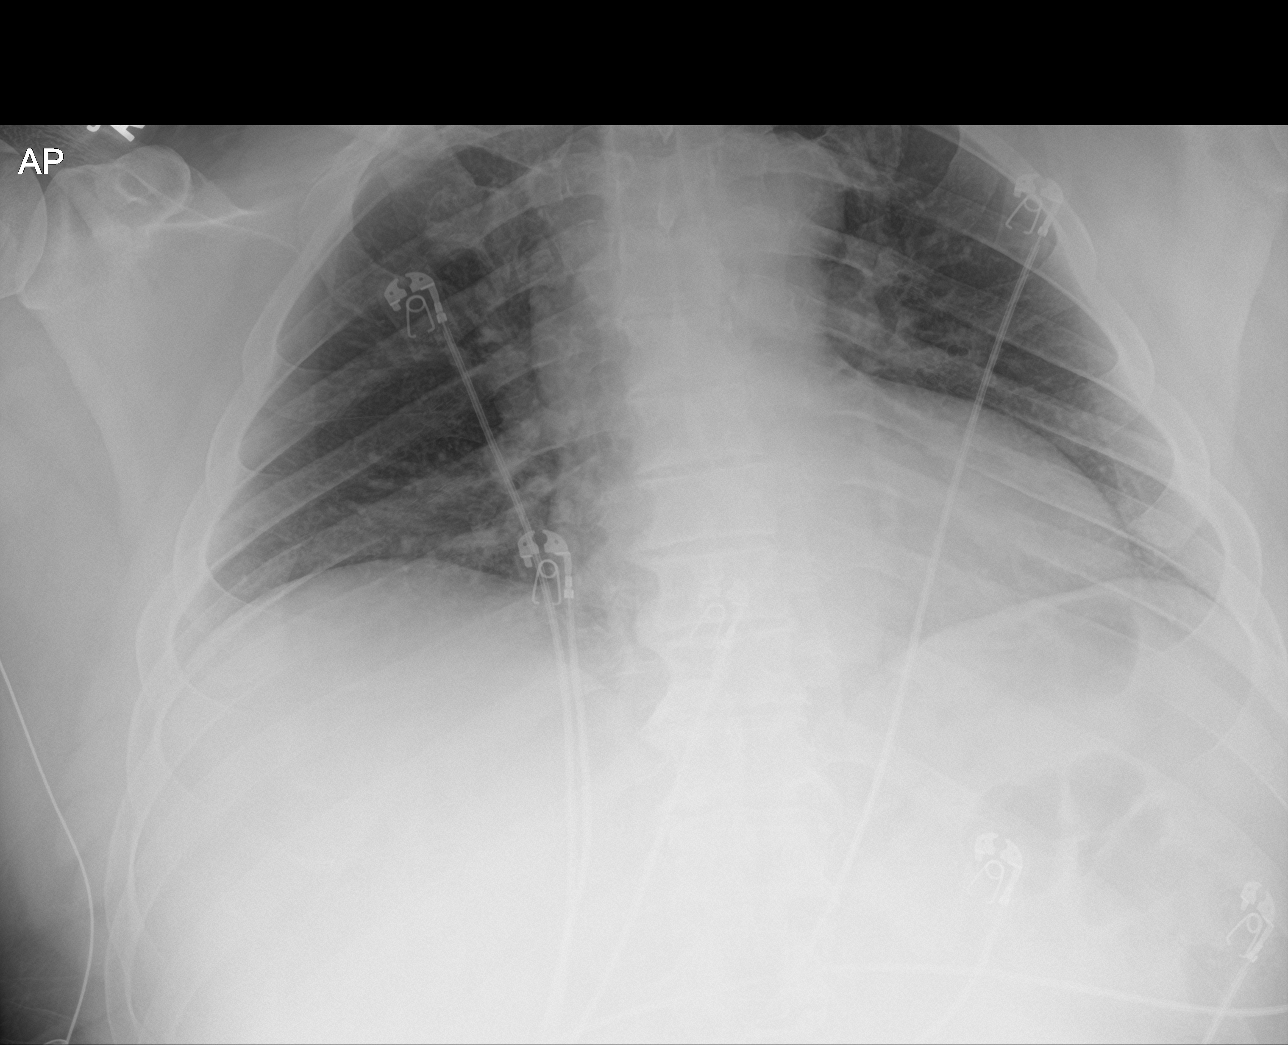

[3 of 3 positions shown; findings below may reference images not displayed]

FINDINGS: Borderline cardiac enlargement with left ventricular configuration.
Mild tortuosity of the thoracic aorta.

The mediastinal and hilar contours are unremarkable.

The lungs are clear. No pleural effusion.

The bony thorax is intact.
IMPRESSION: No acute cardiopulmonary findings. The slightly prominent
mediastinal contour appear to be due to mild tortuosity of the
thoracic aorta.

## 2019-11-16 MED ORDER — KETOROLAC TROMETHAMINE 30 MG/ML IJ SOLN
30.0000 mg | Freq: Four times a day (QID) | INTRAMUSCULAR | Status: DC | PRN
  Administered 2019-11-16 – 2019-11-17 (×3): 30 mg via INTRAVENOUS
  Filled 2019-11-16 (×4): qty 1

## 2019-11-16 MED ORDER — MELATONIN 3 MG PO TABS
3.0000 mg | ORAL_TABLET | Freq: Once | ORAL | Status: AC
  Administered 2019-11-16: 3 mg via ORAL
  Filled 2019-11-16: qty 1

## 2019-11-16 MED ORDER — POTASSIUM CHLORIDE IN NACL 20-0.9 MEQ/L-% IV SOLN
INTRAVENOUS | Status: DC
  Filled 2019-11-16 (×12): qty 1000

## 2019-11-16 MED ORDER — POTASSIUM CHLORIDE CRYS ER 20 MEQ PO TBCR
40.0000 meq | EXTENDED_RELEASE_TABLET | Freq: Once | ORAL | Status: AC
  Administered 2019-11-16: 40 meq via ORAL
  Filled 2019-11-16: qty 2

## 2019-11-16 MED ORDER — ESCITALOPRAM OXALATE 10 MG PO TABS
10.0000 mg | ORAL_TABLET | Freq: Every day | ORAL | Status: DC
  Administered 2019-11-21: 10 mg via ORAL
  Filled 2019-11-16 (×4): qty 1

## 2019-11-16 MED ORDER — ACETAMINOPHEN 650 MG RE SUPP: 650.0000 mg | Freq: Four times a day (QID) | RECTAL | Status: DC | PRN

## 2019-11-16 MED ORDER — SODIUM CHLORIDE 0.9 % IV SOLN: INTRAVENOUS | Status: DC

## 2019-11-16 MED ORDER — DIPHENHYDRAMINE HCL 50 MG/ML IJ SOLN
25.0000 mg | Freq: Once | INTRAMUSCULAR | Status: AC
  Administered 2019-11-16: 25 mg via INTRAVENOUS
  Filled 2019-11-16: qty 1

## 2019-11-16 MED ORDER — ONDANSETRON HCL 4 MG/2ML IJ SOLN
4.0000 mg | Freq: Once | INTRAMUSCULAR | Status: AC
  Administered 2019-11-16: 4 mg via INTRAVENOUS
  Filled 2019-11-16: qty 2

## 2019-11-16 MED ORDER — IOHEXOL 350 MG/ML SOLN
100.0000 mL | Freq: Once | INTRAVENOUS | Status: AC | PRN
  Administered 2019-11-16: 100 mL via INTRAVENOUS

## 2019-11-16 MED ORDER — METOCLOPRAMIDE HCL 5 MG/ML IJ SOLN
10.0000 mg | Freq: Once | INTRAMUSCULAR | Status: AC
  Administered 2019-11-16: 10 mg via INTRAVENOUS
  Filled 2019-11-16: qty 2

## 2019-11-16 MED ORDER — ASPIRIN 325 MG PO TABS
325.0000 mg | ORAL_TABLET | Freq: Every day | ORAL | Status: DC
  Administered 2019-11-16: 325 mg via ORAL
  Filled 2019-11-16: qty 1

## 2019-11-16 MED ORDER — MECLIZINE HCL 12.5 MG PO TABS
25.0000 mg | ORAL_TABLET | Freq: Three times a day (TID) | ORAL | Status: DC | PRN
  Administered 2019-11-16 – 2019-11-19 (×2): 25 mg via ORAL
  Filled 2019-11-16 (×3): qty 1

## 2019-11-16 MED ORDER — LORAZEPAM 2 MG/ML IJ SOLN
1.0000 mg | Freq: Once | INTRAMUSCULAR | Status: AC
  Administered 2019-11-16: 1 mg via INTRAVENOUS
  Filled 2019-11-16: qty 1

## 2019-11-16 MED ORDER — CLONAZEPAM 0.5 MG PO TABS
0.5000 mg | ORAL_TABLET | ORAL | Status: DC | PRN
  Administered 2019-11-17 – 2019-11-21 (×4): 0.5 mg via ORAL
  Filled 2019-11-16 (×5): qty 1

## 2019-11-16 MED ORDER — ATORVASTATIN CALCIUM 80 MG PO TABS
80.0000 mg | ORAL_TABLET | Freq: Every day | ORAL | Status: DC
  Administered 2019-11-18 – 2019-11-22 (×5): 80 mg via ORAL
  Filled 2019-11-16 (×5): qty 1

## 2019-11-16 MED ORDER — LOSARTAN POTASSIUM 50 MG PO TABS: 50.0000 mg | ORAL_TABLET | Freq: Every day | ORAL | Status: DC

## 2019-11-16 MED ORDER — SODIUM CHLORIDE 0.9 % IV BOLUS
1000.0000 mL | Freq: Once | INTRAVENOUS | Status: AC
  Administered 2019-11-16: 1000 mL via INTRAVENOUS

## 2019-11-16 MED ORDER — ACETAMINOPHEN 325 MG PO TABS
650.0000 mg | ORAL_TABLET | Freq: Four times a day (QID) | ORAL | Status: DC | PRN
  Administered 2019-11-16 – 2019-11-21 (×9): 650 mg via ORAL
  Filled 2019-11-16 (×11): qty 2

## 2019-11-16 MED ORDER — ATORVASTATIN CALCIUM 10 MG PO TABS: 10.0000 mg | ORAL_TABLET | Freq: Every day | ORAL | Status: DC

## 2019-11-16 MED ORDER — ONDANSETRON HCL 4 MG/2ML IJ SOLN
4.0000 mg | Freq: Four times a day (QID) | INTRAMUSCULAR | Status: DC | PRN
  Administered 2019-11-17 – 2019-11-18 (×5): 4 mg via INTRAVENOUS
  Filled 2019-11-16 (×5): qty 2

## 2019-11-16 MED ORDER — ONDANSETRON HCL 4 MG PO TABS: 4.0000 mg | ORAL_TABLET | Freq: Four times a day (QID) | ORAL | Status: DC | PRN

## 2019-11-16 MED ORDER — ENOXAPARIN SODIUM 40 MG/0.4ML ~~LOC~~ SOLN
40.0000 mg | SUBCUTANEOUS | Status: DC
  Administered 2019-11-16: 40 mg via SUBCUTANEOUS
  Filled 2019-11-16: qty 0.4

## 2019-11-16 NOTE — ED Notes (Signed)
Called MRI and will get to the patient as soon as possible.

## 2019-11-16 NOTE — ED Notes (Signed)
Pt is in MRI  

## 2019-11-16 NOTE — ED Notes (Signed)
Report given to inpatient RN.

## 2019-11-16 NOTE — ED Triage Notes (Signed)
Pt BIB EMS due to dizziness. Pt was driving this  morning when he felt dizzy , weak. Pt reports yesterday he had some cp but none on arrival. EMS states he was lethargic on arrival.  EMS gave bolus of NS zofran  325 Asprin

## 2019-11-16 NOTE — Consult Note (Signed)
Neurology Consultation  Reason for Consult: Acute cerebellar infarction Referring Physician: Dr. Jacqulyn Bath, [facility]  CC: Dizziness, loss of balance  History is obtained from: Patient, chart  HPI: [name] is a 49 y.o. 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 He reported that he went to bed last night normal, woke up this morning at 530, left the house at 7 AM to take his father to a doctor's appointment when he had a sudden onset of dizziness while he was driving.  He describes as a sudden onset of the whole world around him spinning making it difficult to concentrate on the road and drive.  He immediately pulled over, put his car in park, and his cousin's house was right around the corner, who called an ambulance for him. He was very ataxic while walking.  He also was complaining of chest pain going on for 2 or 3 days.  He was also very ataxic sitting up in bed. He was brought into the emergency room and evaluated for his chest pain and also an MRI was obtained for his complaints of dizziness for the concern of a posterior circulation stroke. 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  He was admitted to the hospital service and a neurological consultation was obtained.   LKW: 7 AM on 11/16/2019 tpa given?: no, neurology notified way past the TPA window.  Code stroke not activated in the ED-examination was normal other than subjective dizziness symptoms as documented in the ED provider note.  NIH stroke scale on arrival 0 per chart review.  Premorbid modified Rankin scale (mRS): 0  ROS: In addition to the neurological complaints listed above, complains of chest pain.  Full review performed and otherwise negative.  Past Medical History:   Diagnosis Date  . Arthritis    neck  . Carpal tunnel syndrome on both sides 08/2014  . DDD (degenerative disc disease), lumbar   . History of kidney stones   . Hypertension    under control with med., has been on med. x 1 yr.  . Sleep apnea    CPAP but does not use   Family history: Family history of father having a stroke in his 85s. Mother died of heart attack in her early 77s.  Social History:   reports that he has never smoked. He has never used smokeless tobacco. He reports current alcohol use. He reports that he does not use drugs. Reports marijuana use Denies cocaine or amphetamine or stimulant use.  Medications  Current Facility-Administered Medications:  .  0.9 % NaCl with KCl 20 mEq/ L  infusion, , Intravenous, Continuous, Pahwani, Rinka R, MD, Last Rate: 75 mL/hr at 11/16/19 1848, New Bag at 11/16/19 1848 .  acetaminophen (TYLENOL) tablet 650 mg, 650 mg, Oral, Q6H PRN, 650 mg at 11/16/19 1838 **OR** acetaminophen (TYLENOL) suppository 650 mg, 650 mg, Rectal, Q6H PRN, Pahwani, Rinka R, MD .  aspirin tablet 325 mg, 325 mg, Oral, Daily, Pahwani, Rinka R, MD, 325 mg at 11/16/19 1708 .  [START ON 11/17/2019] atorvastatin (LIPITOR) tablet 80 mg, 80 mg, Oral, Daily, Pahwani, Rinka R, MD .  clonazePAM (KLONOPIN) tablet 0.5 mg, 0.5 mg, Oral, PRN, Pahwani, Rinka R, MD .  enoxaparin (LOVENOX) injection 40 mg, 40 mg, Subcutaneous,  Q24H, Pahwani, Rinka R, MD, 40 mg at 11/16/19 1708 .  escitalopram (LEXAPRO) tablet 10 mg, 10 mg, Oral, Daily, Pahwani, Rinka R, MD .  meclizine (ANTIVERT) tablet 25 mg, 25 mg, Oral, TID PRN, Pahwani, Rinka R, MD, 25 mg at 11/16/19 1838 .  ondansetron (ZOFRAN) tablet 4 mg, 4 mg, Oral, Q6H PRN **OR** ondansetron (ZOFRAN) injection 4 mg, 4 mg, Intravenous, Q6H PRN, Pahwani, Rinka R, MD  Current Outpatient Medications:  .  atorvastatin (LIPITOR) 10 MG tablet, Take 10 mg by mouth daily., Disp: , Rfl:  .  clonazePAM (KLONOPIN) 0.5 MG tablet, Take 0.5 mg by mouth  as needed (sleep). , Disp: , Rfl:  .  escitalopram (LEXAPRO) 10 MG tablet, Take 10 mg by mouth daily., Disp: , Rfl:  .  losartan (COZAAR) 50 MG tablet, Take 50 mg by mouth daily., Disp: , Rfl:  .  HYDROcodone-acetaminophen (NORCO/VICODIN) 5-325 MG tablet, Take 1 tablet by mouth every 4 (four) hours as needed. (Patient not taking: Reported on 07/31/2019), Disp: 12 tablet, Rfl: 0 .  ibuprofen (ADVIL,MOTRIN) 800 MG tablet, Take 1 tablet (800 mg total) by mouth every 8 (eight) hours as needed for moderate pain. (Patient not taking: Reported on 11/16/2019), Disp: 15 tablet, Rfl: 0 .  ondansetron (ZOFRAN ODT) 4 MG disintegrating tablet, Take 1 tablet (4 mg total) by mouth every 8 (eight) hours as needed for nausea. (Patient not taking: Reported on 07/31/2019), Disp: 10 tablet, Rfl: 0 .  oxyCODONE-acetaminophen (PERCOCET) 5-325 MG tablet, Take 1 tablet by mouth every 4 (four) hours as needed for moderate pain. (Patient not taking: Reported on 07/31/2019), Disp: 20 tablet, Rfl: 0  Exam: Current vital signs: BP 116/66   Pulse (!) 101   Temp 98.3 F (36.8 C) (Oral)   Resp (!) 7   SpO2 97%  Vital signs in last 24 hours: Temp:  [93.8 F (34.3 C)-98.3 F (36.8 C)] 98.3 F (36.8 C) (07/01 1913) Pulse Rate:  [68-105] 101 (07/01 1900) Resp:  [0-27] 7 (07/01 1900) BP: (105-156)/(61-97) 116/66 (07/01 1900) SpO2:  [90 %-100 %] 97 % (07/01 1900) General: Awake alert in some distress due to dizziness and nausea. HEENT: Normal cephalic atraumatic Lungs clear Cardiovascular: Regular rate rhythm, no murmur rub gallop auscultated Abdomen soft nondistended nontender obese Extremities warm well perfused with no edema Neurological exam Awake alert oriented x3 Speech is nondysarthric Naming comprehension repetition intact Cranial nerves: Pupils equal round reactive light, extraocular movements intact with no evidence of nystagmus and no diplopia, visual fields full, facial sensation intact, face symmetric, auditory  acuity intact, tongue and palate midline, shoulder shrug intact, tongue strength normal. Motor exam: 5/5 in all 4 extremities with no drift. Sensory exam: Intact to light touch all over Cerebellar exam: No finger-nose-finger ataxia noted.  No ataxia in the lower extremity. Reports ataxia while walking-gait testing was deferred for discomfort and safety at this time. NIH stroke scale is 0  Labs I have reviewed labs in epic and the results pertinent to this consultation are:   CBC    Component Value Date/Time   WBC 8.6 11/16/2019 1623   RBC 4.97 11/16/2019 1623   HGB 15.7 11/16/2019 1623   HCT 47.7 11/16/2019 1623   PLT 201 11/16/2019 1623   MCV 96.0 11/16/2019 1623   MCH 31.6 11/16/2019 1623   MCHC 32.9 11/16/2019 1623   RDW 12.3 11/16/2019 1623   LYMPHSABS 1.5 11/16/2019 0834   MONOABS 0.5 11/16/2019 0834   EOSABS 0.1 11/16/2019 0834  BASOSABS 0.0 11/16/2019 0834    CMP     Component Value Date/Time   NA 140 11/16/2019 0834   K 3.3 (L) 11/16/2019 0834   CL 107 11/16/2019 0834   CO2 20 (L) 11/16/2019 0834   GLUCOSE 189 (H) 11/16/2019 0834   BUN 11 11/16/2019 0834   CREATININE 1.04 11/16/2019 1623   CALCIUM 8.7 (L) 11/16/2019 0834   PROT 6.8 11/16/2019 0834   ALBUMIN 4.0 11/16/2019 0834   AST 34 11/16/2019 0834   ALT 48 (H) 11/16/2019 0834   ALKPHOS 68 11/16/2019 0834   BILITOT 0.8 11/16/2019 0834   GFRNONAA >60 11/16/2019 1623   GFRAA >60 11/16/2019 1623    Lipid Panel     Component Value Date/Time   CHOL 181 11/16/2019 1623   TRIG 34 11/16/2019 1623   HDL 40 (L) 11/16/2019 1623   CHOLHDL 4.5 11/16/2019 1623   VLDL 7 11/16/2019 1623   LDLCALC 134 (H) 11/16/2019 1623     Imaging I have reviewed the images obtained:  MRI brain with a 4.1 cm acute left PICA infarct.  No significant posterior fossa mass-effect at this time.  An additional punctate acute infarct question within the left occipital lobe.  Assessment:  49 year old with above past medical  history presenting for evaluation of sudden onset of dizziness/room spinning as well as nausea vomiting and imbalance while walking. MRI done in the emergency room shows a left PICA territory infarct. Initial complaints also include chest pain for which he is being worked up by medicine. Code stroke was not activated-exam was normal on arrival with only subjective dizziness and ataxia while walking symptoms. By the time neurological consultation was obtained, which was after the MRI was obtained, he was outside the window for IV TPA. Due to the establish stroke on the MRI, at this point he is not a candidate for an acute endovascular intervention. He needs a full stroke work-up including hypercoagulable work-up-has a family history of heart attack and mother in her 75s, to which she succumbed.  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  Atheroembolic versus cardioembolic.  Recommendations: I would recommend admission to a stepdown unit for close monitoring.  There is no evidence of mass-effect in the posterior fossa yet but he is only day one of his stroke and the swelling might still continue to peak for the next few days. I do not see any need for starting him on hypertonic/hyperosmolar therapy for right now. Symptomatic nausea and headache management-can use Zofran.  I would also use something stronger than Tylenol.  Will discuss with the hospitalist. Frequent neurochecks A1c LDL 2D echo-might need a transesophageal echocardiogram if all other tests are negative and the source is not identified. Might need a loop recorder for outpatient long-term cardiac monitoring. Aspirin 325 for now High-dose statin PT OT speech therapy Bedside swallow evaluation.  N.p.o. until cleared by bedside swallow formal swallow evaluation Hypercoagulable work-up. CT angiogram head and neck.  Discussed with the wife that showed her the imaging.  Discussed with the  patient. Explained to them that we will be doing a lot of work-up to figure out the etiology of the stroke which might include a TEE.  Although at this time, he does not have any mass-effect in the posterior fossa, I did warn them of a remote possibility that he might require suboccipital decompression should he start to show any signs of hydrocephalus and posterior fossa mass-effect.  I have communicated my plan  to the primary hospitalist via epic secure chat.  Please feel free to call back with any questions at this time.  Stroke team will follow on rounds in the morning.  -- Milon DikesAshish Tadd Holtmeyer, MD [facility] Pager: [phone] If 7pm to 7am, please call on call as listed on AMION.

## 2019-11-16 NOTE — H&P (Signed)
History and Physical    [name] UXN:[id number] DOB: 1970/11/22 DOA: 11/16/2019  PCP: Sigmund Hazel, MD  Patient coming from: Home I have personally briefly reviewed patient's old medical records in [facility]  Chief Complaint: Dizziness, nausea, vomiting and chest pain  HPI: [name] is a 49 y.o. male with medical history significant of hypertension, hyperlipidemia, depression/anxiety, degenerative disc disease in lumbar region, obesity presents to emergency department for the evaluation of dizziness.  Patient tells me that while he was driving this morning he felt dizzy and started having nausea and vomiting and diaphoresis.  He was able to pull over and called EMS and came to ER for further evaluation and management.  Reports worsening of dizziness when he opens his eyes and gets better with lying still and keeping his eyes closed.  Reports chest pain yesterday which resolved on its own.  Reports headache however denies blurry vision, slurred speech, facial drop, loss of consciousness, seizure, head trauma, fever, chills, shortness of breath, cough, congestion, urinary symptoms.  He lives with his wife at home.  No history of smoking, alcohol, listed drug use.  ED Course: Upon arrival to ED ED: Patient tachycardic, tachypneic, hypothermic, no leukocytosis, potassium: 3.3, troponin x2 -, TSH, UA: Negative.  Chest x-ray shows tortuosity of thoracic aorta.  MRI brain pending.  Patient received IV fluids, potassium supplement, Zofran, Reglan, Ativan and Benadryl in the ED.  [facility] hospital[facility] consulted for admission for dizziness and intractable nausea and vomiting and decreased p.o. intake.  Review of Systems: As per HPI otherwise negative.    Past Medical History:  Diagnosis Date  . Arthritis    neck  . Carpal tunnel syndrome on both sides 08/2014  . DDD (degenerative disc disease), lumbar   . History of kidney stones   . Hypertension    under control with med., has been  on med. x 1 yr.  . Sleep apnea    CPAP but does not use    Past Surgical History:  Procedure Laterality Date  . CARPAL TUNNEL RELEASE Left 09/07/2014   Procedure: LEFT CARPAL TUNNEL RELEASE;  Surgeon: [name], MD;  Location: [facility];  Service: Orthopedics;  Laterality: Left;  . CARPAL TUNNEL RELEASE Right 10/12/2014   Procedure: RIGHT CARPAL TUNNEL RELEASE;  Surgeon: [name], MD;  Location: [facility];  Service: Orthopedics;  Laterality: Right;  . KNEE ARTHROSCOPY Left      reports that he has never smoked. He has never used smokeless tobacco. He reports current alcohol use. He reports that he does not use drugs.  No Known Allergies  History reviewed. No pertinent family history.  Prior to Admission medications   Medication Sig Start Date End Date Taking? Authorizing Provider  atorvastatin (LIPITOR) 10 MG tablet Take 10 mg by mouth daily.   Yes [provider]  clonazePAM (KLONOPIN) 0.5 MG tablet Take 0.5 mg by mouth as needed (sleep).  09/01/19  Yes [provider]  escitalopram (LEXAPRO) 10 MG tablet Take 10 mg by mouth daily. 10/28/19  Yes [provider]  losartan (COZAAR) 50 MG tablet Take 50 mg by mouth daily.   Yes [provider]  HYDROcodone-acetaminophen (NORCO/VICODIN) 5-325 MG tablet Take 1 tablet by mouth every 4 (four) hours as needed. Patient not taking: Reported on 07/31/2019 07/05/19   Garlon Hatchet, [name]  ibuprofen (ADVIL,MOTRIN) 800 MG tablet Take 1 tablet (800 mg total) by mouth every 8 (eight) hours as needed for  moderate pain. Patient not taking: Reported on 11/16/2019 06/02/18   Little, Ambrose Finlandachel Morgan, MD  ondansetron (ZOFRAN ODT) 4 MG disintegrating tablet Take 1 tablet (4 mg total) by mouth every 8 (eight) hours as needed for nausea. Patient not taking: Reported on 07/31/2019 07/05/19   Garlon HatchetSanders, Lisa M, [name]  oxyCODONE-acetaminophen (PERCOCET) 5-325 MG tablet Take 1 tablet by mouth every  4 (four) hours as needed for moderate pain. Patient not taking: Reported on 07/31/2019 05/14/17   Dione BoozeGlick, David, MD    Physical Exam: Vitals:   11/16/19 1215 11/16/19 1400 11/16/19 1415 11/16/19 1430  BP: 126/79 (!) 156/84 (!) 155/97 123/67  Pulse: (!) 103 93 (!) 105 94  Resp: (!) 21 (!) 23 (!) 23 (!) 23  Temp:      TempSrc:      SpO2: 97% 98% 100% 100%    Constitutional: NAD, calm, comfortable, on room air Eyes: PERRL, lids and conjunctivae normal ENMT: Mucous membranes are moist. Posterior pharynx clear of any exudate or lesions.Normal dentition.  Neck: normal, supple, no masses, no thyromegaly Respiratory: clear to auscultation bilaterally, no wheezing, no crackles. Normal respiratory effort. No accessory muscle use.  Cardiovascular: Regular rate and rhythm, no murmurs / rubs / gallops. No extremity edema. 2+ pedal pulses. No carotid bruits.  Abdomen: no tenderness, no masses palpated. No hepatosplenomegaly. Bowel sounds positive.  Musculoskeletal: no clubbing / cyanosis. No joint deformity upper and lower extremities. Good ROM, no contractures. Normal muscle tone.  Skin: no rashes, lesions, ulcers. No induration Neurologic: CN 2-12 grossly intact. Sensation intact, DTR normal. Strength 5/5 in all 4.  Psychiatric: Normal judgment and insight. Alert and oriented x 3. Normal mood.    Labs on Admission: I have personally reviewed following labs and imaging studies  CBC: Recent Labs  Lab 11/16/19 0834  WBC 5.3  NEUTROABS 3.1  HGB 15.4  HCT 45.6  MCV 93.1  PLT 219   Basic Metabolic Panel: Recent Labs  Lab 11/16/19 0834  NA 140  K 3.3*  CL 107  CO2 20*  GLUCOSE 189*  BUN 11  CREATININE 0.98  CALCIUM 8.7*   GFR: CrCl cannot be calculated (Unknown ideal weight.). Liver Function Tests: Recent Labs  Lab 11/16/19 0834  AST 34  ALT 48*  ALKPHOS 68  BILITOT 0.8  PROT 6.8  ALBUMIN 4.0   No results for input(s): LIPASE, AMYLASE in the last 168 hours. No results  for input(s): AMMONIA in the last 168 hours. Coagulation Profile: No results for input(s): INR, PROTIME in the last 168 hours. Cardiac Enzymes: No results for input(s): CKTOTAL, CKMB, CKMBINDEX, TROPONINI in the last 168 hours. BNP (last 3 results) No results for input(s): PROBNP in the last 8760 hours. HbA1C: No results for input(s): HGBA1C in the last 72 hours. CBG: Recent Labs  Lab 11/16/19 0803  GLUCAP 191*   Lipid Profile: No results for input(s): CHOL, HDL, LDLCALC, TRIG, CHOLHDL, LDLDIRECT in the last 72 hours. Thyroid Function Tests: Recent Labs    11/16/19 0834  TSH 1.525   Anemia Panel: No results for input(s): VITAMINB12, FOLATE, FERRITIN, TIBC, IRON, RETICCTPCT in the last 72 hours. Urine analysis:    Component Value Date/Time   COLORURINE STRAW (A) 11/16/2019 1255   APPEARANCEUR CLEAR 11/16/2019 1255   LABSPEC 1.015 11/16/2019 1255   PHURINE 6.0 11/16/2019 1255   GLUCOSEU >=500 (A) 11/16/2019 1255   HGBUR NEGATIVE 11/16/2019 1255   BILIRUBINUR NEGATIVE 11/16/2019 1255   KETONESUR 5 (A) 11/16/2019 1255   PROTEINUR  NEGATIVE 11/16/2019 1255   UROBILINOGEN 1.0 02/14/2007 1726   NITRITE NEGATIVE 11/16/2019 1255   LEUKOCYTESUR NEGATIVE 11/16/2019 1255    Radiological Exams on Admission: DG Chest 2 View  Result Date: 11/16/2019 CLINICAL DATA:  Dizziness and lethargy. EXAM: CHEST - 2 VIEW COMPARISON:  Earlier portable chest x-ray, same date. FINDINGS: Borderline cardiac enlargement with left ventricular configuration. Mild tortuosity of the thoracic aorta. The mediastinal and hilar contours are unremarkable. The lungs are clear. No pleural effusion. The bony thorax is intact. IMPRESSION: No acute cardiopulmonary findings. The slightly prominent mediastinal contour appear to be due to mild tortuosity of the thoracic aorta. Electronically Signed   By: Rudie Meyer M.D.   On: 11/16/2019 10:10   MR BRAIN WO CONTRAST  Result Date: 11/16/2019 CLINICAL DATA:  Persistent  vertigo; history of TIA, lupus; vertigo, central. EXAM: MRI HEAD WITHOUT CONTRAST TECHNIQUE: Multiplanar, multiecho pulse sequences of the brain and surrounding structures were obtained without intravenous contrast. COMPARISON:  No pertinent prior studies available for comparison. FINDINGS: Brain: There is a focus of restricted diffusion within the left cerebellar hemisphere measuring 4.1 x 3.3 cm in transaxial dimensions consistent with acute left PICA vascular territory infarct. Corresponding T2/FLAIR hyperintensity at this site. An additional punctate acute infarct versus artifact is questioned within the left occipital lobe (series 5, image 75). There is no significant white matter disease. No evidence of intracranial mass. No chronic intracranial blood products. No extra-axial fluid collection. No midline shift. Vascular: Expected proximal arterial flow voids. However, the left PICA is poorly assessed due to small vessel size. Skull and upper cervical spine: No focal marrow lesion. Sinuses/Orbits: Visualized orbits show no acute finding. Mild ethmoid sinus mucosal thickening. Small bilateral maxillary sinus mucous retention cysts. No significant mastoid effusion IMPRESSION: 4.1 cm acute left PICA territory ischemic infarct within the left cerebellar hemisphere. There is no significant posterior fossa mass effect at this time. An additional punctate acute infarct is questioned within the left occipital lobe. Otherwise unremarkable MRI appearance of the brain. Mild ethmoid sinus mucosal thickening. Small bilateral maxillary sinus mucous retention cysts. Electronically Signed   By: Jackey Loge DO   On: 11/16/2019 16:15   DG Chest Portable 1 View  Result Date: 11/16/2019 CLINICAL DATA:  Chest pain. EXAM: PORTABLE CHEST 1 VIEW COMPARISON:  02/14/2007. FINDINGS: Mild prominence of the upper mediastinum noted. This may be secondary to AP apical lordotic portable technique. Upright PA and lateral chest x-ray  suggested to exclude mediastinal widening. Borderline cardiomegaly also possibly related to AP technique. Low lung volumes with basilar atelectasis. No pleural effusion or pneumothorax. No displaced rib fracture identified. IMPRESSION: 1. Mild prominence of the upper mediastinum noted. This may be secondary to AP apical lordotic portable technique. Upright PA and lateral chest x-ray suggested to exclude mediastinal widening. Borderline cardiomegaly could also possibly related to AP technique. 2.  Low lung volumes with bibasilar atelectasis. This report was phoned to [facility] PA in the emergency room at 8:54 a.m. on 11/16/2019. Electronically Signed   By: Maisie Fus  Register   On: 11/16/2019 08:55    EKG: Independently reviewed.  Sinus rhythm.  Borderline prolonged PR interval.  No ST elevation or depression noted.  Assessment/Plan Principal Problem:   Ischemic stroke (HCC) Active Problems:   DDD (degenerative disc disease), lumbar   Hypertension   Intractable nausea and vomiting   Hypokalemia   Depression with anxiety    Ischemic stroke:  -Patient presented with dizziness with intractable nausea and vomiting.  Reviewed MRI brain. -admit forTelemetry monitoring -Allow for permissive hypertension for the first 24-48h - only treat PRN if SBP >220 mmHg or diastolic blood pressure >120. Blood pressures can be gradually normalized to SBP<140 upon discharge. -Check A1c, lipid panel and echocardiogram to- rule out PFO -Frequent neuro checks -Aspirin and statin -Consult Neurology-await recommendations -PT/OT eval, Speech consult -Frequent neuro check -TSH: WNL, UA: Negative for infection.  Chest x-ray negative for pneumonia. -Start on IV fluids.  Zofran as needed for nausea and vomiting.  Check lactic acid, magnesium, UDS, ethanol. -Meclizine as needed. -On fall precautions  Chest pain: Resolved -Troponin x2 -.  EKG: Sinus rhythm.  Borderline prolonged PR interval.  No ST elevation or  depression noted.  Hypokalemia: Likely secondary to vomiting -Replenished.  Check magnesium.  Repeat BMP tomorrow a.m.  Hypertension: Hold losartan to allow permissive hypertension  Hyperlipidemia: Check lipid panel.   -Continue statin  Depression/anxiety: Continue Lexapro and Klonopin  Lumbar degenerative disc disease: Stable -Tylenol as needed.  DVT prophylaxis: Lovenox/SCD Code Status: Full code Family Communication: Patient's wife present at bedside.  Plan of care discussed with patient and his wife at bedside in length and they verbalized understanding and agreed with it. Disposition Plan: Likely home in 2 days Consults called: neurology Admission status: Inpatient   Ollen Bowl MD [facility]  If 7PM-7AM, please contact night-coverage [email or web address] Password [facility]  11/16/2019, 4:36 PM

## 2019-11-16 NOTE — Plan of Care (Signed)
CTA with no LVO.  -- Milon Dikes, MD

## 2019-11-16 NOTE — ED Provider Notes (Addendum)
[facility] EMERGENCY DEPARTMENT Provider Note   CSN: [id number] Arrival date & time: 11/16/19  4818     History Chief Complaint  Patient presents with  . Chest Pain    [name] is a 49 y.o. male.  HPI Patient is a 49 year old male with past medical history significant for HTN, degenerative disc disease, right-sided dropfoot and history of "mini stroke"  Patient is presented today with sudden onset of vertigo/room spinning, nausea, vomiting, diaphoresis that occurred when he was driving his car prior to arrival in the ER today.  He was able to pull over and was transported by EMS.  Patient states that when he opens his eyes he feels like he is in the vomit.  His symptoms are significantly improved by laying still and keeping his eyes closed.  Patient is unable to open his eyes because of how severe the symptoms are when he opens them.  His medications include a benzodiazepine medication which he has been on for anxiety in the past however he was only recently started on this and took for 2 weeks and then discontinued approximately 2 weeks ago.      Past Medical History:  Diagnosis Date  . Arthritis    neck  . Carpal tunnel syndrome on both sides 08/2014  . DDD (degenerative disc disease), lumbar   . History of kidney stones   . Hypertension    under control with med., has been on med. x 1 yr.  . Sleep apnea    CPAP but does not use    Patient Active Problem List   Diagnosis Date Noted  . Hypertension   . Intractable nausea and vomiting   . Hypokalemia   . Depression with anxiety   . Ischemic stroke (HCC)   . Lumbar spondylolysis 07/31/2019  . Lumbar radiculopathy 04/07/2019  . Right foot drop 04/07/2019  . Backache 07/05/2017  . DDD (degenerative disc disease), lumbar 07/05/2017  . Spinal stenosis of lumbar region 07/05/2017    Past Surgical History:  Procedure Laterality Date  . CARPAL TUNNEL RELEASE Left 09/07/2014   Procedure: LEFT  CARPAL TUNNEL RELEASE;  Surgeon: [name], MD;  Location: [facility];  Service: Orthopedics;  Laterality: Left;  . CARPAL TUNNEL RELEASE Right 10/12/2014   Procedure: RIGHT CARPAL TUNNEL RELEASE;  Surgeon: [name], MD;  Location: [facility];  Service: Orthopedics;  Laterality: Right;  . KNEE ARTHROSCOPY Left        History reviewed. No pertinent family history.  Social History   Tobacco Use  . Smoking status: Never Smoker  . Smokeless tobacco: Never Used  Substance Use Topics  . Alcohol use: Yes    Comment: occasionally  . Drug use: No    Home Medications Prior to Admission medications   Medication Sig Start Date End Date Taking? Authorizing Provider  atorvastatin (LIPITOR) 10 MG tablet Take 10 mg by mouth daily.   Yes [provider]  clonazePAM (KLONOPIN) 0.5 MG tablet Take 0.5 mg by mouth as needed (sleep).  09/01/19  Yes [provider]  escitalopram (LEXAPRO) 10 MG tablet Take 10 mg by mouth daily. 10/28/19  Yes [provider]  losartan (COZAAR) 50 MG tablet Take 50 mg by mouth daily.   Yes [provider]  HYDROcodone-acetaminophen (NORCO/VICODIN) 5-325 MG tablet Take 1 tablet by mouth every 4 (four) hours as needed. Patient not taking: Reported on 07/31/2019 07/05/19   Garlon Hatchet, [name]  ibuprofen (ADVIL,MOTRIN)  800 MG tablet Take 1 tablet (800 mg total) by mouth every 8 (eight) hours as needed for moderate pain. Patient not taking: Reported on 11/16/2019 06/02/18   Little, Ambrose Finland, MD  ondansetron (ZOFRAN ODT) 4 MG disintegrating tablet Take 1 tablet (4 mg total) by mouth every 8 (eight) hours as needed for nausea. Patient not taking: Reported on 07/31/2019 07/05/19   Garlon Hatchet, [name]  oxyCODONE-acetaminophen (PERCOCET) 5-325 MG tablet Take 1 tablet by mouth every 4 (four) hours as needed for moderate pain. Patient not taking: Reported on 07/31/2019 05/14/17   Dione Booze, MD     Allergies    Patient has no known allergies.  Review of Systems   Review of Systems  Constitutional: Positive for diaphoresis and fatigue. Negative for chills and fever.  HENT: Negative for congestion.   Eyes: Negative for pain.  Respiratory: Negative for cough and shortness of breath.   Cardiovascular: Negative for chest pain and leg swelling.  Gastrointestinal: Positive for nausea and vomiting. Negative for abdominal pain and diarrhea.  Genitourinary: Negative for dysuria.  Musculoskeletal: Negative for myalgias.  Skin: Negative for rash.  Neurological: Positive for dizziness. Negative for headaches.    Physical Exam Updated Vital Signs BP 132/81 (BP Location: Right Arm)   Pulse (!) 106   Temp 98.3 F (36.8 C) (Oral)   Resp 19   Ht 6\' 3"  (1.905 m)   Wt (!) 144 kg   SpO2 100%   BMI 39.68 kg/m   Physical Exam Vitals and nursing note reviewed.  Constitutional:      General: He is in acute distress.     Appearance: He is obese.  HENT:     Head: Normocephalic and atraumatic.     Nose: Nose normal.     Mouth/Throat:     Mouth: Mucous membranes are dry.  Eyes:     General: No scleral icterus.    Comments: Pupils equal round and reactive to light and there accommodation.  Left-sided rotary nystagmus accentuated with leftward gaze.  EOMI.  Cardiovascular:     Rate and Rhythm: Normal rate and regular rhythm.     Pulses: Normal pulses.     Heart sounds: Normal heart sounds.  Pulmonary:     Effort: Pulmonary effort is normal. No respiratory distress.     Breath sounds: No wheezing.  Abdominal:     Palpations: Abdomen is soft.     Tenderness: There is no abdominal tenderness. There is no guarding or rebound.  Musculoskeletal:     Cervical back: Normal range of motion.     Right lower leg: No edema.     Left lower leg: No edema.     Comments: Strength 5/5 in all upper and lower extremity joints.  Notably patient has right foot drop which is chronic.  Skin:     General: Skin is warm and dry.     Capillary Refill: Capillary refill takes less than 2 seconds.  Neurological:     Mental Status: He is alert. Mental status is at baseline.     Comments: Alert and oriented to self, place, time and event.   Speech is fluent, clear without dysarthria or dysphasia.   Strength 5/5 in upper/lower extremities  Sensation intact in upper/lower extremities   Negative Romberg. No pronator drift.  Normal finger-to-nose and feet tapping.  CN I not tested  CN II grossly intact visual fields bilaterally. Did not visualize posterior eye.   CN III, IV, VI PERRLA and EOMs  intact bilaterally. Nystagmus present -- rotary w leftward gaze CN V Intact sensation to sharp and light touch to the face  CN VII facial movements symmetric  CN VIII not tested  CN IX, X no uvula deviation, symmetric rise of soft palate  CN XI 5/5 SCM and trapezius strength bilaterally  CN XII Midline tongue protrusion, symmetric L/R movements   Psychiatric:        Mood and Affect: Mood normal.        Behavior: Behavior normal.     ED Results / Procedures / Treatments   Labs (all labs ordered are listed, but only abnormal results are displayed) Labs Reviewed  COMPREHENSIVE METABOLIC PANEL - Abnormal; Notable for the following components:      Result Value   Potassium 3.3 (*)    CO2 20 (*)    Glucose, Bld 189 (*)    Calcium 8.7 (*)    ALT 48 (*)    All other components within normal limits  URINALYSIS, ROUTINE W REFLEX MICROSCOPIC - Abnormal; Notable for the following components:   Color, Urine STRAW (*)    Glucose, UA >=500 (*)    Ketones, ur 5 (*)    All other components within normal limits  PHOSPHORUS - Abnormal; Notable for the following components:   Phosphorus 2.0 (*)    All other components within normal limits  RAPID URINE DRUG SCREEN, HOSP PERFORMED - Abnormal; Notable for the following components:   Tetrahydrocannabinol POSITIVE (*)    All other components within normal  limits  LACTIC ACID, PLASMA - Abnormal; Notable for the following components:   Lactic Acid, Venous 5.2 (*)    All other components within normal limits  HEMOGLOBIN A1C - Abnormal; Notable for the following components:   Hgb A1c MFr Bld 5.7 (*)    All other components within normal limits  LIPID PANEL - Abnormal; Notable for the following components:   HDL 40 (*)    LDL Cholesterol 134 (*)    All other components within normal limits  CBG MONITORING, ED - Abnormal; Notable for the following components:   Glucose-Capillary 191 (*)    All other components within normal limits  SARS CORONAVIRUS 2 BY RT PCR (HOSPITAL ORDER, PERFORMED IN [facility])  CBC WITH DIFFERENTIAL/PLATELET  TSH  HIV ANTIBODY (ROUTINE TESTING W REFLEX)  CBC  CREATININE, SERUM  MAGNESIUM  ETHANOL  LACTIC ACID, PLASMA  CBC  COMPREHENSIVE METABOLIC PANEL  ANTITHROMBIN III  PROTEIN C ACTIVITY  PROTEIN C, TOTAL  PROTEIN S ACTIVITY  PROTEIN S, TOTAL  LUPUS ANTICOAGULANT PANEL  BETA-2-GLYCOPROTEIN I ABS, IGG/M/A  HOMOCYSTEINE  FACTOR 5 LEIDEN  PROTHROMBIN GENE MUTATION  CARDIOLIPIN ANTIBODIES, IGG, IGM, IGA  TROPONIN I (HIGH SENSITIVITY)  TROPONIN I (HIGH SENSITIVITY)    EKG EKG Interpretation  Date/Time:  Thursday November 16 2019 08:03:42 EDT Ventricular Rate:  73 PR Interval:    QRS Duration: 123 QT Interval:  402 QTC Calculation: 443 R Axis:   61 Text Interpretation: Sinus rhythm Borderline prolonged PR interval Nonspecific intraventricular conduction delay Borderline repolarization abnormality Confirmed by Blane Ohara [id number]) on 11/16/2019 8:09:35 AM   Radiology DG Chest 2 View  Result Date: 11/16/2019 CLINICAL DATA:  Dizziness and lethargy. EXAM: CHEST - 2 VIEW COMPARISON:  Earlier portable chest x-ray, same date. FINDINGS: Borderline cardiac enlargement with left ventricular configuration. Mild tortuosity of the thoracic aorta. The mediastinal and hilar contours are unremarkable.  The lungs are clear. No pleural effusion. The bony thorax  is intact. IMPRESSION: No acute cardiopulmonary findings. The slightly prominent mediastinal contour appear to be due to mild tortuosity of the thoracic aorta. Electronically Signed   By: Rudie Meyer M.D.   On: 11/16/2019 10:10   MR BRAIN WO CONTRAST  Addendum Date: 11/16/2019   ADDENDUM REPORT: 11/16/2019 16:35 ADDENDUM: These results were called by telephone at the time of interpretation on 11/16/2019 at 4:34 pm to provider Dr. Jacqulyn Bath, who verbally acknowledged these results. Electronically Signed   By: Jackey Loge DO   On: 11/16/2019 16:35   Result Date: 11/16/2019 CLINICAL DATA:  Persistent vertigo; history of TIA, lupus; vertigo, central. EXAM: MRI HEAD WITHOUT CONTRAST TECHNIQUE: Multiplanar, multiecho pulse sequences of the brain and surrounding structures were obtained without intravenous contrast. COMPARISON:  No pertinent prior studies available for comparison. FINDINGS: Brain: There is a focus of restricted diffusion within the left cerebellar hemisphere measuring 4.1 x 3.3 cm in transaxial dimensions consistent with acute left PICA vascular territory infarct. Corresponding T2/FLAIR hyperintensity at this site. An additional punctate acute infarct versus artifact is questioned within the left occipital lobe (series 5, image 75). There is no significant white matter disease. No evidence of intracranial mass. No chronic intracranial blood products. No extra-axial fluid collection. No midline shift. Vascular: Expected proximal arterial flow voids. However, the left PICA is poorly assessed due to small vessel size. Skull and upper cervical spine: No focal marrow lesion. Sinuses/Orbits: Visualized orbits show no acute finding. Mild ethmoid sinus mucosal thickening. Small bilateral maxillary sinus mucous retention cysts. No significant mastoid effusion IMPRESSION: 4.1 cm acute left PICA territory ischemic infarct within the left cerebellar  hemisphere. There is no significant posterior fossa mass effect at this time. An additional punctate acute infarct is questioned within the left occipital lobe. Otherwise unremarkable MRI appearance of the brain. Mild ethmoid sinus mucosal thickening. Small bilateral maxillary sinus mucous retention cysts. Electronically Signed: By: Jackey Loge DO On: 11/16/2019 16:15   DG Chest Portable 1 View  Result Date: 11/16/2019 CLINICAL DATA:  Chest pain. EXAM: PORTABLE CHEST 1 VIEW COMPARISON:  02/14/2007. FINDINGS: Mild prominence of the upper mediastinum noted. This may be secondary to AP apical lordotic portable technique. Upright PA and lateral chest x-ray suggested to exclude mediastinal widening. Borderline cardiomegaly also possibly related to AP technique. Low lung volumes with basilar atelectasis. No pleural effusion or pneumothorax. No displaced rib fracture identified. IMPRESSION: 1. Mild prominence of the upper mediastinum noted. This may be secondary to AP apical lordotic portable technique. Upright PA and lateral chest x-ray suggested to exclude mediastinal widening. Borderline cardiomegaly could also possibly related to AP technique. 2.  Low lung volumes with bibasilar atelectasis. This report was phoned to [facility] PA in the emergency room at 8:54 a.m. on 11/16/2019. Electronically Signed   By: Maisie Fus  Register   On: 11/16/2019 08:55    Procedures .Critical Care Performed by: Gailen Shelter, PA Authorized by: Gailen Shelter, PA   Critical care provider statement:    Critical care time (minutes):  45   Critical care time was exclusive of:  Separately billable procedures and treating other patients and teaching time   Critical care was necessary to treat or prevent imminent or life-threatening deterioration of the following conditions: intractable vomiting, tachycardia, severe symptoms.   Critical care was time spent personally by me on the following activities:  Discussions with  consultants, evaluation of patient's response to treatment, examination of patient, review of old charts, re-evaluation of patient's condition, pulse  oximetry, ordering and review of radiographic studies, ordering and review of laboratory studies and ordering and performing treatments and interventions   I assumed direction of critical care for this patient from another provider in my specialty: no     (including critical care time)  Medications Ordered in ED Medications  escitalopram (LEXAPRO) tablet 10 mg (10 mg Oral Not Given 11/16/19 1708)  clonazePAM (KLONOPIN) tablet 0.5 mg (has no administration in time range)  enoxaparin (LOVENOX) injection 40 mg (40 mg Subcutaneous Given 11/16/19 1708)  acetaminophen (TYLENOL) tablet 650 mg (650 mg Oral Given 11/16/19 1838)    Or  acetaminophen (TYLENOL) suppository 650 mg ( Rectal See Alternative 11/16/19 1838)  ondansetron (ZOFRAN) tablet 4 mg (has no administration in time range)    Or  ondansetron (ZOFRAN) injection 4 mg (has no administration in time range)  0.9 % NaCl with KCl 20 mEq/ L  infusion ( Intravenous New Bag/Given 11/16/19 1848)  meclizine (ANTIVERT) tablet 25 mg (25 mg Oral Given 11/16/19 1838)  atorvastatin (LIPITOR) tablet 80 mg (has no administration in time range)  aspirin tablet 325 mg (325 mg Oral Given 11/16/19 1708)  ketorolac (TORADOL) 30 MG/ML injection 30 mg (has no administration in time range)  ondansetron (ZOFRAN) injection 4 mg (4 mg Intravenous Given 11/16/19 0822)  LORazepam (ATIVAN) injection 1 mg (1 mg Intravenous Given 11/16/19 0822)  metoCLOPramide (REGLAN) injection 10 mg (10 mg Intravenous Given 11/16/19 1028)  diphenhydrAMINE (BENADRYL) injection 25 mg (25 mg Intravenous Given 11/16/19 1027)  potassium chloride SA (KLOR-CON) CR tablet 40 mEq (40 mEq Oral Given 11/16/19 1028)  sodium chloride 0.9 % bolus 1,000 mL (0 mLs Intravenous Stopped 11/16/19 1217)  ondansetron (ZOFRAN) injection 4 mg (4 mg Intravenous Given 11/16/19 1313)   iohexol (OMNIPAQUE) 350 MG/ML injection 100 mL (100 mLs Intravenous Contrast Given 11/16/19 2103)    ED Course  I have reviewed the triage vital signs and the nursing notes.  Pertinent labs & imaging results that were available during my care of the patient were reviewed by me and considered in my medical decision making (see chart for details).   Patient with sudden onset of vertiginous symptoms nausea vomiting diaphoresis that began prior to arrival in ED today.  He he was brought by EMS and found to have a normal EKG.  Patient provided Zofran and transported with 1 L normal saline.  He has had persistent vomiting since that time.  Physical exam is notable for completely neurologically intact exam.  Unable to conduct hints exam or walk because of patient's symptoms.  Her symptoms are significantly worsened when he opens his eyes.  He does have leftward rotary nystagmus but is unidirectional.  No rightward beating nystagmus.  He denies any pain.  He was found to have a mildly low temperature initially however TSH is within normal limits and his temperature returned to normal suspect that this was operator error.  Urinalysis without any evidence of infection next to within normal limits.  CMP notable for mild hyperglycemia and mildly low potassium weekly orally.  CBC without leukocytosis or anemia.  I discussed this case with my attending physician who cosigned this note including patient's presenting symptoms, physical exam, and planned diagnostics and interventions. Attending physician stated agreement with plan or made changes to plan which were implemented.   Attending physician assessed patient at bedside.  Given his patient has a history of TIA and questionable history of lupus. With risk factors will obtain MRI to rule out central cause  of persistent vertigo.   Clinical Course as of Nov 16 2106  Thu Nov 16, 2019  1332 Plain film of chest portable with abnormality of the mediastinal  border.  Radiology called and recommended 2 view chest x-ray which is obtained and shows no acute abnormality.  Per radiology read the wide mediastinal appearance is a tortuosity of the aorta.    [WF]  1333 CBC without leukocytosis or anemia, CMP with mild hypokalemia 3.3 we will orally replete.  Glucose mildly elevated at 189 no anion gap.   [WF]  1333 Troponin X1 within normal limits.  Second troponin pending at this time.  TSH within normal limits doubt acute thyroid pathology.   [WF]  1334 Urinalysis with glucose present and some ketones likely secondary to poor p.o.   [WF]  1334 EKG without any evidence of ischemia.   [WF]  1334 MRI brain pending at this time.  Patient has received Ativan, Zofran X2, Reglan, Benadryl.  Remains unable to tolerate p.o. at this time.   [WF]  1542 Pt admitted to Dr. Jacqulyn BathPahwani who will accept patient to hospital for further workup and tx. Pt is intractably vomiting. MRI pending at this time.    [WF]    Clinical Course User Index [WF] Gailen ShelterFondaw, Jody Silas S, PA   MDM Rules/Calculators/A&P                          9:08 PM at time of shift change MRI still pending.  Discussed with MRI who states that because of frequent code strokes and emergent basins patient has not been able to have MRI done yet.  He will be sent MRI soon however.  This patient has had numerous medication for this point in still severe symptomatic, unable to open eyes, unable to ambulate, unable to tolerate p.o. will consult for admission at this time.   Final Clinical Impression(s) / ED Diagnoses Final diagnoses:  Vertiginous syndrome, unspecified laterality  Intractable vomiting with nausea, unspecified vomiting type  Chest pain, unspecified type    Rx / DC Orders ED Discharge Orders    None       Gailen ShelterFondaw, Abdulhamid Olgin S, [place] 11/16/19 1523    Gailen ShelterFondaw, Jynesis Nakamura S, [place] 11/16/19 2109    Blane OharaZavitz, Joshua, MD 11/18/19 1523

## 2019-11-17 ENCOUNTER — Inpatient Hospital Stay (HOSPITAL_COMMUNITY): Payer: 59 | Admitting: Anesthesiology

## 2019-11-17 ENCOUNTER — Inpatient Hospital Stay (HOSPITAL_COMMUNITY): Payer: 59

## 2019-11-17 ENCOUNTER — Encounter (HOSPITAL_COMMUNITY)
Admission: EM | Disposition: A | Discharge status: Rehabilitation Facility | Payer: Self-pay | Source: Home / Self Care | Attending: Neurology

## 2019-11-17 DIAGNOSIS — I6389 Other cerebral infarction: Secondary | ICD-10-CM

## 2019-11-17 DIAGNOSIS — I639 Cerebral infarction, unspecified: Secondary | ICD-10-CM

## 2019-11-17 HISTORY — PX: SUBOCCIPITAL CRANIECTOMY CERVICAL LAMINECTOMY: SHX5404

## 2019-11-17 LAB — COMPREHENSIVE METABOLIC PANEL
ALT: 38 U/L (ref 0–44)
AST: 23 U/L (ref 15–41)
Albumin: 4.1 g/dL (ref 3.5–5.0)
Alkaline Phosphatase: 69 U/L (ref 38–126)
Anion gap: 10 (ref 5–15)
BUN: 9 mg/dL (ref 6–20)
CO2: 22 mmol/L (ref 22–32)
Calcium: 9.1 mg/dL (ref 8.9–10.3)
Chloride: 107 mmol/L (ref 98–111)
Creatinine, Ser: 0.81 mg/dL (ref 0.61–1.24)
GFR calc Af Amer: >60 mL/min (ref >60)
GFR calc non Af Amer: >60 mL/min (ref >60)
Glucose, Bld: 117 mg/dL — ABNORMAL HIGH (ref 70–99)
Potassium: 3.4 mmol/L — ABNORMAL LOW (ref 3.5–5.1)
Sodium: 139 mmol/L (ref 135–145)
Total Bilirubin: 0.9 mg/dL (ref 0.3–1.2)
Total Protein: 7.2 g/dL (ref 6.5–8.1)

## 2019-11-17 LAB — TYPE AND SCREEN
ABO/RH(D): O POS
Antibody Screen: NEGATIVE

## 2019-11-17 LAB — SURGICAL PCR SCREEN
MRSA, PCR: NEGATIVE
Staphylococcus aureus: NEGATIVE

## 2019-11-17 LAB — ECHOCARDIOGRAM COMPLETE
Height: 75 in
Weight: 5079.4 oz

## 2019-11-17 LAB — CBC
HCT: 44.2 % (ref 39.0–52.0)
Hemoglobin: 15.2 g/dL (ref 13.0–17.0)
MCH: 31.9 pg (ref 26.0–34.0)
MCHC: 34.4 g/dL (ref 30.0–36.0)
MCV: 92.9 fL (ref 80.0–100.0)
Platelets: 218 10*3/uL (ref 150–400)
RBC: 4.76 MIL/uL (ref 4.22–5.81)
RDW: 12.3 % (ref 11.5–15.5)
WBC: 10.7 10*3/uL — ABNORMAL HIGH (ref 4.0–10.5)
nRBC: 0 % (ref 0.0–0.2)

## 2019-11-17 LAB — VITAMIN B12: Vitamin B-12: 194 pg/mL (ref 180–914)

## 2019-11-17 LAB — C-REACTIVE PROTEIN: CRP: <0.5 mg/dL (ref <1.0)

## 2019-11-17 LAB — SEDIMENTATION RATE: Sed Rate: 6 mm/hr (ref 0–16)

## 2019-11-17 LAB — ABO/RH: ABO/RH(D): O POS

## 2019-11-17 IMAGING — CT CT HEAD W/O CM
3 of 4 series · 13 of 47 positions shown, 15 images · non-contrast
Comparison: Head CT [DATE].

CLINICAL DATA: Stroke, follow-up. Suboccipital craniectomy for
decompression.

EXAM:
CT HEAD WITHOUT CONTRAST
TECHNIQUE: Contiguous axial images were obtained from the base of the skull
through the vertex without intravenous contrast.

[Series 3: head without · axial · non-contrast · 0.47mm/px · z∈[-233,-88]mm · 7 of 39 slices shown, 9 images]
[im 5/39  brain]
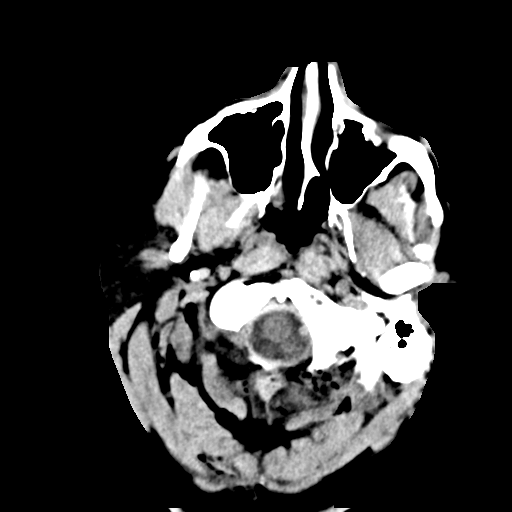
[im 5/39  bone]
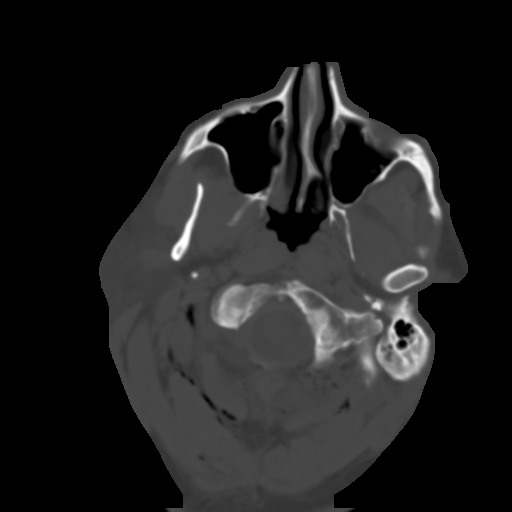
[im 10/39  brain]
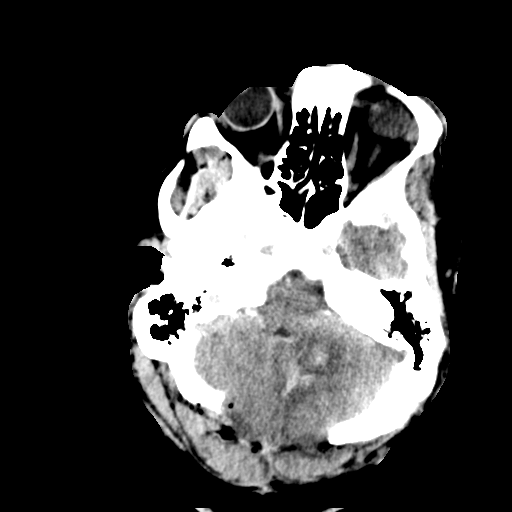
[im 15/39  brain]
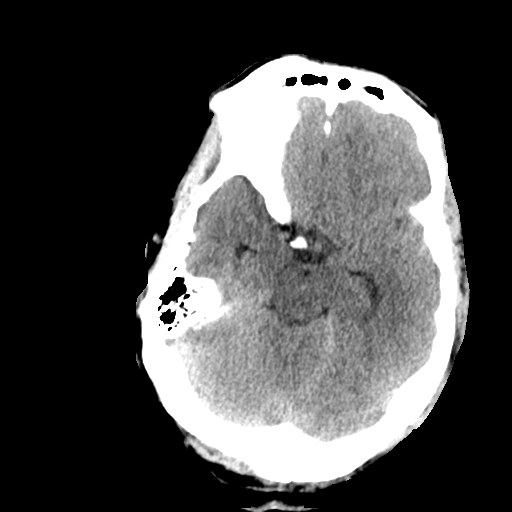
[im 20/39  brain]
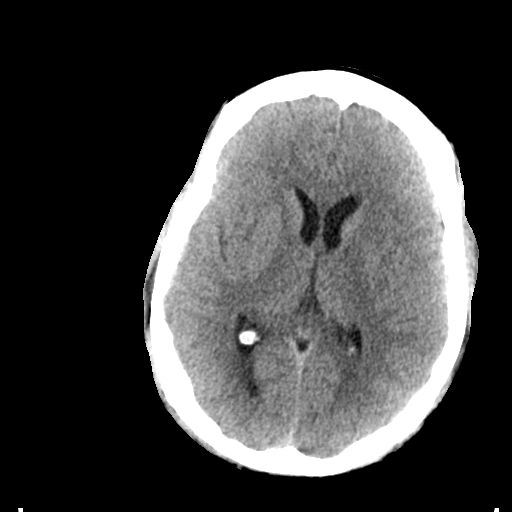
[im 24/39  brain]
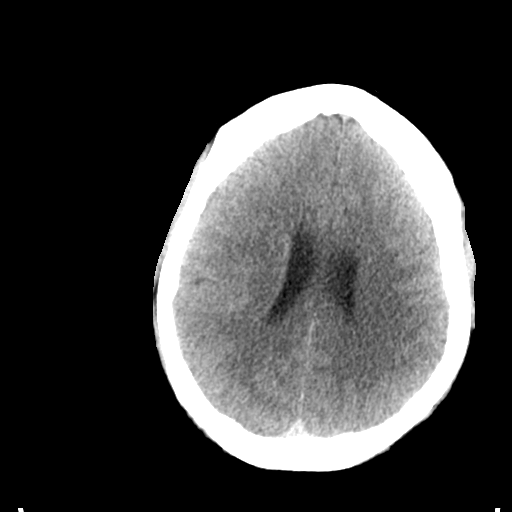
[im 24/39  bone]
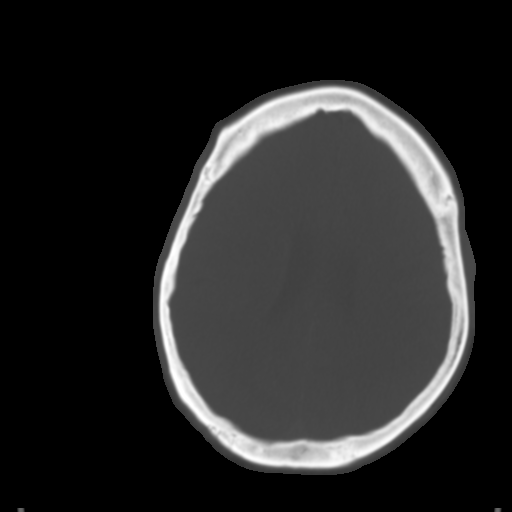
[im 29/39  brain]
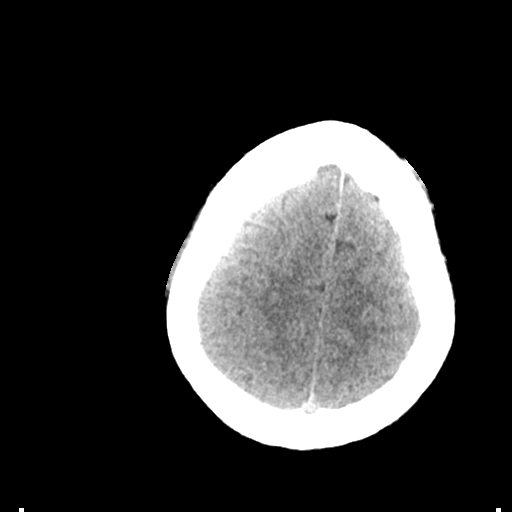
[im 34/39  brain]
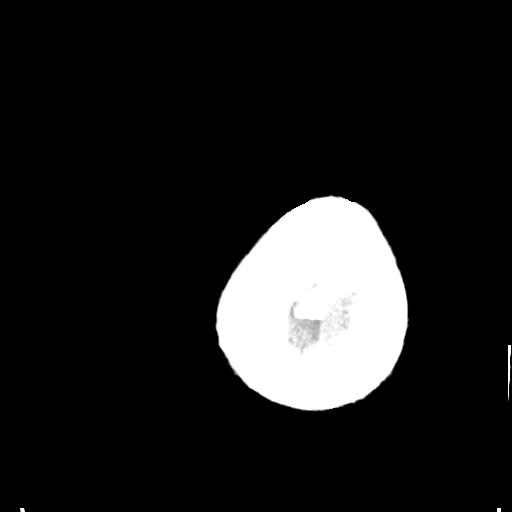

[Series 5: head without cor · coronal · non-contrast · 0.38mm/px · 3 of 80 slices shown]
[im 27/80  brain]
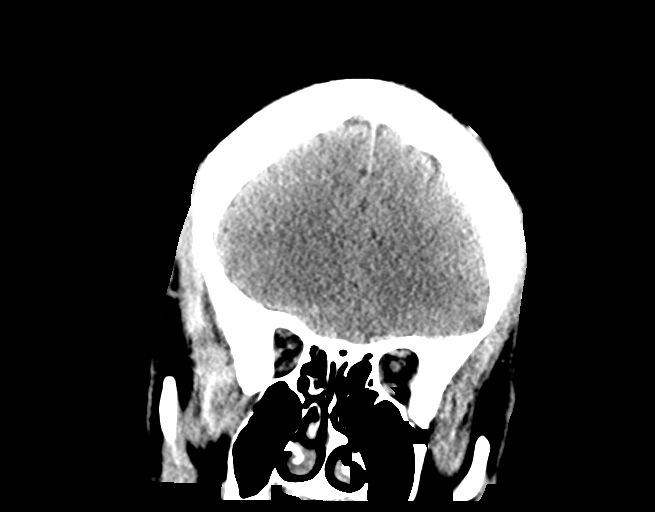
[im 36/80  brain]
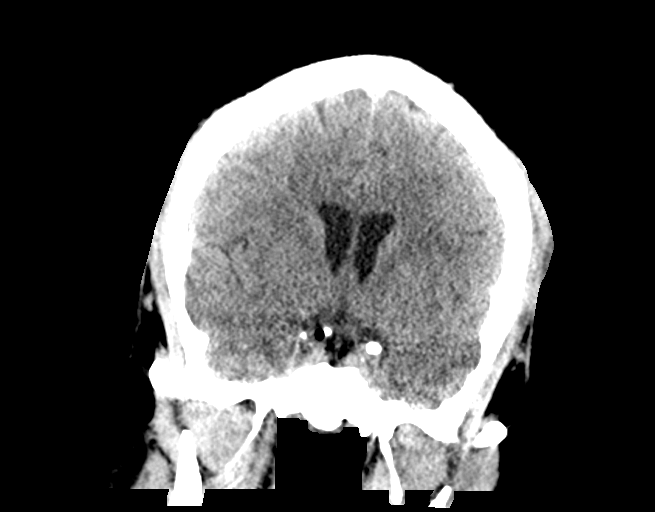
[im 44/80  brain]
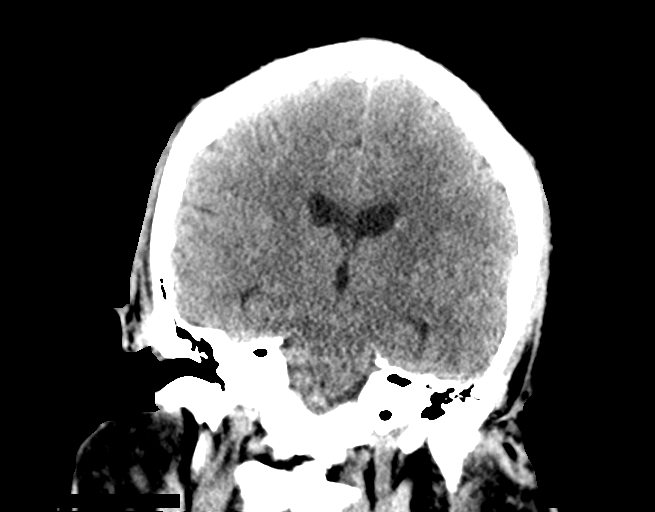

[Series 6: head without sag · sagittal · non-contrast · 0.38mm/px · 3 of 67 slices shown]
[im 23/67  brain]
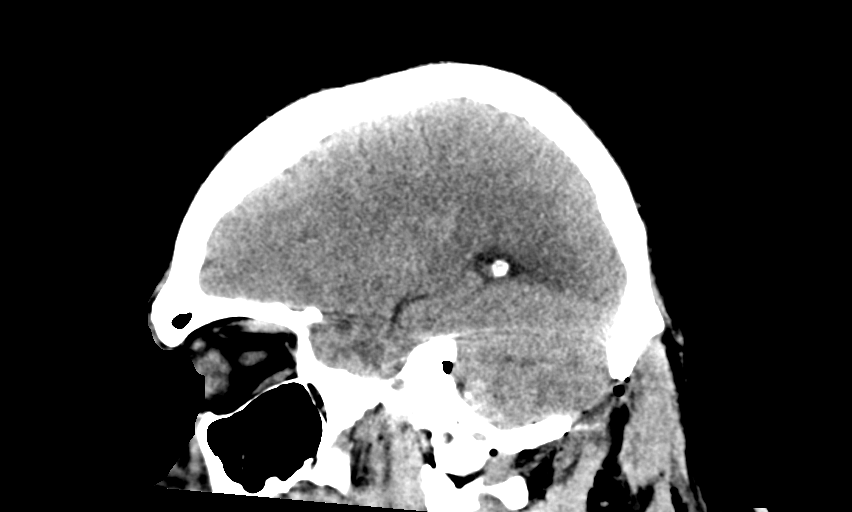
[im 34/67  brain]
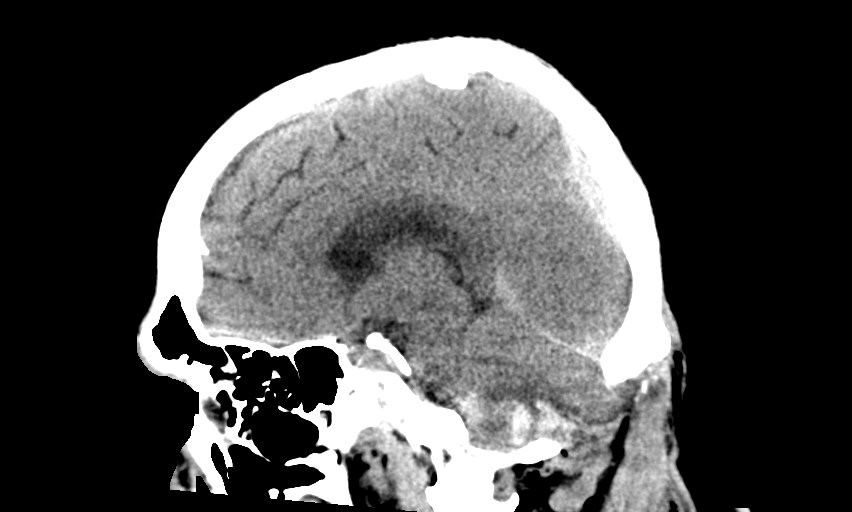
[im 45/67  brain]
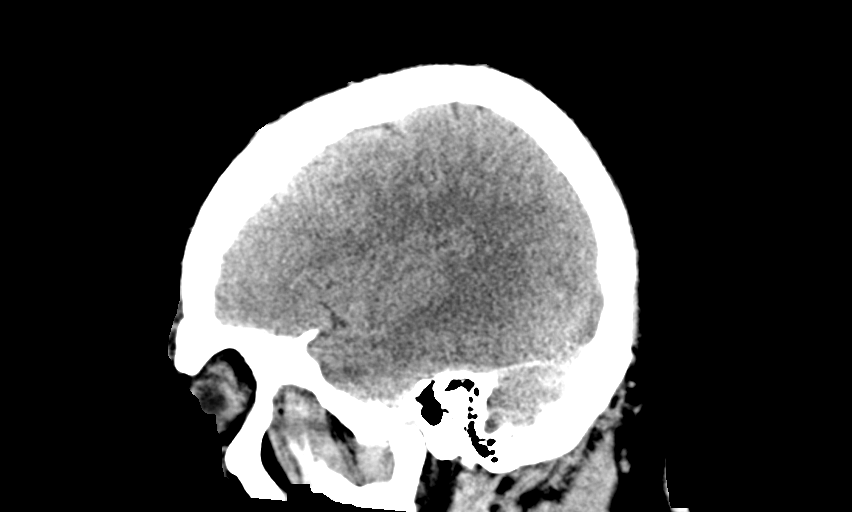

[13 of 47 positions shown; findings below may reference images not displayed]

FINDINGS: Brain:

Postoperative changes from interval suboccipital decompressive
craniectomy. There is scattered small volume acute subarachnoid
hemorrhage within the posterior fossa. Persistent abnormal
hypodensity at site of a subacute left cerebellar PICA territory
infarct. Persistent partial effacement of the fourth ventricle.
However, previously demonstrated mild lateral and third
ventriculomegaly appears resolved. Trace scattered postoperative
pneumocephalus predominantly within the posterior fossa.

No demarcated cortical infarct is identified.

No supratentorial midline shift

No evidence of intracranial mass.

No midline shift.

Vascular: No hyperdense vessel.

Skull: Suboccipital craniectomy.

Sinuses/Orbits: Visualized orbits show no acute finding. Mild
scattered paranasal sinus mucosal thickening. Small left maxillary
sinus mucous retention cyst. No significant mastoid effusion.

Other: Postsurgical changes to the suboccipital and upper neck soft
tissues with subcutaneous gas.
IMPRESSION: Postoperative changes from interval suboccipital decompressive
craniectomy.

There is scattered small volume postoperative subarachnoid
hemorrhage within the posterior fossa.

Redemonstrated parenchymal edema at site of a subacute left
cerebellar PICA territory infarct.

Persistent partial effacement of the fourth ventricle. However, the
previously demonstrated subtle lateral and third ventriculomegaly
appears resolved.

## 2019-11-17 IMAGING — CT CT HEAD W/O CM
4 series · 17 of 47 positions shown, 19 images · non-contrast
Comparison: [DATE]

CLINICAL DATA: Follow-up stroke

EXAM:
CT HEAD WITHOUT CONTRAST
TECHNIQUE: Contiguous axial images were obtained from the base of the skull
through the vertex without intravenous contrast.

[Series 3: head without · axial · non-contrast · 0.49mm/px · z∈[-142,+8]mm · 7 of 40 slices shown, 9 images]
[im 5/40  brain]
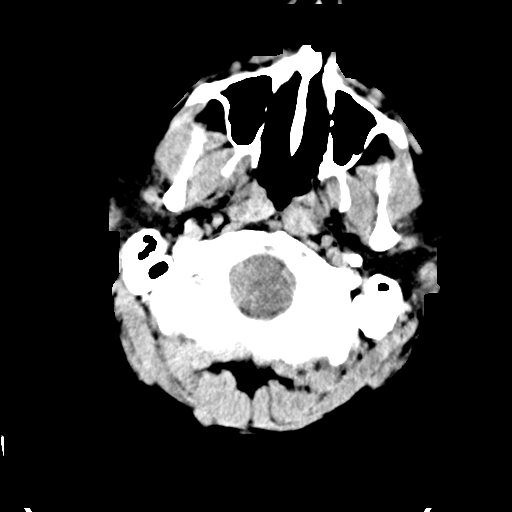
[im 5/40  bone]
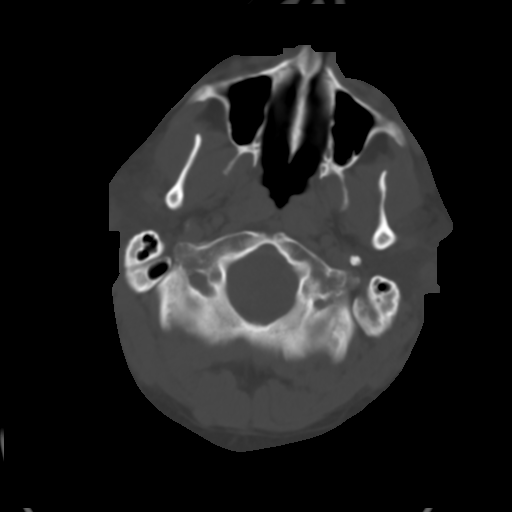
[im 10/40  brain]
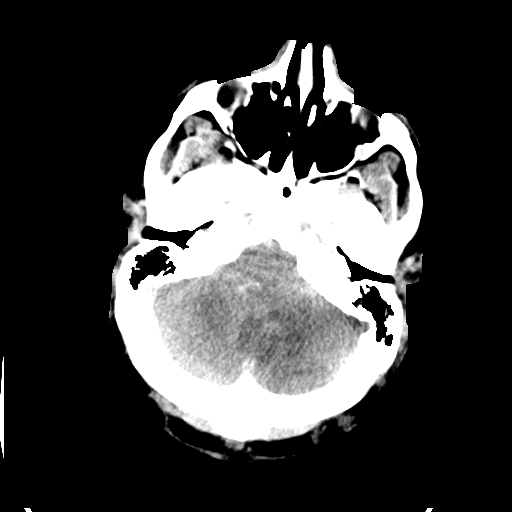
[im 15/40  brain]
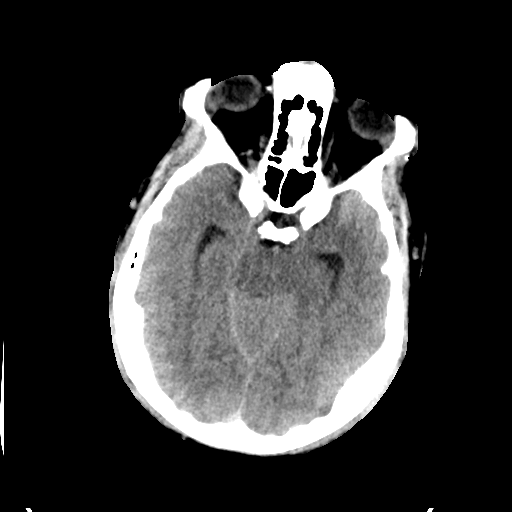
[im 20/40  brain]
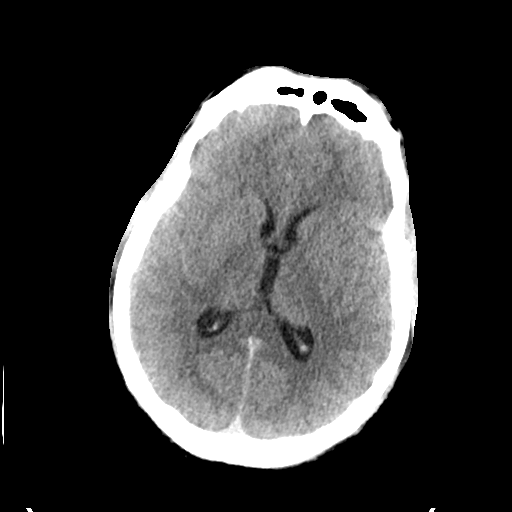
[im 25/40  brain]
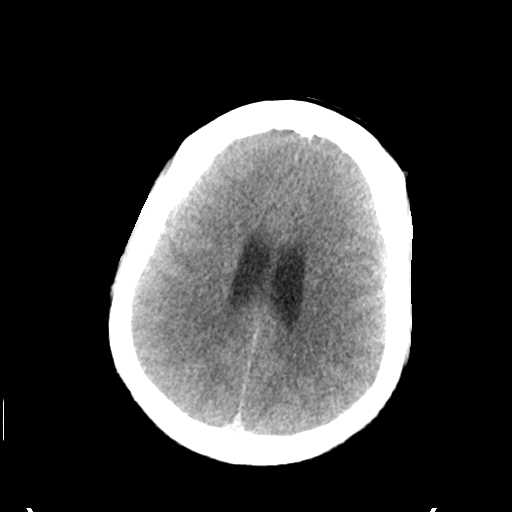
[im 25/40  bone]
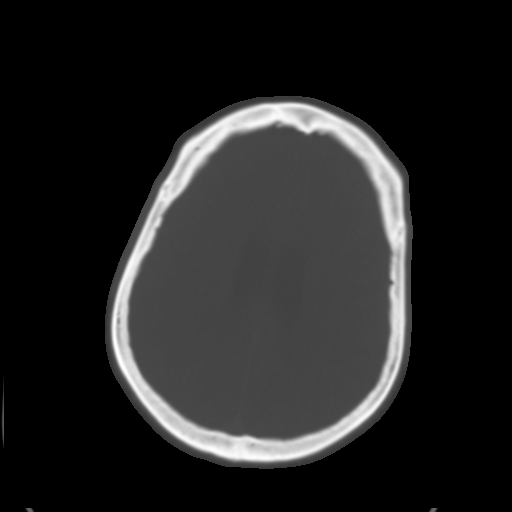
[im 30/40  brain]
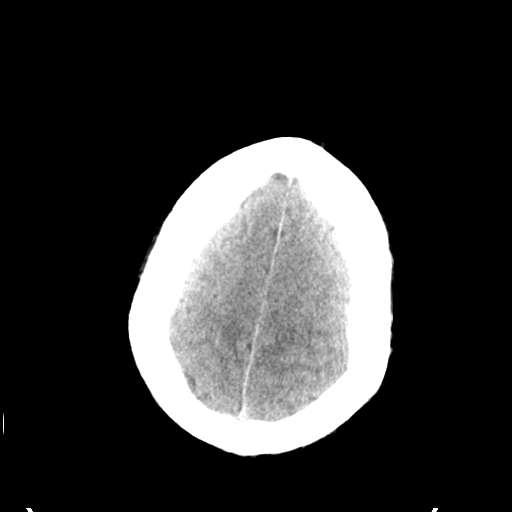
[im 35/40  brain]
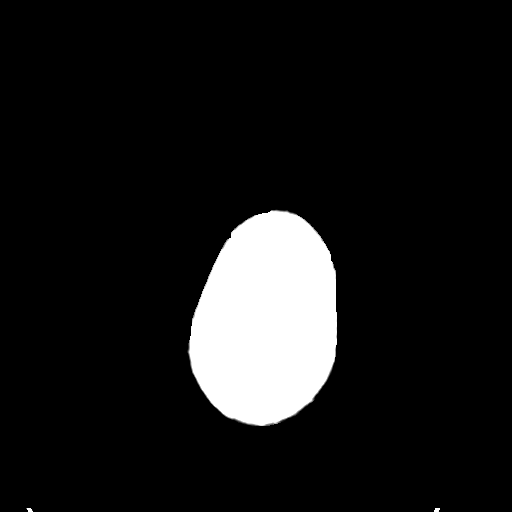

[Series 4: head bone · axial · 0.49mm/px · z∈[-144,-76]mm · 4 of 98 slices shown]
[im 10/98  bone]
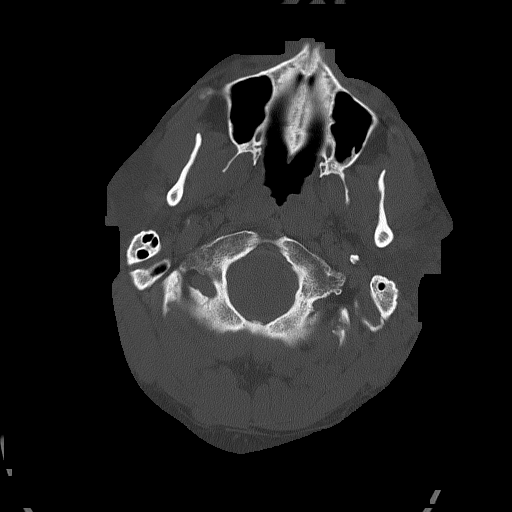
[im 20/98  bone]
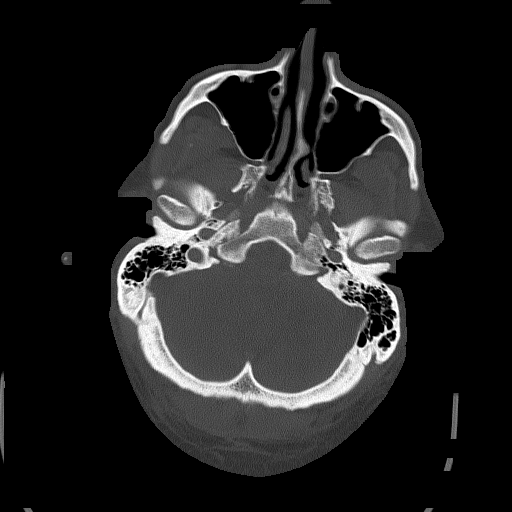
[im 30/98  bone]
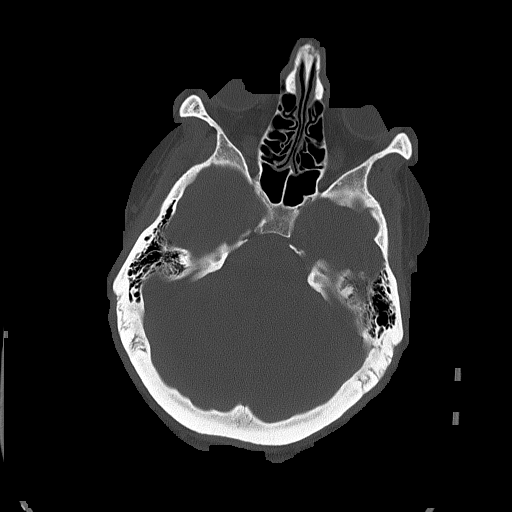
[im 44/98  bone]
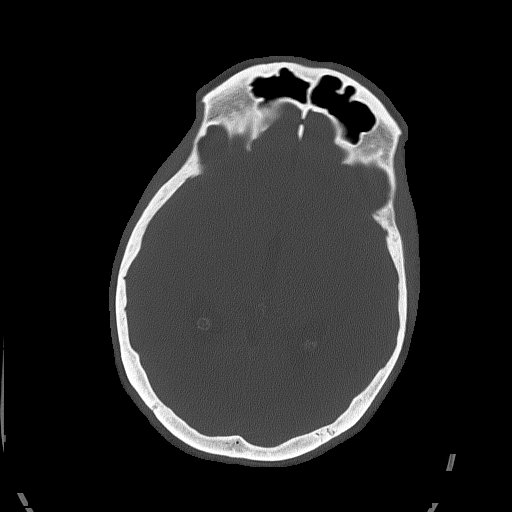

[Series 5: head without cor · coronal · non-contrast · 0.37mm/px · 3 of 75 slices shown]
[im 25/75  brain]
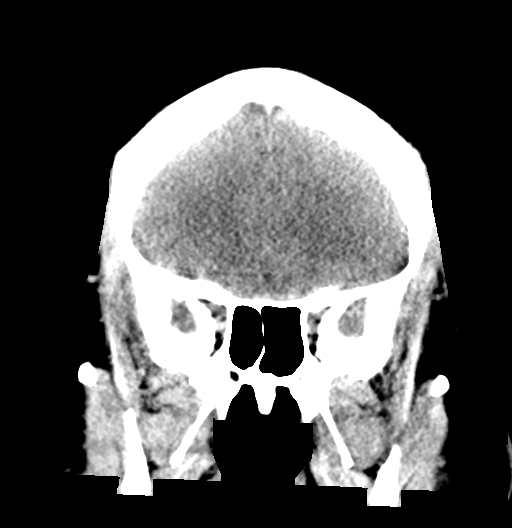
[im 33/75  brain]
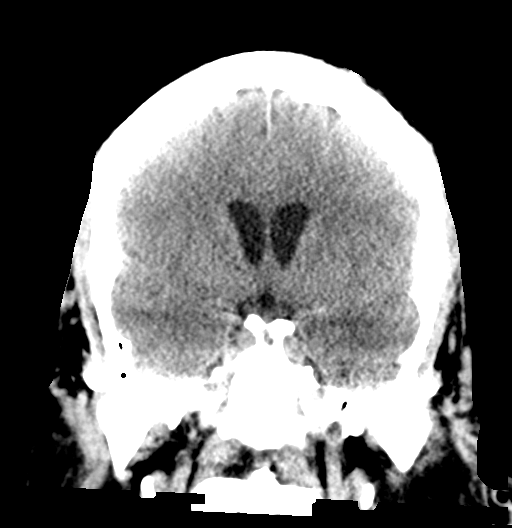
[im 42/75  brain]
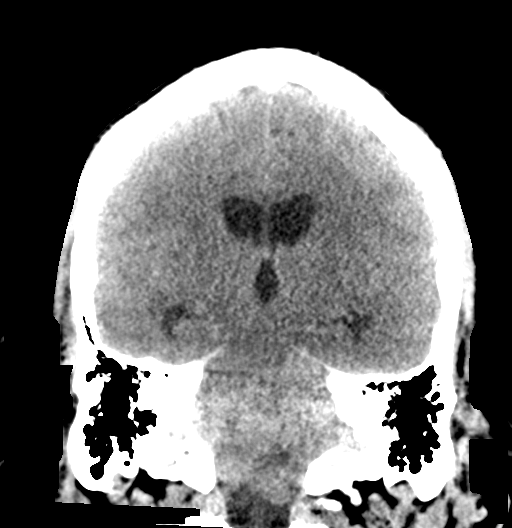

[Series 6: head without sag · sagittal · non-contrast · 0.38mm/px · 3 of 59 slices shown]
[im 20/59  brain]
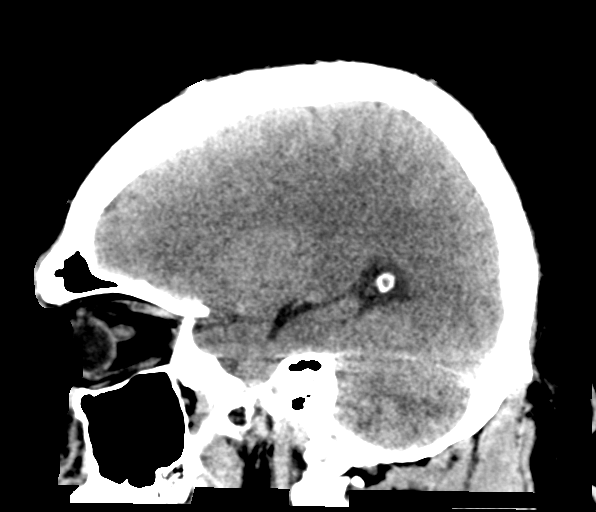
[im 30/59  brain]
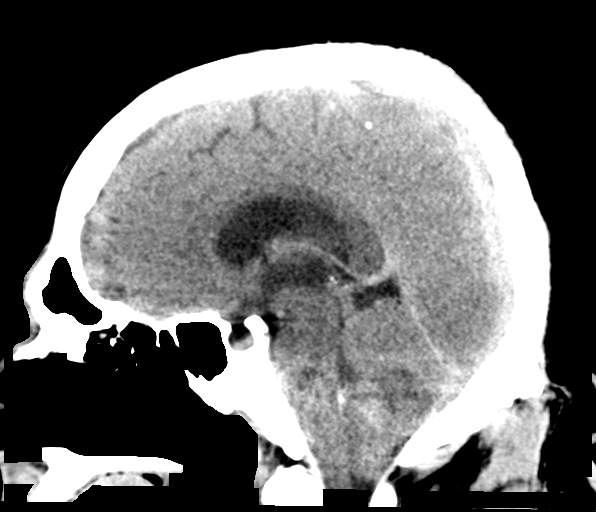
[im 39/59  brain]
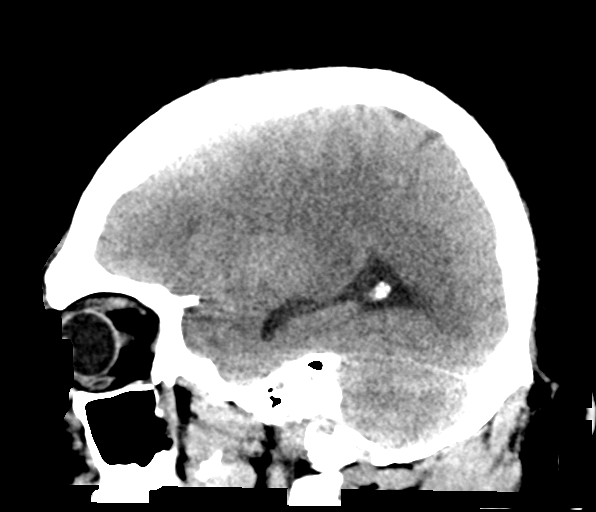

[17 of 47 positions shown; findings below may reference images not displayed]

FINDINGS: Brain: There again noted changes consistent with the known left
cerebellar infarct. No findings to suggest acute hemorrhage, new
infarct or space-occupying mass lesion are seen.

Vascular: No hyperdense vessel or unexpected calcification.

Skull: Normal. Negative for fracture or focal lesion.

Sinuses/Orbits: No acute finding.

Other: None.
IMPRESSION: Left cerebellar infarct relatively stable from the prior exam.

No new focal abnormality is seen.

## 2019-11-17 SURGERY — SUBOCCIPITAL CRANIECTOMY CERVICAL LAMINECTOMY/DURAPLASTY
Anesthesia: General | Site: Head

## 2019-11-17 MED ORDER — ROCURONIUM BROMIDE 10 MG/ML (PF) SYRINGE
PREFILLED_SYRINGE | INTRAVENOUS | Status: DC | PRN
  Administered 2019-11-17: 20 mg via INTRAVENOUS
  Administered 2019-11-17: 80 mg via INTRAVENOUS

## 2019-11-17 MED ORDER — NITROGLYCERIN 0.2 MG/ML ON CALL CATH LAB
INTRAVENOUS | Status: DC | PRN
  Administered 2019-11-17 (×2): 200 ug via INTRAVENOUS

## 2019-11-17 MED ORDER — ENOXAPARIN SODIUM 40 MG/0.4ML ~~LOC~~ SOLN
40.0000 mg | SUBCUTANEOUS | Status: DC
  Administered 2019-11-17: 40 mg via SUBCUTANEOUS
  Filled 2019-11-17: qty 0.4

## 2019-11-17 MED ORDER — NITROGLYCERIN IN D5W 200-5 MCG/ML-% IV SOLN
INTRAVENOUS | Status: AC
  Filled 2019-11-17: qty 250

## 2019-11-17 MED ORDER — CEFAZOLIN SODIUM-DEXTROSE 1-4 GM/50ML-% IV SOLN
INTRAVENOUS | Status: AC
  Filled 2019-11-17: qty 50

## 2019-11-17 MED ORDER — SODIUM CHLORIDE 0.9 % IV SOLN: INTRAVENOUS | Status: DC

## 2019-11-17 MED ORDER — PROMETHAZINE HCL 25 MG/ML IJ SOLN: 25.0000 mg | Freq: Four times a day (QID) | INTRAMUSCULAR | Status: DC | PRN

## 2019-11-17 MED ORDER — ESMOLOL HCL 100 MG/10ML IV SOLN
INTRAVENOUS | Status: DC | PRN
Start: 2019-11-17 — End: 2019-11-17
  Administered 2019-11-17 (×2): 50 mg via INTRAVENOUS

## 2019-11-17 MED ORDER — BUTALBITAL-APAP-CAFFEINE 50-325-40 MG PO TABS
2.0000 | ORAL_TABLET | Freq: Four times a day (QID) | ORAL | Status: DC | PRN
  Administered 2019-11-17 – 2019-11-22 (×12): 2 via ORAL
  Filled 2019-11-17 (×15): qty 2

## 2019-11-17 MED ORDER — LABETALOL HCL 5 MG/ML IV SOLN
INTRAVENOUS | Status: DC | PRN
  Administered 2019-11-17 (×6): 10 mg via INTRAVENOUS

## 2019-11-17 MED ORDER — LIDOCAINE-EPINEPHRINE 1 %-1:100000 IJ SOLN
INTRAMUSCULAR | Status: AC
  Filled 2019-11-17: qty 1

## 2019-11-17 MED ORDER — BUPIVACAINE HCL (PF) 0.5 % IJ SOLN
INTRAMUSCULAR | Status: AC
  Filled 2019-11-17: qty 30

## 2019-11-17 MED ORDER — CLEVIDIPINE BUTYRATE 0.5 MG/ML IV EMUL
INTRAVENOUS | Status: AC
  Filled 2019-11-17: qty 50

## 2019-11-17 MED ORDER — CLEVIDIPINE BUTYRATE 0.5 MG/ML IV EMUL
0.0000 mg/h | INTRAVENOUS | Status: DC
  Administered 2019-11-17: 1 mg/h via INTRAVENOUS

## 2019-11-17 MED ORDER — OXYCODONE HCL 5 MG PO TABS: 5.0000 mg | ORAL_TABLET | Freq: Once | ORAL | Status: DC | PRN

## 2019-11-17 MED ORDER — ESMOLOL HCL 100 MG/10ML IV SOLN
INTRAVENOUS | Status: DC | PRN
  Administered 2019-11-17: 60 mg via INTRAVENOUS

## 2019-11-17 MED ORDER — CLOPIDOGREL BISULFATE 75 MG PO TABS
75.0000 mg | ORAL_TABLET | Freq: Every day | ORAL | Status: DC
  Administered 2019-11-17: 75 mg via ORAL
  Filled 2019-11-17: qty 1

## 2019-11-17 MED ORDER — THROMBIN 5000 UNITS EX SOLR
CUTANEOUS | Status: AC
  Filled 2019-11-17: qty 5000

## 2019-11-17 MED ORDER — MORPHINE SULFATE (PF) 2 MG/ML IV SOLN
1.0000 mg | Freq: Once | INTRAVENOUS | Status: AC
  Administered 2019-11-17: 1 mg via INTRAVENOUS
  Filled 2019-11-17: qty 1

## 2019-11-17 MED ORDER — FENTANYL CITRATE (PF) 100 MCG/2ML IJ SOLN
INTRAMUSCULAR | Status: AC
  Filled 2019-11-17: qty 2

## 2019-11-17 MED ORDER — OXYCODONE HCL 5 MG/5ML PO SOLN: 5.0000 mg | Freq: Once | ORAL | Status: DC | PRN

## 2019-11-17 MED ORDER — CHLORHEXIDINE GLUCONATE CLOTH 2 % EX PADS
6.0000 | MEDICATED_PAD | Freq: Every day | CUTANEOUS | Status: DC
  Administered 2019-11-17 – 2019-11-21 (×4): 6 via TOPICAL

## 2019-11-17 MED ORDER — FENTANYL CITRATE (PF) 100 MCG/2ML IJ SOLN
25.0000 ug | INTRAMUSCULAR | Status: DC | PRN
  Administered 2019-11-17: 50 ug via INTRAVENOUS

## 2019-11-17 MED ORDER — PROPOFOL 10 MG/ML IV BOLUS
INTRAVENOUS | Status: DC | PRN
  Administered 2019-11-17: 200 mg via INTRAVENOUS

## 2019-11-17 MED ORDER — SODIUM CHLORIDE 0.9 % IV SOLN
0.0125 ug/kg/min | INTRAVENOUS | Status: DC
  Administered 2019-11-17: .2 ug/kg/min via INTRAVENOUS
  Filled 2019-11-17 (×3): qty 2000

## 2019-11-17 MED ORDER — OXYCODONE HCL 5 MG PO TABS
10.0000 mg | ORAL_TABLET | ORAL | Status: DC | PRN
  Administered 2019-11-18 – 2019-11-19 (×5): 10 mg via ORAL
  Filled 2019-11-17 (×5): qty 2

## 2019-11-17 MED ORDER — 0.9 % SODIUM CHLORIDE (POUR BTL) OPTIME
TOPICAL | Status: DC | PRN
  Administered 2019-11-17 (×2): 1000 mL

## 2019-11-17 MED ORDER — PHENYLEPHRINE HCL-NACL 10-0.9 MG/250ML-% IV SOLN
INTRAVENOUS | Status: DC | PRN
Start: 2019-11-17 — End: 2019-11-17
  Administered 2019-11-17: 50 ug/min via INTRAVENOUS
  Administered 2019-11-17: 25 ug/min via INTRAVENOUS

## 2019-11-17 MED ORDER — ONDANSETRON HCL 4 MG/2ML IJ SOLN
INTRAMUSCULAR | Status: DC | PRN
  Administered 2019-11-17: 4 mg via INTRAVENOUS

## 2019-11-17 MED ORDER — CEFAZOLIN SODIUM-DEXTROSE 1-4 GM/50ML-% IV SOLN
INTRAVENOUS | Status: DC | PRN
  Administered 2019-11-17: 3 g via INTRAVENOUS

## 2019-11-17 MED ORDER — STROKE: EARLY STAGES OF RECOVERY BOOK
Freq: Once | Status: DC
  Filled 2019-11-17 (×2): qty 1

## 2019-11-17 MED ORDER — OXYCODONE HCL 5 MG PO TABS
5.0000 mg | ORAL_TABLET | ORAL | Status: DC | PRN
  Administered 2019-11-17 – 2019-11-22 (×12): 5 mg via ORAL
  Filled 2019-11-17 (×12): qty 1

## 2019-11-17 MED ORDER — THROMBIN 20000 UNITS EX SOLR: CUTANEOUS | Status: DC | PRN

## 2019-11-17 MED ORDER — ACETAMINOPHEN 10 MG/ML IV SOLN
INTRAVENOUS | Status: DC | PRN
  Administered 2019-11-17: 1000 mg via INTRAVENOUS

## 2019-11-17 MED ORDER — LIDOCAINE HCL (CARDIAC) PF 100 MG/5ML IV SOSY
PREFILLED_SYRINGE | INTRAVENOUS | Status: DC | PRN
  Administered 2019-11-17: 80 mg via INTRAVENOUS

## 2019-11-17 MED ORDER — ACETAMINOPHEN 500 MG PO TABS: 1000.0000 mg | ORAL_TABLET | Freq: Once | ORAL | Status: DC | PRN

## 2019-11-17 MED ORDER — ASPIRIN EC 81 MG PO TBEC
81.0000 mg | DELAYED_RELEASE_TABLET | Freq: Every day | ORAL | Status: DC
  Administered 2019-11-17 – 2019-11-22 (×6): 81 mg via ORAL
  Filled 2019-11-17 (×6): qty 1

## 2019-11-17 MED ORDER — SODIUM CHLORIDE 0.9 % IV SOLN: INTRAVENOUS | Status: DC | PRN

## 2019-11-17 MED ORDER — CEFAZOLIN SODIUM-DEXTROSE 2-4 GM/100ML-% IV SOLN
INTRAVENOUS | Status: AC
  Filled 2019-11-17: qty 100

## 2019-11-17 MED ORDER — ACETAMINOPHEN 10 MG/ML IV SOLN: 1000.0000 mg | Freq: Once | INTRAVENOUS | Status: DC | PRN

## 2019-11-17 MED ORDER — SUGAMMADEX SODIUM 200 MG/2ML IV SOLN
INTRAVENOUS | Status: DC | PRN
Start: 2019-11-17 — End: 2019-11-17
  Administered 2019-11-17: 400 mg via INTRAVENOUS

## 2019-11-17 MED ORDER — KETOROLAC TROMETHAMINE 15 MG/ML IJ SOLN: 30.0000 mg | Freq: Once | INTRAMUSCULAR | Status: DC

## 2019-11-17 MED ORDER — BUPIVACAINE HCL (PF) 0.5 % IJ SOLN
INTRAMUSCULAR | Status: DC | PRN
  Administered 2019-11-17: 3.5 mL

## 2019-11-17 MED ORDER — HYDROMORPHONE HCL 1 MG/ML IJ SOLN
1.0000 mg | Freq: Once | INTRAMUSCULAR | Status: AC
  Administered 2019-11-17: 1 mg via INTRAVENOUS
  Filled 2019-11-17: qty 1

## 2019-11-17 MED ORDER — BACITRACIN ZINC 500 UNIT/GM EX OINT
TOPICAL_OINTMENT | CUTANEOUS | Status: AC
  Filled 2019-11-17: qty 28.35

## 2019-11-17 MED ORDER — SUCCINYLCHOLINE CHLORIDE 20 MG/ML IJ SOLN
INTRAMUSCULAR | Status: DC | PRN
  Administered 2019-11-17: 160 mg via INTRAVENOUS

## 2019-11-17 MED ORDER — HYDROMORPHONE HCL 1 MG/ML IJ SOLN
0.5000 mg | INTRAMUSCULAR | Status: DC | PRN
  Administered 2019-11-17 – 2019-11-19 (×9): 0.5 mg via INTRAVENOUS
  Filled 2019-11-17 (×10): qty 1

## 2019-11-17 MED ORDER — ESMOLOL HCL 100 MG/10ML IV SOLN
INTRAVENOUS | Status: AC
  Filled 2019-11-17: qty 10

## 2019-11-17 MED ORDER — MUPIROCIN 2 % EX OINT
1.0000 "application " | TOPICAL_OINTMENT | Freq: Two times a day (BID) | CUTANEOUS | Status: DC
  Administered 2019-11-17 – 2019-11-18 (×2): 1 via NASAL

## 2019-11-17 MED ORDER — THROMBIN 20000 UNITS EX SOLR
CUTANEOUS | Status: AC
  Filled 2019-11-17: qty 20000

## 2019-11-17 MED ORDER — CLEVIDIPINE BUTYRATE 0.5 MG/ML IV EMUL
0.0000 mg/h | INTRAVENOUS | Status: DC
  Administered 2019-11-17: 1 mg/h via INTRAVENOUS
  Administered 2019-11-18: 2 mg/h via INTRAVENOUS
  Administered 2019-11-18: 9 mg/h via INTRAVENOUS
  Administered 2019-11-19 – 2019-11-20 (×5): 21 mg/h via INTRAVENOUS
  Filled 2019-11-17: qty 50
  Filled 2019-11-17: qty 150
  Filled 2019-11-17: qty 100
  Filled 2019-11-17: qty 50
  Filled 2019-11-17 (×4): qty 100
  Filled 2019-11-17 (×2): qty 50
  Filled 2019-11-17: qty 100
  Filled 2019-11-17: qty 50

## 2019-11-17 MED ORDER — LIDOCAINE-EPINEPHRINE 1 %-1:100000 IJ SOLN
INTRAMUSCULAR | Status: DC | PRN
  Administered 2019-11-17: 3.5 mL

## 2019-11-17 MED ORDER — ACETAMINOPHEN 160 MG/5ML PO SOLN: 1000.0000 mg | Freq: Once | ORAL | Status: DC | PRN

## 2019-11-17 MED ORDER — ACETAMINOPHEN 10 MG/ML IV SOLN
INTRAVENOUS | Status: AC
  Filled 2019-11-17: qty 100

## 2019-11-17 MED ORDER — THROMBIN 5000 UNITS EX SOLR: OROMUCOSAL | Status: DC | PRN

## 2019-11-17 MED ORDER — BACITRACIN 500 UNIT/GM EX OINT
TOPICAL_OINTMENT | CUTANEOUS | Status: DC | PRN
  Administered 2019-11-17: 1 via TOPICAL

## 2019-11-17 SURGICAL SUPPLY — 59 items
BAND RUBBER #18 3X1/16 STRL (MISCELLANEOUS) IMPLANT
BENZOIN TINCTURE PRP APPL 2/3 (GAUZE/BANDAGES/DRESSINGS) IMPLANT
BLADE CLIPPER SURG (BLADE) ×2 IMPLANT
BLADE SURG 11 STRL SS (BLADE) ×2 IMPLANT
BUR ACORN 6.0 PRECISION (BURR) ×2 IMPLANT
BUR ACORN 9.0 PRECISION (BURR) ×2 IMPLANT
BUR PRECISION FLUTE 5.0 (BURR) IMPLANT
CANISTER SUCT 3000ML PPV (MISCELLANEOUS) ×2 IMPLANT
CLIP VESOCCLUDE MED 6/CT (CLIP) ×2 IMPLANT
COVER BACK TABLE 60X90IN (DRAPES) IMPLANT
COVER WAND RF STERILE (DRAPES) IMPLANT
DERMABOND ADVANCED (GAUZE/BANDAGES/DRESSINGS) ×1
DERMABOND ADVANCED .7 DNX12 (GAUZE/BANDAGES/DRESSINGS) ×1 IMPLANT
DRAPE LAPAROTOMY 100X72 PEDS (DRAPES) ×2 IMPLANT
DRAPE WARM FLUID 44X44 (DRAPES) ×2 IMPLANT
DURAPREP 6ML APPLICATOR 50/CS (WOUND CARE) ×2 IMPLANT
ELECT REM PT RETURN 9FT ADLT (ELECTROSURGICAL) ×2
ELECTRODE REM PT RTRN 9FT ADLT (ELECTROSURGICAL) ×1 IMPLANT
EVACUATOR 1/8 PVC DRAIN (DRAIN) IMPLANT
EVACUATOR SILICONE 100CC (DRAIN) IMPLANT
GAUZE 4X4 16PLY RFD (DISPOSABLE) IMPLANT
GAUZE SPONGE 4X4 12PLY STRL (GAUZE/BANDAGES/DRESSINGS) IMPLANT
GLOVE BIO SURGEON STRL SZ 6.5 (GLOVE) ×2 IMPLANT
GLOVE BIO SURGEON STRL SZ7.5 (GLOVE) ×2 IMPLANT
GLOVE BIOGEL PI IND STRL 6.5 (GLOVE) ×2 IMPLANT
GLOVE BIOGEL PI IND STRL 7.0 (GLOVE) ×3 IMPLANT
GLOVE BIOGEL PI IND STRL 7.5 (GLOVE) ×5 IMPLANT
GLOVE BIOGEL PI INDICATOR 6.5 (GLOVE) ×2
GLOVE BIOGEL PI INDICATOR 7.0 (GLOVE) ×3
GLOVE BIOGEL PI INDICATOR 7.5 (GLOVE) ×5
GOWN STRL REUS W/ TWL LRG LVL3 (GOWN DISPOSABLE) ×2 IMPLANT
GOWN STRL REUS W/ TWL XL LVL3 (GOWN DISPOSABLE) IMPLANT
GOWN STRL REUS W/TWL 2XL LVL3 (GOWN DISPOSABLE) IMPLANT
GOWN STRL REUS W/TWL LRG LVL3 (GOWN DISPOSABLE) ×4
GOWN STRL REUS W/TWL XL LVL3 (GOWN DISPOSABLE)
HEMOSTAT POWDER KIT SURGIFOAM (HEMOSTASIS) ×2 IMPLANT
HEMOSTAT SURGICEL 2X14 (HEMOSTASIS) IMPLANT
KIT BASIN OR (CUSTOM PROCEDURE TRAY) ×2 IMPLANT
KIT TURNOVER KIT B (KITS) ×2 IMPLANT
NEEDLE HYPO 25X1 1.5 SAFETY (NEEDLE) ×2 IMPLANT
NS IRRIG 1000ML POUR BTL (IV SOLUTION) ×6 IMPLANT
PACK CRANIOTOMY CUSTOM (CUSTOM PROCEDURE TRAY) ×2 IMPLANT
PAD ARMBOARD 7.5X6 YLW CONV (MISCELLANEOUS) ×6 IMPLANT
PATTIES SURGICAL .5 X1 (DISPOSABLE) IMPLANT
PATTIES SURGICAL 1/4 X 3 (GAUZE/BANDAGES/DRESSINGS) IMPLANT
SPONGE LAP 4X18 RFD (DISPOSABLE) IMPLANT
SPONGE SURGIFOAM ABS GEL 100 (HEMOSTASIS) ×2 IMPLANT
STAPLER SKIN PROX WIDE 3.9 (STAPLE) ×2 IMPLANT
SUT ETHILON 3 0 FSL (SUTURE) IMPLANT
SUT NURALON 4 0 TR CR/8 (SUTURE) ×4 IMPLANT
SUT PROLENE 6 0 BV (SUTURE) IMPLANT
SUT VIC AB 0 CT1 18XCR BRD8 (SUTURE) ×1 IMPLANT
SUT VIC AB 0 CT1 8-18 (SUTURE) ×1
SUT VIC AB 2-0 CP2 18 (SUTURE) ×2 IMPLANT
TOWEL GREEN STERILE (TOWEL DISPOSABLE) ×2 IMPLANT
TOWEL GREEN STERILE FF (TOWEL DISPOSABLE) ×2 IMPLANT
TRAY FOLEY MTR SLVR 16FR STAT (SET/KITS/TRAYS/PACK) IMPLANT
TUBE CONNECTING 20X1/4 (TUBING) ×2 IMPLANT
WATER STERILE IRR 1000ML POUR (IV SOLUTION) ×2 IMPLANT

## 2019-11-17 NOTE — Progress Notes (Signed)
Pt said he does use cpap at home but refused to wear one tonight and was instructed to call if he changed his mind

## 2019-11-17 NOTE — Anesthesia Postprocedure Evaluation (Signed)
Anesthesia Post Note  Patient: [name]  Procedure(s) Performed: SUBOCCIPITAL DECOMPRESSIVE CRANIECTOMY (N/A Head)     Patient location during evaluation: PACU Anesthesia Type: General Level of consciousness: awake Pain management: pain level controlled Vital Signs Assessment: post-procedure vital signs reviewed and stable Respiratory status: spontaneous breathing, nonlabored ventilation, respiratory function stable and patient connected to nasal cannula oxygen Cardiovascular status: blood pressure returned to baseline and stable Postop Assessment: no apparent nausea or vomiting Anesthetic complications: no Comments: Blood pressure improved in PACU   No complications documented.  Last Vitals:  Vitals:   11/17/19 1930 11/17/19 1932  BP:  110/72  Pulse: 87 87  Resp: 12 14  Temp:  37.3 C  SpO2: 96% 96%    Last Pain:  Vitals:   11/17/19 1924  TempSrc:   PainSc: 0-No pain                 Micky Sheller P Fani Rotondo

## 2019-11-17 NOTE — Anesthesia Preprocedure Evaluation (Addendum)
Anesthesia Evaluation  Patient identified by MRN, date of birth, ID band Patient awake    Reviewed: Allergy & Precautions, Patient's Chart, lab work & pertinent test results, Unable to perform ROS - Chart review onlyPreop documentation limited or incomplete due to emergent nature of procedure.  History of Anesthesia Complications Negative for: history of anesthetic complications  Airway Mallampati: III  TM Distance: >3 FB Neck ROM: Full    Dental  (+) Dental Advisory Given   Pulmonary sleep apnea ,    breath sounds clear to auscultation       Cardiovascular hypertension, Pt. on medications  Rhythm:Regular Rate:Tachycardia  1. Left ventricular ejection fraction, by estimation, is 60 to 65%. The  left ventricle has normal function. The left ventricle has no regional  wall motion abnormalities. There is mild asymmetric left ventricular  hypertrophy of the septal segment. Left  ventricular diastolic parameters were normal.  2. Right ventricular systolic function is normal. The right ventricular  size is normal. Tricuspid regurgitation signal is inadequate for assessing  PA pressure.  3. The mitral valve is normal in structure. No evidence of mitral valve  regurgitation. No evidence of mitral stenosis.  4. The aortic valve is tricuspid. Aortic valve regurgitation is not  visualized. No aortic stenosis is present.  5. Aortic dilatation noted. 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 Sinus of valsalva measures 43 mm, aortic arch  appears 40 mm.  6. The inferior vena cava is dilated in size with <50% respiratory  variability, suggesting right atrial pressure of 15 mmHg.    Neuro/Psych PSYCHIATRIC DISORDERS Anxiety Depression  L PICA infarct then progressive somnolence and severe headache. CTH , which shows left cerebellar hemisphere edema with obstruction of CSF flow and resultant ventriculomegaly  consistent with obstructive hydrocephalus.   Neuromuscular disease CVA, Residual Symptoms    GI/Hepatic negative GI ROS, Neg liver ROS,   Endo/Other  Morbid obesity  Renal/GU negative Renal ROS     Musculoskeletal  (+) Arthritis ,   Abdominal   Peds  Hematology   Anesthesia Other Findings   Reproductive/Obstetrics                            Anesthesia Physical Anesthesia Plan  ASA: III and emergent  Anesthesia Plan: General   Post-op Pain Management:    Induction: Intravenous, Rapid sequence and Cricoid pressure planned  PONV Risk Score and Plan: 2 and Ondansetron and Dexamethasone  Airway Management Planned: Oral ETT  Additional Equipment: Arterial line  Intra-op Plan:   Post-operative Plan: Possible Post-op intubation/ventilation  Informed Consent:     Only emergency history available and History available from chart only  Plan Discussed with: CRNA and Surgeon  Anesthesia Plan Comments: (remi gtt)        Anesthesia Quick Evaluation

## 2019-11-17 NOTE — Op Note (Signed)
PATIENT: [name]  DAY OF SURGERY: 11/17/19   PRE-OPERATIVE DIAGNOSIS:  Cerebellar stroke, hydrocephalus   POST-OPERATIVE DIAGNOSIS:  Same   PROCEDURE:  Suboccipital craniectomy   SURGEON:  Surgeon(s) and Role:    [name], MD - Primary   ANESTHESIA: ETGA   BRIEF HISTORY: This is a 49 year old man who presented with a left PICA distribution stroke. He had worsening severe headache and progressive somnolence, CTH showed brainstem compression and obstructive hydrocephalus. I therefore recommended emergent suboccipital decompression with possible placement of external ventricular drain, if needed.   OPERATIVE DETAIL: The patient was taken to the operating room, anesthesia was induced, [place]field head holder was applied, and the patient was placed on the OR table in the  position. A formal time out was performed with two patient identifiers and confirmed the operative site. The operative site was marked, hair was clipped with surgical clippers, the area was then prepped and draped in a sterile fashion.   A midline incision was placed from the inion to C2. Soft tissues were dissected in an avascular plane and retracted. A suboccipital craniectomy was then created with a high speed drill and Kerrison rongeurs extending to and including opening the foramen magnum. The dura was opened sharply with immediate egress of necrotic brain under high pressure. This was evacuated and hemostasis was obtained in the cavity. The durotomy was completed in a standard Y-shape and the brain was pulsatile with good decompression. Hemostasis was confirmed, the wound was copiously irrigated, all instrument and sponge counts were correct, the incision was then closed in layers. The patient was then returned to anesthesia for emergence. No apparent complications at the completion of the procedure.   EBL:    DRAINS: none   SPECIMENS: none   [name], MD 11/17/19 7:35 PM

## 2019-11-17 NOTE — Progress Notes (Signed)
PT Cancellation Note  Patient Details Name: ODIN MARIANI MRN: [id number] DOB: Aug 26, 1970   Cancelled Treatment:    Reason Eval/Treat Not Completed: Patient not medically ready; patient back in room and RN in with patient.  Reports plans for surgery today so will cancel PT and attempt another day.   Elray Mcgregor 11/17/2019, 2:47 PM  Sheran Lawless, PT Acute Rehabilitation Services Pager:[phone] Office:[phone] 11/17/2019

## 2019-11-17 NOTE — Significant Event (Signed)
Rapid Response Event Note  Overview: ICU Transfer - Neurologic- Decreased LOC  Initial Focused Assessment: Called by patient placement who informed me that the patient had orders to transfer to 4N ICU - upon arrival to 5W, bedside RN and [facility] RN informed me that patient has had severe headaches and increased somnolence as the day has progressed. Patient had a CT HEAD that was completed earlier and NSU was consulted - plan to go to the OR for subocciptal hydrocephalus.   Patient was somnolent - he did arouse to my voice, able to follow commands, and moves all extremities equally, but while examining him, he was quick to fall asleep in between questions. Patient was soft spoken - endorsed 4/10 pain - headache. Pupils 3 mm brisk and reactive bilaterally.   VS 147/84(103), HR 79, 98% RA, RR 15   Interventions: -- Transferred patient to the  Pre-Op for surgery then will go to 4N ICU  Plan of Care: -- Transferred patient to the Pre-Op for surgery then will go to 4N ICU  Event Summary:  Call Time 1451 Arrival Time 1453 End Time 1556   Eleshia Wooley R

## 2019-11-17 NOTE — Progress Notes (Signed)
Pt c/o headache rated at 8/10.  He reported that tylenol did not help when he was given in the ED.  Besides, he is NPO and he refused tylenol by rectal route.  MD notified and an order for toradol received.   Will continue to monitor.

## 2019-11-17 NOTE — TOC CAGE-AID Note (Signed)
Transition of Care [facility]) - CAGE-AID Screening   Patient Details  Name: [name] MRN: [id number] Date of Birth: 20-Jun-1970  Transition of Care [facility]) CM/SW Contact:    Jimmy Picket, LCSWA Phone Number: 11/17/2019, 4:41 PM   Clinical Narrative:  CSW attempted multiple times to complete assessment. CSW will attempt again when available.   CAGE-AID Screening: Substance Abuse Screening unable to be completed due to: : Patient unable to participate                  Isabella Stalling Clinical Social Worker [phone]

## 2019-11-17 NOTE — Progress Notes (Signed)
PT Cancellation Note  Patient Details Name: [name] MRN: [id number] DOB: 1971-03-29   Cancelled Treatment:    Reason Eval/Treat Not Completed: Patient at procedure or test/unavailable; patient off the floor for testing.  Will attempt later as time permits.    Elray Mcgregor 11/17/2019, 12:04 PM  Sheran Lawless, PT Acute Rehabilitation Services Pager:[phone] Office:[phone] 11/17/2019

## 2019-11-17 NOTE — Progress Notes (Signed)
Pt transported to and from CT without incident ?

## 2019-11-17 NOTE — Progress Notes (Signed)
Patient complaining of excruciating headache; no relief after Toradol. Family asked to talk with a doctor. MD made aware. Will continue to monitor.

## 2019-11-17 NOTE — Progress Notes (Signed)
TCD bubble and venous duplex have been completed.  Jeb Levering, BS, RDMS, RVT

## 2019-11-17 NOTE — Progress Notes (Signed)
Pt arrived on unit accompanied by his wife.  He is alert and oriented.  Unit and room orientation, vital sign, and bath completed.  Transport arrived to take pt for CT scan.

## 2019-11-17 NOTE — Progress Notes (Addendum)
STROKE TEAM PROGRESS NOTE   INTERVAL HISTORY Patient father and wife at bedside.  As per family, patient had a getting worse early this morning, more on the left frontal and occipital area.  Received morphine and Fioricet.  Neuro stable, no new or worsening.  Will do CT repeat to rule out hydrocephalus.  Vitals:   11/17/19 0015 11/17/19 0225 11/17/19 0400 11/17/19 0751  BP: 113/62 (!) 149/103 (!) 146/103 (!) 147/89  Pulse: 97 90 93 79  Resp: 18 18 16 18   Temp: 98.3 F (36.8 C) 98.5 F (36.9 C) 98.3 F (36.8 C) 98 F (36.7 C)  TempSrc: Axillary Oral Axillary Oral  SpO2: 99% 96% 98% 96%  Weight:      Height:        CBC:  Recent Labs  Lab 11/16/19 0834 11/16/19 0834 11/16/19 1623 11/17/19 0305  WBC 5.3   < > 8.6 10.7*  NEUTROABS 3.1  --   --   --   HGB 15.4   < > 15.7 15.2  HCT 45.6   < > 47.7 44.2  MCV 93.1   < > 96.0 92.9  PLT 219   < > 201 218   < > = values in this interval not displayed.    Basic Metabolic Panel:  Recent Labs  Lab 11/16/19 0834 11/16/19 0834 11/16/19 1623 11/17/19 0305  NA 140  --   --  139  K 3.3*  --   --  3.4*  CL 107  --   --  107  CO2 20*  --   --  22  GLUCOSE 189*  --   --  117*  BUN 11  --   --  9  CREATININE 0.98   < > 1.04 0.81  CALCIUM 8.7*  --   --  9.1  MG  --   --  1.7  --   PHOS  --   --  2.0*  --    < > = values in this interval not displayed.   Lipid Panel:     Component Value Date/Time   CHOL 181 11/16/2019 1623   TRIG 34 11/16/2019 1623   HDL 40 (L) 11/16/2019 1623   CHOLHDL 4.5 11/16/2019 1623   VLDL 7 11/16/2019 1623   LDLCALC 134 (H) 11/16/2019 1623   HgbA1c:  Lab Results  Component Value Date   HGBA1C 5.7 (H) 11/16/2019   Urine Drug Screen:     Component Value Date/Time   LABOPIA NONE DETECTED 11/16/2019 1759   COCAINSCRNUR NONE DETECTED 11/16/2019 1759   LABBENZ NONE DETECTED 11/16/2019 1759   AMPHETMU NONE DETECTED 11/16/2019 1759   THCU POSITIVE (A) 11/16/2019 1759   LABBARB NONE DETECTED  11/16/2019 1759    Alcohol Level     Component Value Date/Time   ETH <10 11/16/2019 1623    IMAGING past 24 hours CT ANGIO HEAD W OR WO CONTRAST  Result Date: 11/16/2019 CLINICAL DATA:  Cerebellar stroke EXAM: CT ANGIOGRAPHY HEAD AND NECK TECHNIQUE: Multidetector CT imaging of the head and neck was performed using the standard protocol during bolus administration of intravenous contrast. Multiplanar CT image reconstructions and MIPs were obtained to evaluate the vascular anatomy. Carotid stenosis measurements (when applicable) are obtained utilizing NASCET criteria, using the distal internal carotid diameter as the denominator. CONTRAST:  01/17/2020 OMNIPAQUE IOHEXOL 350 MG/ML SOLN COMPARISON:  Correlation made with MRI performed earlier same day FINDINGS: CT HEAD Brain: There is hypodensity in the left cerebellum inferiorly reflecting infarction  seen on prior MRI. There is no evidence of hemorrhagic transformation. No hydrocephalus. There is no extra-axial fluid collection. Ventricles and sulci are within normal limits in size and configuration. Vascular: No hyperdense vessel or unexpected calcification. Skull: Calvarium is unremarkable. Sinuses/Orbits: No acute finding. Other: None. Review of the MIP images confirms the above findings CTA NECK Aortic arch: Great vessel origins are patent. Right carotid system: Patent. No measurable stenosis at the ICA origin Left carotid system: Patent. Trace calcified plaque at the ICA origin without measurable stenosis. Vertebral arteries: Patent and codominant. No measurable stenosis or evidence of dissection. Skeleton: Degenerative changes of the cervical spine, greatest at C5-C6 to C7-T1. Other neck: No mass or adenopathy. Upper chest: No apical lung mass. Review of the MIP images confirms the above findings CTA HEAD Anterior circulation: Intracranial internal carotid arteries are patent. Anterior and middle cerebral arteries are patent. Posterior circulation:  Intracranial vertebral arteries patent. Patent bilateral PICA origins. Basilar artery is patent. A small right AICA is noted. Superior cerebellar artery origins are patent. Patent posterior cerebral arteries. Venous sinuses: Patent as allowed by contrast bolus timing. Review of the MIP images confirms the above findings IMPRESSION: Evolving acute left cerebellar PICA territory infarction. No hydrocephalus or hemorrhage. No large vessel occlusion or hemodynamically significant stenosis. Left PICA origin is patent Electronically Signed   By: Guadlupe Spanish M.D.   On: 11/16/2019 21:17   DG Chest 2 View  Result Date: 11/16/2019 CLINICAL DATA:  Dizziness and lethargy. EXAM: CHEST - 2 VIEW COMPARISON:  Earlier portable chest x-ray, same date. FINDINGS: Borderline cardiac enlargement with left ventricular configuration. Mild tortuosity of the thoracic aorta. The mediastinal and hilar contours are unremarkable. The lungs are clear. No pleural effusion. The bony thorax is intact. IMPRESSION: No acute cardiopulmonary findings. The slightly prominent mediastinal contour appear to be due to mild tortuosity of the thoracic aorta. Electronically Signed   By: Rudie Meyer M.D.   On: 11/16/2019 10:10   CT ANGIO NECK W OR WO CONTRAST  Result Date: 11/16/2019 CLINICAL DATA:  Cerebellar stroke EXAM: CT ANGIOGRAPHY HEAD AND NECK TECHNIQUE: Multidetector CT imaging of the head and neck was performed using the standard protocol during bolus administration of intravenous contrast. Multiplanar CT image reconstructions and MIPs were obtained to evaluate the vascular anatomy. Carotid stenosis measurements (when applicable) are obtained utilizing NASCET criteria, using the distal internal carotid diameter as the denominator. CONTRAST:  OMNIPAQUE IOHEXOL 350 MG/ML SOLN COMPARISON:  Correlation made with MRI performed earlier same day FINDINGS: CT HEAD Brain: There is hypodensity in the left cerebellum inferiorly reflecting  infarction seen on prior MRI. There is no evidence of hemorrhagic transformation. No hydrocephalus. There is no extra-axial fluid collection. Ventricles and sulci are within normal limits in size and configuration. Vascular: No hyperdense vessel or unexpected calcification. Skull: Calvarium is unremarkable. Sinuses/Orbits: No acute finding. Other: None. Review of the MIP images confirms the above findings CTA NECK Aortic arch: Great vessel origins are patent. Right carotid system: Patent. No measurable stenosis at the ICA origin Left carotid system: Patent. Trace calcified plaque at the ICA origin without measurable stenosis. Vertebral arteries: Patent and codominant. No measurable stenosis or evidence of dissection. Skeleton: Degenerative changes of the cervical spine, greatest at C5-C6 to C7-T1. Other neck: No mass or adenopathy. Upper chest: No apical lung mass. Review of the MIP images confirms the above findings CTA HEAD Anterior circulation: Intracranial internal carotid arteries are patent. Anterior and middle cerebral arteries are patent. Posterior  circulation: Intracranial vertebral arteries patent. Patent bilateral PICA origins. Basilar artery is patent. A small right AICA is noted. Superior cerebellar artery origins are patent. Patent posterior cerebral arteries. Venous sinuses: Patent as allowed by contrast bolus timing. Review of the MIP images confirms the above findings IMPRESSION: Evolving acute left cerebellar PICA territory infarction. No hydrocephalus or hemorrhage. No large vessel occlusion or hemodynamically significant stenosis. Left PICA origin is patent Electronically Signed   By: Guadlupe Spanish M.D.   On: 11/16/2019 21:17   MR BRAIN WO CONTRAST  Addendum Date: 11/16/2019   ADDENDUM REPORT: 11/16/2019 16:35 ADDENDUM: These results were called by telephone at the time of interpretation on 11/16/2019 at 4:34 pm to provider Dr. Jacqulyn Bath, who verbally acknowledged these results. Electronically  Signed   By: Jackey Loge DO   On: 11/16/2019 16:35   Result Date: 11/16/2019 CLINICAL DATA:  Persistent vertigo; history of TIA, lupus; vertigo, central. EXAM: MRI HEAD WITHOUT CONTRAST TECHNIQUE: Multiplanar, multiecho pulse sequences of the brain and surrounding structures were obtained without intravenous contrast. COMPARISON:  No pertinent prior studies available for comparison. FINDINGS: Brain: There is a focus of restricted diffusion within the left cerebellar hemisphere measuring 4.1 x 3.3 cm in transaxial dimensions consistent with acute left PICA vascular territory infarct. Corresponding T2/FLAIR hyperintensity at this site. An additional punctate acute infarct versus artifact is questioned within the left occipital lobe (series 5, image 75). There is no significant white matter disease. No evidence of intracranial mass. No chronic intracranial blood products. No extra-axial fluid collection. No midline shift. Vascular: Expected proximal arterial flow voids. However, the left PICA is poorly assessed due to small vessel size. Skull and upper cervical spine: No focal marrow lesion. Sinuses/Orbits: Visualized orbits show no acute finding. Mild ethmoid sinus mucosal thickening. Small bilateral maxillary sinus mucous retention cysts. No significant mastoid effusion IMPRESSION: 4.1 cm acute left PICA territory ischemic infarct within the left cerebellar hemisphere. There is no significant posterior fossa mass effect at this time. An additional punctate acute infarct is questioned within the left occipital lobe. Otherwise unremarkable MRI appearance of the brain. Mild ethmoid sinus mucosal thickening. Small bilateral maxillary sinus mucous retention cysts. Electronically Signed: By: Jackey Loge DO On: 11/16/2019 16:15    PHYSICAL EXAM  Temp:  [97.8 F (36.6 C)-98.5 F (36.9 C)] 98.4 F (36.9 C) (07/02 0910) Pulse Rate:  [58-106] 63 (07/02 1107) Resp:  [7-23] 13 (07/02 1107) BP: (113-157)/(62-113)  149/113 (07/02 1107) SpO2:  [96 %-100 %] 98 % (07/02 1107) Weight:  [144 kg] 144 kg (07/01 2018)  General - Well nourished, well developed, in acute distress of headache.  Ophthalmologic - fundi not visualized due to noncooperation.  Cardiovascular - Regular rhythm and rate.  Mental Status -  Level of arousal and orientation to time, place, and person were intact. Language including expression, naming, repetition, comprehension was assessed and found intact. Fund of Knowledge was assessed and was intact.  Cranial Nerves II - XII - II - Visual field intact OU. III, IV, VI - Extraocular movements intact. V - Facial sensation intact bilaterally. VII - Facial movement intact bilaterally. VIII - mild end gaze nystagmus on right gaze. X - Palate elevates symmetrically. XI - Chin turning & shoulder shrug intact bilaterally. XII - Tongue protrusion intact.  Motor Strength - The patient's strength was normal in all extremities and pronator drift was absent except chronic right foot drop.  Bulk was normal and fasciculations were absent.   Motor Tone - Muscle  tone was assessed at the neck and appendages and was normal.  Reflexes - The patient's reflexes were symmetrical in all extremities and he had no pathological reflexes.  Sensory - Light touch, temperature/pinprick were assessed and were symmetrical.    Coordination - The patient had normal movements in the right hand and feet with no ataxia or dysmetria.  Mild left dysmetria on finger-to-nose.  Tremor was absent.  Gait and Station - deferred.   ASSESSMENT/PLAN [name] is a 49 y.o. male with history of B carpal tunnel syndrome, hypertension, sleep apnea with intermittent CPAP usage, degenerative disc disease of the lumbar spine presenting with dizziness, ataxic gait along with CP.   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 Punctate  L occipital infarct. Sinus dz.   CTA head & neck evolving L PICA infarct. No LVO  LE Doppler no DVT  2D Echo EF 60 to 65%  TCD Doppler w/ bubble positive for PFO at rest, not able to perform valsalva this time  TEE scheduled for 11/21/2019 at 1130 for stroke work up  LDL 134  HgbA1c 5.7  Hypercoagulable and autoimmune labs pending   lovenox for VTE prophylaxis  No antithrombotic prior to admission, now on aspirin 81 mg daily and plavix 75 DAPT for 3 weeks and then ASA alone.  Therapy recommendations:  pending   Disposition:  pending   HA  Has been going on days  Worsening this am  Will repeat CT stat to rule out hydrocephalus  fioricet and tylenoal PRN  Pt educated on coping with stress and lower anxiety level  PFO  Positive TCD bubble study at rest - spencer degree III  Not able to do valsalva due to elevated ICP and not cooperative due to sleepiness  Could be the cause of current stroke  Will need outpt follow up with Dr. Excell Seltzer to consider PFO closure as outpt  Hypertension  Stable . Permissive hypertension (OK if < 220/120) but gradually normalize in 2-3 days . Long-term BP goal normotensive  Hyperlipidemia  Home meds:  lipitor 10  Now on lipitor 80  LDL 134, goal < 70  Continue statin at discharge  Other Stroke Risk Factors  ETOH use, alcohol level <10, advised to drink no more than 2 drink(s) a day  Substance abuse - UDS:  THC POSITIVE. Patient advised to stop using due to stroke risk.  Obesity, Body mass index is 39.68 kg/m., recommend weight loss, diet and exercise as appropriate   Family hx stroke (father). Mother died of MI in her early 64s.   Obstructive sleep apnea, does not use CPAP at home  Other Active Problems  CP, resolved  Hypokalemia 3.4   Depression/anxiety on lexapro and klonopin  Lumbar degenerative disc disease, stable  Leukocytosis 10.7  Lactic acidosis 5.2->2.0  Hospital day # 1  Patient condition  critical, has developed worsening HA, left PICA stroke with risk of hydrocephalus, and I ordered stat CT, counseling pt and wife and agree with supportive management. I discussed with Dr. Randol Kern. I spent  35 minutes in total face-to-face time with the patient, more than 50% of which was spent in counseling and coordination of care, reviewing test results, images and medication, and discussing the diagnosis, treatment plan and potential prognosis. This patient's care requiresreview of multiple databases, neurological assessment, discussion with family, other specialists and medical decision making of high complexity. I  had long discussion with wife at bedside, updated pt current condition, treatment plan and potential prognosis, and answered all the questions. She expressed understanding and appreciation.   Marvel PlanJindong Ilisha Blust, MD PhD Stroke Neurology 11/17/2019 12:13 PM    To contact Stroke Continuity provider, please refer to [email or web address]. After hours, contact General Neurology

## 2019-11-17 NOTE — Progress Notes (Signed)
Patient verbalized that his headache got worse in the last 30 minutes; continue to complain of nausea. VS stable. MD made aware. Will continue to monitor.

## 2019-11-17 NOTE — Progress Notes (Signed)
OT Cancellation Note  Patient Details Name: [name] MRN: [id number] DOB: Apr 28, 1971   Cancelled Treatment:    Reason Eval/Treat Not Completed: Patient not medically ready.  Patient in room with RN and family.  Unable to sit up without becoming dizzy/nauseous.  Patient and RN requesting hold until more stable.  Will follow up later today or tomorrow depending on progress.  Barbie Banner, OTR/L    Adella Hare 11/17/2019, 9:27 AM

## 2019-11-17 NOTE — Progress Notes (Signed)
Pt complained of headache 9/10.  Too early to administer toradol. He is NPO and refused rectal tylenol.  MD text paged.  Waiting for response.  Will continue to monitor.

## 2019-11-17 NOTE — Brief Op Note (Signed)
11/17/2019  7:30 PM  PATIENT:  Manuela Schwartz  49 y.o. male  PRE-OPERATIVE DIAGNOSIS:  Cerebellar stroke, hydrocephalus  POST-OPERATIVE DIAGNOSIS:  Same  PROCEDURE:  Procedure(s): SUBOCCIPITAL DECOMPRESSIVE CRANIECTOMY (N/A)  SURGEON:  Surgeon(s) and Role:    * [name], MD - Primary  PHYSICIAN ASSISTANT:   ASSISTANTS: none   ANESTHESIA:   general  EBL:  150 mL   BLOOD ADMINISTERED:none  DRAINS: none   LOCAL MEDICATIONS USED:  LIDOCAINE   SPECIMEN:  No Specimen  DISPOSITION OF SPECIMEN:  N/A  COUNTS:  YES  TOURNIQUET:  * No tourniquets in log *  DICTATION: .Note written in EPIC  PLAN OF CARE: Admit to inpatient   PATIENT DISPOSITION:  PACU - hemodynamically stable.   Delay start of Pharmacological VTE agent (>24hrs) due to surgical blood loss or risk of bleeding: yes

## 2019-11-17 NOTE — Progress Notes (Signed)
Speech Language Pathology Discharge Patient Details Name: [name] MRN: [id number] DOB: Apr 07, 1971 Today's Date: 11/17/2019 Time:  -     Patient discharged from SLP services secondary to medical decline - will need to re-order SLP to resume therapy services. Emergently taken to OR due to hydrocephalus. Reorder swallow when appropriate.  Please see latest therapy progress note for current level of functioning and progress toward goals.    Progress and discharge plan discussed with patient and/or caregiver: Patient unable to participate in discharge planning and no caregivers available  GO     Royce Macadamia 11/17/2019, 3:51 PM   Breck Coons Lonell Face.Ed Nurse, children's [phone] Office [phone]

## 2019-11-17 NOTE — Progress Notes (Signed)
Echocardiogram 2D Echocardiogram has been performed.  [name] [name] Jim 11/17/2019, 10:05 AM

## 2019-11-17 NOTE — Transfer of Care (Signed)
Immediate Anesthesia Transfer of Care Note  Patient: [name]  Procedure(s) Performed: SUBOCCIPITAL DECOMPRESSIVE CRANIECTOMY (N/A Head)  Patient Location: PACU  Anesthesia Type:General  Level of Consciousness: awake and alert   Airway & Oxygen Therapy: Patient Spontanous Breathing  Post-op Assessment: Report given to RN and Post -op Vital signs reviewed and stable  Post vital signs: Reviewed and stable  Last Vitals:  Vitals Value Taken Time  BP 150/95 11/17/19 1902  Temp 37.7 C 11/17/19 1855  Pulse 101 11/17/19 1902  Resp 19 11/17/19 1902  SpO2 93 % 11/17/19 1902  Vitals shown include unvalidated device data.  Last Pain:  Vitals:   11/17/19 1510  TempSrc: Axillary  PainSc: 8       Patients Stated Pain Goal: 4 (11/17/19 1510)  Complications: No complications documented.

## 2019-11-17 NOTE — Anesthesia Procedure Notes (Signed)
Procedure Name: Intubation Date/Time: 11/17/2019 4:27 PM Performed by: Eligha Bridegroom, CRNA Pre-anesthesia Checklist: Patient identified, Emergency Drugs available, Suction available, Patient being monitored and Timeout performed Patient Re-evaluated:Patient Re-evaluated prior to induction Oxygen Delivery Method: Circle system utilized Preoxygenation: Pre-oxygenation with 100% oxygen Induction Type: IV induction and Rapid sequence Laryngoscope Size: Mac and 4 Grade View: Grade II Tube type: Oral Tube size: 8.0 mm Number of attempts: 1 Airway Equipment and Method: Stylet Placement Confirmation: ETT inserted through vocal cords under direct vision,  positive ETCO2 and breath sounds checked- equal and bilateral Secured at: 22 cm Tube secured with: Tape Dental Injury: Teeth and Oropharynx as per pre-operative assessment

## 2019-11-17 NOTE — Progress Notes (Addendum)
Pt was seen again for TCD bubble study. As per wife, pt is sleepy after the medication (morphine and fioricet) did not complain of HA at this time. However, when pt briefly woke up, he seems still in pain.   CT repeat showed developing hydrocephalus. I discussed with Dr. Marcia Brash and he agreed with emergency EVD and subocciptal decompression. I also discussed with wife that Dr. Marcia Brash will talk to her to get consent.  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 Not able to do valsalva due to noncooperative and pt has increased ICP. Will schedule TEE next Tuesday if able to do.   Pt will transfer to OR and then will admit to 4N. Stroke team will take over while pt at 4N, will transfer back to hospitalist service when transfer out of ICU.   Hold plavix for surgery. Once hemodynamically stable post surgery, plavix can be restarted.  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 This patient's care requires constant monitoring of vital signs, hemodynamics, respiratory and cardiac monitoring, review of multiple databases, neurological assessment, discussion with family, other specialists and medical decision making of high complexity. I spent total 50 minutes of neurocritical care time in the care of this patient. I had long discussion with wife at bedside, updated pt current condition, treatment plan and potential prognosis, and answered all the questions. She expressed understanding and appreciation. I also discussed with Dr. Marcia Brash and Dr. Jason Nest.

## 2019-11-17 NOTE — Progress Notes (Signed)
Patient went to OR.

## 2019-11-17 NOTE — Progress Notes (Signed)
PROGRESS NOTE                                                                                                                                                                                                             Patient Demographics:    [name], is a 49 y.o. male, DOB - 1971-03-25, ZOX:[id number]  Admit date - 11/16/2019   Admitting Physician Ollen Bowl, MD  Outpatient Primary MD for the patient is Sigmund Hazel, MD  LOS - 1   Chief Complaint  Patient presents with  . Chest Pain       Brief Narrative    49 y.o. male with medical history significant of hypertension, hyperlipidemia, depression/anxiety, degenerative disc disease in lumbar region, obesity presents to emergency department for the evaluation of dizziness.  As well he did develop nausea, vomiting and diaphoresis, in ED he was noted to be tachycardic, tachypneic and hypothermic, with no leukocytosis, MRI brain did show acute CVA, neurology were consulted, given his left PICA stroke, patient continues to have severe headache, nausea and vomiting, so stat repeat CT head was obtained, did show developing hydrocephalus, neurosurgery Dr. Marcia Brash has been consulted, and plan to go to the OR emergently for decompression.   Subjective:    [name] today complains of severe headache, with some improvement with Dilaudid and Fioricet, he had few episodes of nausea and vomiting as well.    Assessment  & Plan :    Principal Problem:   Ischemic stroke (HCC) Active Problems:   DDD (degenerative disc disease), lumbar   Hypertension   Intractable nausea and vomiting   Hypokalemia   Depression with anxiety  Ischemic CVA -With left PICA territory infarct, embolic, secondary to unknown source, MRI  L PCA infarct L cerebellar hemisphere w/o mass effect. Punctate L occipital infarct.  CTA head and neck with no LVO, TC Doppler with bubble positive for PFO at rest, denies Doppler with no  evidence of DVT. -Patient on dual antiplatelet therapy aspirin and Plavix.  Obstructive hydrocephalus -[place] to left PICA infarct, with left cerebral hemisphere edema, with obstruction of CSF flow and resultant ventriculomegaly. -Neurosurgery input greatly appreciated, patient OR emergently for suboccipital decompression.  PFO -We will need outpatient follow-up with cardiology to consider PFO closure as an outpatient.  Hypertension -Allow for permissive hypertension.  Hyperlipidemia -LDL  at 134, continue with statin    COVID-19 Labs  No results for input(s): DDIMER, FERRITIN, LDH, CRP in the last 72 hours.  Lab Results  Component Value Date   SARSCOV2NAA NEGATIVE 11/16/2019     Code Status : Full  Family Communication  : Discussed with wife multiple times at bedside today  Disposition Plan  :  Status is: Inpatient  Remains inpatient appropriate because:Inpatient level of care appropriate due to severity of illness   Dispo: The patient is from: Home              Anticipated d/c is to: Home              Anticipated d/c date is: > 3 days              Patient currently is not medically stable to d/c.   I have discussed with neurology, patient will be transferred to neuro ICU, under stroke team care team as primary.   Consults  :  Neurology,Neurosurgery  Procedures  : None  DVT Prophylaxis  :  [place] lovenox  Lab Results  Component Value Date   PLT 218 11/17/2019    Antibiotics  :    Anti-infectives (From admission, onward)   None        Objective:   Vitals:   11/17/19 1200 11/17/19 1300 11/17/19 1400 11/17/19 1510  BP: (!) 147/93 139/74 128/76 (!) 155/91  Pulse: 81 67 64 64  Resp: Temp: 97.9 F (36.6 C) 98.3 F (36.8 C) 97.9 F (36.6 C) (!) 97.5 F (36.4 C)  TempSrc: Oral Oral Axillary Axillary  SpO2: 96% 96% 96% 95%  Weight:      Height:        Wt Readings from Last 3 Encounters:  11/16/19 (!) 144 kg  07/31/19 (!) 144.1 kg   07/05/19 (!) 145.2 kg     Intake/Output Summary (Last 24 hours) at 11/17/2019 1532 Last data filed at 11/17/2019 1257 Gross per 24 hour  Intake 469.91 ml  Output 1800 ml  Net -1330.09 ml     Physical Exam  Patient wakes up, answer questions,, significantly uncomfortable secondary to headache, and nausea and vertigo. Symmetrical Chest wall movement, Good air movement bilaterally, CTAB RRR,No Gallops,Rubs or new Murmurs, No Parasternal Heave +ve B.Sounds, Abd Soft, No tenderness,No rebound - guarding or rigidity. No Cyanosis, Clubbing or edema, No new Rash or bruise      Data Review:    CBC Recent Labs  Lab 11/16/19 0834 11/16/19 1623 11/17/19 0305  WBC 5.3 8.6 10.7*  HGB 15.4 15.7 15.2  HCT 45.6 47.7 44.2  PLT 219 201 218  MCV 93.1 96.0 92.9  MCH 31.4 31.6 31.9  MCHC 33.8 32.9 34.4  RDW 12.1 12.3 12.3  LYMPHSABS 1.5  --   --   MONOABS 0.5  --   --   EOSABS 0.1  --   --   BASOSABS 0.0  --   --     Chemistries  Recent Labs  Lab 11/16/19 0834 11/16/19 1623 11/17/19 0305  NA 140  --  139  K 3.3*  --  3.4*  CL 107  --  107  CO2 20*  --  22  GLUCOSE 189*  --  117*  BUN 11  --  9  CREATININE 0.98 1.04 0.81  CALCIUM 8.7*  --  9.1  MG  --  1.7  --   AST 34  --  23  ALT 48*  --  38  ALKPHOS 68  --  69  BILITOT 0.8  --  0.9   ------------------------------------------------------------------------------------------------------------------ Recent Labs    11/16/19 1623  CHOL 181  HDL 40*  LDLCALC 134*  TRIG 34  CHOLHDL 4.5    Lab Results  Component Value Date   HGBA1C 5.7 (H) 11/16/2019   ------------------------------------------------------------------------------------------------------------------ Recent Labs    11/16/19 0834  TSH 1.525   ------------------------------------------------------------------------------------------------------------------ No results for input(s): VITAMINB12, FOLATE, FERRITIN, TIBC, IRON, RETICCTPCT in the last 72  hours.  Coagulation profile No results for input(s): INR, PROTIME in the last 168 hours.  No results for input(s): DDIMER in the last 72 hours.  Cardiac Enzymes No results for input(s): CKMB, TROPONINI, MYOGLOBIN in the last 168 hours.  Invalid input(s): CK ------------------------------------------------------------------------------------------------------------------ No results found for: BNP  Inpatient Medications  Scheduled Meds: .  stroke: mapping our early stages of recovery book   Does not apply Once  . aspirin EC  81 mg Oral Daily  . atorvastatin  80 mg Oral Daily  . clopidogrel  75 mg Oral Daily  . enoxaparin (LOVENOX) injection  40 mg Subcutaneous Q24H  . escitalopram  10 mg Oral Daily  . ketorolac  30 mg Intravenous Once  . mupirocin ointment  1 application Nasal BID   Continuous Infusions: . 0.9 % NaCl with KCl 20 mEq / L 75 mL/hr at 11/17/19 0821   PRN Meds:.acetaminophen **OR** acetaminophen, butalbital-acetaminophen-caffeine, clonazePAM, ketorolac, meclizine, ondansetron **OR** ondansetron (ZOFRAN) IV, promethazine  Micro Results Recent Results (from the past 240 hour(s))  SARS Coronavirus 2 by RT PCR (hospital order, performed in [facility] hospital lab) Nasopharyngeal Nasopharyngeal Swab     Status: None   Collection Time: 11/16/19  5:10 PM   Specimen: Nasopharyngeal Swab  Result Value Ref Range Status   SARS Coronavirus 2 NEGATIVE NEGATIVE Final    Comment: (NOTE) SARS-CoV-2 target nucleic acids are NOT DETECTED.  The SARS-CoV-2 RNA is generally detectable in upper and lower respiratory specimens during the acute phase of infection. The lowest concentration of SARS-CoV-2 viral copies this assay can detect is 250 copies / mL. A negative result does not preclude SARS-CoV-2 infection and should not be used as the sole basis for treatment or other patient management decisions.  A negative result may occur with improper specimen collection / handling,  submission of specimen other than nasopharyngeal swab, presence of viral mutation(s) within the areas targeted by this assay, and inadequate number of viral copies (<250 copies / mL). A negative result must be combined with clinical observations, patient history, and epidemiological information.  Fact Sheet for Patients:   BoilerBrush.com.cy  Fact Sheet for Healthcare Providers: https://pope.com/  This test is not yet approved or  cleared by the [place] FDA and has been authorized for detection and/or diagnosis of SARS-CoV-2 by FDA under an Emergency Use Authorization (EUA).  This EUA will remain in effect (meaning this test can be used) for the duration of the COVID-19 declaration under Section 564(b)(1) of the Act, 21 U.S.C. section 360bbb-3(b)(1), unless the authorization is terminated or revoked sooner.  Performed at [facility], [address] [place], [place] [address]     Radiology Reports CT ANGIO HEAD W OR WO CONTRAST  Result Date: 11/16/2019 CLINICAL DATA:  Cerebellar stroke EXAM: CT ANGIOGRAPHY HEAD AND NECK TECHNIQUE: Multidetector CT imaging of the head and neck was performed using the standard protocol during bolus administration of intravenous contrast. Multiplanar CT image reconstructions and  MIPs were obtained to evaluate the vascular anatomy. Carotid stenosis measurements (when applicable) are obtained utilizing NASCET criteria, using the distal internal carotid diameter as the denominator. CONTRAST:  OMNIPAQUE IOHEXOL 350 MG/ML SOLN COMPARISON:  Correlation made with MRI performed earlier same day FINDINGS: CT HEAD Brain: There is hypodensity in the left cerebellum inferiorly reflecting infarction seen on prior MRI. There is no evidence of hemorrhagic transformation. No hydrocephalus. There is no extra-axial fluid collection. Ventricles and sulci are within normal limits in size and configuration.  Vascular: No hyperdense vessel or unexpected calcification. Skull: Calvarium is unremarkable. Sinuses/Orbits: No acute finding. Other: None. Review of the MIP images confirms the above findings CTA NECK Aortic arch: Great vessel origins are patent. Right carotid system: Patent. No measurable stenosis at the ICA origin Left carotid system: Patent. Trace calcified plaque at the ICA origin without measurable stenosis. Vertebral arteries: Patent and codominant. No measurable stenosis or evidence of dissection. Skeleton: Degenerative changes of the cervical spine, greatest at C5-C6 to C7-T1. Other neck: No mass or adenopathy. Upper chest: No apical lung mass. Review of the MIP images confirms the above findings CTA HEAD Anterior circulation: Intracranial internal carotid arteries are patent. Anterior and middle cerebral arteries are patent. Posterior circulation: Intracranial vertebral arteries patent. Patent bilateral PICA origins. Basilar artery is patent. A small right AICA is noted. Superior cerebellar artery origins are patent. Patent posterior cerebral arteries. Venous sinuses: Patent as allowed by contrast bolus timing. Review of the MIP images confirms the above findings IMPRESSION: Evolving acute left cerebellar PICA territory infarction. No hydrocephalus or hemorrhage. No large vessel occlusion or hemodynamically significant stenosis. Left PICA origin is patent Electronically Signed   By: Guadlupe Spanish M.D.   On: 11/16/2019 21:17   DG Chest 2 View  Result Date: 11/16/2019 CLINICAL DATA:  Dizziness and lethargy. EXAM: CHEST - 2 VIEW COMPARISON:  Earlier portable chest x-ray, same date. FINDINGS: Borderline cardiac enlargement with left ventricular configuration. Mild tortuosity of the thoracic aorta. The mediastinal and hilar contours are unremarkable. The lungs are clear. No pleural effusion. The bony thorax is intact. IMPRESSION: No acute cardiopulmonary findings. The slightly prominent mediastinal contour  appear to be due to mild tortuosity of the thoracic aorta. Electronically Signed   By: Rudie Meyer M.D.   On: 11/16/2019 10:10   CT HEAD WO CONTRAST  Result Date: 11/17/2019 CLINICAL DATA:  Follow-up stroke EXAM: CT HEAD WITHOUT CONTRAST TECHNIQUE: Contiguous axial images were obtained from the base of the skull through the vertex without intravenous contrast. COMPARISON:  11/16/2019 FINDINGS: Brain: There again noted changes consistent with the known left cerebellar infarct. No findings to suggest acute hemorrhage, new infarct or space-occupying mass lesion are seen. Vascular: No hyperdense vessel or unexpected calcification. Skull: Normal. Negative for fracture or focal lesion. Sinuses/Orbits: No acute finding. Other: None. IMPRESSION: Left cerebellar infarct relatively stable from the prior exam. No new focal abnormality is seen. Electronically Signed   By: Alcide Clever M.D.   On: 11/17/2019 12:31   CT ANGIO NECK W OR WO CONTRAST  Result Date: 11/16/2019 CLINICAL DATA:  Cerebellar stroke EXAM: CT ANGIOGRAPHY HEAD AND NECK TECHNIQUE: Multidetector CT imaging of the head and neck was performed using the standard protocol during bolus administration of intravenous contrast. Multiplanar CT image reconstructions and MIPs were obtained to evaluate the vascular anatomy. Carotid stenosis measurements (when applicable) are obtained utilizing NASCET criteria, using the distal internal carotid diameter as the denominator. CONTRAST:  OMNIPAQUE IOHEXOL 350 MG/ML  SOLN COMPARISON:  Correlation made with MRI performed earlier same day FINDINGS: CT HEAD Brain: There is hypodensity in the left cerebellum inferiorly reflecting infarction seen on prior MRI. There is no evidence of hemorrhagic transformation. No hydrocephalus. There is no extra-axial fluid collection. Ventricles and sulci are within normal limits in size and configuration. Vascular: No hyperdense vessel or unexpected calcification. Skull: Calvarium is  unremarkable. Sinuses/Orbits: No acute finding. Other: None. Review of the MIP images confirms the above findings CTA NECK Aortic arch: Great vessel origins are patent. Right carotid system: Patent. No measurable stenosis at the ICA origin Left carotid system: Patent. Trace calcified plaque at the ICA origin without measurable stenosis. Vertebral arteries: Patent and codominant. No measurable stenosis or evidence of dissection. Skeleton: Degenerative changes of the cervical spine, greatest at C5-C6 to C7-T1. Other neck: No mass or adenopathy. Upper chest: No apical lung mass. Review of the MIP images confirms the above findings CTA HEAD Anterior circulation: Intracranial internal carotid arteries are patent. Anterior and middle cerebral arteries are patent. Posterior circulation: Intracranial vertebral arteries patent. Patent bilateral PICA origins. Basilar artery is patent. A small right AICA is noted. Superior cerebellar artery origins are patent. Patent posterior cerebral arteries. Venous sinuses: Patent as allowed by contrast bolus timing. Review of the MIP images confirms the above findings IMPRESSION: Evolving acute left cerebellar PICA territory infarction. No hydrocephalus or hemorrhage. No large vessel occlusion or hemodynamically significant stenosis. Left PICA origin is patent Electronically Signed   By: Guadlupe Spanish M.D.   On: 11/16/2019 21:17   MR BRAIN WO CONTRAST  Addendum Date: 11/16/2019   ADDENDUM REPORT: 11/16/2019 16:35 ADDENDUM: These results were called by telephone at the time of interpretation on 11/16/2019 at 4:34 pm to provider Dr. Jacqulyn Bath, who verbally acknowledged these results. Electronically Signed   By: Jackey Loge DO   On: 11/16/2019 16:35   Result Date: 11/16/2019 CLINICAL DATA:  Persistent vertigo; history of TIA, lupus; vertigo, central. EXAM: MRI HEAD WITHOUT CONTRAST TECHNIQUE: Multiplanar, multiecho pulse sequences of the brain and surrounding structures were obtained  without intravenous contrast. COMPARISON:  No pertinent prior studies available for comparison. FINDINGS: Brain: There is a focus of restricted diffusion within the left cerebellar hemisphere measuring 4.1 x 3.3 cm in transaxial dimensions consistent with acute left PICA vascular territory infarct. Corresponding T2/FLAIR hyperintensity at this site. An additional punctate acute infarct versus artifact is questioned within the left occipital lobe (series 5, image 75). There is no significant white matter disease. No evidence of intracranial mass. No chronic intracranial blood products. No extra-axial fluid collection. No midline shift. Vascular: Expected proximal arterial flow voids. However, the left PICA is poorly assessed due to small vessel size. Skull and upper cervical spine: No focal marrow lesion. Sinuses/Orbits: Visualized orbits show no acute finding. Mild ethmoid sinus mucosal thickening. Small bilateral maxillary sinus mucous retention cysts. No significant mastoid effusion IMPRESSION: 4.1 cm acute left PICA territory ischemic infarct within the left cerebellar hemisphere. There is no significant posterior fossa mass effect at this time. An additional punctate acute infarct is questioned within the left occipital lobe. Otherwise unremarkable MRI appearance of the brain. Mild ethmoid sinus mucosal thickening. Small bilateral maxillary sinus mucous retention cysts. Electronically Signed: By: Jackey Loge DO On: 11/16/2019 16:15   DG Chest Portable 1 View  Result Date: 11/16/2019 CLINICAL DATA:  Chest pain. EXAM: PORTABLE CHEST 1 VIEW COMPARISON:  02/14/2007. FINDINGS: Mild prominence of the upper mediastinum noted. This may be secondary to AP  apical lordotic portable technique. Upright PA and lateral chest x-ray suggested to exclude mediastinal widening. Borderline cardiomegaly also possibly related to AP technique. Low lung volumes with basilar atelectasis. No pleural effusion or pneumothorax. No  displaced rib fracture identified. IMPRESSION: 1. Mild prominence of the upper mediastinum noted. This may be secondary to AP apical lordotic portable technique. Upright PA and lateral chest x-ray suggested to exclude mediastinal widening. Borderline cardiomegaly could also possibly related to AP technique. 2.  Low lung volumes with bibasilar atelectasis. This report was phoned to [facility] in the emergency room at 8:54 a.m. on 11/16/2019. Electronically Signed   By: Maisie Fus  Register   On: 11/16/2019 08:55   VAS [place] TRANSCRANIAL DOPPLER W BUBBLES  Result Date: 11/17/2019  Transcranial Doppler with Bubble Indications: Stroke. Performing Technologist: Jeb Levering RDMS, RVT  Examination Guidelines: A complete evaluation includes B-mode imaging, spectral Doppler, color Doppler, and power Doppler as needed of all accessible portions of each vessel. Bilateral testing is considered an integral part of a complete examination. Limited examinations for reoccurring indications may be performed as noted.  Summary:  A vascular evaluation was performed. The right middle cerebral artery was studied. An IV was inserted into the patient's left AC fossa. Verbal informed consent was obtained.  Mild to moderate HITS heard at rest. Unable to perform Valsalva due to patient condition. PFO size: Spencer degree III *See table(s) above for TCD measurements and observations.    Preliminary    ECHOCARDIOGRAM COMPLETE  Result Date: 11/17/2019    ECHOCARDIOGRAM REPORT   Patient Name:   [name] Date of Exam: 11/17/2019 Medical Rec #:  [id number]      Height:       75.0 in Accession #:    [id number]     Weight:       317.5 lb Date of Birth:  1970-10-06      BSA:          2.671 m Patient Age:    48 years       BP:           156/89 mmHg Patient Gender: M              HR:           80 bpm. Exam Location:  Inpatient Procedure: 2D Echo, Color Doppler and Cardiac Doppler Indications:    Stroke i163.9  History:        Patient has no  prior history of Echocardiogram examinations.                 Risk Factors:Hypertension and Sleep Apnea.  Sonographer:    Irving Burton Senior RDCS Referring Phys: [id number] Ollen Bowl  Sonographer Comments: Unable to turn due to extreme nausea. IMPRESSIONS  1. Left ventricular ejection fraction, by estimation, is 60 to 65%. The left ventricle has normal function. The left ventricle has no regional wall motion abnormalities. There is mild asymmetric left ventricular hypertrophy of the septal segment. Left ventricular diastolic parameters were normal.  2. Right ventricular systolic function is normal. The right ventricular size is normal. Tricuspid regurgitation signal is inadequate for assessing PA pressure.  3. The mitral valve is normal in structure. No evidence of mitral valve regurgitation. No evidence of mitral stenosis.  4. The aortic valve is tricuspid. Aortic valve regurgitation is not visualized. No aortic stenosis is present.  5. Aortic dilatation noted. 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 Sinus of valsalva measures  43 mm, aortic arch appears 40 mm.  6. The inferior vena cava is dilated in size with <50% respiratory variability, suggesting right atrial pressure of 15 mmHg. Conclusion(s)/Recommendation(s): No intracardiac source of embolism detected on this transthoracic study. A transesophageal echocardiogram is recommended to exclude cardiac source of embolism if clinically indicated. Consider CT angiography of the aorta to define aorta dimensions. FINDINGS  Left Ventricle: Left ventricular ejection fraction, by estimation, is 60 to 65%. The left ventricle has normal function. The left ventricle has no regional wall motion abnormalities. The left ventricular internal cavity size was normal in size. There is  mild asymmetric left ventricular hypertrophy of the septal segment. Left ventricular diastolic parameters were normal. Right Ventricle: The right ventricular size is  normal. No increase in right ventricular wall thickness. Right ventricular systolic function is normal. Tricuspid regurgitation signal is inadequate for assessing PA pressure. Left Atrium: Left atrial size was normal in size. Right Atrium: Right atrial size was normal in size. Pericardium: There is no evidence of pericardial effusion. Mitral Valve: The mitral valve is normal in structure. Normal mobility of the mitral valve leaflets. Mild mitral annular calcification. No evidence of mitral valve regurgitation. No evidence of mitral valve stenosis. Tricuspid Valve: The tricuspid valve is normal in structure. Tricuspid valve regurgitation is trivial. No evidence of tricuspid stenosis. Aortic Valve: The aortic valve is tricuspid. Aortic valve regurgitation is not visualized. No aortic stenosis is present. Pulmonic Valve: The pulmonic valve was normal in structure. Pulmonic valve regurgitation is trivial. No evidence of pulmonic stenosis. Aorta: Aortic dilatation noted. 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 Venous: The inferior vena cava is dilated in size with less than 50% respiratory variability, suggesting right atrial pressure of 15 mmHg. IAS/Shunts: The interatrial septum was not well visualized.  LEFT VENTRICLE PLAX 2D LVIDd:         6.10 cm  Diastology LVIDs:         4.70 cm  LV e' lateral:   11.70 cm/s LV PW:         0.80 cm  LV E/e' lateral: 8.0 LV IVS:        1.20 cm  LV e' medial:    12.40 cm/s LVOT diam:     2.60 cm  LV E/e' medial:  7.6 LV SV:         114 LV SV Index:   43 LVOT Area:     5.31 cm  RIGHT VENTRICLE RV S prime:     19.60 cm/s TAPSE (M-mode): 3.0 cm LEFT ATRIUM             Index       RIGHT ATRIUM           Index LA diam:        3.90 cm 1.46 cm/m  RA Area:     21.50 cm LA Vol (A2C):   74.2 ml 27.78 ml/m RA Volume:   60.10 ml  22.51 ml/m LA Vol (A4C):   71.9 ml 26.92 ml/m LA Biplane Vol: 78.6 ml 29.43 ml/m  AORTIC VALVE LVOT Vmax:   89.40 cm/s LVOT Vmean:   68.900 cm/s LVOT VTI:    0.214 m  AORTA Ao Root diam: 4.30 cm Ao Asc diam:  4.50 cm Ao Arch diam: 4.0 cm MITRAL VALVE MV Area (PHT): 3.77 cm    SHUNTS MV Decel Time: 201 msec    Systemic VTI:  0.21 m MV E velocity: 93.80 cm/s  Systemic  Diam: 2.60 cm MV A velocity: 51.40 cm/s MV E/A ratio:  1.82 Weston BrassGayatri Acharya MD Electronically signed by Weston BrassGayatri Acharya MD Signature Date/Time: 11/17/2019/10:46:56 AM    Final    VAS US LOWER EXTREMITY VENOUS (DVT)  Result Date: 11/17/2019  Lower Venous DVTStudy Indications: Stroke.  Performing Technologist: Jeb LeveringJill Parker RDMS, RVT  Examination Guidelines: A complete evaluation includes B-mode imaging, spectral Doppler, color Doppler, and power Doppler as needed of all accessible portions of each vessel. Bilateral testing is considered an integral part of a complete examination. Limited examinations for reoccurring indications may be performed as noted. The reflux portion of the exam is performed with the patient in reverse Trendelenburg.  +---------+---------------+---------+-----------+----------+--------------+ RIGHT    CompressibilityPhasicitySpontaneityPropertiesThrombus Aging +---------+---------------+---------+-----------+----------+--------------+ CFV      Full           Yes      Yes                                 +---------+---------------+---------+-----------+----------+--------------+ SFJ      Full                                                        +---------+---------------+---------+-----------+----------+--------------+ FV Prox  Full                                                        +---------+---------------+---------+-----------+----------+--------------+ FV Mid   Full                                                        +---------+---------------+---------+-----------+----------+--------------+ FV DistalFull                                                         +---------+---------------+---------+-----------+----------+--------------+ PFV      Full                                                        +---------+---------------+---------+-----------+----------+--------------+ POP      Full           Yes      Yes                                 +---------+---------------+---------+-----------+----------+--------------+ PTV      Full                                                        +---------+---------------+---------+-----------+----------+--------------+  PERO     Full                                                        +---------+---------------+---------+-----------+----------+--------------+   +---------+---------------+---------+-----------+----------+--------------+ LEFT     CompressibilityPhasicitySpontaneityPropertiesThrombus Aging +---------+---------------+---------+-----------+----------+--------------+ CFV      Full           Yes      Yes                                 +---------+---------------+---------+-----------+----------+--------------+ SFJ      Full                                                        +---------+---------------+---------+-----------+----------+--------------+ FV Prox  Full                                                        +---------+---------------+---------+-----------+----------+--------------+ FV Mid   Full                                                        +---------+---------------+---------+-----------+----------+--------------+ FV DistalFull                                                        +---------+---------------+---------+-----------+----------+--------------+ PFV      Full                                                        +---------+---------------+---------+-----------+----------+--------------+ POP      Full           Yes      Yes                                  +---------+---------------+---------+-----------+----------+--------------+ PTV      Full                                                        +---------+---------------+---------+-----------+----------+--------------+ PERO     Full                                                        +---------+---------------+---------+-----------+----------+--------------+  Summary: BILATERAL: - No evidence of deep vein thrombosis seen in the lower extremities, bilaterally. -No evidence of popliteal cyst, bilaterally.   *See table(s) above for measurements and observations. Electronically signed by Sherald Hess MD on 11/17/2019 at 2:59:28 PM.    Final       Huey Bienenstock M.D on 11/17/2019 at 3:32 PM    [facility] -  Office  [phone]

## 2019-11-17 NOTE — Progress Notes (Addendum)
Writer went to check the patient. Patient was sitting at the edge of his bed asking for a bath. He disconnected the IV and tele leads. Neurosurgeon came to his room to discussed the next step in his treatment. Patient more sleepy than this morning. VS stable. MD aware. Patient schedule for surgery this afternoon. Short stay was called for report.

## 2019-11-17 NOTE — Progress Notes (Signed)
Pt passed bedside swallow screen with RN.

## 2019-11-17 NOTE — Progress Notes (Signed)
SLP Cancellation Note  Patient Details Name: [name] MRN: [id number] DOB: 1970-07-04   Cancelled treatment:        Pt unable to participate per RN. Nauseous, unable to sit upright per OT note. Will continue attempts for swallow assessment.    Royce Macadamia 11/17/2019, 10:54 AM  Breck Coons Lonell Face.Ed Nurse, children's [phone] Office [phone]

## 2019-11-17 NOTE — Progress Notes (Signed)
Family worried about patient's headache, calling multiple times nursing station asking to talk with MD. Patient is complaining of headache that is irradiating to his neck. MD made aware and MD neurology is expected to see and assess the patient. Will continue to monitor.

## 2019-11-17 NOTE — Consult Note (Signed)
Neurosurgery Consultation  Reason for Consult: Posterior fossa stroke, hydrocephalus Referring Physician: Roda Shutters  CC: Headache  HPI: This is a 49 y.o. man that presents with acute onset severe vertigo and nausea, diagnosed with L PICA distribution stroke. Yesterday and today, he has been unable to eat 2/2 nausea and has developed a severe headache, which he currently rates as 10/10, no vomiting at this time but becoming progressively somnolent. Further history limited due to patient's altered mental status. +[id number], otherwise no recent use of anti-platelet or anti-coagulant medications.   ROS: A 14 point ROS was performed and is negative except as noted in the HPI.   PMHx:  Past Medical History:  Diagnosis Date  . Arthritis    neck  . Carpal tunnel syndrome on both sides 08/2014  . DDD (degenerative disc disease), lumbar   . History of kidney stones   . Hypertension    under control with med., has been on med. x 1 yr.  . Sleep apnea    CPAP but does not use   FamHx: History reviewed. No pertinent family history. SocHx:  reports that he has never smoked. He has never used smokeless tobacco. He reports current alcohol use. He reports that he does not use drugs.  Exam: Vital signs in last 24 hours: Temp:  [97.8 F (36.6 C)-98.5 F (36.9 C)] 97.9 F (36.6 C) (07/02 1400) Pulse Rate:  [58-106] 64 (07/02 1400) Resp:  [7-19] 16 (07/02 1400) BP: (113-157)/(62-113) 128/76 (07/02 1400) SpO2:  [96 %-100 %] 96 % (07/02 1400) Weight:  [144 kg] 144 kg (07/01 2018) General: Somnolent, lying in bed, holding his head in pain Head: Normocephalic and atruamatic HEENT: Neck supple Pulmonary: breathing room air comfortably, no evidence of increased work of breathing Cardiac: RRR Abdomen: S NT ND Extremities: Warm and well perfused x4 Neuro: Somnolent but Ox3 with wakening, PERRL, EOMI, FS Strength 5/5 x4, SILTx4   Assessment and Plan: 49 y.o. man w/ L PICA infarct then progressive somnolence  and severe headache. CTH personally reviewed, which shows left cerebellar hemisphere edema with obstruction of CSF flow and resultant ventriculomegaly consistent with obstructive hydrocephalus.   -OR now for suboccipital decompression, will get post-op CTH and follow exam to see if patient will ultimately require ventriculostomy -4N ICU post-op  [name], MD 11/17/19 3:06 PM [facility]

## 2019-11-18 ENCOUNTER — Encounter (HOSPITAL_COMMUNITY): Payer: Self-pay | Admitting: Internal Medicine

## 2019-11-18 LAB — BASIC METABOLIC PANEL
Anion gap: 8 (ref 5–15)
BUN: 11 mg/dL (ref 6–20)
CO2: 24 mmol/L (ref 22–32)
Calcium: 8.8 mg/dL — ABNORMAL LOW (ref 8.9–10.3)
Chloride: 106 mmol/L (ref 98–111)
Creatinine, Ser: 0.75 mg/dL (ref 0.61–1.24)
GFR calc Af Amer: >60 mL/min (ref >60)
GFR calc non Af Amer: >60 mL/min (ref >60)
Glucose, Bld: 132 mg/dL — ABNORMAL HIGH (ref 70–99)
Potassium: 3.7 mmol/L (ref 3.5–5.1)
Sodium: 138 mmol/L (ref 135–145)

## 2019-11-18 LAB — PROTEIN S, TOTAL: Protein S Ag, Total: 79 % (ref 60–150)

## 2019-11-18 LAB — PROTEIN S ACTIVITY: Protein S Activity: 84 % (ref 63–140)

## 2019-11-18 LAB — CBC
HCT: 42.2 % (ref 39.0–52.0)
Hemoglobin: 14.3 g/dL (ref 13.0–17.0)
MCH: 31.6 pg (ref 26.0–34.0)
MCHC: 33.9 g/dL (ref 30.0–36.0)
MCV: 93.4 fL (ref 80.0–100.0)
Platelets: 200 10*3/uL (ref 150–400)
RBC: 4.52 MIL/uL (ref 4.22–5.81)
RDW: 12.5 % (ref 11.5–15.5)
WBC: 11.1 10*3/uL — ABNORMAL HIGH (ref 4.0–10.5)
nRBC: 0 % (ref 0.0–0.2)

## 2019-11-18 LAB — LUPUS ANTICOAGULANT PANEL
DRVVT: 30.2 s (ref 0.0–47.0)
PTT Lupus Anticoagulant: 34.6 s (ref 0.0–51.9)

## 2019-11-18 LAB — BETA-2-GLYCOPROTEIN I ABS, IGG/M/A
Beta-2 Glyco I IgG: <9 GPI IgG units (ref 0–20)
Beta-2-Glycoprotein I IgA: <9 GPI IgA units (ref 0–25)
Beta-2-Glycoprotein I IgM: <9 GPI IgM units (ref 0–32)

## 2019-11-18 LAB — CARDIOLIPIN ANTIBODIES, IGG, IGM, IGA
Anticardiolipin IgA: <9 APL U/mL (ref 0–11)
Anticardiolipin IgG: 10 GPL U/mL (ref 0–14)
Anticardiolipin IgM: <9 MPL U/mL (ref 0–12)

## 2019-11-18 LAB — PROTEIN C, TOTAL: Protein C, Total: 117 % (ref 60–150)

## 2019-11-18 LAB — PROTEIN C ACTIVITY: Protein C Activity: 129 % (ref 73–180)

## 2019-11-18 LAB — RPR: RPR Ser Ql: NONREACTIVE

## 2019-11-18 NOTE — Progress Notes (Signed)
Patient does not want to wear CPAP tonight. Informed patient to let us know if he changed his mind.

## 2019-11-18 NOTE — Evaluation (Signed)
Physical Therapy Evaluation Patient Details Name: [name] MRN: [id number] DOB: [birth year] Today's Date: 11/18/2019   History of Present Illness  49 y.o. male with medical history significant of hypertension, hyperlipidemia, depression/anxiety, degenerative disc disease in lumbar region, obesity presents to emergency department for the evaluation of dizziness. While he was driving on 7/1 he felt dizzy and started having nausea and vomiting and diaphoresis, dizziness is worse with eyes open. MRI demonstrates L PICA infarct. Pt with increased HA and somnolence on 7/2. Repeat head CT demonstrating suboccipital hydrocephalus. Pt underwent suboccipital decompression on 7/2.  Clinical Impression  Pt presents to PT with deficits in functional mobility, gait, balance, cognition, endurance, power, strength. While pt denies dizziness this session he does demonstrates limited eye movement with mobility, attempting to maintain eyes midline with mobility and avoiding any pursuits or saccadic eye movement at this time. Pt will benefit from further PT POC to progress gait, balance, functional mobility training, and to further assess vestibular system. PT currently recommends CIR at the time of discharge to aide in a return toward independence as the pt was independent prior to admission and demonstrates the potential for significant functional gains.    Follow Up Recommendations CIR    Equipment Recommendations  Rolling walker with 5" wheels    Recommendations for Other Services Rehab consult     Precautions / Restrictions Precautions Precautions: Fall Restrictions Weight Bearing Restrictions: No      Mobility  Bed Mobility Overal bed mobility: Needs Assistance Bed Mobility: Supine to Sit     Supine to sit: Supervision;HOB elevated        Transfers Overall transfer level: Needs assistance Equipment used: Rolling walker (2 wheeled) Transfers: Sit to/from [employer] Sit  to Stand: Min guard Stand pivot transfers: Mod assist       General transfer comment: pt with lateral instability requiring physical assistance to prevent fall  Ambulation/Gait Ambulation/Gait assistance: Mod assist Gait Distance (Feet): 4 Feet Assistive device: Rolling walker (2 wheeled) Gait Pattern/deviations: Step-to pattern;Drifts right/left Gait velocity: reduced Gait velocity interpretation: <1.8 ft/sec, indicate of risk for recurrent falls General Gait Details: pt with lateral instability, requires physical assistance to prevent a fall  Stairs            Wheelchair Mobility    Modified Rankin (Stroke Patients Only) Modified Rankin (Stroke Patients Only) Pre-Morbid Rankin Score: No symptoms Modified Rankin: Moderately severe disability     Balance Overall balance assessment: Needs assistance Sitting-balance support: Feet supported;No upper extremity supported Sitting balance-Leahy Scale: Fair Sitting balance - Comments: supervision for static sitting balance   Standing balance support: Bilateral upper extremity supported Standing balance-Leahy Scale: Poor Standing balance comment: minA to maintain static standing, reliant on BUE support                             Pertinent Vitals/Pain Pain Assessment: Faces Faces Pain Scale: Hurts worst Pain Location: back of head Pain Descriptors / Indicators: Aching Pain Intervention(s): Monitored during session;Limited activity within patient's tolerance    Home Living Family/patient expects to be discharged to:: Private residence Living Arrangements: Spouse/significant other Available Help at Discharge: Family;Available 24 hours/day Type of Home: House Home Access: Stairs to enter Entrance Stairs-Rails: Right Entrance Stairs-Number of Steps: 3 Home Layout: One level Home Equipment: Walker - 4 wheels      Prior Function Level of Independence: Independent         Comments: works as a  welder      Hand Dominance   Dominant Hand: Right    Extremity/Trunk Assessment   Upper Extremity Assessment Upper Extremity Assessment: Overall WFL for tasks assessed    Lower Extremity Assessment Lower Extremity Assessment: Overall WFL for tasks assessed    Cervical / Trunk Assessment Cervical / Trunk Assessment: Normal  Communication   Communication: No difficulties  Cognition Arousal/Alertness: Awake/alert Behavior During Therapy: Flat affect Overall Cognitive Status: Impaired/Different from baseline Area of Impairment: Memory;Safety/judgement;Awareness;Problem solving                     Memory: Decreased short-term memory   Safety/Judgement: Decreased awareness of deficits Awareness: Emergent Problem Solving: Slow processing;Requires verbal cues        General Comments General comments (skin integrity, edema, etc.): VSS on RA. Pt denies dizziness this session although he does maintain eyes closed for much of session and when opening eyes only performs very slow and deliberate eye movements    Exercises     Assessment/Plan    PT Assessment Patient needs continued PT services  PT Problem List Decreased strength;Decreased activity tolerance;Decreased balance;Decreased mobility;Decreased knowledge of use of DME;Decreased safety awareness;Decreased knowledge of precautions;Other (comment) (vestibular impairments)       PT Treatment Interventions DME instruction;Gait training;Stair training;Functional mobility training;Therapeutic activities;Therapeutic exercise;Balance training;Neuromuscular re-education;Cognitive remediation;Patient/family education    PT Goals (Current goals can be found in the Care Plan section)  Acute Rehab PT Goals Patient Stated Goal: To return to independent mobility PT Goal Formulation: With patient Time For Goal Achievement: 12/02/19 Potential to Achieve Goals: Good    Frequency Min 4X/week   Barriers to discharge         Co-evaluation               AM-PAC PT "6 Clicks" Mobility  Outcome Measure Help needed turning from your back to your side while in a flat bed without using bedrails?: None Help needed moving from lying on your back to sitting on the side of a flat bed without using bedrails?: None Help needed moving to and from a bed to a chair (including a wheelchair)?: A Lot Help needed standing up from a chair using your arms (e.g., wheelchair or bedside chair)?: A Little Help needed to walk in hospital room?: A Lot Help needed climbing 3-5 steps with a railing? : Total 6 Click Score: 16    End of Session   Activity Tolerance: Patient tolerated treatment well Patient left: in chair;with call bell/phone within reach;with chair alarm set;with family/visitor present Nurse Communication: Mobility status PT Visit Diagnosis: Unsteadiness on feet (R26.81);Other abnormalities of gait and mobility (R26.89)    Time: 2423-5361 PT Time Calculation (min) (ACUTE ONLY): 35 min   Charges:   PT Evaluation $PT Eval Moderate Complexity: 1 Mod          Arlyss Gandy, PT, DPT Acute Rehabilitation Pager: [phone]   Arlyss Gandy 11/18/2019, 10:41 AM

## 2019-11-18 NOTE — Evaluation (Signed)
Occupational Therapy Evaluation Patient Details Name: [name] MRN: [id number] DOB: May 28, 1970 Today's Date: 11/18/2019    History of Present Illness 49 y.o. male with medical history significant of hypertension, hyperlipidemia, depression/anxiety, degenerative disc disease in lumbar region, obesity presents to emergency department for the evaluation of dizziness. While he was driving on 7/1 he felt dizzy and started having nausea and vomiting and diaphoresis, dizziness is worse with eyes open. MRI demonstrates L PICA infarct. Pt with increased HA and somnolence on 7/2. Repeat head CT demonstrating suboccipital hydrocephalus. Pt underwent suboccipital decompression on 7/2.   Clinical Impression   PTA pt living independently with spouse, working as a Psychologist, occupational. At time of eval, pt presents with ability to complete bed mobility at supervision level. He had just received pain medication, causing lethargy this session. While EOB assessed pt vision (will need to continue to be assessed considering pt was fighting sleep during session). Noted that pt has increased difficulty identifying objects in R peripheral field, difficulty tracking, and difficulty fixating gaze. He is also very sensitive to light, with pupils very constricted in dim lit room. Considering increased fatigue in posterior head pain, further assessment deferred at this time. Given current status, recommend CIR at d/c for continued functional neuro progression of pt previously independent baseline. Will continue to follow per POC listed below.    Follow Up Recommendations  CIR    Equipment Recommendations  Tub/shower seat    Recommendations for Other Services Rehab consult     Precautions / Restrictions Precautions Precautions: Fall Precaution Comments: sensitive to light Restrictions Weight Bearing Restrictions: No      Mobility Bed Mobility Overal bed mobility: Needs Assistance Bed Mobility: Supine to Sit     Supine  to sit: Supervision;HOB elevated        Transfers                      Balance Overall balance assessment: Needs assistance Sitting-balance support: Feet supported;No upper extremity supported Sitting balance-Leahy Scale: Fair Sitting balance - Comments: supervision for static sitting balance                                   ADL either performed or assessed with clinical judgement   ADL Overall ADL's : Needs assistance/impaired Eating/Feeding: Set up;Sitting   Grooming: Minimal assistance;Standing;Cueing for safety;Cueing for sequencing   Upper Body Bathing: Minimal assistance;Sitting   Lower Body Bathing: Moderate assistance;Sit to/from stand;Sitting/lateral leans;Cueing for sequencing;Cueing for safety   Upper Body Dressing : Minimal assistance;Sitting   Lower Body Dressing: Moderate assistance;Sit to/from stand;Sitting/lateral leans;Cueing for safety;Cueing for sequencing   Toilet Transfer: Moderate assistance;Cueing for safety;RW;Regular Toilet;Grab bars   Toileting- Clothing Manipulation and Hygiene: Minimal assistance;Sit to/from stand;Sitting/lateral lean       Functional mobility during ADLs: Moderate assistance;Cueing for safety;Cueing for sequencing;Rolling walker       Vision Baseline Vision/History: No visual deficits Patient Visual Report: Eye fatigue/eye pain/headache;Other (comment) (light sensitivity) Vision Assessment?: Vision impaired- to be further tested in functional context Additional Comments: will assess more in depth when pt is more alert (closing eyes often, feeling fatigued from pain meds). It appears pt has more difficulty identifying objects in R periphery. Eyes appeared disconjugate but pt was fatigued and fighting to keep eyes open. Difficulty tracking OT finger with assessment. Very sensitive to light- pupils very constricted in dim lit room     Perception  Praxis      Pertinent Vitals/Pain Pain Assessment:  Faces Faces Pain Scale: Hurts worst Pain Location: back of head Pain Descriptors / Indicators: Aching Pain Intervention(s): Monitored during session;Limited activity within patient's tolerance     Hand Dominance Right   Extremity/Trunk Assessment Upper Extremity Assessment Upper Extremity Assessment: Overall WFL for tasks assessed   Lower Extremity Assessment Lower Extremity Assessment: Defer to PT evaluation       Communication Communication Communication: No difficulties   Cognition Arousal/Alertness: Lethargic;Suspect due to medications Behavior During Therapy: Flat affect Overall Cognitive Status: Impaired/Different from baseline Area of Impairment: Memory;Safety/judgement;Awareness;Problem solving                     Memory: Decreased short-term memory   Safety/Judgement: Decreased awareness of deficits Awareness: Emergent Problem Solving: Slow processing;Requires verbal cues General Comments: pt recieved pain medications prior to session, quite drowsy. Noted deficits with problem solving basic mobility tasks   General Comments       Exercises     Shoulder Instructions      Home Living Family/patient expects to be discharged to:: Private residence Living Arrangements: Spouse/significant other Available Help at Discharge: Family;Available 24 hours/day Type of Home: House Home Access: Stairs to enter [employer] of Steps: 3 Entrance Stairs-Rails: Right Home Layout: One level     Bathroom Shower/Tub: Chief Strategy Officer: Standard     Home Equipment: Environmental consultant - 4 wheels          Prior Functioning/Environment Level of Independence: Independent        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 Problem List: Decreased strength;Impaired vision/perception;Decreased knowledge of use of DME or AE;Decreased knowledge of precautions;Decreased activity tolerance;Decreased cognition;Impaired balance (sitting and/or  standing);Pain      OT Treatment/Interventions: Self-care/ADL training;Visual/perceptual remediation/compensation;Therapeutic exercise;Patient/family education;Balance training;Neuromuscular education;Energy conservation;Therapeutic activities;DME and/or AE instruction;Cognitive remediation/compensation    OT Goals(Current goals can be found in the care plan section) Acute Rehab OT Goals Patient Stated Goal: return to baseline independence OT Goal Formulation: With patient Time For Goal Achievement: 12/02/19 Potential to Achieve Goals: Good  OT Frequency: Min 2X/week   Barriers to D/C:            Co-evaluation              AM-PAC OT "6 Clicks" Daily Activity     Outcome Measure Help from another person eating meals?: A Little Help from another person taking care of personal grooming?: A Little Help from another person toileting, which includes using toliet, bedpan, or urinal?: A Little Help from another person bathing (including washing, rinsing, drying)?: A Lot Help from another person to put on and taking off regular upper body clothing?: A Little Help from another person to put on and taking off regular lower body clothing?: A Lot 6 Click Score: 16   End of Session Nurse Communication: Mobility status  Activity Tolerance: Patient limited by lethargy Patient left: in bed;with call bell/phone within reach;with bed alarm set;with family/visitor present  OT Visit Diagnosis: Other abnormalities of gait and mobility (R26.89);Other symptoms and signs involving cognitive function;Other symptoms and signs involving the nervous system (R29.898);Low vision, both eyes (H54.2);Pain Pain - part of body:  (occipital region)                Time: 5400-8676 OT Time Calculation (min): 11 min Charges:  OT General Charges $OT Visit: 1 Visit OT Evaluation $OT Eval Moderate Complexity: 1  Mod  Dalphine Handing, MSOT, OTR/L Acute Rehabilitation Services [facility] Office Number:  [phone] Pager: [phone]  Dalphine Handing 11/18/2019, 4:59 PM

## 2019-11-18 NOTE — Progress Notes (Signed)
Inpatient Rehab Admissions Coordinator:   Met with patient and wife at bedside to discuss potential CIR admission. They are interested and wife states she can provide 24/7 support.  Will pursue for potential admit next week, pending bed availability.    Clemens Catholic, [place], [place] Admissions Coordinator  [phone] ([place]) [phone] (office)

## 2019-11-18 NOTE — Progress Notes (Signed)
STROKE TEAM PROGRESS NOTE   INTERVAL HISTORY: Patient's wife at bedside.  He is s/p suboccipital decompressive craniectomy last night.  His headache has improved since then.  Review of post-op CT shows less mass effect on 4th ventricle and less hydrocephalus.  There is expected left cerebellar edema from prior stroke and some mild petechial hemorrhage.    Vitals:   11/18/19 0400 11/18/19 0446 11/18/19 0500 11/18/19 0600  BP: (!) 104/55  125/78 129/85  Pulse: 93 88 88 86  Resp: 16 16 15 15   Temp: 98.3 F (36.8 C)     TempSrc: Oral     SpO2: 98% 98% 99% 100%  Weight:      Height:        CBC:  Recent Labs  Lab 11/16/19 0834 11/16/19 0834 11/16/19 1623 11/17/19 0305  WBC 5.3   < > 8.6 10.7*  NEUTROABS 3.1  --   --   --   HGB 15.4   < > 15.7 15.2  HCT 45.6   < > 47.7 44.2  MCV 93.1   < > 96.0 92.9  PLT 219   < > 201 218   < > = values in this interval not displayed.    Basic Metabolic Panel:  Recent Labs  Lab 11/16/19 0834 11/16/19 0834 11/16/19 1623 11/17/19 0305  NA 140  --   --  139  K 3.3*  --   --  3.4*  CL 107  --   --  107  CO2 20*  --   --  22  GLUCOSE 189*  --   --  117*  BUN 11  --   --  9  CREATININE 0.98   < > 1.04 0.81  CALCIUM 8.7*  --   --  9.1  MG  --   --  1.7  --   PHOS  --   --  2.0*  --    < > = values in this interval not displayed.   Lipid Panel:     Component Value Date/Time   CHOL 181 11/16/2019 1623   TRIG 34 11/16/2019 1623   HDL 40 (L) 11/16/2019 1623   CHOLHDL 4.5 11/16/2019 1623   VLDL 7 11/16/2019 1623   LDLCALC 134 (H) 11/16/2019 1623   HgbA1c:  Lab Results  Component Value Date   HGBA1C 5.7 (H) 11/16/2019   Urine Drug Screen:     Component Value Date/Time   LABOPIA NONE DETECTED 11/16/2019 1759   COCAINSCRNUR NONE DETECTED 11/16/2019 1759   LABBENZ NONE DETECTED 11/16/2019 1759   AMPHETMU NONE DETECTED 11/16/2019 1759   THCU POSITIVE (A) 11/16/2019 1759   LABBARB NONE DETECTED 11/16/2019 1759    Alcohol Level      Component Value Date/Time   ETH <10 11/16/2019 1623    IMAGING past 24 hours  CT HEAD WO CONTRAST  Result Date: 11/17/2019 CLINICAL DATA:  Stroke, follow-up. Suboccipital craniectomy for decompression. EXAM: CT HEAD WITHOUT CONTRAST TECHNIQUE: Contiguous axial images were obtained from the base of the skull through the vertex without intravenous contrast. COMPARISON:  Head CT 11/17/2019. FINDINGS: Brain: Postoperative changes from interval suboccipital decompressive craniectomy. There is scattered small volume acute subarachnoid hemorrhage within the posterior fossa. Persistent abnormal hypodensity at site of a subacute left cerebellar PICA territory infarct. Persistent partial effacement of the fourth ventricle. However, previously demonstrated mild lateral and third ventriculomegaly appears resolved. Trace scattered postoperative pneumocephalus predominantly within the posterior fossa. No demarcated cortical infarct is identified. No  supratentorial midline shift No evidence of intracranial mass. No midline shift. Vascular: No hyperdense vessel. Skull: Suboccipital craniectomy. Sinuses/Orbits: Visualized orbits show no acute finding. Mild scattered paranasal sinus mucosal thickening. Small left maxillary sinus mucous retention cyst. No significant mastoid effusion. Other: Postsurgical changes to the suboccipital and upper neck soft tissues with subcutaneous gas. IMPRESSION: Postoperative changes from interval suboccipital decompressive craniectomy. There is scattered small volume postoperative subarachnoid hemorrhage within the posterior fossa. Redemonstrated parenchymal edema at site of a subacute left cerebellar PICA territory infarct. Persistent partial effacement of the fourth ventricle. However, the previously demonstrated subtle lateral and third ventriculomegaly appears resolved. Electronically Signed   By: Jackey Loge DO   On: 11/17/2019 21:37   CT HEAD WO CONTRAST  Result Date:  11/17/2019 CLINICAL DATA:  Follow-up stroke EXAM: CT HEAD WITHOUT CONTRAST TECHNIQUE: Contiguous axial images were obtained from the base of the skull through the vertex without intravenous contrast. COMPARISON:  11/16/2019 FINDINGS: Brain: There again noted changes consistent with the known left cerebellar infarct. No findings to suggest acute hemorrhage, new infarct or space-occupying mass lesion are seen. Vascular: No hyperdense vessel or unexpected calcification. Skull: Normal. Negative for fracture or focal lesion. Sinuses/Orbits: No acute finding. Other: None. IMPRESSION: Left cerebellar infarct relatively stable from the prior exam. No new focal abnormality is seen. Electronically Signed   By: Alcide Clever M.D.   On: 11/17/2019 12:31   VAS [place] TRANSCRANIAL DOPPLER W BUBBLES  Result Date: 11/17/2019  Transcranial Doppler with Bubble Indications: Stroke. Performing Technologist: Jeb Levering RDMS, RVT  Examination Guidelines: A complete evaluation includes B-mode imaging, spectral Doppler, color Doppler, and power Doppler as needed of all accessible portions of each vessel. Bilateral testing is considered an integral part of a complete examination. Limited examinations for reoccurring indications may be performed as noted.  Summary:  A vascular evaluation was performed. The right middle cerebral artery was studied. An IV was inserted into the patient's left AC fossa. Verbal informed consent was obtained.  Mild to moderate HITS heard at rest. Unable to perform Valsalva due to patient condition. PFO size: Spencer degree III *See table(s) above for TCD measurements and observations.    Preliminary    ECHOCARDIOGRAM COMPLETE  Result Date: 11/17/2019    ECHOCARDIOGRAM REPORT   Patient Name:   TRAVARIUS LANGE Date of Exam: 11/17/2019 Medical Rec #:  [id number]      Height:       75.0 in Accession #:    [id number]     Weight:       317.5 lb Date of Birth:  October 31, 1970      BSA:          2.671 m Patient Age:    49 years        BP:           156/89 mmHg Patient Gender: M              HR:           80 bpm. Exam Location:  Inpatient Procedure: 2D Echo, Color Doppler and Cardiac Doppler Indications:    Stroke i163.9  History:        Patient has no prior history of Echocardiogram examinations.                 Risk Factors:Hypertension and Sleep Apnea.  Sonographer:    Irving Burton Senior RDCS Referring Phys: [id number] Ollen Bowl  Sonographer Comments: Unable to turn due to extreme nausea. IMPRESSIONS  1. Left ventricular ejection fraction, by estimation, is 60 to 65%. The left ventricle has normal function. The left ventricle has no regional wall motion abnormalities. There is mild asymmetric left ventricular hypertrophy of the septal segment. Left ventricular diastolic parameters were normal.  2. Right ventricular systolic function is normal. The right ventricular size is normal. Tricuspid regurgitation signal is inadequate for assessing PA pressure.  3. The mitral valve is normal in structure. No evidence of mitral valve regurgitation. No evidence of mitral stenosis.  4. The aortic valve is tricuspid. Aortic valve regurgitation is not visualized. No aortic stenosis is present.  5. Aortic dilatation noted. 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 Sinus of valsalva measures 43 mm, aortic arch appears 40 mm.  6. The inferior vena cava is dilated in size with <50% respiratory variability, suggesting right atrial pressure of 15 mmHg. Conclusion(s)/Recommendation(s): No intracardiac source of embolism detected on this transthoracic study. A transesophageal echocardiogram is recommended to exclude cardiac source of embolism if clinically indicated. Consider CT angiography of the aorta to define aorta dimensions. FINDINGS  Left Ventricle: Left ventricular ejection fraction, by estimation, is 60 to 65%. The left ventricle has normal function. The left ventricle has no regional wall motion abnormalities. The left  ventricular internal cavity size was normal in size. There is  mild asymmetric left ventricular hypertrophy of the septal segment. Left ventricular diastolic parameters were normal. Right Ventricle: The right ventricular size is normal. No increase in right ventricular wall thickness. Right ventricular systolic function is normal. Tricuspid regurgitation signal is inadequate for assessing PA pressure. Left Atrium: Left atrial size was normal in size. Right Atrium: Right atrial size was normal in size. Pericardium: There is no evidence of pericardial effusion. Mitral Valve: The mitral valve is normal in structure. Normal mobility of the mitral valve leaflets. Mild mitral annular calcification. No evidence of mitral valve regurgitation. No evidence of mitral valve stenosis. Tricuspid Valve: The tricuspid valve is normal in structure. Tricuspid valve regurgitation is trivial. No evidence of tricuspid stenosis. Aortic Valve: The aortic valve is tricuspid. Aortic valve regurgitation is not visualized. No aortic stenosis is present. Pulmonic Valve: The pulmonic valve was normal in structure. Pulmonic valve regurgitation is trivial. No evidence of pulmonic stenosis. Aorta: Aortic dilatation noted. 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 Venous: The inferior vena cava is dilated in size with less than 50% respiratory variability, suggesting right atrial pressure of 15 mmHg. IAS/Shunts: The interatrial septum was not well visualized.  LEFT VENTRICLE PLAX 2D LVIDd:         6.10 cm  Diastology LVIDs:         4.70 cm  LV e' lateral:   11.70 cm/s LV PW:         0.80 cm  LV E/e' lateral: 8.0 LV IVS:        1.20 cm  LV e' medial:    12.40 cm/s LVOT diam:     2.60 cm  LV E/e' medial:  7.6 LV SV:         114 LV SV Index:   43 LVOT Area:     5.31 cm  RIGHT VENTRICLE RV S prime:     19.60 cm/s TAPSE (M-mode): 3.0 cm LEFT ATRIUM             Index       RIGHT ATRIUM           Index LA diam:  3.90 cm  1.46 cm/m  RA Area:     21.50 cm LA Vol (A2C):   74.2 ml 27.78 ml/m RA Volume:   60.10 ml  22.51 ml/m LA Vol (A4C):   71.9 ml 26.92 ml/m LA Biplane Vol: 78.6 ml 29.43 ml/m  AORTIC VALVE LVOT Vmax:   89.40 cm/s LVOT Vmean:  68.900 cm/s LVOT VTI:    0.214 m  AORTA Ao Root diam: 4.30 cm Ao Asc diam:  4.50 cm Ao Arch diam: 4.0 cm MITRAL VALVE MV Area (PHT): 3.77 cm    SHUNTS MV Decel Time: 201 msec    Systemic VTI:  0.21 m MV E velocity: 93.80 cm/s  Systemic Diam: 2.60 cm MV A velocity: 51.40 cm/s MV E/A ratio:  1.82 Weston Brass MD Electronically signed by Weston Brass MD Signature Date/Time: 11/17/2019/10:46:56 AM    Final    VAS [place] LOWER EXTREMITY VENOUS (DVT)  Result Date: 11/17/2019  Lower Venous DVTStudy Indications: Stroke.  Performing Technologist: Jeb Levering RDMS, RVT  Examination Guidelines: A complete evaluation includes B-mode imaging, spectral Doppler, color Doppler, and power Doppler as needed of all accessible portions of each vessel. Bilateral testing is considered an integral part of a complete examination. Limited examinations for reoccurring indications may be performed as noted. The reflux portion of the exam is performed with the patient in reverse Trendelenburg.  +---------+---------------+---------+-----------+----------+--------------+ RIGHT    CompressibilityPhasicitySpontaneityPropertiesThrombus Aging +---------+---------------+---------+-----------+----------+--------------+ CFV      Full           Yes      Yes                                 +---------+---------------+---------+-----------+----------+--------------+ SFJ      Full                                                        +---------+---------------+---------+-----------+----------+--------------+ FV Prox  Full                                                        +---------+---------------+---------+-----------+----------+--------------+ FV Mid   Full                                                         +---------+---------------+---------+-----------+----------+--------------+ FV DistalFull                                                        +---------+---------------+---------+-----------+----------+--------------+ PFV      Full                                                        +---------+---------------+---------+-----------+----------+--------------+  POP      Full           Yes      Yes                                 +---------+---------------+---------+-----------+----------+--------------+ PTV      Full                                                        +---------+---------------+---------+-----------+----------+--------------+ PERO     Full                                                        +---------+---------------+---------+-----------+----------+--------------+   +---------+---------------+---------+-----------+----------+--------------+ LEFT     CompressibilityPhasicitySpontaneityPropertiesThrombus Aging +---------+---------------+---------+-----------+----------+--------------+ CFV      Full           Yes      Yes                                 +---------+---------------+---------+-----------+----------+--------------+ SFJ      Full                                                        +---------+---------------+---------+-----------+----------+--------------+ FV Prox  Full                                                        +---------+---------------+---------+-----------+----------+--------------+ FV Mid   Full                                                        +---------+---------------+---------+-----------+----------+--------------+ FV DistalFull                                                        +---------+---------------+---------+-----------+----------+--------------+ PFV      Full                                                         +---------+---------------+---------+-----------+----------+--------------+ POP      Full           Yes      Yes                                 +---------+---------------+---------+-----------+----------+--------------+  PTV      Full                                                        +---------+---------------+---------+-----------+----------+--------------+ PERO     Full                                                        +---------+---------------+---------+-----------+----------+--------------+     Summary: BILATERAL: - No evidence of deep vein thrombosis seen in the lower extremities, bilaterally. -No evidence of popliteal cyst, bilaterally.   *See table(s) above for measurements and observations. Electronically signed by Sherald Hess MD on 11/17/2019 at 2:59:28 PM.    Final     PHYSICAL EXAM    Temp:  [97.5 F (36.4 C)-99.9 F (37.7 C)] 98.3 F (36.8 C) (07/03 0400) Pulse Rate:  [58-103] 86 (07/03 0600) Resp:  [12-18] 15 (07/03 0600) BP: (104-157)/(55-113) 129/85 (07/03 0600) SpO2:  [92 %-100 %] 100 % (07/03 0600) Arterial Line BP: (131-193)/(69-102) 134/69 (07/03 0600)  Awake, alert, fully oriented. PERL, EOMI.  Face symmetrical.  Tongue midline. Strength 5/5 BUE and BLE. Sensory - intact. Coord - mild dysmetria with FTN on left, intact on right  ASSESSMENT/PLAN Mr. INIKO ROBLES is a 49 y.o. male with history of B carpal tunnel syndrome, hypertension, sleep apnea with intermittent CPAP usage, degenerative disc disease of the lumbar spine presenting with dizziness, ataxic gait along with CP.   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 Punctate L occipital infarct. Sinus dz.   CTA head & neck evolving L PICA infarct. No LVO  CT Head - 11/17/19 1231 - Left cerebellar infarct relatively stable from the prior exam.  CT Head - 11/17/19 2137 - Postoperative changes  from interval suboccipital decompressive craniectomy. There is scattered small volume postoperative subarachnoid hemorrhage within the posterior fossa. Redemonstrated parenchymal edema at site of a subacute left cerebellar PICA territory infarct. Persistent partial effacement of the fourth ventricle. However, the previously demonstrated subtle lateral and third ventriculomegaly appears resolved. No new focal abnormality is seen.  SARS - negative  UDS - THC  LE Doppler no DVT  2D Echo EF 60 to 65%  TCD Doppler w/ bubble positive for PFO at rest, not able to perform valsalva this time  TEE scheduled for 11/21/2019 at 1130 for stroke work up  LDL 134  HgbA1c 5.7  Hypercoagulable and autoimmune labs pending   lovenox for VTE prophylaxis  No antithrombotic prior to admission, now on aspirin 81 mg daily and plavix 75 DAPT for 3 weeks and then ASA alone.  Therapy recommendations:  pending   Disposition:  pending   HA  Has been going on days  Worsening this am  Repeat CT stat to rule out hydrocephalus - 11/17/19 1231 - Left cerebellar infarct relatively stable from the prior exam. No new focal abnormality is seen. ->OR (see below)  Fioricet and tylenoal PRN  Pt educated on coping with stress and lower anxiety level  PFO  Positive TCD bubble study at rest - spencer degree III  Not able to do valsalva due to elevated ICP and not cooperative due to sleepiness  Could be the cause of current stroke  Will need outpt follow up with Dr. Excell Seltzer to consider PFO closure as outpt  Hypertension  Stable . Permissive hypertension (OK if < 220/120) but gradually normalize in 2-3 days . Long-term BP goal normotensive  Hyperlipidemia  Home meds:  lipitor 10  Now on lipitor 80   LDL 134, goal < 70  Continue statin at discharge  Suboccipital Craniectomy - [name], MD - 11/17/19 - 7:34 pm  Worsening Headache - CTH showed brainstem compression and obstructive  hydrocephalus.   DR Maurice Small recommended emergent suboccipital decompression with possible placement of external ventricular drain  Other Stroke Risk Factors  ETOH use, alcohol level <10, advised to drink no more than 2 drink(s) a day  Substance abuse - UDS:  THC POSITIVE. Patient advised to stop using due to stroke risk.  Obesity, Body mass index is 39.68 kg/m., recommend weight loss, diet and exercise as appropriate   Family hx stroke (father). Mother died of MI in her early 66s.   Obstructive sleep apnea, does not use CPAP at home  Other Active Problems  CP, resolved - troponin < 2 -> 5  Hypokalemia 3.4 - repeat pending  Depression/anxiety on lexapro and klonopin  Lumbar degenerative disc disease, stable  Leukocytosis 10.7 - (afebrile) repeat pending  Lactic acidosis 5.2->2.0  Per Dr Roda Shutters 11/17/19 "Pt was seen again for TCD bubble study. As per wife, pt is sleepy after the medication (morphine and fioricet) did not complain of HA at this time. However, when pt briefly woke up, he seems still in pain. CT repeat showed developing hydrocephalus. I discussed with Dr. Marcia Brash and he agreed with emergency EVD and subocciptal decompression. I also discussed with wife that Dr. Marcia Brash will talk to her to get consent. 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 Not able to do valsalva due to noncooperative and pt has increased ICP. Will schedule TEE next Tuesday if able to do.  Pt will transfer to OR and then will admit to 4N. Stroke team will take over while pt at 4N, will transfer back to hospitalist service when transfer out of ICU.  Hold plavix for surgery. 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    He has PFO by TCD.  Awaiting TEE to confirm.  Consideration for  loop recorder at time of TEE as well.   No other clear risk factors for stroke.  BP is good.  Not diabetic.  LDL slightly high but not significant.  Non-smoker.  No vasoconstrictive illicit drugs on board.    Cont ASA and Lipitor.  Avoid narcotics if possible to reduce sedation so that neurological exam is not affected.    Weston Settle, MS, MD   Hospital day # 2     To contact Stroke Continuity provider, please refer to [email or web address]. After hours, contact General Neurology

## 2019-11-18 NOTE — Progress Notes (Signed)
Neurosurgery Service Progress Note  Subjective: No acute events overnight, headache and vertigo improved post-op   Objective: Vitals:   11/18/19 1000 11/18/19 1100 11/18/19 1200 11/18/19 1300  BP: 115/84 118/78 (!) 125/99 115/80  Pulse: 89 90 99 96  Resp: 16 17 18 18   Temp:      TempSrc:      SpO2: 98% 98% 97% 97%  Weight:      Height:       Temp (24hrs), Avg:98.6 F (37 C), Min:97.5 F (36.4 C), Max:99.9 F (37.7 C)  CBC Latest Ref Rng & Units 11/18/2019 11/17/2019 11/16/2019  WBC 4.0 - 10.5 K/uL 11.1(H) 10.7(H) 8.6  Hemoglobin 13.0 - 17.0 g/dL 01/17/2020 31.5 40.0  Hematocrit 39 - 52 % 42.2 44.2 47.7  Platelets 150 - 400 K/uL 200 218 201   BMP Latest Ref Rng & Units 11/18/2019 11/17/2019 11/16/2019  Glucose 70 - 99 mg/dL 01/17/2020) 619(J) -  BUN 6 - 20 mg/dL 11 9 -  Creatinine 093(O - 1.24 mg/dL 6.71 2.45 8.09  Sodium 135 - 145 mmol/L 138 139 -  Potassium 3.5 - 5.1 mmol/L 3.7 3.4(L) -  Chloride 98 - 111 mmol/L 106 107 -  CO2 22 - 32 mmol/L 24 22 -  Calcium 8.9 - 10.3 mg/dL 9.83) 9.1 -    Intake/Output Summary (Last 24 hours) at 11/18/2019 1429 Last data filed at 11/18/2019 1300 Gross per 24 hour  Intake 3209.52 ml  Output 2600 ml  Net 609.52 ml    Current Facility-Administered Medications:  .   stroke: mapping our early stages of recovery book, , Does not apply, Once, Hollin Crewe, 01/19/2020 A, MD .  0.9 %  sodium chloride infusion, , Intravenous, Continuous, Timmia Cogburn A, MD .  0.9 % NaCl with KCl 20 mEq/ L  infusion, , Intravenous, Continuous, Rilley Poulter A, MD, Last Rate: 75 mL/hr at 11/18/19 1300, Rate Verify at 11/18/19 1300 .  acetaminophen (TYLENOL) tablet 650 mg, 650 mg, Oral, Q6H PRN, 650 mg at 11/18/19 0602 **OR** acetaminophen (TYLENOL) suppository 650 mg, 650 mg, Rectal, Q6H PRN, 0603, MD .  aspirin EC tablet 81 mg, 81 mg, Oral, Daily, [name], MD, 81 mg at 11/18/19 0941 .  atorvastatin (LIPITOR) tablet 80 mg, 80 mg, Oral, Daily, 01/19/20, MD, 80 mg at 11/18/19 0941 .  butalbital-acetaminophen-caffeine (FIORICET) 50-325-40 MG per tablet 2 tablet, 2 tablet, Oral, Q6H PRN, 01/19/20, MD, 2 tablet at 11/18/19 1117 .  Chlorhexidine Gluconate Cloth 2 % PADS 6 each, 6 each, Topical, Daily, 01/19/20, MD, 6 each at 11/17/19 2200 .  clevidipine (CLEVIPREX) infusion 0.5 mg/mL, 0-21 mg/hr, Intravenous, Continuous, Bernie Fobes A, MD, Last Rate: 8 mL/hr at 11/18/19 1300, 4 mg/hr at 11/18/19 1300 .  clonazePAM (KLONOPIN) tablet 0.5 mg, 0.5 mg, Oral, PRN, 01/19/20, MD, 0.5 mg at 11/17/19 2204 .  escitalopram (LEXAPRO) tablet 10 mg, 10 mg, Oral, Daily, Shaquinta Peruski A, MD .  HYDROmorphone (DILAUDID) injection 0.5 mg, 0.5 mg, Intravenous, Q2H PRN, 2205, MD, 0.5 mg at 11/18/19 0911 .  meclizine (ANTIVERT) tablet 25 mg, 25 mg, Oral, TID PRN, 01/19/20, MD, 25 mg at 11/16/19 1838 .  mupirocin ointment (BACTROBAN) 2 % 1 application, 1 application, Nasal, BID, Terin Dierolf, 01/17/20, MD, 1 application at 11/17/19 2200 .  ondansetron (ZOFRAN) tablet 4 mg, 4 mg, Oral, Q6H PRN **OR** ondansetron (ZOFRAN) injection 4 mg, 4 mg, Intravenous, Q6H PRN, 01/18/20  A, MD, 4 mg at 11/18/19 0902 .  oxyCODONE (Oxy IR/ROXICODONE) immediate release tablet 10 mg, 10 mg, Oral, Q4H PRN, [name], MD, 10 mg at 11/18/19 1405 .  oxyCODONE (Oxy IR/ROXICODONE) immediate release tablet 5 mg, 5 mg, Oral, Q4H PRN, [name], MD, 5 mg at 11/17/19 2204 .  promethazine (PHENERGAN) injection 25 mg, 25 mg, Intravenous, Q6H PRN, [name], MD   Physical Exam: Somnolent, was just medicated, but shortly before my arrival he was able to get out of bed and walk to the chair, awakens to voice and is interactive, FCx4  Assessment & Plan: 49 y.o. man s/p suboccipital craniectomy for PICA stroke w/ hydrocephalus, recovering well. Post-op CTH with decompression, improved ventricular  size.  -hold SQH until POD2  [name]  11/18/19 2:29 PM

## 2019-11-19 LAB — BASIC METABOLIC PANEL
Anion gap: 9 (ref 5–15)
BUN: 8 mg/dL (ref 6–20)
CO2: 25 mmol/L (ref 22–32)
Calcium: 8.7 mg/dL — ABNORMAL LOW (ref 8.9–10.3)
Chloride: 100 mmol/L (ref 98–111)
Creatinine, Ser: 0.66 mg/dL (ref 0.61–1.24)
GFR calc Af Amer: >60 mL/min (ref >60)
GFR calc non Af Amer: >60 mL/min (ref >60)
Glucose, Bld: 135 mg/dL — ABNORMAL HIGH (ref 70–99)
Potassium: 3.4 mmol/L — ABNORMAL LOW (ref 3.5–5.1)
Sodium: 134 mmol/L — ABNORMAL LOW (ref 135–145)

## 2019-11-19 LAB — CBC
HCT: 42.7 % (ref 39.0–52.0)
Hemoglobin: 14.6 g/dL (ref 13.0–17.0)
MCH: 32.1 pg (ref 26.0–34.0)
MCHC: 34.2 g/dL (ref 30.0–36.0)
MCV: 93.8 fL (ref 80.0–100.0)
Platelets: 203 10*3/uL (ref 150–400)
RBC: 4.55 MIL/uL (ref 4.22–5.81)
RDW: 11.9 % (ref 11.5–15.5)
WBC: 11.4 10*3/uL — ABNORMAL HIGH (ref 4.0–10.5)
nRBC: 0 % (ref 0.0–0.2)

## 2019-11-19 MED ORDER — TRAMADOL HCL 50 MG PO TABS
50.0000 mg | ORAL_TABLET | Freq: Four times a day (QID) | ORAL | Status: DC | PRN
  Administered 2019-11-19 – 2019-11-22 (×3): 50 mg via ORAL
  Filled 2019-11-19 (×3): qty 1

## 2019-11-19 MED ORDER — LABETALOL HCL 5 MG/ML IV SOLN
INTRAVENOUS | Status: AC
  Administered 2019-11-19: 10 mg via INTRAVENOUS
  Filled 2019-11-19: qty 4

## 2019-11-19 MED ORDER — POTASSIUM CHLORIDE 20 MEQ PO PACK
20.0000 meq | PACK | Freq: Two times a day (BID) | ORAL | Status: AC
  Administered 2019-11-19 (×2): 20 meq via ORAL
  Filled 2019-11-19 (×2): qty 1

## 2019-11-19 MED ORDER — ORAL CARE MOUTH RINSE
15.0000 mL | Freq: Two times a day (BID) | OROMUCOSAL | Status: DC
  Administered 2019-11-19 – 2019-11-22 (×5): 15 mL via OROMUCOSAL

## 2019-11-19 MED ORDER — POTASSIUM CHLORIDE CRYS ER 20 MEQ PO TBCR: 20.0000 meq | EXTENDED_RELEASE_TABLET | Freq: Two times a day (BID) | ORAL | Status: DC

## 2019-11-19 MED ORDER — TRAMADOL HCL 50 MG PO TABS: 50.0000 mg | ORAL_TABLET | Freq: Four times a day (QID) | ORAL | Status: DC

## 2019-11-19 MED ORDER — ENOXAPARIN SODIUM 40 MG/0.4ML ~~LOC~~ SOLN
40.0000 mg | Freq: Every day | SUBCUTANEOUS | Status: DC
  Administered 2019-11-19 – 2019-11-22 (×4): 40 mg via SUBCUTANEOUS
  Filled 2019-11-19 (×4): qty 0.4

## 2019-11-19 MED ORDER — LABETALOL HCL 5 MG/ML IV SOLN
10.0000 mg | INTRAVENOUS | Status: DC | PRN
  Administered 2019-11-20: 10 mg via INTRAVENOUS
  Filled 2019-11-19: qty 4

## 2019-11-19 NOTE — Progress Notes (Signed)
STROKE TEAM PROGRESS NOTE   INTERVAL HISTORY: Family at bedside.   His headache has improved since then.  Nausea is well controlled.    Vitals:   11/19/19 0215 11/19/19 0230 11/19/19 0245 11/19/19 0400  BP:  122/72    Pulse: 99 (!) 101 100   Resp: 16 16 15    Temp:    98.7 F (37.1 C)  TempSrc:    Oral  SpO2: 95% 96% 97%   Weight:      Height:        CBC:  Recent Labs  Lab 11/16/19 0834 11/16/19 1623 11/18/19 0946 11/19/19 0524  WBC 5.3   < > 11.1* 11.4*  NEUTROABS 3.1  --   --   --   HGB 15.4   < > 14.3 14.6  HCT 45.6   < > 42.2 42.7  MCV 93.1   < > 93.4 93.8  PLT 219   < > 200 203   < > = values in this interval not displayed.    Basic Metabolic Panel:  Recent Labs  Lab 11/16/19 1623 11/17/19 0305 11/18/19 0946 11/19/19 0524  NA  --    < > 138 134*  K  --    < > 3.7 3.4*  CL  --    < > 106 100  CO2  --    < > 24 25  GLUCOSE  --    < > 132* 135*  BUN  --    < > 11 8  CREATININE 1.04   < > 0.75 0.66  CALCIUM  --    < > 8.8* 8.7*  MG 1.7  --   --   --   PHOS 2.0*  --   --   --    < > = values in this interval not displayed.   Lipid Panel:     Component Value Date/Time   CHOL 181 11/16/2019 1623   TRIG 34 11/16/2019 1623   HDL 40 (L) 11/16/2019 1623   CHOLHDL 4.5 11/16/2019 1623   VLDL 7 11/16/2019 1623   LDLCALC 134 (H) 11/16/2019 1623   HgbA1c:  Lab Results  Component Value Date   HGBA1C 5.7 (H) 11/16/2019   Urine Drug Screen:     Component Value Date/Time   LABOPIA NONE DETECTED 11/16/2019 1759   COCAINSCRNUR NONE DETECTED 11/16/2019 1759   LABBENZ NONE DETECTED 11/16/2019 1759   AMPHETMU NONE DETECTED 11/16/2019 1759   THCU POSITIVE (A) 11/16/2019 1759   LABBARB NONE DETECTED 11/16/2019 1759    Alcohol Level     Component Value Date/Time   ETH <10 11/16/2019 1623    IMAGING past 24 hours  No results found.  PHYSICAL EXAM    Temp:  [98.3 F (36.8 C)-98.7 F (37.1 C)] 98.7 F (37.1 C) (07/04 0400) Pulse Rate:  [83-127] 100  (07/04 0245) Resp:  [13-21] 15 (07/04 0245) BP: (100-135)/(59-99) 122/72 (07/04 0230) SpO2:  [91 %-100 %] 97 % (07/04 0245) Arterial Line BP: (101-175)/(65-112) 146/68 (07/04 0245)  Drowsy, but awakens appropriately; fully oriented. PERL, EOMI.  Face symmetrical.  Tongue midline. Strength 5/5 BUE and BLE. Sensory - intact. Coord - mild dysmetria with FTN on left, intact on right  ASSESSMENT/PLAN [name] is a 49 y.o. [name] with history of B carpal tunnel syndrome, hypertension, sleep apnea with intermittent CPAP usage, degenerative disc disease of the lumbar spine presenting with dizziness, ataxic gait along with CP.   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 Punctate L occipital infarct. Sinus dz.   CTA head & neck evolving L PICA infarct. No LVO  CT Head - 11/17/19 1231 - Left cerebellar infarct relatively stable from the prior exam.  CT Head - 11/17/19 2137 - Postoperative changes from interval suboccipital decompressive craniectomy. There is scattered small volume postoperative subarachnoid hemorrhage within the posterior fossa. Redemonstrated parenchymal edema at site of a subacute left cerebellar PICA territory infarct. Persistent partial effacement of the fourth ventricle. However, the previously demonstrated subtle lateral and third ventriculomegaly appears resolved. No new focal abnormality is seen.  SARS - negative  UDS - THC  LE Doppler no DVT  2D Echo EF 60 to 65%  TCD Doppler w/ bubble positive for PFO at rest, not able to perform valsalva this time  TEE scheduled for 11/21/2019 at 1130 for stroke work up  LDL 134  HgbA1c 5.7  Hypercoagulable and autoimmune labs pending   lovenox for VTE prophylaxis  No antithrombotic prior to admission, now on aspirin 81 mg daily and plavix 75 DAPT for 3 weeks and then ASA alone.  Therapy recommendations:  pending   Disposition:   pending   HA  Has been going on days  Worsening this am  Repeat CT stat to rule out hydrocephalus - 11/17/19 1231 - Left cerebellar infarct relatively stable from the prior exam. No new focal abnormality is seen. ->OR (see below)  Fioricet and tylenoal PRN  Pt educated on coping with stress and lower anxiety level  PFO  Positive TCD bubble study at rest - spencer degree III  Not able to do valsalva due to elevated ICP and not cooperative due to sleepiness  Could be the cause of current stroke  Will need outpt follow up with Dr. Excell Seltzer to consider PFO closure as outpt  Hypertension  Stable . Permissive hypertension (OK if < 220/120) but gradually normalize in 2-3 days . Long-term BP goal normotensive  Hyperlipidemia  Home meds:  lipitor 10  Now on lipitor 80   LDL 134, goal < 70  Continue statin at discharge  Suboccipital Craniectomy - [name], MD - 11/17/19 - 7:34 pm  Worsening Headache - CTH showed brainstem compression and obstructive hydrocephalus.   DR Maurice Small recommended emergent suboccipital decompression with possible placement of external ventricular drain  Other Stroke Risk Factors  ETOH use, alcohol level <10, advised to drink no more than 2 drink(s) a day  Substance abuse - UDS:  THC POSITIVE. Patient advised to stop using due to stroke risk.  Obesity, Body mass index is 39.68 kg/m., recommend weight loss, diet and exercise as appropriate   Family hx stroke (father). Mother died of MI in her early 57s.   Obstructive sleep apnea, does not use CPAP at home  Other Active Problems  CP, resolved - troponin < 2 -> 5  Hypokalemia 3.4 -> supplement (also receiving NS IV with 20 meq KCL at 75 cc /hr) May be able to decrease rate now that pt is on heart healthy diet with thin liquids.  Depression/anxiety on lexapro and klonopin  Lumbar degenerative disc disease, stable  Leukocytosis 10.7->11.4 - (afebrile)  Lactic acidosis  5.2->2.0  Per Dr Roda Shutters 11/17/19 "Pt was seen again for TCD bubble study. As per wife, pt is sleepy after the medication (morphine and fioricet) did not complain of HA at this time. However, when pt briefly woke up,  he seems still in pain. CT repeat showed developing hydrocephalus. I discussed with Dr. Marcia Brash and he agreed with emergency EVD and subocciptal decompression. I also discussed with wife that Dr. Marcia Brash will talk to her to get consent. 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 Not able to do valsalva due to noncooperative and pt has increased ICP. Will schedule TEE next Tuesday if able to do. (note sent to cardiology - npo order written) May need loop as well. To be discussed. Pt will transfer to OR and then will admit to 4N. Stroke team will take over while pt at 4N, will transfer back to hospitalist service when transfer out of ICU.  Hold plavix for surgery. 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    He is quite drowsy secondary to opioid pain killers mostly.  I will lower Oxycodone dose and stop the Dilaudid.  I am avoiding NSAIDs due to some petechial hemorrhage in previous CT.  However, I am re-starting Lovenox SQ for DVT prophylaxis based on neurosurgeon recommendations.    He has PFO by TCD.  Awaiting TEE to confirm.  Consideration for loop recorder at time of TEE as well.   No other clear risk factors for stroke.  BP is good.  Not diabetic.  LDL slightly high but not significant.  Non-smoker.  No vasoconstrictive illicit drugs on board.    Cont ASA and Lipitor.    Weston Settle, MS, MD   Hospital day # 3     To contact Stroke Continuity provider, please refer to [email or web address]. After hours, contact General Neurology

## 2019-11-19 NOTE — Progress Notes (Signed)
Neurosurgery Service Progress Note  Subjective: No acute events overnight, headache & vertigo still resolved  Objective: Vitals:   11/19/19 0530 11/19/19 0545 11/19/19 0600 11/19/19 0615  BP: 119/73  131/79   Pulse: (!) 102 (!) 102 98 (!) 103  Resp: 16 15 17 17   Temp:      TempSrc:      SpO2: 95% 96% 92% (!) 88%  Weight:      Height:       Temp (24hrs), Avg:98.5 F (36.9 C), Min:98.3 F (36.8 C), Max:98.7 F (37.1 C)  CBC Latest Ref Rng & Units 11/19/2019 11/18/2019 11/17/2019  WBC 4.0 - 10.5 K/uL 11.4(H) 11.1(H) 10.7(H)  Hemoglobin 13.0 - 17.0 g/dL 01/18/2020 58.8 32.5  Hematocrit 39 - 52 % 42.7 42.2 44.2  Platelets 150 - 400 K/uL 203 200 218   BMP Latest Ref Rng & Units 11/19/2019 11/18/2019 11/17/2019  Glucose 70 - 99 mg/dL 01/18/2020) 264(B) 583(E)  BUN 6 - 20 mg/dL 8 11 9   Creatinine 0.61 - 1.24 mg/dL 940(H 6.80  Sodium 135 - 145 mmol/L 134(L) 138 139  Potassium 3.5 - 5.1 mmol/L 3.4(L) 3.7 3.4(L)  Chloride 98 - 111 mmol/L 100 106 107  CO2 22 - 32 mmol/L 25 24 22   Calcium 8.9 - 10.3 mg/dL 8.81) 1.03) 9.1    Intake/Output Summary (Last 24 hours) at 11/19/2019 1.5(X Last data filed at 11/19/2019 0600 Gross per 24 hour  Intake 2655.84 ml  Output 2450 ml  Net 205.84 ml    Current Facility-Administered Medications:  .   stroke: mapping our early stages of recovery book, , Does not apply, Once, 01/20/2020 A, MD .  0.9 %  sodium chloride infusion, , Intravenous, Continuous, Shannon Balthazar A, MD .  0.9 % NaCl with KCl 20 mEq/ L  infusion, , Intravenous, Continuous, Antoinett Dorman A, MD, Last Rate: 75 mL/hr at 11/19/19 0600, Rate Verify at 11/19/19 0600 .  acetaminophen (TYLENOL) tablet 650 mg, 650 mg, Oral, Q6H PRN, 650 mg at 11/19/19 0509 **OR** acetaminophen (TYLENOL) suppository 650 mg, 650 mg, Rectal, Q6H PRN, 01/20/20, MD .  aspirin EC tablet 81 mg, 81 mg, Oral, Daily, 01/20/20, MD, 81 mg at 11/18/19 0941 .  atorvastatin (LIPITOR) tablet 80 mg, 80  mg, Oral, Daily, [name], MD, 80 mg at 11/18/19 0941 .  butalbital-acetaminophen-caffeine (FIORICET) 50-325-40 MG per tablet 2 tablet, 2 tablet, Oral, Q6H PRN, 01/19/20, MD, 2 tablet at 11/19/19 0101 .  Chlorhexidine Gluconate Cloth 2 % PADS 6 each, 6 each, Topical, Daily, 01/19/20, MD, 6 each at 11/18/19 1000 .  clevidipine (CLEVIPREX) infusion 0.5 mg/mL, 0-21 mg/hr, Intravenous, Continuous, Farris Geiman A, MD, Last Rate: 42 mL/hr at 11/19/19 0600, 21 mg/hr at 11/19/19 0600 .  clonazePAM (KLONOPIN) tablet 0.5 mg, 0.5 mg, Oral, PRN, 01/19/20, MD, 0.5 mg at 11/18/19 2037 .  escitalopram (LEXAPRO) tablet 10 mg, 10 mg, Oral, Daily, Zakkary Thibault A, MD .  HYDROmorphone (DILAUDID) injection 0.5 mg, 0.5 mg, Intravenous, Q2H PRN, [name], MD, 0.5 mg at 11/19/19 0510 .  meclizine (ANTIVERT) tablet 25 mg, 25 mg, Oral, TID PRN, 2038, MD, 25 mg at 11/16/19 1838 .  mupirocin ointment (BACTROBAN) 2 % 1 application, 1 application, Nasal, BID, 01/20/20, MD, 1 application at 11/18/19 2118 .  ondansetron (ZOFRAN) tablet 4 mg, 4 mg, Oral, Q6H PRN **OR** ondansetron (ZOFRAN) injection 4 mg, 4 mg, Intravenous, Q6H PRN,  [name], MD, 4 mg at 11/18/19 2220 .  oxyCODONE (Oxy IR/ROXICODONE) immediate release tablet 10 mg, 10 mg, Oral, Q4H PRN, [name], MD, 10 mg at 11/19/19 0509 .  oxyCODONE (Oxy IR/ROXICODONE) immediate release tablet 5 mg, 5 mg, Oral, Q4H PRN, [name], MD, 5 mg at 11/17/19 2204 .  potassium chloride SA (KLOR-CON) CR tablet 20 mEq, 20 mEq, Oral, BID, Rinehuls, David L, [name] .  promethazine (PHENERGAN) injection 25 mg, 25 mg, Intravenous, Q6H PRN, [name], MD   Physical Exam: AOx3, FCx4  Assessment & Plan: 49 y.o. man s/p suboccipital craniectomy for PICA stroke w/ hydrocephalus, recovering well. Post-op CTH with decompression, improved ventricular size.  -defer to  primary, from my perspective I'm okay with him moving to stepdown -okay to start [facility]  [name]  11/19/19 6:29 AM

## 2019-11-19 NOTE — Progress Notes (Signed)
   11/19/19 1730 11/19/19 1745  Art Line  Arterial Line BP 187/90 (pt uncomfortable, re-positioned and ice offered for pain) 164/79 (MD notified of SBP>160, new prn orders)  Arterial Line MAP (mmHg) 115 mmHg 101 mmHg   Will administer PRN meds.  Aris Lot, RN

## 2019-11-20 LAB — BASIC METABOLIC PANEL
Anion gap: 11 (ref 5–15)
BUN: 8 mg/dL (ref 6–20)
CO2: 23 mmol/L (ref 22–32)
Calcium: 8.6 mg/dL — ABNORMAL LOW (ref 8.9–10.3)
Chloride: 104 mmol/L (ref 98–111)
Creatinine, Ser: 0.72 mg/dL (ref 0.61–1.24)
GFR calc Af Amer: >60 mL/min (ref >60)
GFR calc non Af Amer: >60 mL/min (ref >60)
Glucose, Bld: 118 mg/dL — ABNORMAL HIGH (ref 70–99)
Potassium: 3.1 mmol/L — ABNORMAL LOW (ref 3.5–5.1)
Sodium: 138 mmol/L (ref 135–145)

## 2019-11-20 LAB — CBC
HCT: 42.2 % (ref 39.0–52.0)
Hemoglobin: 14.8 g/dL (ref 13.0–17.0)
MCH: 32 pg (ref 26.0–34.0)
MCHC: 35.1 g/dL (ref 30.0–36.0)
MCV: 91.1 fL (ref 80.0–100.0)
Platelets: 194 10*3/uL (ref 150–400)
RBC: 4.63 MIL/uL (ref 4.22–5.81)
RDW: 11.7 % (ref 11.5–15.5)
WBC: 9.4 10*3/uL (ref 4.0–10.5)
nRBC: 0 % (ref 0.0–0.2)

## 2019-11-20 LAB — FANA STAINING PATTERNS
Homogeneous Pattern: 1:80 {titer}
Speckled Pattern: 1:80 {titer}

## 2019-11-20 LAB — TRIGLYCERIDES: Triglycerides: 122 mg/dL (ref <150)

## 2019-11-20 LAB — ANTINUCLEAR ANTIBODIES, IFA: ANA Ab, IFA: POSITIVE — AB

## 2019-11-20 MED ORDER — LABETALOL HCL 5 MG/ML IV SOLN
10.0000 mg | INTRAVENOUS | Status: DC | PRN
  Administered 2019-11-20: 10 mg via INTRAVENOUS

## 2019-11-20 MED ORDER — POTASSIUM CHLORIDE CRYS ER 20 MEQ PO TBCR
40.0000 meq | EXTENDED_RELEASE_TABLET | ORAL | Status: AC
  Administered 2019-11-20 (×2): 40 meq via ORAL
  Filled 2019-11-20 (×2): qty 2

## 2019-11-20 MED ORDER — LOSARTAN POTASSIUM 50 MG PO TABS
50.0000 mg | ORAL_TABLET | Freq: Every day | ORAL | Status: DC
  Administered 2019-11-20 – 2019-11-22 (×3): 50 mg via ORAL
  Filled 2019-11-20 (×3): qty 1

## 2019-11-20 MED ORDER — LABETALOL HCL 5 MG/ML IV SOLN: 10.0000 mg | INTRAVENOUS | Status: DC | PRN

## 2019-11-20 NOTE — Progress Notes (Signed)
Physical Therapy Treatment Patient Details Name: [name] MRN: [id number] DOB: [birth year] Today's Date: 11/20/2019    History of Present Illness 49 y.o. male with medical history significant of hypertension, hyperlipidemia, depression/anxiety, degenerative disc disease in lumbar region, obesity presents to emergency department for the evaluation of dizziness. While he was driving on 7/1 he felt dizzy and started having nausea and vomiting and diaphoresis, dizziness is worse with eyes open. MRI demonstrates L PICA infarct. Pt with increased HA and somnolence on 7/2. Repeat head CT demonstrating suboccipital hydrocephalus. Pt underwent suboccipital decompression on 7/2.    PT Comments    Pt tolerates treatment well with increased ambulation distance and transfer attempts. Pt does continue to demonstrate instability with ambulation and is unable to perform head turns or significant eye movement at this time as this causes dizziness and effects his balance. Pt also reporting R knee stiffness during session which can happen at baseline, PT recommends utilizing heat packs and gentle ROM throughout the day to reduce stiffness and improve ROM. Pt will continue to benefit from aggressive mobilization and acute PT services to reduce falls risk and to aide in a return to independent mobility. PT continues to recommend CIR at this time as the pt demonstrates the potential to return to an independent or modI level of mobility.   Follow Up Recommendations  CIR     Equipment Recommendations  Rolling walker with 5" wheels    Recommendations for Other Services       Precautions / Restrictions Precautions Precautions: Fall Precaution Comments: sensitive to light Restrictions Weight Bearing Restrictions: No    Mobility  Bed Mobility Overal bed mobility: Needs Assistance Bed Mobility: Supine to Sit;Sit to Supine     Supine to sit: Supervision Sit to supine: Supervision       Transfers Overall transfer level: Needs assistance Equipment used: Rolling walker (2 wheeled) Transfers: Sit to/from Stand Sit to Stand: Min guard         General transfer comment: requires cues to direct sit onto commode and minG to steady during all transfers  Ambulation/Gait Ambulation/Gait assistance: Mod assist Gait Distance (Feet): 200 Feet Assistive device: Rolling walker (2 wheeled) Gait Pattern/deviations: Step-to pattern;Drifts right/left;Decreased dorsiflexion - right (pt with chronic R foot drop) Gait velocity: reduced Gait velocity interpretation: <1.8 ft/sec, indicate of risk for recurrent falls General Gait Details: pt with multiple losses of balance to left side requiring modA to recover, minA at all times due to lateral instability. Pt reports R knee stiffness and discomfort, possibly leading to an increased L lean   Stairs             Wheelchair Mobility    Modified Rankin (Stroke Patients Only) Modified Rankin (Stroke Patients Only) Pre-Morbid Rankin Score: No symptoms Modified Rankin: Moderately severe disability     Balance Overall balance assessment: Needs assistance Sitting-balance support: No upper extremity supported;Feet supported Sitting balance-Leahy Scale: Fair     Standing balance support: Bilateral upper extremity supported Standing balance-Leahy Scale: Poor Standing balance comment: minG-minA for static standing, reliant on UE support                            Cognition Arousal/Alertness: Awake/alert Behavior During Therapy: WFL for tasks assessed/performed Overall Cognitive Status: Impaired/Different from baseline Area of Impairment: Safety/judgement;Awareness                         Safety/Judgement: Decreased  awareness of safety;Decreased awareness of deficits Awareness: Emergent Problem Solving: Requires verbal cues        Exercises      General Comments General comments (skin integrity,  edema, etc.): VSS on RA      Pertinent Vitals/Pain Pain Assessment: Faces Faces Pain Scale: Hurts whole lot Pain Location: incision site Pain Descriptors / Indicators: Aching Pain Intervention(s): Monitored during session    Home Living                      Prior Function            PT Goals (current goals can now be found in the care plan section) Acute Rehab PT Goals Patient Stated Goal: return to baseline independence Progress towards PT goals: Progressing toward goals    Frequency    Min 4X/week      PT Plan Current plan remains appropriate    Co-evaluation              AM-PAC PT "6 Clicks" Mobility   Outcome Measure  Help needed turning from your back to your side while in a flat bed without using bedrails?: None Help needed moving from lying on your back to sitting on the side of a flat bed without using bedrails?: None Help needed moving to and from a bed to a chair (including a wheelchair)?: A Little Help needed standing up from a chair using your arms (e.g., wheelchair or bedside chair)?: A Little Help needed to walk in hospital room?: A Lot Help needed climbing 3-5 steps with a railing? : Total 6 Click Score: 17    End of Session   Activity Tolerance: Patient tolerated treatment well Patient left: in bed;with call bell/phone within reach;with bed alarm set;with family/visitor present Nurse Communication: Mobility status PT Visit Diagnosis: Unsteadiness on feet (R26.81);Other abnormalities of gait and mobility (R26.89)     Time: 3754-3606 PT Time Calculation (min) (ACUTE ONLY): 33 min  Charges:  $Gait Training: 8-22 mins $Therapeutic Activity: 8-22 mins                     Arlyss Gandy, PT, DPT Acute Rehabilitation Pager: [phone]    Arlyss Gandy 11/20/2019, 2:27 PM

## 2019-11-20 NOTE — Progress Notes (Signed)
Subjective: Patient reports no HA, some neck soreness  Objective: Vital signs in last 24 hours: Temp:  [97.9 F (36.6 C)-100.2 F (37.9 C)] 98.4 F (36.9 C) (07/05 0800) Pulse Rate:  [25-113] 94 (07/05 0930) Resp:  [8-23] 16 (07/05 0930) BP: (103-142)/(59-104) 128/81 (07/05 0930) SpO2:  [79 %-100 %] 98 % (07/05 0930) Arterial Line BP: (118-187)/(50-93) 152/74 (07/05 0930)  Intake/Output from previous day: 07/04 0701 - 07/05 0700 In: 2882.6 [P.O.:120; I.V.:2762.6] Out: 250 [Urine:250] Intake/Output this shift: Total I/O In: 353.1 [P.O.:120; I.V.:233.1] Out: -   Awake, alert, oriented FC x 4 Dressing c/d  Lab Results: Recent Labs    11/19/19 0524 11/20/19 0436  WBC 11.4* 9.4  HGB 14.6 14.8  HCT 42.7 42.2  PLT 203 194   BMET Recent Labs    11/19/19 0524 11/20/19 0436  NA 134* 138  K 3.4* 3.1*  CL 100 104  CO2 25 23  GLUCOSE 135* 118*  BUN 8 8  CREATININE 0.66 0.72  CALCIUM 8.7* 8.6*    Studies/Results: No results found.  Assessment/Plan: S/p suboccipital crani for stroke - cont supportive care - ok for dvt ppx  Bedelia Person 11/20/2019, 10:11 AM

## 2019-11-20 NOTE — Progress Notes (Signed)
Patient continues to refuse CPAP. No CPAP in room. Patient stable on nasal cannula at this time.

## 2019-11-20 NOTE — Consult Note (Signed)
Physical Medicine and Rehabilitation Consult Reason for Consult: Dizziness with gait abnormality Referring Physician: Dr.Xu   HPI: Henry SchwartzMichael J [name] is a 49 y.o. right-handed male with history of hypertension, sleep apnea with intermittent CPAP, hyperlipidemia, depression/anxiety, obesity with BMI 39.68.  Per chart review patient lives with spouse.  He works as a Psychologist, occupationalwelder.  1 level home 3 steps to entry.  Independent prior to admission.  Presented 11/16/2019 with dizziness and gait abnormality.  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  An additional punctate acute infarct question within the left occipital lobe.  CT angiogram of head and neck with no large vessel occlusion or stenosis.  Echocardiogram with ejection fraction of 65% no wall motion abnormalities.   Admission chemistries with potassium 3.3, glucose 189, troponin negative, urinalysis negative nitrite, alcohol negative, lactic acid 5.2, hemoglobin A1c 5.7.  Follow-up CT and imaging due to increasing headaches progressive somnolence showed brainstem compression and obstructive hydrocephalus with patient undergoing suboccipital craniectomy 11/17/2019 per Dr. Johnsie Cancelstergaard.  Neurology follow-up currently maintained on aspirin for CVA prophylaxis.  Subcutaneous Lovenox for DVT prophylaxis.  Patient has been on Cleviprex for blood pressure control.  Tolerating a regular diet.  Therapy evaluations completed with recommendations of physical medicine rehab consult.   Review of Systems  Constitutional: Negative for chills and fever.  HENT: Negative for hearing loss.   Eyes: Negative for blurred vision and double vision.  Respiratory: Negative for cough and shortness of breath.   Cardiovascular: Negative for chest pain, palpitations and leg swelling.  Gastrointestinal: Positive for constipation. Negative for heartburn, nausea and vomiting.  Genitourinary: Negative for dysuria and flank  pain.  Musculoskeletal: Positive for back pain and myalgias.  Skin: Negative for rash.  Neurological: Positive for dizziness and headaches.  All other systems reviewed and are negative.  Past Medical History:  Diagnosis Date  . Arthritis    neck  . Carpal tunnel syndrome on both sides 08/2014  . DDD (degenerative disc disease), lumbar   . History of kidney stones   . Hypertension    under control with med., has been on med. x 1 yr.  . Sleep apnea    CPAP but does not use   Past Surgical History:  Procedure Laterality Date  . CARPAL TUNNEL RELEASE Left 09/07/2014   Procedure: LEFT CARPAL TUNNEL RELEASE;  Surgeon: Dominica SeverinWilliam Gramig, MD;  Location: [facility];  Service: Orthopedics;  Laterality: Left;  . CARPAL TUNNEL RELEASE Right 10/12/2014   Procedure: RIGHT CARPAL TUNNEL RELEASE;  Surgeon: Dominica SeverinWilliam Gramig, MD;  Location: [facility];  Service: Orthopedics;  Laterality: Right;  . KNEE ARTHROSCOPY Left    History reviewed. No pertinent family history. Social History:  reports that he has never smoked. He has never used smokeless tobacco. He reports current alcohol use. He reports that he does not use drugs. Allergies: No Known Allergies Medications Prior to Admission  Medication Sig Dispense Refill  . atorvastatin (LIPITOR) 10 MG tablet Take 10 mg by mouth daily.    . clonazePAM (KLONOPIN) 0.5 MG tablet Take 0.5 mg by mouth as needed (sleep).     Marland Kitchen. escitalopram (LEXAPRO) 10 MG tablet Take 10 mg by mouth daily.    Marland Kitchen. losartan (COZAAR) 50 MG tablet Take 50 mg by mouth daily.    Marland Kitchen. HYDROcodone-acetaminophen (NORCO/VICODIN) 5-325 MG tablet Take 1 tablet by mouth every 4 (four) hours as needed. (Patient not taking: Reported on  07/31/2019) 12 tablet 0  . ibuprofen (ADVIL,MOTRIN) 800 MG tablet Take 1 tablet (800 mg total) by mouth every 8 (eight) hours as needed for moderate pain. (Patient not taking: Reported on 11/16/2019) 15 tablet 0  . ondansetron (ZOFRAN ODT) 4 MG  disintegrating tablet Take 1 tablet (4 mg total) by mouth every 8 (eight) hours as needed for nausea. (Patient not taking: Reported on 07/31/2019) 10 tablet 0  . oxyCODONE-acetaminophen (PERCOCET) 5-325 MG tablet Take 1 tablet by mouth every 4 (four) hours as needed for moderate pain. (Patient not taking: Reported on 07/31/2019) 20 tablet 0    Home: Home Living Family/patient expects to be discharged to:: Private residence Living Arrangements: Spouse/significant other Available Help at Discharge: Family, Available 24 hours/day Type of Home: House Home Access: Stairs to enter [employer] of Steps: 3 Entrance Stairs-Rails: Right Home Layout: One level Bathroom Shower/Tub: Engineer, manufacturing systems: Standard Home Equipment: Environmental consultant - 4 wheels  Functional History: Prior Function Level of Independence: Independent Comments: works as a IT trainer Status:  Mobility: Bed Mobility Overal bed mobility: Needs Assistance Bed Mobility: Supine to Sit Supine to sit: Supervision, HOB elevated Transfers Overall transfer level: Needs assistance Equipment used: Rolling walker (2 wheeled) Transfers: Sit to/from Stand, [employer] Transfers Sit to Stand: Min guard Stand pivot transfers: Mod assist General transfer comment: pt with lateral instability requiring physical assistance to prevent fall Ambulation/Gait Ambulation/Gait assistance: Mod assist Gait Distance (Feet): 4 Feet Assistive device: Rolling walker (2 wheeled) Gait Pattern/deviations: Step-to pattern, Drifts right/left General Gait Details: pt with lateral instability, requires physical assistance to prevent a fall Gait velocity: reduced Gait velocity interpretation: <1.8 ft/sec, indicate of risk for recurrent falls    ADL: ADL Overall ADL's : Needs assistance/impaired Eating/Feeding: Set up, Sitting Grooming: Minimal assistance, Standing, Cueing for safety, Cueing for sequencing Upper Body Bathing:  Minimal assistance, Sitting Lower Body Bathing: Moderate assistance, Sit to/from stand, Sitting/lateral leans, Cueing for sequencing, Cueing for safety Upper Body Dressing : Minimal assistance, Sitting Lower Body Dressing: Moderate assistance, Sit to/from stand, Sitting/lateral leans, Cueing for safety, Cueing for sequencing Toilet Transfer: Moderate assistance, Cueing for safety, RW, Regular Toilet, Grab bars Toileting- Clothing Manipulation and Hygiene: Minimal assistance, Sit to/from stand, Sitting/lateral lean Functional mobility during ADLs: Moderate assistance, Cueing for safety, Cueing for sequencing, Rolling walker  Cognition: Cognition Overall Cognitive Status: Impaired/Different from baseline Orientation Level: Oriented X4 Cognition Arousal/Alertness: Lethargic, Suspect due to medications Behavior During Therapy: Flat affect Overall Cognitive Status: Impaired/Different from baseline Area of Impairment: Memory, Safety/judgement, Awareness, Problem solving Memory: Decreased short-term memory Safety/Judgement: Decreased awareness of deficits Awareness: Emergent Problem Solving: Slow processing, Requires verbal cues General Comments: pt recieved pain medications prior to session, quite drowsy. Noted deficits with problem solving basic mobility tasks  Blood pressure 121/76, pulse 95, temperature 98.9 F (37.2 C), temperature source Oral, resp. rate 16, height 6\' 3"  (1.905 m), weight (!) 144 kg, SpO2 95 %.  Physical Exam General: Alert and oriented x 3, No apparent distress HEENT: Head is normocephalic, atraumatic, PERRLA, EOMI, sclera anicteric, oral mucosa pink and moist, dentition intact, ext ear canals clear,  Neck: Supple without JVD or lymphadenopathy Heart: Reg rate and rhythm. No murmurs rubs or gallops Chest: CTA bilaterally without wheezes, rales, or rhonchi; no distress Abdomen: Soft, non-tender, non-distended, bowel sounds positive. Extremities: No clubbing, cyanosis,  or edema. Pulses are 2+ Skin: Craniectomy site is dressed.  Neuro: Patient is drowsy but arousable.  Makes eye contact with examiner.  Does provide  his name and age but would easily fall back asleep.  Follows simple commands. 5/5 strength throughout except RLE 0/5 DF (chronic as per patient). Sensation is intact. Able to feet oatmeal to himself.  Psych: Pt's affect is appropriate. Pt is cooperative    Results for orders placed or performed during the hospital encounter of 11/16/19 (from the past 24 hour(s))  CBC     Status: None   Collection Time: 11/20/19  4:36 AM  Result Value Ref Range   WBC 9.4 4.0 - 10.5 K/uL   RBC 4.63 4.22 - 5.81 MIL/uL   Hemoglobin 14.8 13.0 - 17.0 g/dL   HCT 16.1 39 - 52 %   MCV 91.1 80.0 - 100.0 fL   MCH 32.0 26.0 - 34.0 pg   MCHC 35.1 30.0 - 36.0 g/dL   RDW 09.6 04.5 - 40.9 %   Platelets 194 150 - 400 K/uL   nRBC 0.0 0.0 - 0.2 %  Basic metabolic panel     Status: Abnormal   Collection Time: 11/20/19  4:36 AM  Result Value Ref Range   Sodium 138 135 - 145 mmol/L   Potassium 3.1 (L) 3.5 - 5.1 mmol/L   Chloride 104 98 - 111 mmol/L   CO2 23 22 - 32 mmol/L   Glucose, Bld 118 (H) 70 - 99 mg/dL   BUN 8 6 - 20 mg/dL   Creatinine, Ser 8.11 0.61 - 1.24 mg/dL   Calcium 8.6 (L) 8.9 - 10.3 mg/dL   GFR calc non Af Amer >60 >60 mL/min   GFR calc Af Amer >60 >60 mL/min   Anion gap 11 5 - 15  Triglycerides     Status: None   Collection Time: 11/20/19  4:36 AM  Result Value Ref Range   Triglycerides 122 <150 mg/dL   No results found.   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 Does the need for close, 24 hr/day medical supervision in concert with the patient's rehab needs make it unreasonable for this patient to be served in a less intensive setting? Yes 2. Co-Morbidities requiring supervision/potential complications: headache, vertigo, R foot drop, tachycardia, post-operative pain, nausea,  hypokalemia 3. Due to bladder management, bowel management, safety, skin/wound care, disease management, medication administration, pain management and patient education, does the patient require 24 hr/day rehab nursing? Yes 4. Does the patient require coordinated care of a physician, rehab nurse, therapy disciplines of PT, OT, SLP to address physical and functional deficits in the context of the above medical diagnosis(es)? Yes Addressing deficits in the following areas: balance, endurance, locomotion, strength, transferring, bowel/bladder control, bathing, dressing, feeding, grooming, toileting, cognition and psychosocial support 5. Can the patient actively participate in an intensive therapy program of at least 3 hrs of therapy per day at least 5 days per week? Yes 6. The potential for patient to make measurable gains while on inpatient rehab is excellent 7. Anticipated functional outcomes upon discharge from inpatient rehab are modified independent  with PT, modified independent with OT, modified independent with SLP. 8. Estimated rehab length of stay to reach the above functional goals is: 10-14 days 9. Anticipated discharge destination: Home 10. Overall Rehab/Functional Prognosis: excellent  RECOMMENDATIONS: This patient's condition is appropriate for continued rehabilitative care in the following setting: CIR Patient has agreed to participate in recommended program. Yes Note that insurance prior authorization may be required for reimbursement for recommended care.  Comment: [name] is an excellent CIR candidate. He lives with his wife.  He will also have the support of his parents. Can consider addition of cold packs to forehead to alleviate headaches.   Mcarthur Rossetti Angiulli, [name] 11/20/2019   I have personally performed a face to face diagnostic evaluation, including, but not limited to relevant history and physical exam findings, of this patient and developed relevant assessment and plan.   Additionally, I have reviewed and concur with the physician assistant's documentation above.  Sula Soda, MD

## 2019-11-20 NOTE — Progress Notes (Signed)
STROKE TEAM PROGRESS NOTE   INTERVAL HISTORY He is sitting up in bed.  His father is at the bedside.  He states his dizziness is improved.  He is able to ambulate with physical therapist and holding onto a walker.  Blood pressure adequately controlled.  No complaints.  Vitals:   11/20/19 0800 11/20/19 0900 11/20/19 0915 11/20/19 0930  BP: 126/83  123/78 128/81  Pulse: 95 94 92 94  Resp: 16 15 17 16   Temp: 98.4 F (36.9 C)     TempSrc: Oral     SpO2: 99% 98% 97% 98%  Weight:      Height:       CBC:  Recent Labs  Lab 11/16/19 0834 11/16/19 1623 11/19/19 0524 11/20/19 0436  WBC 5.3   < > 11.4* 9.4  NEUTROABS 3.1  --   --   --   HGB 15.4   < > 14.6 14.8  HCT 45.6   < > 42.7 42.2  MCV 93.1   < > 93.8 91.1  PLT 219   < > 203 194   < > = values in this interval not displayed.   Basic Metabolic Panel:  Recent Labs  Lab 11/16/19 1623 11/17/19 0305 11/19/19 0524 11/20/19 0436  NA  --    < > 134* 138  K  --    < > 3.4* 3.1*  CL  --    < > 100 104  CO2  --    < > 25 23  GLUCOSE  --    < > 135* 118*  BUN  --    < > 8 8  CREATININE 1.04   < > 0.66 0.72  CALCIUM  --    < > 8.7* 8.6*  MG 1.7  --   --   --   PHOS 2.0*  --   --   --    < > = values in this interval not displayed.    IMAGING past 24 hours No results found.  PHYSICAL EXAM   Obese middle-aged Caucasian male not in distress. . Afebrile. Head is nontraumatic. Neck is supple without bruit.    Cardiac exam no murmur or gallop. Lungs are clear to auscultation. Distal pulses are well felt. Neurological Exam : Drowsy but can be easily aroused.  Oriented to time place and person.  Speech is clear without dysarthria or apraxia.  Extraocular movements are full range with few beats of end gaze nystagmus on lateral gaze bilaterally.  Mild saccadic dysmetria.  Visual fields are full.  Fundi not visualized.  Motor system exam shows symmetric upper and lower extremity strength without focal weakness.  Mild left finger-to-nose  dysmetria.  No need to heel ataxia.  Gait slightly broad-based and ataxic. ASSESSMENT/PLAN [name] is a 49 y.o. male with history of B carpal tunnel syndrome, hypertension, sleep apnea with intermittent CPAP usage, degenerative disc disease of the lumbar spine presenting with dizziness, ataxic gait along with CP.   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 Punctate L occipital infarct. Sinus dz.   CTA head & neck evolving L PICA infarct. No LVO  CT Head - 11/17/19 1231 - stable Left cerebellar infarct   CT Head - 11/17/19 2137 -  interval suboccipital decompressive craniectomy. scattered small SAH w/in posterior fossa. subacute L cerebellar PICA territory infarct. Persistent  partial effacement of the 4th ventricle. Previous lateral and third ventriculomegaly resolved.   2D Echo EF 60 to 65%  TCD Doppler w/ bubble positive for PFO at rest, not able to perform valsalva this time  LE Doppler no DVT  TEE scheduled for 11/21/2019 at 1130 for stroke work up.   UDS - THC  LDL 134  HgbA1c 5.7  Hypercoagulable and autoimmune labs  negative thus far   VTE prophylaxis - Lovenox 40 mg sq daily   No antithrombotic prior to admission, now on aspirin 81 mg daily. plavix 75 on hold given recent OR. Plan DAPT for 3 weeks, then ASA alone.  Therapy recommendations:  CIR. For insurance auth tomorrow. Disposition: Inpatient rehab HA Cytotoxic Cerebral Edema  Has been going on days with acute worsening in hospital   Fioricet and tylenoal PRN  Pt educated on coping with stress and lower anxiety level  Repeat CT stat to rule out hydrocephalus for worsening Headache 11/17/19 1231 - CTH showed brainstem compression and obstructive hydrocephalus.   Suboccipital Craniectomy - [name], MD - 11/17/19 - 7:34 pm  CT Head - 11/17/19 2137 -  interval suboccipital  decompressive craniectomy. scattered small SAH w/in posterior fossa. subacute L cerebellar PICA territory infarct. Persistent partial effacement of the 4th ventricle. Previous lateral and third ventriculomegaly resolved.   PFO  Positive TCD bubble study at rest - spencer degree III  Not able to do valsalva due to elevated ICP and not cooperative due to sleepiness  Could be the cause of current stroke  Will need outpt follow up with Dr. Excell Seltzer to consider PFO closure as outpt  Hypertension  maxed out on cleviprex  Resume home cozaar  D/c aline. Use cuff pressure  Increase SBP goal < 180  . Long-term BP goal normotensive  Hyperlipidemia  Home meds:  lipitor 10  Now on lipitor 80   LDL 134, goal < 70  Continue statin at discharge  Other Stroke Risk Factors  ETOH use, alcohol level <10, advised to drink no more than 2 drink(s) a day2  Substance abuse - UDS:  THC POSITIVE. Patient advised to stop using due to stroke risk.  Morbid Obesity, Body mass index is 39.68 kg/m., recommend weight loss, diet and exercise as appropriate   Family hx stroke (father). Mother died of MI in her early 32s.   Obstructive sleep apnea, does not use CPAP at home. Refuses in hospital. Has on O2  Other Active Problems  CP, resolved - troponin < 2 -> 5  Hypokalemia 3.4->3.1-> supplement -> pending  (also receiving NS IV with 20 meq KCL at 75 cc /hr)    Depression/anxiety on lexapro and klonopin  Lumbar degenerative disc disease, stable  Leukocytosis 9.4 - resolved  Lactic acidosis 5.2->2.0  Lethargic secondary to opioid pain killers mostly.  lowered Oxycodone and stopped the Dilaudid.  avoid NSAIDs due to petechial hemorrhage.      Hospital day # 4  Patient is doing well.  Continue mobilize out of bed and therapy consults.  Hopefully transfer to inpatient rehab over the next few days.  Check TEE tomorrow and if PFO is confirmed may consider endovascular PFO closure as an  outpatient.  Long discussion with the patient, his father and wife who arrived later after rounds and answered questions.  Greater than 50% time during this 35-minute visit was spent on counseling and coordination of care about his embolic stroke discussion about stroke prevention and treatment and answering questions. Delia Heady,  MD To contact Stroke Continuity provider, please refer to [email or web address]. After hours, contact General Neurology

## 2019-11-20 NOTE — Progress Notes (Signed)
Pt continues to refuse CPAP for the night. No distress noted.

## 2019-11-20 NOTE — Progress Notes (Signed)
Inpatient Rehab Admissions Coordinator:   Met with pt and his family at the bedside.  Will need insurance prior authorization for CIR, and insurance company is closed today, so will start this process tomorrow.    Shann Medal, PT, DPT Admissions Coordinator [phone] 11/20/19  11:59 AM

## 2019-11-20 NOTE — Progress Notes (Signed)
    [facility] has been requested to perform a transesophageal echocardiogram on [name] for CVA and possible PFO seen on TTE.  After careful review of history and examination, the risks and benefits of transesophageal echocardiogram have been explained including risks of esophageal damage, perforation (1:10,000 risk), bleeding, pharyngeal hematoma as well as other potential complications associated with conscious sedation including aspiration, arrhythmia, respiratory failure and death. Alternatives to treatment were discussed, questions were answered. Patient is willing to proceed.   Nada Boozer, NP  11/20/2019 9:39 AM

## 2019-11-21 ENCOUNTER — Encounter (HOSPITAL_COMMUNITY): Payer: Self-pay | Admitting: Internal Medicine

## 2019-11-21 ENCOUNTER — Inpatient Hospital Stay (HOSPITAL_COMMUNITY): Payer: 59

## 2019-11-21 ENCOUNTER — Encounter (HOSPITAL_COMMUNITY)
Admission: EM | Disposition: A | Discharge status: Rehabilitation Facility | Payer: Self-pay | Source: Home / Self Care | Attending: Neurology

## 2019-11-21 ENCOUNTER — Inpatient Hospital Stay (HOSPITAL_COMMUNITY): Payer: 59 | Admitting: Certified Registered Nurse Anesthetist

## 2019-11-21 DIAGNOSIS — I639 Cerebral infarction, unspecified: Secondary | ICD-10-CM

## 2019-11-21 HISTORY — PX: TEE WITHOUT CARDIOVERSION: SHX5443

## 2019-11-21 HISTORY — PX: BUBBLE STUDY: SHX6837

## 2019-11-21 LAB — BASIC METABOLIC PANEL
Anion gap: 10 (ref 5–15)
BUN: 10 mg/dL (ref 6–20)
CO2: 26 mmol/L (ref 22–32)
Calcium: 8.7 mg/dL — ABNORMAL LOW (ref 8.9–10.3)
Chloride: 103 mmol/L (ref 98–111)
Creatinine, Ser: 0.95 mg/dL (ref 0.61–1.24)
GFR calc Af Amer: >60 mL/min (ref >60)
GFR calc non Af Amer: >60 mL/min (ref >60)
Glucose, Bld: 97 mg/dL (ref 70–99)
Potassium: 3.7 mmol/L (ref 3.5–5.1)
Sodium: 139 mmol/L (ref 135–145)

## 2019-11-21 LAB — PROTHROMBIN GENE MUTATION

## 2019-11-21 LAB — PROTIME-INR
INR: 1.1 (ref 0.8–1.2)
Prothrombin Time: 13.7 seconds (ref 11.4–15.2)

## 2019-11-21 SURGERY — ECHOCARDIOGRAM, TRANSESOPHAGEAL
Anesthesia: Monitor Anesthesia Care

## 2019-11-21 MED ORDER — CHLORHEXIDINE GLUCONATE CLOTH 2 % EX PADS
6.0000 | MEDICATED_PAD | Freq: Every day | CUTANEOUS | Status: DC
  Administered 2019-11-22: 6 via TOPICAL

## 2019-11-21 MED ORDER — SODIUM CHLORIDE 0.9 % IV SOLN: INTRAVENOUS | Status: DC | PRN

## 2019-11-21 MED ORDER — DIVALPROEX SODIUM ER 500 MG PO TB24
500.0000 mg | ORAL_TABLET | Freq: Every day | ORAL | Status: DC
  Administered 2019-11-22: 500 mg via ORAL
  Filled 2019-11-21: qty 1

## 2019-11-21 MED ORDER — BISACODYL 10 MG RE SUPP: 10.0000 mg | Freq: Every day | RECTAL | Status: DC | PRN

## 2019-11-21 MED ORDER — VALPROATE SODIUM 500 MG/5ML IV SOLN
500.0000 mg | Freq: Once | INTRAVENOUS | Status: AC
  Administered 2019-11-21: 500 mg via INTRAVENOUS
  Filled 2019-11-21 (×2): qty 5

## 2019-11-21 MED ORDER — DOCUSATE SODIUM 100 MG PO CAPS
100.0000 mg | ORAL_CAPSULE | Freq: Two times a day (BID) | ORAL | Status: DC
  Administered 2019-11-21: 100 mg via ORAL
  Filled 2019-11-21: qty 1

## 2019-11-21 MED ORDER — BUTAMBEN-TETRACAINE-BENZOCAINE 2-2-14 % EX AERO
INHALATION_SPRAY | CUTANEOUS | Status: DC | PRN
  Administered 2019-11-21: 2 via TOPICAL

## 2019-11-21 MED ORDER — AMLODIPINE BESYLATE 5 MG PO TABS
5.0000 mg | ORAL_TABLET | Freq: Every day | ORAL | Status: DC
  Administered 2019-11-21 – 2019-11-22 (×2): 5 mg via ORAL
  Filled 2019-11-21 (×2): qty 1

## 2019-11-21 MED ORDER — LACTATED RINGERS IV SOLN: INTRAVENOUS | Status: DC

## 2019-11-21 MED ORDER — PROPOFOL 500 MG/50ML IV EMUL
INTRAVENOUS | Status: DC | PRN
  Administered 2019-11-21: 100 ug/kg/min via INTRAVENOUS
  Administered 2019-11-21: 75 ug/kg/min via INTRAVENOUS

## 2019-11-21 MED ORDER — DIPHENHYDRAMINE HCL 50 MG/ML IJ SOLN
12.5000 mg | Freq: Once | INTRAMUSCULAR | Status: AC
  Administered 2019-11-21: 12.5 mg via INTRAVENOUS
  Filled 2019-11-21: qty 1

## 2019-11-21 MED ORDER — SODIUM CHLORIDE 0.9 % IV SOLN: INTRAVENOUS | Status: DC

## 2019-11-21 NOTE — Progress Notes (Signed)
STROKE TEAM PROGRESS NOTE   INTERVAL HISTORY Wife at bedside. Pt up in chair with therapy. For TEE today.  He still complains of mild dizziness and has some trouble with vision and tends to keep his eyes closed.  Denies significant headache or double vision. blood pressure adequately controlled. Neurological exam is unchanged Vitals:   11/21/19 0600 11/21/19 0700 11/21/19 0800 11/21/19 0900  BP: (!) 158/110 122/78 (!) 142/98 (!) 150/97  Pulse: 80 87 85 78  Resp: 18 12 19 19   Temp:   98.4 F (36.9 C)   TempSrc:   Oral   SpO2: 98% 100% 100% 98%  Weight:      Height:       CBC:  Recent Labs  Lab 11/16/19 0834 11/16/19 1623 11/19/19 0524 11/20/19 0436  WBC 5.3   < > 11.4* 9.4  NEUTROABS 3.1  --   --   --   HGB 15.4   < > 14.6 14.8  HCT 45.6   < > 42.7 42.2  MCV 93.1   < > 93.8 91.1  PLT 219   < > 203 194   < > = values in this interval not displayed.   Basic Metabolic Panel:  Recent Labs  Lab 11/16/19 1623 11/17/19 0305 11/20/19 0436 11/21/19 0539  NA  --    < > 138 139  K  --    < > 3.1* 3.7  CL  --    < > 104 103  CO2  --    < > 23 26  GLUCOSE  --    < > 118* 97  BUN  --    < > 8 10  CREATININE 1.04   < > 0.72 0.95  CALCIUM  --    < > 8.6* 8.7*  MG 1.7  --   --   --   PHOS 2.0*  --   --   --    < > = values in this interval not displayed.    IMAGING past 24 hours No results found.  PHYSICAL EXAM     Obese middle-aged Caucasian male not in distress. . Afebrile. Head is nontraumatic. Neck is supple without bruit.    Cardiac exam no murmur or gallop. Lungs are clear to auscultation. Distal pulses are well felt. Neurological Exam : Drowsy but can be easily aroused.  Oriented to time place and person.  Speech is clear without dysarthria or apraxia.  Extraocular movements are full range with few beats of end gaze nystagmus on lateral gaze bilaterally.  Mild saccadic dysmetria.  Visual fields are full.  Fundi not visualized.  Motor system exam shows symmetric upper  and lower extremity strength without focal weakness.  Mild left finger-to-nose dysmetria.  No need to heel ataxia.  Gait slightly broad-based and ataxic.   ASSESSMENT/PLAN [name] is a 49 y.o. male with history of B carpal tunnel syndrome, hypertension, sleep apnea with intermittent CPAP usage, degenerative disc disease of the lumbar spine presenting with dizziness, ataxic gait along with CP.   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 Punctate L occipital infarct. Sinus dz.   CTA head & neck evolving L PICA infarct. No LVO  CT Head - 11/17/19 1231 - stable Left cerebellar infarct   CT Head - 11/17/19 2137 -  interval suboccipital decompressive craniectomy. scattered small  SAH w/in posterior fossa. subacute L cerebellar PICA territory infarct. Persistent partial effacement of the 4th ventricle. Previous lateral and third ventriculomegaly resolved.   2D Echo EF 60 to 65%  TCD Doppler w/ bubble positive for PFO at rest, not able to perform valsalva this time  LE Doppler no DVT  TEE Highly mobile intra-atrial septum, but no clear PFO or ASD flow seen despite multiple angles/windows. consider OP imaging (cardiac MRI vs. Cardiac CT) to further evaluation anatomy prior to consideration of transcatheter closure.   UDS - THC  LDL 134  HgbA1c 5.7  Hypercoagulable and autoimmune labs  negative thus far   VTE prophylaxis - Lovenox 40 mg sq daily   No antithrombotic prior to admission, now on aspirin 81 mg daily. plavix 75 on hold given recent OR. Plan DAPT for 3 weeks, then ASA alone.  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   Suboccipital Craniectomy - [name], MD - 11/17/19 - 7:34 pm  CT Head - 11/17/19 2137 -  interval suboccipital decompressive craniectomy. scattered small SAH w/in posterior fossa. subacute L cerebellar PICA territory infarct. Persistent partial effacement of the 4th ventricle. Previous lateral and third ventriculomegaly resolved.   Add depacon IV 1 gm followed by 500 ER daily for HA management  Prn tylenol, ultram, oxy  PFO  Positive TCD bubble study at rest - spencer degree III  Not able to do valsalva due to elevated ICP and not cooperative due to sleepiness  Could be the cause of current stroke  Will need outpt follow up with Dr. Excell Seltzer to consider PFO closure as outpt   Hypertension  Treated w/ cleviprex in ICU, now off  Resumed home cozaar  SBP goal < 180   slightly elevated  Added norvasc 5 . Long-term BP goal normotensive  Hyperlipidemia  Home meds:  lipitor 10  Now on lipitor 80   LDL 134, goal < 70  Continue statin at discharge  Other Stroke Risk Factors  ETOH use, alcohol level <10, advised to drink no more than 2 drink(s) a day2  Substance abuse - UDS:  THC POSITIVE. Patient advised to stop using due to stroke risk.  Morbid Obesity, Body mass index is 39.68 kg/m., recommend weight loss, diet and exercise as appropriate   Family hx stroke (father). Mother died of MI in her early 37s.   Obstructive sleep apnea, does not use CPAP at home. Refuses in hospital. Has on O2  Other Active Problems  CP, resolved - troponin < 2 -> 5  Hypokalemia 3.4->3.1-> supplement -> 3.7 (also receiving NS IV with 20 meq KCL at 75 cc /hr)    Depression/anxiety on lexapro and klonopin  Lumbar degenerative disc disease, stable  Leukocytosis 9.4 - resolved  Lactic acidosis 5.2->2.0  Lethargic secondary to  opioid pain killers mostly.  lowered Oxycodone and stopped the Dilaudid.  avoid NSAIDs due to petechial hemorrhage.   Constipation added colace 100 bid + dulcolax supp prn   Hospital day # 5 Continue mobilize out of bed when therapy consults.  Transfer to neurology floor bed if available.  Likely transfer to inpatient rehab over  the next few days.  Check TEE today and if PFO is confirmed will need elective PFO closure after end of his rehab stay. Long discussion with patient and his father and answered questions.  Greater than 50% time during this 35-minute visit was spent on counseling and coordination of care about his embolic stroke and PFO and discussion with care team Delia Heady, MD To contact Stroke Continuity provider, please refer to [email or web address]. After hours, contact General Neurology

## 2019-11-21 NOTE — Anesthesia Preprocedure Evaluation (Addendum)
Anesthesia Evaluation  Patient identified by MRN, date of birth, ID band Patient awake    Reviewed: Allergy & Precautions, NPO status , Patient's Chart, lab work & pertinent test results  Airway Mallampati: III  TM Distance: >3 FB Neck ROM: Full  Mouth opening: Limited Mouth Opening  Dental  (+) Chipped, Dental Advisory Given,    Pulmonary sleep apnea and Continuous Positive Airway Pressure Ventilation ,    Pulmonary exam normal breath sounds clear to auscultation       Cardiovascular hypertension, negative cardio ROS Normal cardiovascular exam Rhythm:Regular Rate:Normal  TTE 11/17/19 1. Left ventricular ejection fraction, by estimation, is 60 to 65%. The left ventricle has normal function. The left ventricle has no regional wall motion abnormalities. There is mild asymmetric left ventricular hypertrophy of the septal segment. Left ventricular diastolic parameters were normal.  2. Right ventricular systolic function is normal. The right ventricular size is normal. Tricuspid regurgitation signal is inadequate for assessing PA pressure.  3. The mitral valve is normal in structure. No evidence of mitral valve regurgitation. No evidence of mitral stenosis.  4. The aortic valve is tricuspid. Aortic valve regurgitation is not visualized. No aortic stenosis is present.  5. Aortic dilatation noted. 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 Sinus of valsalva measures 43 mm, aortic arch appears 40 mm.  6. The inferior vena cava is dilated in size with <50% respiratory variability, suggesting right atrial pressure of 15 mmHg.   Neuro/Psych Anxiety Depression CVA negative psych ROS   GI/Hepatic negative GI ROS, Neg liver ROS,   Endo/Other  Morbid obesity (BMI 41)  Renal/GU negative Renal ROS  negative genitourinary   Musculoskeletal  (+) Arthritis , Osteoarthritis,    Abdominal   Peds  Hematology negative  hematology ROS (+)   Anesthesia Other Findings TEE for CVA workup  Reproductive/Obstetrics                            Anesthesia Physical Anesthesia Plan  ASA: III  Anesthesia Plan: MAC   Post-op Pain Management:    Induction: Intravenous  PONV Risk Score and Plan: 1 and Propofol infusion and Treatment may vary due to age or medical condition  Airway Management Planned: Natural Airway  Additional Equipment:   Intra-op Plan:   Post-operative Plan:   Informed Consent: I have reviewed the patients History and Physical, chart, labs and discussed the procedure including the risks, benefits and alternatives for the proposed anesthesia with the patient or authorized representative who has indicated his/her understanding and acceptance.     Dental advisory given  Plan Discussed with: CRNA  Anesthesia Plan Comments:         Anesthesia Quick Evaluation

## 2019-11-21 NOTE — CV Procedure (Addendum)
    TRANSESOPHAGEAL ECHOCARDIOGRAM   NAME:  [name]   MRN: [id number] DOB:  03-Sep-1970   ADMIT DATE: 11/16/2019  INDICATIONS: CVA  PROCEDURE:   Informed consent was obtained prior to the procedure. The risks, benefits and alternatives for the procedure were discussed and the patient comprehended these risks.  Risks include, but are not limited to, cough, sore throat, vomiting, nausea, somnolence, esophageal and stomach trauma or perforation, bleeding, low blood pressure, aspiration, pneumonia, infection, trauma to the teeth and death.    Procedural time out performed. The oropharynx was anesthetized with topical 1% benzocaine.  Total propofol administered was 402 mg.  During this procedure the patient is administered a total of . Monitored anesthesia care provided under the supervision of Dr. Armond Hang.   The transesophageal probe was inserted in the esophagus and stomach without difficulty and multiple views were obtained.    COMPLICATIONS:    There were no immediate complications.  FINDINGS:  LEFT VENTRICLE: EF = 60-65%. No regional wall motion abnormalities.  RIGHT VENTRICLE: Normal size and function.   LEFT ATRIUM: No thrombus/mass.  LEFT ATRIAL APPENDAGE: No thrombus/mass.   RIGHT ATRIUM: No thrombus/mass.  AORTIC VALVE:  Trileaflet. Trivial regurgitation. No vegetation.  MITRAL VALVE:    Normal structure. Trivial regurgitation. No vegetation.  TRICUSPID VALVE: Normal structure. Trivial regurgitation. No vegetation.  PULMONIC VALVE: Grossly normal structure. Trivial regurgitation. No apparent vegetation.  INTERATRIAL SEPTUM: No PFO or ASD seen by color Doppler. Bubble study rapidly positive for right to left shunting, with small (but definitive) amount of bubble flow seen.  PERICARDIUM: No effusion noted.  DESCENDING AORTA: Mild diffuse plaque seen   CONCLUSION: Study positive for intra-atrial flow communication. Highly mobile intra-atrial septum, but no  clear PFO or ASD flow seen despite multiple angles/windows. Recommend consideration of additional outpatient imaging (cardiac MRI vs. Cardiac CT) to further evaluation anatomy prior to consideration of transcatheter closure.  [name], MD, PhD [facility]  [address] [place], [place] [address] [phone]   12:16 PM

## 2019-11-21 NOTE — Progress Notes (Signed)
Physical Therapy Treatment Patient Details Name: [name] MRN: [id number] DOB: 24-Jan-1971 Today's Date: 11/21/2019    History of Present Illness 49 y.o. male with medical history significant of hypertension, hyperlipidemia, depression/anxiety, degenerative disc disease in lumbar region, obesity presents to emergency department for the evaluation of dizziness. While he was driving on 7/1 he felt dizzy and started having nausea and vomiting and diaphoresis, dizziness is worse with eyes open. MRI demonstrates L PICA infarct. Pt with increased HA and somnolence on 7/2. Repeat head CT demonstrating suboccipital hydrocephalus. Pt underwent suboccipital decompression on 7/2.    PT Comments    Pt received in recliner after having participated in OT session this AM. He was awaiting transport to TEE. Pt assisted to/from bathroom then ambulated in hallway. He required min assist sit to stand and mod assist ambulation 175' with RW. Poor self awareness during mobility requiring multiple verbal cues to stay inside frame of RW and keep it close. Pt holding head in flexed position, looking at floor. He briefly corrects with verbal cues then returns to flexed posture. Pt returned to recliner at end of session.   Follow Up Recommendations  CIR     Equipment Recommendations  Rolling walker with 5" wheels    Recommendations for Other Services       Precautions / Restrictions Precautions Precautions: Fall Precaution Comments: sensitive to light Restrictions Weight Bearing Restrictions: No    Mobility  Bed Mobility Overal bed mobility: Needs Assistance Bed Mobility: Supine to Sit     Supine to sit: Supervision     General bed mobility comments: Pt received in recliner and returned to recliner.  Transfers Overall transfer level: Needs assistance Equipment used: Rolling walker (2 wheeled) Transfers: Sit to/from Stand Sit to Stand: Min assist Stand pivot transfers: Min assist        General transfer comment: cues for hand placement and sequencing  Ambulation/Gait Ambulation/Gait assistance: Mod assist Gait Distance (Feet): 175 Feet Assistive device: Rolling walker (2 wheeled) Gait Pattern/deviations: Step-through pattern;Drifts right/left;Decreased stride length Gait velocity: decreased Gait velocity interpretation: <1.31 ft/sec, indicative of household ambulator General Gait Details: cues to stay inside frame of RW. Assist level varying from min to mod assist. Min assist with level, open hallway. Mod assist turning and maneuvering around obstacles.   Stairs             Wheelchair Mobility    Modified Rankin (Stroke Patients Only) Modified Rankin (Stroke Patients Only) Pre-Morbid Rankin Score: No symptoms Modified Rankin: Moderately severe disability     Balance Overall balance assessment: Needs assistance Sitting-balance support: No upper extremity supported;Feet supported Sitting balance-Leahy Scale: Fair Sitting balance - Comments: supervision for static sitting balance   Standing balance support: Bilateral upper extremity supported;During functional activity Standing balance-Leahy Scale: Poor Standing balance comment: reliant on external support                            Cognition Arousal/Alertness: Awake/alert Behavior During Therapy: Flat affect;Impulsive Overall Cognitive Status: Impaired/Different from baseline Area of Impairment: Safety/judgement;Attention;Awareness;Problem solving                   Current Attention Level: Selective Memory:  (will further assess)   Safety/Judgement: Decreased awareness of safety;Decreased awareness of deficits Awareness: Emergent Problem Solving: Slow processing;Difficulty sequencing;Requires verbal cues General Comments: decreased self awareness      Exercises      General Comments General comments (skin integrity, edema, etc.):  Father and wife present during session.       Pertinent Vitals/Pain Pain Assessment: Faces Pain Score: 7  Faces Pain Scale: Hurts even more Pain Location: incision site Pain Descriptors / Indicators: Grimacing;Guarding;Headache Pain Intervention(s): Monitored during session;Ice applied    Home Living                      Prior Function            PT Goals (current goals can now be found in the care plan section) Acute Rehab PT Goals Patient Stated Goal: return to baseline independence Progress towards PT goals: Progressing toward goals    Frequency    Min 4X/week      PT Plan Current plan remains appropriate    Co-evaluation              AM-PAC PT "6 Clicks" Mobility   Outcome Measure  Help needed turning from your back to your side while in a flat bed without using bedrails?: None Help needed moving from lying on your back to sitting on the side of a flat bed without using bedrails?: None Help needed moving to and from a bed to a chair (including a wheelchair)?: A Little Help needed standing up from a chair using your arms (e.g., wheelchair or bedside chair)?: A Little Help needed to walk in hospital room?: A Lot Help needed climbing 3-5 steps with a railing? : Total 6 Click Score: 17    End of Session Equipment Utilized During Treatment: Gait belt Activity Tolerance: Patient tolerated treatment well Patient left: in chair;with call bell/phone within reach;with chair alarm set;with family/visitor present Nurse Communication: Mobility status PT Visit Diagnosis: Unsteadiness on feet (R26.81);Other abnormalities of gait and mobility (R26.89)     Time: 0037-0488 PT Time Calculation (min) (ACUTE ONLY): 23 min  Charges:  $Gait Training: 23-37 mins                     [name], [place]  Office # [phone] Pager [phone]    [name] 11/21/2019, 11:56 AM

## 2019-11-21 NOTE — Progress Notes (Signed)
Occupational Therapy Treatment Patient Details Name: [name] MRN: [id number] DOB: Apr 01, 1971 Today's Date: 11/21/2019    History of present illness 49 y.o. male with medical history significant of hypertension, hyperlipidemia, depression/anxiety, degenerative disc disease in lumbar region, obesity presents to emergency department for the evaluation of dizziness. While he was driving on 7/1 he felt dizzy and started having nausea and vomiting and diaphoresis, dizziness is worse with eyes open. MRI demonstrates L PICA infarct. Pt with increased HA and somnolence on 7/2. Repeat head CT demonstrating suboccipital hydrocephalus. Pt underwent suboccipital decompression on 7/2.   OT comments  Pt states he isn't sleeping well because of the bed. Completed mobility with mod A @ RW lwhen attempting turns. Poor awareness of self in RW or using RW safely, especially with turns. Mod A for LB ADL. Instability with mobility due to deficits listed below. Pt keeping eyes closed but states he is not light sensitive. States it makes his head feel better when his eyes are closed. Note difficulty with saccades. Asked wife to bring in his glasses. Continue to recommend rehab at [facility] at this time. Will continue to follow acutely.  Follow Up Recommendations  [facility]    Equipment Recommendations  Tub/shower seat    Recommendations for Other Services Rehab consult    Precautions / Restrictions Precautions Precautions: Fall Restrictions Weight Bearing Restrictions: No       Mobility Bed Mobility Overal bed mobility: Needs Assistance Bed Mobility: Supine to Sit     Supine to sit: Supervision        Transfers Overall transfer level: Needs assistance   Transfers: Sit to/from Stand;Stand Pivot Transfers Sit to Stand: Min assist Stand pivot transfers: Mod assist       General transfer comment: dependent on external support; poor awareness of self in RW during mobility    Balance Overall balance  assessment: Needs assistance   Sitting balance-Leahy Scale: Fair       Standing balance-Leahy Scale: Poor                             ADL either performed or assessed with clinical judgement   ADL Overall ADL's : Needs assistance/impaired Eating/Feeding: Set up;Sitting   Grooming: Set up;Sitting   Upper Body Bathing: Set up;Supervision/ safety;Sitting   Lower Body Bathing: Moderate assistance;Cueing for safety;Sit to/from stand   Upper Body Dressing : Minimal assistance;Sitting Upper Body Dressing Details (indicate cue type and reason): limited by neck pain Lower Body Dressing: Moderate assistance;Cueing for safety;Sit to/from stand Lower Body Dressing Details (indicate cue type and reason): able to complete figure four position to donn socks/underwear Toilet Transfer: Cueing for safety;Minimal assistance   Toileting- Clothing Manipulation and Hygiene: Minimal assistance;Cueing for safety;Sit to/from stand       Functional mobility during ADLs: Minimal assistance;Cueing for safety;Rolling walker;Cueing for sequencing General ADL Comments: Cues to open eyes for ADL     Vision   Vision Assessment?: Yes Eye Alignment: Within Functional Limits Ocular Range of Motion: Impaired-to be further tested in functional context Tracking/Visual Pursuits: Decreased smoothness of horizontal tracking;Decreased smoothness of vertical tracking Saccades: Decreased speed of saccadic movement;Impaired - to be further tested in functional context Visual Fields:  (will further assess) Additional Comments: Pt with poor visual attention. When asked to track object, pt would not move eyes but say he was looking at object; saccades are not smooth; questionable nystagmus when looking toward R; attempted to repeat however pt states  it hurts his incision when he moves his eyes   Perception     Praxis      Cognition Arousal/Alertness: Awake/alert Behavior During Therapy: Flat  affect Overall Cognitive Status: Impaired/Different from baseline Area of Impairment: Safety/judgement;Attention;Awareness;Problem solving                   Current Attention Level: Selective Memory:  (will further assess)   Safety/Judgement: Decreased awareness of safety;Decreased awareness of deficits Awareness: Emergent Problem Solving: Slow processing General Comments: pt using RW with mobility; running into objects; poor positoining of self in RW; impulsive        Exercises     Shoulder Instructions       General Comments Father present for session. Wife in at end of sesison. Encouraged pt to spend time OOB in chair as tolerated.     Pertinent Vitals/ Pain       Pain Assessment: 0-10 Pain Score: 7  Pain Location: incision site Pain Descriptors / Indicators: Aching;Discomfort;Grimacing;Guarding;Headache Pain Intervention(s): Limited activity within patient's tolerance;Repositioned;Ice applied  Home Living                                          Prior Functioning/Environment              Frequency  Min 2X/week        Progress Toward Goals  OT Goals(current goals can now be found in the care plan section)  Progress towards OT goals: Progressing toward goals  Acute Rehab OT Goals Patient Stated Goal: return to baseline independence OT Goal Formulation: With patient Time For Goal Achievement: 12/02/19 Potential to Achieve Goals: Good ADL Goals Pt Will Transfer to Toilet: with modified independence;ambulating;regular height toilet Pt Will Perform Tub/Shower Transfer: with modified independence;ambulating;shower seat;rolling walker Additional ADL Goal #1: Pt will recall and implement visual compensatory strategies to identify 3/5 BADL items in R periphery Additional ADL Goal #2: Pt will complete 5-7 step trail making task with min VC's for safe and successful completion of task  Plan Discharge plan remains appropriate     Co-evaluation                 AM-PAC OT "6 Clicks" Daily Activity     Outcome Measure   Help from another person eating meals?: None Help from another person taking care of personal grooming?: A Little Help from another person toileting, which includes using toliet, bedpan, or urinal?: A Little Help from another person bathing (including washing, rinsing, drying)?: A Lot Help from another person to put on and taking off regular upper body clothing?: A Little Help from another person to put on and taking off regular lower body clothing?: A Lot 6 Click Score: 17    End of Session Equipment Utilized During Treatment: Gait belt;Rolling walker  OT Visit Diagnosis: Other abnormalities of gait and mobility (R26.89);Other symptoms and signs involving cognitive function;Other symptoms and signs involving the nervous system (R29.898);Low vision, both eyes (H54.2);Pain Pain - part of body:  (head/neck/incisional)   Activity Tolerance Patient tolerated treatment well   Patient Left in chair;with call bell/phone within reach;with chair alarm set;with family/visitor present   Nurse Communication Mobility status        Time: 4696-2952 OT Time Calculation (min): 35 min  Charges: OT General Charges $OT Visit: 1 Visit OT Treatments $Self Care/Home Management : 23-37 mins  [facility], OT/L  Acute OT Clinical Specialist Acute Rehabilitation Services Pager [phone] Office [phone]    [facility] 11/21/2019, 10:13 AM

## 2019-11-21 NOTE — Progress Notes (Signed)
Neurosurgery Service Progress Note  Subjective: No acute events overnight, no new complaints  Objective: Vitals:   11/21/19 1240 11/21/19 1300 11/21/19 1400 11/21/19 1500  BP: (!) 181/116 (!) 157/106 (!) 171/109   Pulse: 84 73 71 84  Resp: 17 (!) 22 17 11   Temp:      TempSrc:      SpO2: 99% 99% 99% 97%  Weight:      Height:       Temp (24hrs), Avg:98.8 F (37.1 C), Min:98.4 F (36.9 C), Max:99.3 F (37.4 C)  CBC Latest Ref Rng & Units 11/20/2019 11/19/2019 11/18/2019  WBC 4.0 - 10.5 K/uL 9.4 11.4(H) 11.1(H)  Hemoglobin 13.0 - 17.0 g/dL 01/19/2020 15.9 45.8  Hematocrit 39 - 52 % 42.2 42.7 42.2  Platelets 150 - 400 K/uL 194 203 200   BMP Latest Ref Rng & Units 11/21/2019 11/20/2019 11/19/2019  Glucose 70 - 99 mg/dL 97 01/20/2020) 924(M)  BUN 6 - 20 mg/dL 10 8 8   Creatinine 0.61 - 1.24 mg/dL 628(M 3.81  Sodium 135 - 145 mmol/L 139 138 134(L)  Potassium 3.5 - 5.1 mmol/L 3.7 3.1(L) 3.4(L)  Chloride 98 - 111 mmol/L 103 104 100  CO2 22 - 32 mmol/L 26 23 25   Calcium 8.9 - 10.3 mg/dL 7.71) 1.65) )    Intake/Output Summary (Last 24 hours) at 11/21/2019 1555 Last data filed at 11/21/2019 1300 Gross per 24 hour  Intake 1371.84 ml  Output --  Net 1371.84 ml    Current Facility-Administered Medications:  .   stroke: mapping our early stages of recovery book, , Does not apply, Once, 3.3(O, MD .  0.9 %  sodium chloride infusion, , Intravenous, Continuous, 01/22/2020 R, NP .  0.9 % NaCl with KCl 20 mEq/ L  infusion, , Intravenous, Continuous, 01/22/2020, MD, Stopped at 11/21/19 1030 .  acetaminophen (TYLENOL) tablet 650 mg, 650 mg, Oral, Q6H PRN, 650 mg at 11/21/19 1303 **OR** acetaminophen (TYLENOL) suppository 650 mg, 650 mg, Rectal, Q6H PRN, [name], MD .  amLODipine (NORVASC) tablet 5 mg, 5 mg, Oral, Daily, Biby, Sharon L, NP, 5 mg at 11/21/19 1551 .  aspirin EC tablet 81 mg, 81 mg, Oral, Daily, 01/22/20, MD, 81 mg at 11/21/19  1303 .  atorvastatin (LIPITOR) tablet 80 mg, 80 mg, Oral, Daily, 01/22/20, MD, 80 mg at 11/21/19 1303 .  bisacodyl (DULCOLAX) suppository 10 mg, 10 mg, Rectal, Daily PRN, 01/22/20, MD .  butalbital-acetaminophen-caffeine (FIORICET) 50-325-40 MG per tablet 2 tablet, 2 tablet, Oral, Q6H PRN, [name], MD, 2 tablet at 11/20/19 1617 .  clonazePAM (KLONOPIN) tablet 0.5 mg, 0.5 mg, Oral, PRN, [name], MD, 0.5 mg at 11/19/19 1952 .  [COMPLETED] valproate (DEPACON) 500 mg in dextrose 5 % 50 mL IVPB, 500 mg, Intravenous, Once, Last Rate: 55 mL/hr at 11/21/19 1100, Rate Verify at 11/21/19 1100 **FOLLOWED BY** [START ON 11/22/2019] divalproex (DEPAKOTE ER) 24 hr tablet 500 mg, 500 mg, Oral, Daily, 01/22/20, MD .  docusate sodium (COLACE) capsule 100 mg, 100 mg, Oral, BID, 01/22/20, MD, 100 mg at 11/21/19 1303 .  enoxaparin (LOVENOX) injection 40 mg, 40 mg, Subcutaneous, Daily, [name], MD, 40 mg at 11/21/19 1303 .  escitalopram (LEXAPRO) tablet 10 mg, 10 mg, Oral, Daily, 01/22/20, MD, 10 mg at 11/21/19 1302 .  labetalol (NORMODYNE) injection 10 mg, 10 mg, Intravenous, Q4H PRN, 01/22/20, MD, 10 mg at 11/20/19 1842 .  losartan (COZAAR) tablet 50 mg,  50 mg, Oral, Daily, [name], MD, 50 mg at 11/21/19 1303 .  meclizine (ANTIVERT) tablet 25 mg, 25 mg, Oral, TID PRN, [name], MD, 25 mg at 11/19/19 1658 .  MEDLINE mouth rinse, 15 mL, Mouth Rinse, BID, [name], MD, 15 mL at 11/21/19 1000 .  ondansetron (ZOFRAN) tablet 4 mg, 4 mg, Oral, Q6H PRN **OR** ondansetron (ZOFRAN) injection 4 mg, 4 mg, Intravenous, Q6H PRN, [name], MD, 4 mg at 11/18/19 2220 .  oxyCODONE (Oxy IR/ROXICODONE) immediate release tablet 5 mg, 5 mg, Oral, Q4H PRN, [name], MD, 5 mg at 11/21/19 1303 .  promethazine (PHENERGAN) injection 25 mg, 25 mg,  Intravenous, Q6H PRN, [name], MD .  traMADol (ULTRAM) tablet 50 mg, 50 mg, Oral, Q6H PRN, [name], MD, 50 mg at 11/20/19 0030   Physical Exam: AOx3, FCx4  Assessment & Plan: 49 y.o. man s/p suboccipital craniectomy for PICA stroke w/ hydrocephalus, recovering well. Post-op CTH with decompression, improved ventricular size.  -no change in neurosurgical plan of care  [name]  11/21/19 3:55 PM

## 2019-11-21 NOTE — Transfer of Care (Signed)
Immediate Anesthesia Transfer of Care Note  Patient: [name]  Procedure(s) Performed: TRANSESOPHAGEAL ECHOCARDIOGRAM (TEE) (N/A ) BUBBLE STUDY  Patient Location: Endoscopy Unit  Anesthesia Type:MAC  Level of Consciousness: awake and drowsy  Airway & Oxygen Therapy: Patient Spontanous Breathing and Patient connected to nasal cannula oxygen  Post-op Assessment: Report given to RN, Post -op Vital signs reviewed and stable and Patient moving all extremities X 4  Post vital signs: Reviewed and stable  Last Vitals:  Vitals Value Taken Time  BP 172/110 11/21/19 1224  Temp    Pulse 83 11/21/19 1225  Resp 17 11/21/19 1225  SpO2 97 % 11/21/19 1225  Vitals shown include unvalidated device data.  Last Pain:  Vitals:   11/21/19 1106  TempSrc: Temporal  PainSc: 0-No pain      Patients Stated Pain Goal: 4 (03/13/24 3664)  Complications: No complications documented.

## 2019-11-21 NOTE — Interval H&P Note (Signed)
History and Physical Interval Note:  11/21/2019 11:12 AM  [name]  has presented today for surgery, with the diagnosis of STROKE.  The various methods of treatment have been discussed with the patient and family. After consideration of risks, benefits and other options for treatment, the patient has consented to  Procedure(s): TRANSESOPHAGEAL ECHOCARDIOGRAM (TEE) (N/A) as a surgical intervention.  The patient's history has been reviewed, patient examined, no change in status, stable for surgery.  I have reviewed the patient's chart and labs.  Questions were answered to the patient's satisfaction.     Kenneth Cuaresma Cristal Deer

## 2019-11-21 NOTE — Progress Notes (Signed)
Pt doesn't want to wear CPAP for the night. 

## 2019-11-21 NOTE — Anesthesia Procedure Notes (Signed)
Procedure Name: MAC Date/Time: 11/21/2019 11:45 AM Performed by: Harden Mo, CRNA Pre-anesthesia Checklist: Patient identified, Emergency Drugs available, Suction available and Patient being monitored Patient Re-evaluated:Patient Re-evaluated prior to induction Oxygen Delivery Method: Nasal cannula Preoxygenation: Pre-oxygenation with 100% oxygen Induction Type: IV induction Placement Confirmation: positive ETCO2 and breath sounds checked- equal and bilateral Dental Injury: Teeth and Oropharynx as per pre-operative assessment

## 2019-11-21 NOTE — Anesthesia Postprocedure Evaluation (Signed)
Anesthesia Post Note  Patient: [name]  Procedure(s) Performed: TRANSESOPHAGEAL ECHOCARDIOGRAM (TEE) (N/A ) BUBBLE STUDY     Patient location during evaluation: Endoscopy Anesthesia Type: MAC Level of consciousness: awake and alert Pain management: pain level controlled Vital Signs Assessment: post-procedure vital signs reviewed and stable Respiratory status: spontaneous breathing, nonlabored ventilation, respiratory function stable and patient connected to nasal cannula oxygen Cardiovascular status: blood pressure returned to baseline and stable Postop Assessment: no apparent nausea or vomiting Anesthetic complications: no   No complications documented.  Last Vitals:  Vitals:   11/21/19 1230 11/21/19 1240  BP:  (!) 181/116  Pulse: 83 84  Resp: (!) 21 17  Temp:    SpO2: 97% 99%    Last Pain:  Vitals:   11/21/19 1240  TempSrc:   PainSc: 10-Worst pain ever                 Kaegan Hettich L Taggart Prasad

## 2019-11-22 ENCOUNTER — Encounter (HOSPITAL_COMMUNITY): Payer: Self-pay | Admitting: Physical Medicine & Rehabilitation

## 2019-11-22 ENCOUNTER — Inpatient Hospital Stay (HOSPITAL_COMMUNITY)
Admission: RE | Admit: 2019-11-22 | Discharge: 2019-12-06 | DRG: 057 | Disposition: A | Discharge status: Home / Self Care | Payer: 59 | Source: Intra-hospital | Attending: Physical Medicine & Rehabilitation | Admitting: Physical Medicine & Rehabilitation

## 2019-11-22 ENCOUNTER — Inpatient Hospital Stay (HOSPITAL_COMMUNITY): Payer: 59

## 2019-11-22 ENCOUNTER — Other Ambulatory Visit: Payer: Self-pay

## 2019-11-22 DIAGNOSIS — R519 Headache, unspecified: Secondary | ICD-10-CM | POA: Diagnosis present

## 2019-11-22 DIAGNOSIS — Q211 Atrial septal defect: Secondary | ICD-10-CM | POA: Diagnosis not present

## 2019-11-22 DIAGNOSIS — Z6839 Body mass index (BMI) 39.0-39.9, adult: Secondary | ICD-10-CM | POA: Diagnosis not present

## 2019-11-22 DIAGNOSIS — F419 Anxiety disorder, unspecified: Secondary | ICD-10-CM | POA: Diagnosis present

## 2019-11-22 DIAGNOSIS — G441 Vascular headache, not elsewhere classified: Secondary | ICD-10-CM

## 2019-11-22 DIAGNOSIS — G4730 Sleep apnea, unspecified: Secondary | ICD-10-CM | POA: Diagnosis present

## 2019-11-22 DIAGNOSIS — G4733 Obstructive sleep apnea (adult) (pediatric): Secondary | ICD-10-CM | POA: Diagnosis present

## 2019-11-22 DIAGNOSIS — R270 Ataxia, unspecified: Secondary | ICD-10-CM | POA: Diagnosis present

## 2019-11-22 DIAGNOSIS — R7401 Elevation of levels of liver transaminase levels: Secondary | ICD-10-CM

## 2019-11-22 DIAGNOSIS — F418 Other specified anxiety disorders: Secondary | ICD-10-CM | POA: Diagnosis present

## 2019-11-22 DIAGNOSIS — I639 Cerebral infarction, unspecified: Secondary | ICD-10-CM | POA: Diagnosis present

## 2019-11-22 DIAGNOSIS — Z79899 Other long term (current) drug therapy: Secondary | ICD-10-CM

## 2019-11-22 DIAGNOSIS — I69351 Hemiplegia and hemiparesis following cerebral infarction affecting right dominant side: Principal | ICD-10-CM

## 2019-11-22 DIAGNOSIS — R079 Chest pain, unspecified: Secondary | ICD-10-CM | POA: Diagnosis present

## 2019-11-22 DIAGNOSIS — M21371 Foot drop, right foot: Secondary | ICD-10-CM | POA: Diagnosis present

## 2019-11-22 DIAGNOSIS — E669 Obesity, unspecified: Secondary | ICD-10-CM | POA: Diagnosis present

## 2019-11-22 DIAGNOSIS — Q2112 Patent foramen ovale: Secondary | ICD-10-CM

## 2019-11-22 DIAGNOSIS — G4700 Insomnia, unspecified: Secondary | ICD-10-CM | POA: Diagnosis present

## 2019-11-22 DIAGNOSIS — G936 Cerebral edema: Secondary | ICD-10-CM

## 2019-11-22 DIAGNOSIS — F329 Major depressive disorder, single episode, unspecified: Secondary | ICD-10-CM | POA: Diagnosis present

## 2019-11-22 DIAGNOSIS — I63531 Cerebral infarction due to unspecified occlusion or stenosis of right posterior cerebral artery: Secondary | ICD-10-CM | POA: Diagnosis present

## 2019-11-22 DIAGNOSIS — I10 Essential (primary) hypertension: Secondary | ICD-10-CM

## 2019-11-22 DIAGNOSIS — E785 Hyperlipidemia, unspecified: Secondary | ICD-10-CM | POA: Diagnosis present

## 2019-11-22 DIAGNOSIS — R509 Fever, unspecified: Secondary | ICD-10-CM

## 2019-11-22 LAB — URINALYSIS, ROUTINE W REFLEX MICROSCOPIC
Bilirubin Urine: NEGATIVE
Glucose, UA: NEGATIVE mg/dL
Hgb urine dipstick: NEGATIVE
Ketones, ur: NEGATIVE mg/dL
Leukocytes,Ua: NEGATIVE
Nitrite: NEGATIVE
Protein, ur: NEGATIVE mg/dL
Specific Gravity, Urine: 1.014 (ref 1.005–1.030)
pH: 6 (ref 5.0–8.0)

## 2019-11-22 LAB — FACTOR 5 LEIDEN

## 2019-11-22 IMAGING — DX DG CHEST 1V PORT
1 series · 2 of 2 positions shown · non-contrast
Comparison: [DATE]

CLINICAL DATA: Fevers

EXAM:
PORTABLE CHEST 1 VIEW

[Series 1: chest · 0.14mm/px · 2 of 2 slices shown]
[im 1/2]
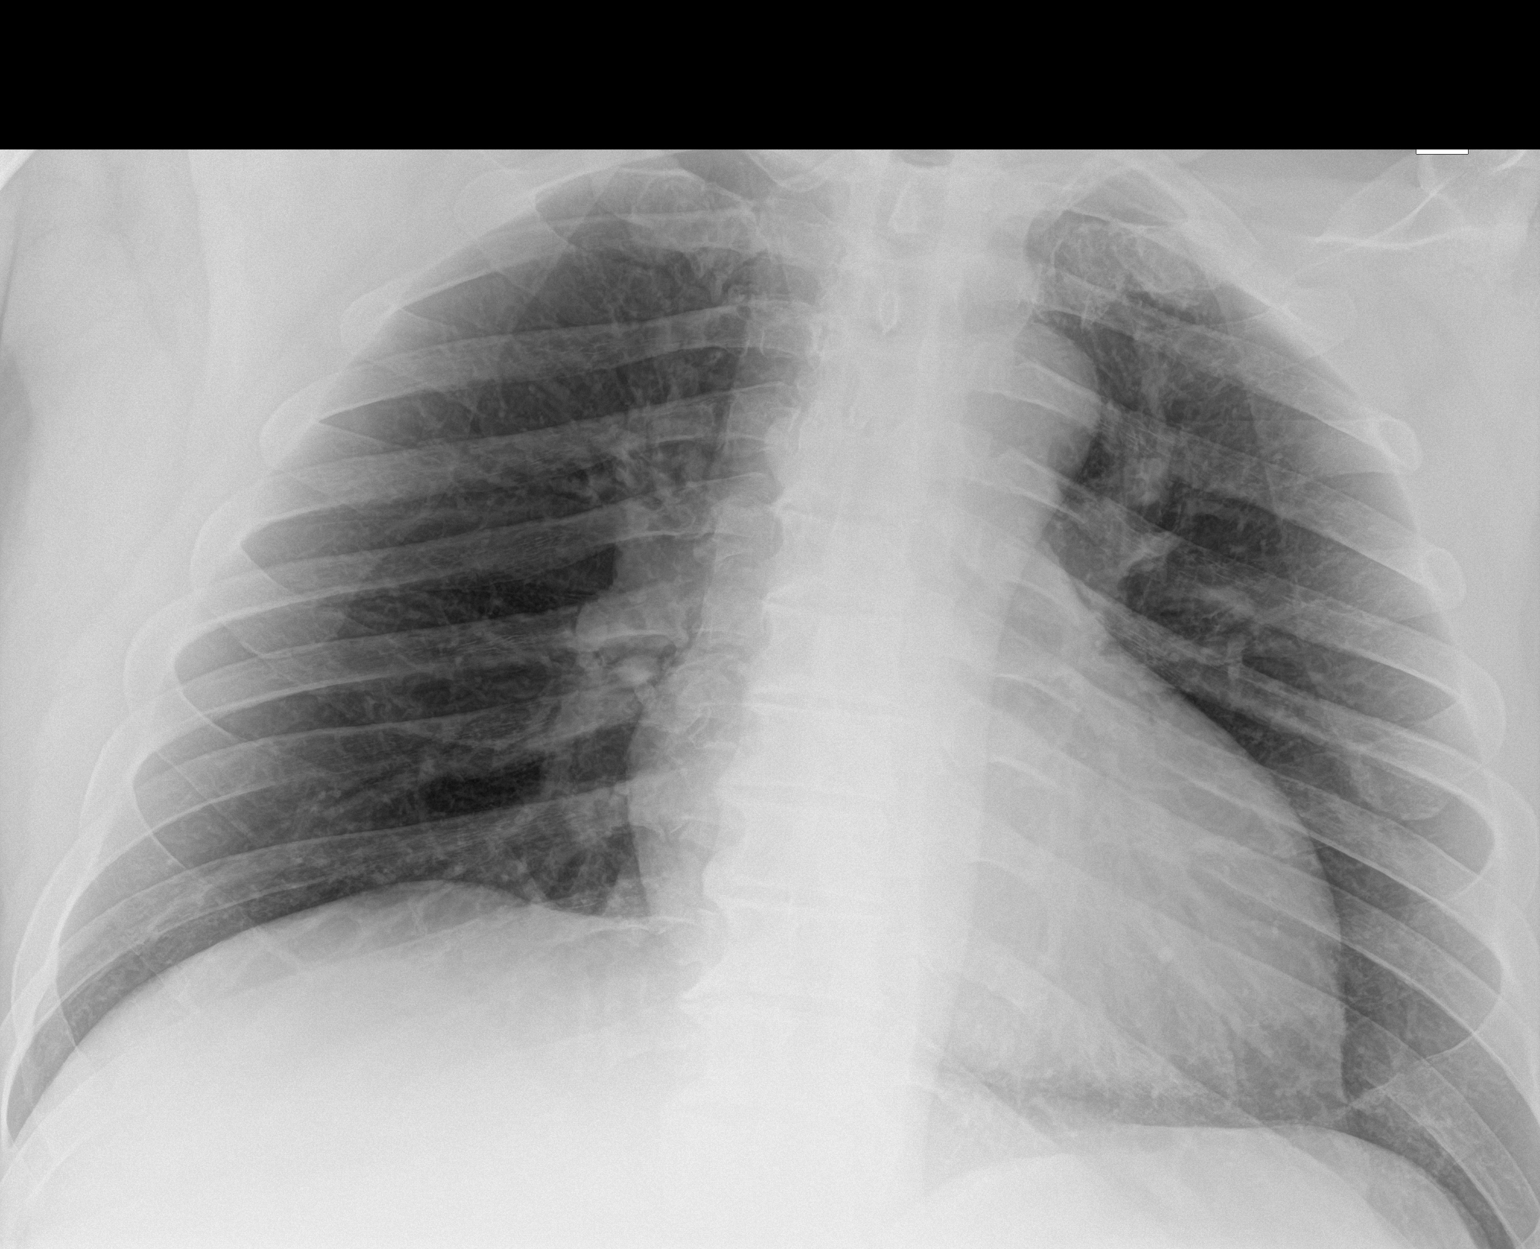
[im 2/2]
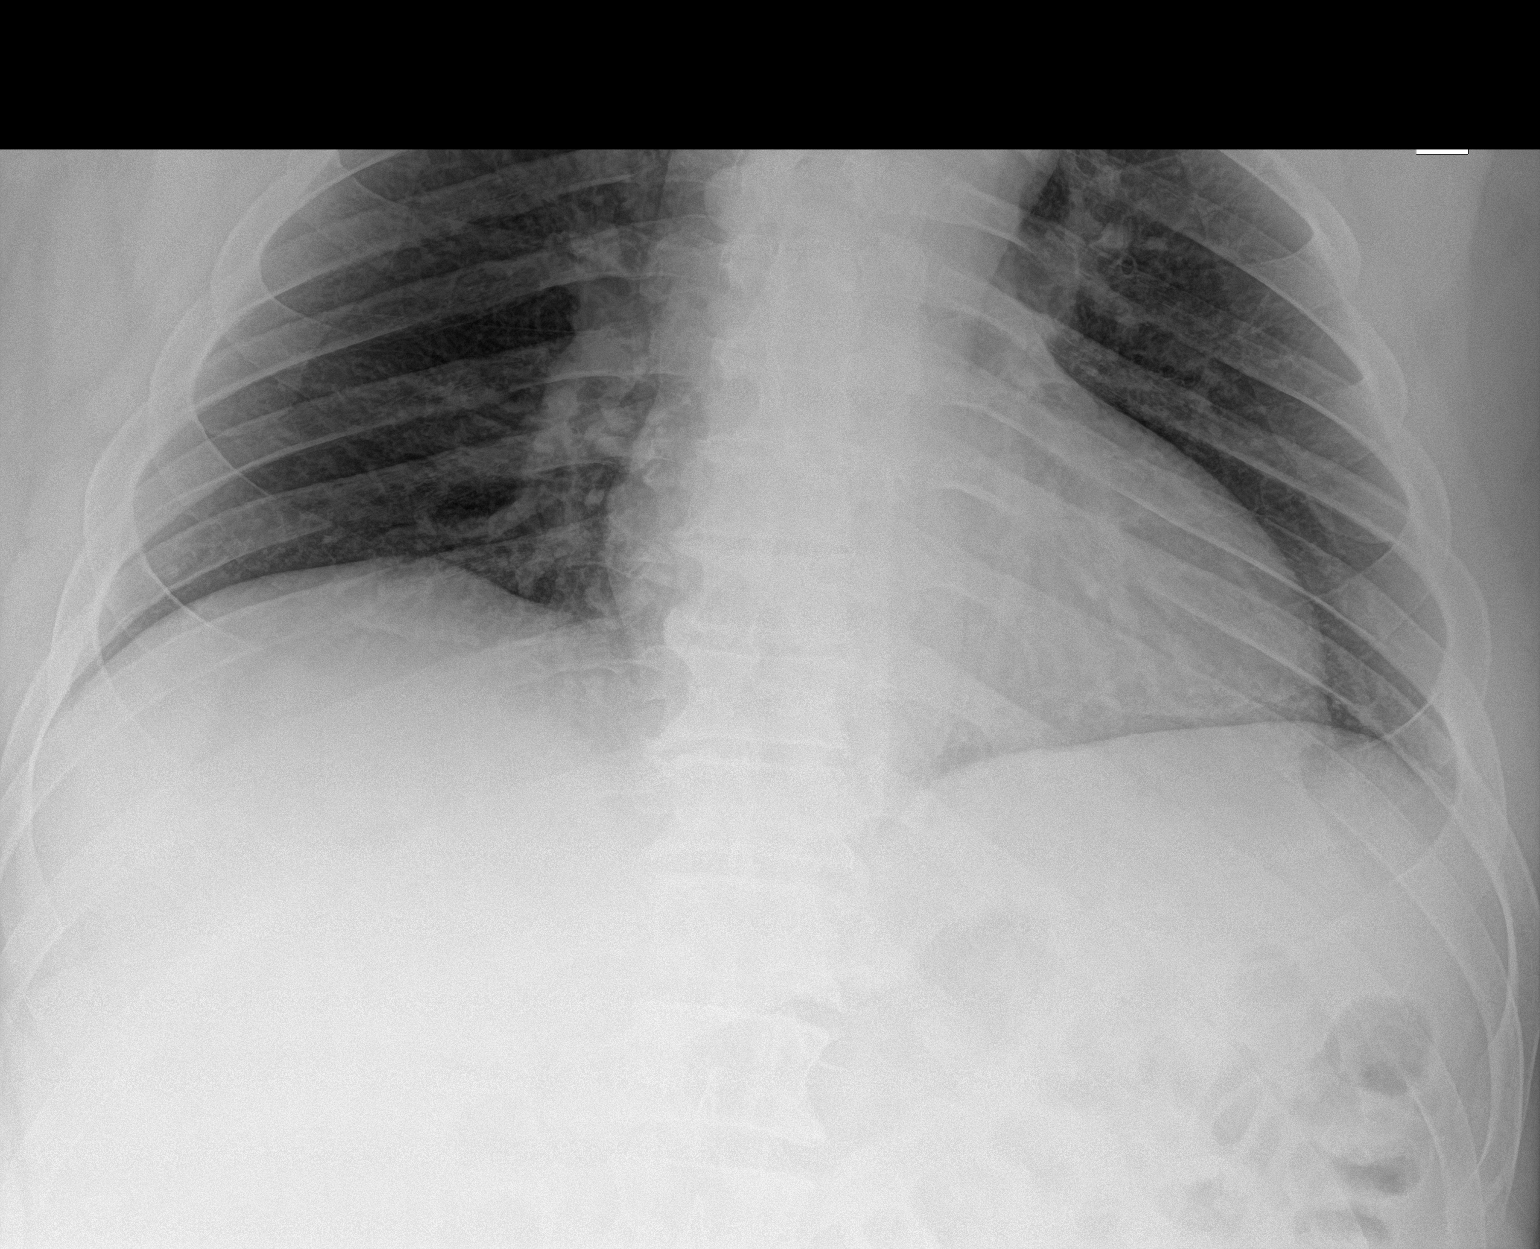

[2 of 2 positions shown; findings below may reference images not displayed]

FINDINGS: Cardiac shadow is enlarged but accentuated by the portable
technique. The lungs are clear bilaterally. No focal infiltrate or
sizable effusion is noted. No bony abnormality is noted.
IMPRESSION: No acute abnormality noted.

## 2019-11-22 MED ORDER — CLOPIDOGREL BISULFATE 75 MG PO TABS
75.0000 mg | ORAL_TABLET | Freq: Every day | ORAL | Status: DC
  Administered 2019-11-23 – 2019-12-06 (×14): 75 mg via ORAL
  Filled 2019-11-22 (×14): qty 1

## 2019-11-22 MED ORDER — ONDANSETRON HCL 4 MG/2ML IJ SOLN: 4.0000 mg | Freq: Four times a day (QID) | INTRAMUSCULAR | Status: DC | PRN

## 2019-11-22 MED ORDER — CLOPIDOGREL BISULFATE 75 MG PO TABS
75.0000 mg | ORAL_TABLET | Freq: Every day | ORAL | Status: DC
  Administered 2019-11-22: 75 mg via ORAL
  Filled 2019-11-22: qty 1

## 2019-11-22 MED ORDER — ACETAMINOPHEN 325 MG PO TABS
650.0000 mg | ORAL_TABLET | Freq: Four times a day (QID) | ORAL | Status: DC | PRN
  Administered 2019-11-22: 650 mg via ORAL
  Filled 2019-11-22: qty 2

## 2019-11-22 MED ORDER — TRAMADOL HCL 50 MG PO TABS: 50.0000 mg | ORAL_TABLET | Freq: Four times a day (QID) | ORAL | Status: DC | PRN

## 2019-11-22 MED ORDER — BISACODYL 10 MG RE SUPP: 10.0000 mg | Freq: Every day | RECTAL | 0 refills | Status: DC | PRN

## 2019-11-22 MED ORDER — DIVALPROEX SODIUM ER 500 MG PO TB24
500.0000 mg | ORAL_TABLET | Freq: Every day | ORAL | Status: DC
  Administered 2019-11-23 – 2019-11-25 (×3): 500 mg via ORAL
  Filled 2019-11-22 (×3): qty 1

## 2019-11-22 MED ORDER — OXYCODONE HCL 5 MG PO TABS: 5.0000 mg | ORAL_TABLET | ORAL | 0 refills | Status: DC | PRN

## 2019-11-22 MED ORDER — ONDANSETRON HCL 4 MG PO TABS
4.0000 mg | ORAL_TABLET | Freq: Four times a day (QID) | ORAL | Status: DC | PRN
  Administered 2019-11-25 – 2019-12-02 (×2): 4 mg via ORAL
  Filled 2019-11-22 (×2): qty 1

## 2019-11-22 MED ORDER — ASPIRIN 81 MG PO TBEC: 81.0000 mg | DELAYED_RELEASE_TABLET | Freq: Every day | ORAL | 11 refills | Status: DC

## 2019-11-22 MED ORDER — ENOXAPARIN SODIUM 40 MG/0.4ML ~~LOC~~ SOLN: 40.0000 mg | SUBCUTANEOUS | Status: DC

## 2019-11-22 MED ORDER — ENOXAPARIN SODIUM 40 MG/0.4ML ~~LOC~~ SOLN
40.0000 mg | Freq: Every day | SUBCUTANEOUS | Status: DC
  Administered 2019-11-23 – 2019-11-27 (×5): 40 mg via SUBCUTANEOUS
  Filled 2019-11-22 (×5): qty 0.4

## 2019-11-22 MED ORDER — CLONAZEPAM 0.5 MG PO TABS
0.5000 mg | ORAL_TABLET | ORAL | Status: DC | PRN
  Administered 2019-11-22 – 2019-12-02 (×9): 0.5 mg via ORAL
  Filled 2019-11-22 (×9): qty 1

## 2019-11-22 MED ORDER — OXYCODONE HCL 5 MG PO TABS
5.0000 mg | ORAL_TABLET | ORAL | Status: DC | PRN
  Administered 2019-11-22 – 2019-11-24 (×4): 5 mg via ORAL
  Filled 2019-11-22 (×4): qty 1

## 2019-11-22 MED ORDER — ATORVASTATIN CALCIUM 80 MG PO TABS: 80.0000 mg | ORAL_TABLET | Freq: Every day | ORAL | Status: DC

## 2019-11-22 MED ORDER — BUTALBITAL-APAP-CAFFEINE 50-325-40 MG PO TABS
2.0000 | ORAL_TABLET | Freq: Four times a day (QID) | ORAL | Status: DC | PRN
  Administered 2019-11-23 – 2019-12-06 (×35): 2 via ORAL
  Filled 2019-11-22 (×40): qty 2

## 2019-11-22 MED ORDER — AMLODIPINE BESYLATE 5 MG PO TABS
5.0000 mg | ORAL_TABLET | Freq: Every day | ORAL | Status: DC
  Administered 2019-11-23 – 2019-11-26 (×4): 5 mg via ORAL
  Filled 2019-11-22 (×4): qty 1

## 2019-11-22 MED ORDER — SORBITOL 70 % SOLN
30.0000 mL | Freq: Every day | Status: DC | PRN
  Administered 2019-11-28: 30 mL via ORAL
  Filled 2019-11-22 (×2): qty 30

## 2019-11-22 MED ORDER — ASPIRIN EC 81 MG PO TBEC
81.0000 mg | DELAYED_RELEASE_TABLET | Freq: Every day | ORAL | Status: DC
  Administered 2019-11-23 – 2019-12-06 (×14): 81 mg via ORAL
  Filled 2019-11-22 (×14): qty 1

## 2019-11-22 MED ORDER — ENOXAPARIN SODIUM 40 MG/0.4ML ~~LOC~~ SOLN: 40.0000 mg | Freq: Every day | SUBCUTANEOUS | Status: DC

## 2019-11-22 MED ORDER — ORAL CARE MOUTH RINSE: 15.0000 mL | Freq: Two times a day (BID) | OROMUCOSAL | 0 refills | Status: DC

## 2019-11-22 MED ORDER — AMLODIPINE BESYLATE 5 MG PO TABS: 5.0000 mg | ORAL_TABLET | Freq: Every day | ORAL | Status: DC

## 2019-11-22 MED ORDER — ATORVASTATIN CALCIUM 80 MG PO TABS
80.0000 mg | ORAL_TABLET | Freq: Every day | ORAL | Status: DC
  Administered 2019-11-23 – 2019-12-06 (×14): 80 mg via ORAL
  Filled 2019-11-22 (×14): qty 1

## 2019-11-22 MED ORDER — LOSARTAN POTASSIUM 50 MG PO TABS
50.0000 mg | ORAL_TABLET | Freq: Every day | ORAL | Status: DC
  Administered 2019-11-23 – 2019-11-28 (×6): 50 mg via ORAL
  Filled 2019-11-22 (×6): qty 1

## 2019-11-22 MED ORDER — DOCUSATE SODIUM 100 MG PO CAPS
100.0000 mg | ORAL_CAPSULE | Freq: Two times a day (BID) | ORAL | Status: DC
  Administered 2019-11-22 – 2019-12-06 (×26): 100 mg via ORAL
  Filled 2019-11-22 (×28): qty 1

## 2019-11-22 MED ORDER — CLOPIDOGREL BISULFATE 75 MG PO TABS: 75.0000 mg | ORAL_TABLET | Freq: Every day | ORAL | Status: DC

## 2019-11-22 MED ORDER — BUTALBITAL-APAP-CAFFEINE 50-325-40 MG PO TABS: 2.0000 | ORAL_TABLET | Freq: Four times a day (QID) | ORAL | 0 refills | Status: DC | PRN

## 2019-11-22 MED ORDER — DOCUSATE SODIUM 100 MG PO CAPS: 100.0000 mg | ORAL_CAPSULE | Freq: Two times a day (BID) | ORAL | 0 refills | Status: DC

## 2019-11-22 MED ORDER — DIVALPROEX SODIUM ER 500 MG PO TB24: 500.0000 mg | ORAL_TABLET | Freq: Every day | ORAL | Status: DC

## 2019-11-22 MED ORDER — TRAMADOL HCL 50 MG PO TABS
50.0000 mg | ORAL_TABLET | Freq: Four times a day (QID) | ORAL | Status: DC | PRN
  Administered 2019-11-22 – 2019-11-24 (×2): 50 mg via ORAL
  Filled 2019-11-22 (×3): qty 1

## 2019-11-22 MED ORDER — MECLIZINE HCL 25 MG PO TABS
25.0000 mg | ORAL_TABLET | Freq: Three times a day (TID) | ORAL | Status: DC | PRN
  Administered 2019-12-01 – 2019-12-02 (×2): 25 mg via ORAL
  Filled 2019-11-22 (×2): qty 1

## 2019-11-22 MED ORDER — ESCITALOPRAM OXALATE 10 MG PO TABS
10.0000 mg | ORAL_TABLET | Freq: Every day | ORAL | Status: DC
  Administered 2019-11-25 – 2019-11-29 (×2): 10 mg via ORAL
  Filled 2019-11-22 (×15): qty 1

## 2019-11-22 MED ORDER — SODIUM CHLORIDE 0.9 % IV SOLN: 20.0000 mL | INTRAVENOUS | 0 refills | Status: DC

## 2019-11-22 MED ORDER — ACETAMINOPHEN 650 MG RE SUPP: 650.0000 mg | Freq: Four times a day (QID) | RECTAL | Status: DC | PRN

## 2019-11-22 NOTE — H&P (Signed)
Physical Medicine and Rehabilitation Admission H&P    Chief Complaint  Patient presents with  . Chest Pain  : HPI: [name] is a 49 year old right-handed male history of hypertension, sleep apnea with intermittent CPAP, hyperlipidemia, depression with anxiety, obesity with BMI 39.68.  History taken from chart review, family, and patient due to lethargy mentation.  Patient lives with spouse.  He works as a Psychologist, occupational.  1 level home 3 steps to entry.  He presented on 11/16/2019 with dizziness and gait abnormality.  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  Additional punctate acute infarct question within the left occipital lobe.  CT angiogram of head and neck negative for large vessel occlusion or stenosis.  Echocardiogram with ejection fraction of 65%, normal wall abnormalities.  TCD Doppler with bubble positive for PFO at rest.  TEE highly mobile intra-atrial septum.  But no clear PFO or ASD flow seen despite multiple angle windows.  Admission chemistries potassium 3.3, glucose 189 troponin negative alcohol negative lactic acid 5.2.  Hospital course further complicated by increasing headaches and lethargy.  Follow-up CT imaging showed brainstem compression and obstructive hydrocephalus with patient undergoing suboccipital craniotomy on 11/17/2019 per Dr. Johnsie Cancel.  Neurology follow-up and patient was cleared to begin aspirin and Plavix for CVA prophylaxis x3 weeks and aspirin alone.  Patient would follow-up Dr. Excell Seltzer to consider PFO closure as an outpatient.  Patient was cleared to begin subcutaneous Lovenox for DVT prophylaxis.  Maintained on Depakote for persistent headaches.  Close monitoring of blood pressure initially on Cleviprex.  Tolerating a regular diet.  Hospital course further complicated by leukocytosis. Patient was admitted for a comprehensive rehab program.  Please see preadmission assessment from earlier today as  well.  Review of Systems  Constitutional: Positive for malaise/fatigue. Negative for chills and fever.  HENT: Negative for hearing loss.   Eyes: Negative for blurred vision and double vision.  Respiratory: Negative for cough and shortness of breath.   Cardiovascular: Negative for chest pain, palpitations and leg swelling.  Gastrointestinal: Positive for constipation. Negative for heartburn, nausea and vomiting.  Genitourinary: Negative for dysuria, flank pain and hematuria.  Musculoskeletal: Positive for back pain and myalgias.  Skin: Negative for rash.  Neurological: Positive for dizziness, weakness and headaches. Negative for sensory change.  Psychiatric/Behavioral: Positive for depression.       Anxiety  All other systems reviewed and are negative.  Past Medical History:  Diagnosis Date  . Arthritis    neck  . Carpal tunnel syndrome on both sides 08/2014  . DDD (degenerative disc disease), lumbar   . History of kidney stones   . Hypertension    under control with med., has been on med. x 1 yr.  . Sleep apnea    CPAP but does not use   Past Surgical History:  Procedure Laterality Date  . CARPAL TUNNEL RELEASE Left 09/07/2014   Procedure: LEFT CARPAL TUNNEL RELEASE;  Surgeon: [name], MD;  Location: [facility];  Service: Orthopedics;  Laterality: Left;  . CARPAL TUNNEL RELEASE Right 10/12/2014   Procedure: RIGHT CARPAL TUNNEL RELEASE;  Surgeon: [name], MD;  Location: [facility];  Service: Orthopedics;  Laterality: Right;  . KNEE ARTHROSCOPY Left   . SUBOCCIPITAL CRANIECTOMY CERVICAL LAMINECTOMY N/A 11/17/2019   Procedure: SUBOCCIPITAL DECOMPRESSIVE CRANIECTOMY;  Surgeon: [name], MD;  Location: [facility] OR;  Service: Neurosurgery;  Laterality: N/A;   History reviewed. No  pertinent family history. Social History:  reports that he has never smoked. He has never used smokeless tobacco. He reports previous alcohol use. He reports that  he does not use drugs. Allergies: No Known Allergies Medications Prior to Admission  Medication Sig Dispense Refill  . atorvastatin (LIPITOR) 10 MG tablet Take 10 mg by mouth daily.    . clonazePAM (KLONOPIN) 0.5 MG tablet Take 0.5 mg by mouth as needed (sleep).     Marland Kitchen escitalopram (LEXAPRO) 10 MG tablet Take 10 mg by mouth daily.    Marland Kitchen losartan (COZAAR) 50 MG tablet Take 50 mg by mouth daily.    Marland Kitchen HYDROcodone-acetaminophen (NORCO/VICODIN) 5-325 MG tablet Take 1 tablet by mouth every 4 (four) hours as needed. (Patient not taking: Reported on 07/31/2019) 12 tablet 0  . ibuprofen (ADVIL,MOTRIN) 800 MG tablet Take 1 tablet (800 mg total) by mouth every 8 (eight) hours as needed for moderate pain. (Patient not taking: Reported on 11/16/2019) 15 tablet 0  . ondansetron (ZOFRAN ODT) 4 MG disintegrating tablet Take 1 tablet (4 mg total) by mouth every 8 (eight) hours as needed for nausea. (Patient not taking: Reported on 07/31/2019) 10 tablet 0  . oxyCODONE-acetaminophen (PERCOCET) 5-325 MG tablet Take 1 tablet by mouth every 4 (four) hours as needed for moderate pain. (Patient not taking: Reported on 07/31/2019) 20 tablet 0    Drug Regimen Review Drug regimen was reviewed and remains appropriate with no significant issues identified  Home: Home Living Family/patient expects to be discharged to:: Private residence Living Arrangements: Spouse/significant other Available Help at Discharge: Family, Available 24 hours/day Type of Home: House Home Access: Stairs to enter [employer] of Steps: 3 Entrance Stairs-Rails: Right Home Layout: One level Bathroom Shower/Tub: Engineer, manufacturing systems: Standard Home Equipment: Environmental consultant - 4 wheels   Functional History: Prior Function Level of Independence: Independent Comments: works as a Media planner Status:  Mobility: Bed Mobility Overal bed mobility: Needs Assistance Bed Mobility: Supine to Sit Supine to sit: Supervision Sit to  supine: Supervision General bed mobility comments: Pt received in recliner and returned to recliner. Transfers Overall transfer level: Needs assistance Equipment used: Rolling walker (2 wheeled) Transfers: Sit to/from Stand Sit to Stand: Min assist Stand pivot transfers: Min assist General transfer comment: pt utilizing railing in bathroom to pull up into standing, instability noted both anterior-posterior and laterally Ambulation/Gait Ambulation/Gait assistance: Mod assist Gait Distance (Feet): 150 Feet Assistive device: Rolling walker (2 wheeled) Gait Pattern/deviations: Step-to pattern General Gait Details: pt with short step to gait, instability noted in all directions, pt does demonstrate improved awareness of instability, stopping ambulation and dual-task activities multiple times to regain balance Gait velocity: reduced Gait velocity interpretation: <1.8 ft/sec, indicate of risk for recurrent falls    ADL: ADL Overall ADL's : Needs assistance/impaired Eating/Feeding: Set up, Sitting Grooming: Set up, Sitting Upper Body Bathing: Set up, Supervision/ safety, Sitting Lower Body Bathing: Moderate assistance, Cueing for safety, Sit to/from stand Upper Body Dressing : Minimal assistance, Sitting Upper Body Dressing Details (indicate cue type and reason): limited by neck pain Lower Body Dressing: Moderate assistance, Cueing for safety, Sit to/from stand Lower Body Dressing Details (indicate cue type and reason): able to complete figure four position to donn socks/underwear Toilet Transfer: Cueing for safety, Minimal assistance Toileting- Clothing Manipulation and Hygiene: Minimal assistance, Cueing for safety, Sit to/from stand Functional mobility during ADLs: Minimal assistance, Cueing for safety, Rolling walker, Cueing for sequencing General ADL Comments: Cues to open eyes for ADL  Cognition: Cognition Overall Cognitive Status: Impaired/Different from baseline Orientation  Level: Oriented X4 Cognition Arousal/Alertness: Awake/alert Behavior During Therapy: Flat affect Overall Cognitive Status: Impaired/Different from baseline Area of Impairment: Memory, Following commands, Safety/judgement, Awareness, Problem solving Current Attention Level: Selective Memory: Decreased recall of precautions Following Commands: Follows one step commands consistently Safety/Judgement: Decreased awareness of safety, Decreased awareness of deficits Awareness: Emergent Problem Solving: Slow processing, Requires verbal cues, Difficulty sequencing General Comments: decreased self awareness  Physical Exam: Blood pressure (!) 145/98, pulse 91, temperature 98.5 F (36.9 C), temperature source Oral, resp. rate 18, height 6\' 3"  (1.905 m), weight (!) 147.4 kg, SpO2 98 %. Physical Exam Vitals reviewed.  Constitutional:      General: He is not in acute distress.    Appearance: He is well-developed. He is obese.  HENT:     Head: Normocephalic.     Comments: Craniotomy site clean and dry Left external ear normal Right external ear normal Eyes:     Extraocular Movements: Extraocular movements intact.     Comments: No discharge from eyes  Pulmonary:     Effort: Pulmonary effort is normal. No tachypnea.     Breath sounds: No stridor.  Abdominal:     Palpations: Abdomen is soft.     Comments: Nondistended  Musculoskeletal:     Cervical back: Normal range of motion and neck supple.     Comments: No edema or tenderness in extremities  Skin:    General: Skin is warm and dry.     Comments: Craney site C/D/I  Neurological:     Mental Status: He is alert.     Comments: Lethargic Makes good eye contact with examiner.   Oriented x3 and follows commands. Motor: RUE/LE: 4+-5/5 proximal distal RUE/RLE: 4+/5 proximal to distal  Psychiatric:        Mood and Affect: Affect is blunt and flat.        Speech: Speech is delayed.     Results for orders placed or performed during the  hospital encounter of 11/16/19 (from the past 48 hour(s))  Basic metabolic panel     Status: Abnormal   Collection Time: 11/21/19  5:39 AM  Result Value Ref Range   Sodium 139 135 - 145 mmol/L   Potassium 3.7 3.5 - 5.1 mmol/L   Chloride 103 98 - 111 mmol/L   CO2 26 22 - 32 mmol/L   Glucose, Bld 97 70 - 99 mg/dL    Comment: Glucose reference range applies only to samples taken after fasting for at least 8 hours.   BUN 10 6 - 20 mg/dL   Creatinine, Ser 1.610.95 0.61 - 1.24 mg/dL   Calcium 8.7 (L) 8.9 - 10.3 mg/dL   GFR calc non Af Amer >60 >60 mL/min   GFR calc Af Amer >60 >60 mL/min   Anion gap 10 5 - 15    Comment: Performed at [facility], [address] [place], [place] [address]  Protime-INR     Status: None   Collection Time: 11/21/19  5:39 AM  Result Value Ref Range   Prothrombin Time 13.7 11.4 - 15.2 seconds   INR 1.1 0.8 - 1.2    Comment: (NOTE) INR goal varies based on device and disease states. Performed at [facility], [address] [place], [place] [address]    ECHO TEE  Result Date: 11/21/2019    TRANSESOPHOGEAL ECHO REPORT   Patient Name:   Manuela SchwartzMICHAEL J Cressman Date of Exam: 11/21/2019  Medical Rec #:  [id number]      Height:       75.0 in Accession #:    [id number]     Weight:       325.0 lb Date of Birth:  06-Jan-1971      BSA:          2.697 m Patient Age:    48 years       BP:           157/106 mmHg Patient Gender: M              HR:           73 bpm. Exam Location:  Inpatient Procedure: Transesophageal Echo, Cardiac Doppler and Color Doppler Indications:     Stroke  History:         Patient has prior history of Echocardiogram examinations, most                  recent 11/17/2019.  Sonographer:     Margreta Journey Referring Phys:  [id number] LAURA R INGOLD Diagnosing Phys: [name] MD PROCEDURE: After discussion of the risks and benefits of a TEE, an informed consent was obtained from the patient. The transesophogeal probe was passed without difficulty through the  esophogus of the patient. Local oropharyngeal anesthetic was provided with Cetacaine. Sedation performed by different physician. Image quality was good. The patient developed no complications during the procedure. IMPRESSIONS  1. Left ventricular ejection fraction, by estimation, is 60 to 65%. The left ventricle has normal function. The left ventricle has no regional wall motion abnormalities.  2. Right ventricular systolic function is normal. The right ventricular size is normal.  3. No left atrial/left atrial appendage thrombus was detected.  4. The mitral valve is normal in structure. Trivial mitral valve regurgitation. No evidence of mitral stenosis.  5. The aortic valve is tricuspid. Aortic valve regurgitation is trivial. No aortic stenosis is present.  6. There is mild (Grade II) plaque involving the descending aorta.  7. No shunt seen by color flow doppler. Agitated saline contrast bubble study was positive with shunting observed within 3-6 cardiac cycles suggestive of interatrial shunt. Conclusion(s)/Recommendation(s): Study positive for intra-atrial flow communication (positive bubble study). Highly mobile intra-atrial septum, but no clear PFO or ASD color flow seen despite multiple angles/windows. Recommend consideration of additional  outpatient imaging (cardiac MRI vs. Cardiac CT) to further evaluation anatomy prior to consideration of transcatheter closure. FINDINGS  Left Ventricle: Left ventricular ejection fraction, by estimation, is 60 to 65%. The left ventricle has normal function. The left ventricle has no regional wall motion abnormalities. The left ventricular internal cavity size was normal in size. There is  no left ventricular hypertrophy. Right Ventricle: The right ventricular size is normal. No increase in right ventricular wall thickness. Right ventricular systolic function is normal. Left Atrium: Left atrial size was not well visualized. No left atrial/left atrial appendage thrombus was  detected. Right Atrium: Right atrial size was not well visualized. Pericardium: There is no evidence of pericardial effusion. Mitral Valve: The mitral valve is normal in structure. Trivial mitral valve regurgitation. No evidence of mitral valve stenosis. There is no evidence of mitral valve vegetation. Tricuspid Valve: The tricuspid valve is normal in structure. Tricuspid valve regurgitation is trivial. No evidence of tricuspid stenosis. There is no evidence of tricuspid valve vegetation. Aortic Valve: Trivial regurgitation seen at Aortic aspect of RCC-NCC commissure. The aortic valve is tricuspid. Aortic valve regurgitation is trivial. No aortic  stenosis is present. There is no evidence of aortic valve vegetation. Pulmonic Valve: The pulmonic valve was normal in structure. Pulmonic valve regurgitation is trivial. There is no evidence of pulmonic valve vegetation. Aorta: The aortic root is normal in size and structure. There is mild (Grade II) plaque involving the descending aorta. IAS/Shunts: The interatrial septum is aneurysmal. No shunt seen by color flow doppler. Agitated saline contrast was given intravenously to evaluate for intracardiac shunting. Agitated saline contrast bubble study was positive with shunting observed within 3-6 cardiac cycles suggestive of interatrial shunt. [name] MD Electronically signed by [name] MD Signature Date/Time: 11/21/2019/4:36:07 PM    Final        Medical Problem List and Plan: 1.  Decreased functional mobility with right side weakness secondary to left PICA territory infarction status post craniotomy 11/17/2019 for brainstem compression and obstructive hydrocephalus  -patient maynot shower  -ELOS/Goals: 12-15 days/supervision  Admit to CIR 2.  Antithrombotics: -DVT/anticoagulation: Lovenox  -antiplatelet therapy: Aspirin 81 mg daily and Plavix 75 mg daily x3 weeks then aspirin alone 3. Pain Management: Oxycodone as needed, tramadol as  needed, Fioricet as needed  Monitor headaches with increased exertion 4. Mood: Lexapro 10 mg daily Klonopin 0.5 mg as needed sleep  -antipsychotic agents: N/A 5. Neuropsych: This patient is?  Fully capable of making decisions on his own behalf. 6. Skin/Wound Care: Routine skin checks 7. Fluids/Electrolytes/Nutrition: Routine in and outs.  CMP ordered 8.  PFO.  Follow-up outpatient Dr. Excell Seltzer for consideration of closure 9.  Hypertension.  Norvasc 5 mg daily, Cozaar 50 mg daily.    Monitor increase mobility 10.  Hyperlipidemia.  Lipitor 11.  Persistent headaches-likely vascular.  Continue Depakote  See #3 12.  Sleep apnea.  Continue CPAP  Mcarthur Rossetti Angiulli, [name] 11/22/2019  I have personally performed a face to face diagnostic evaluation, including, but not limited to relevant history and physical exam findings, of this patient and developed relevant assessment and plan.  Additionally, I have reviewed and concur with the physician assistant's documentation above.  Maryla Morrow, MD, ABPMR  The patient's status has not changed. The original post admission physician evaluation remains appropriate, and any changes from the pre-admission screening or documentation from the acute chart are noted above.   Maryla Morrow, MD, ABPMR

## 2019-11-22 NOTE — Progress Notes (Signed)
Inpatient Rehabilitation Medication Review by a Pharmacist  A complete drug regimen review was completed for this patient to identify any potential clinically significant medication issues.  Clinically significant medication issues were identified:  no  Pharmacist comments: no medication issues found - stop date for DAPT appropriate and in place - new amlodipine order continued - new atorvastatin dose continued.  Time spent performing this drug regimen review (minutes):  10  Harlow Mares, PharmD Clinical Pharmacist  11/22/2019   9:13 PM

## 2019-11-22 NOTE — Progress Notes (Signed)
PMR Admission Coordinator Pre-Admission Assessment   Patient: [name] is an 49 y.o., male MRN: [id number] DOB: 19-Aug-1970 Height: _0  (190.5 cm) Weight: (!) 147.4 kg   Insurance Information HMO:     PPO: yes     PCP:      IPA:      80/20:     OTHER:  PRIMARY: [place]      Policy#: [id number]      Subscriber: Patient  CM Name: Gildardo Cranker      Phone#:      Fax#: [phone] Pre-Cert#: [id number] [place] for CIR provided by Gildardo Cranker at [address] [facility] with updates due to Lourena Simmonds (ph [phone] ext [phone]) at fax listed above on 7/13.      Employer:  Benefits:  Phone #: [phone]     Name:  Eff. Date: 05/19/19     Deduct: $0      Out of Pocket Max: $500 ($205 met)      Life Max: n/a CIR: 80%      SNF: 80% Outpatient:      Co-Pay: $17/visit Home Health: 100%      Co-Pay:  DME: 100%     Co-Pay:  Providers:  SECONDARY:       Policy#:      Phone#:    The "Data Collection Information Summary" for patients in Inpatient Rehabilitation Facilities with attached "Privacy Act [place] Records" was provided and verbally reviewed with: N/A   Emergency Contact Information                Contact Information     Name Relation Home Work Mobile     Dietzman,[name] Spouse     [phone]          Current Medical History  Patient Admitting Diagnosis: CVA History of Present Illness: [name] is a 49 y.o. right-handed male with history of hypertension, sleep apnea with intermittent CPAP, hyperlipidemia, depression/anxiety, obesity with BMI 39.68.  Presented 11/16/2019 with dizziness and gait abnormality.  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  An additional punctate acute infarct question within the left occipital lobe.  CT angiogram of head and neck with no large vessel occlusion or stenosis.  Echocardiogram with ejection fraction of 65% no wall motion abnormalities.   Admission chemistries with potassium 3.3, glucose  189, troponin negative, urinalysis negative nitrite, alcohol negative, lactic acid 5.2, hemoglobin A1c 5.7.  Follow-up CT and imaging due to increasing headaches progressive somnolence showed brainstem compression and obstructive hydrocephalus with patient undergoing suboccipital craniectomy 11/17/2019 per Dr. Venetia Constable.  Neurology follow-up currently maintained on aspirin for CVA prophylaxis.  Subcutaneous Lovenox for DVT prophylaxis.  Patient has been on Cleviprex for blood pressure control.  Tolerating a regular diet.  Therapy evaluations completed with recommendations of physical medicine rehab consult.   Complete NIHSS TOTAL: (P) 0   Patient's medical record from [facility] has been reviewed by the rehabilitation admission coordinator and physician.   Past Medical History         Past Medical History:  Diagnosis Date  . Arthritis      neck  . Carpal tunnel syndrome on both sides 08/2014  . DDD (degenerative disc disease), lumbar    . History of kidney stones    . Hypertension      under control with med., has been on med. x 1 yr.  . Sleep apnea      CPAP  but does not use      Family History   family history is not on file.   Prior Rehab/Hospitalizations Has the patient had prior rehab or hospitalizations prior to admission? No   Has the patient had major surgery during 100 days prior to admission? Yes              Current Medications   Current Facility-Administered Medications:  .   stroke: mapping our early stages of recovery book, , Does not apply, Once, Buford Dresser, MD .  0.9 %  sodium chloride infusion, , Intravenous, Continuous, Cecilie Kicks R, NP .  0.9 % NaCl with KCl 20 mEq/ L  infusion, , Intravenous, Continuous, Buford Dresser, MD, Last Rate: 75 mL/hr at 11/22/19 0800, Rate Verify at 11/22/19 0800 .  acetaminophen (TYLENOL) tablet 650 mg, 650 mg, Oral, Q6H PRN, 650 mg at 11/21/19 1303 **OR** acetaminophen (TYLENOL) suppository 650 mg, 650  mg, Rectal, Q6H PRN, Buford Dresser, MD .  amLODipine (NORVASC) tablet 5 mg, 5 mg, Oral, Daily, Biby, Sharon L, NP, 5 mg at 11/22/19 0927 .  aspirin EC tablet 81 mg, 81 mg, Oral, Daily, Buford Dresser, MD, 81 mg at 11/22/19 0927 .  atorvastatin (LIPITOR) tablet 80 mg, 80 mg, Oral, Daily, Buford Dresser, MD, 80 mg at 11/22/19 0927 .  bisacodyl (DULCOLAX) suppository 10 mg, 10 mg, Rectal, Daily PRN, Buford Dresser, MD .  butalbital-acetaminophen-caffeine (FIORICET) 50-325-40 MG per tablet 2 tablet, 2 tablet, Oral, Q6H PRN, Buford Dresser, MD, 2 tablet at 11/22/19 0410 .  Chlorhexidine Gluconate Cloth 2 % PADS 6 each, 6 each, Topical, Daily, Garvin Fila, MD, 6 each at 11/22/19 1107 .  clonazePAM (KLONOPIN) tablet 0.5 mg, 0.5 mg, Oral, PRN, Buford Dresser, MD, 0.5 mg at 11/21/19 2247 .  [COMPLETED] valproate (DEPACON) 500 mg in dextrose 5 % 50 mL IVPB, 500 mg, Intravenous, Once, Last Rate: 55 mL/hr at 11/21/19 1100, Rate Verify at 11/21/19 1100 **FOLLOWED BY** divalproex (DEPAKOTE ER) 24 hr tablet 500 mg, 500 mg, Oral, Daily, Buford Dresser, MD, 500 mg at 11/22/19 0927 .  docusate sodium (COLACE) capsule 100 mg, 100 mg, Oral, BID, Buford Dresser, MD, 100 mg at 11/21/19 1303 .  enoxaparin (LOVENOX) injection 40 mg, 40 mg, Subcutaneous, Daily, Buford Dresser, MD, 40 mg at 11/22/19 0929 .  escitalopram (LEXAPRO) tablet 10 mg, 10 mg, Oral, Daily, Buford Dresser, MD, 10 mg at 11/21/19 1302 .  labetalol (NORMODYNE) injection 10 mg, 10 mg, Intravenous, Q4H PRN, Buford Dresser, MD, 10 mg at 11/20/19 1842 .  losartan (COZAAR) tablet 50 mg, 50 mg, Oral, Daily, Buford Dresser, MD, 50 mg at 11/22/19 0927 .  meclizine (ANTIVERT) tablet 25 mg, 25 mg, Oral, TID PRN, Buford Dresser, MD, 25 mg at 11/19/19 1658 .  MEDLINE mouth rinse, 15 mL, Mouth Rinse, BID, Buford Dresser, MD, 15 mL at 11/22/19 1109 .   ondansetron (ZOFRAN) tablet 4 mg, 4 mg, Oral, Q6H PRN **OR** ondansetron (ZOFRAN) injection 4 mg, 4 mg, Intravenous, Q6H PRN, Buford Dresser, MD, 4 mg at 11/18/19 2220 .  oxyCODONE (Oxy IR/ROXICODONE) immediate release tablet 5 mg, 5 mg, Oral, Q4H PRN, Buford Dresser, MD, 5 mg at 11/22/19 0410 .  promethazine (PHENERGAN) injection 25 mg, 25 mg, Intravenous, Q6H PRN, Buford Dresser, MD .  traMADol (ULTRAM) tablet 50 mg, 50 mg, Oral, Q6H PRN, Buford Dresser, MD, 50 mg at 11/22/19 1401   Patients Current Diet:       Diet Order  Diet Heart Room service appropriate? Yes with Assist; Fluid consistency: Thin  Diet effective now                          Precautions / Restrictions Precautions Precautions: Fall Precaution Comments: sensitive to light Restrictions Weight Bearing Restrictions: No    Has the patient had 2 or more falls or a fall with injury in the past year? No   Prior Activity Level  Pt. Was working and driving and independent with all ADLs prior to admission.    Prior Functional Level Self Care: Did the patient need help bathing, dressing, using the toilet or eating? Independent   Indoor Mobility: Did the patient need assistance with walking from room to room (with or without device)? Independent   Stairs: Did the patient need assistance with internal or external stairs (with or without device)? Independent   Functional Cognition: Did the patient need help planning regular tasks such as shopping or remembering to take medications? Independent   Home Assistive Devices / Equipment Home Assistive Devices/Equipment: None Home Equipment: Walker - 4 wheels   Prior Device Use: Indicate devices/aids used by the patient prior to current illness, exacerbation or injury? None of the above   Current Functional Level Cognition   Overall Cognitive Status: Impaired/Different from baseline Current Attention Level:  Selective Orientation Level: (P) Oriented X4 Following Commands: Follows one step commands consistently Safety/Judgement: Decreased awareness of safety, Decreased awareness of deficits General Comments: decreased self awareness    Extremity Assessment (includes Sensation/Coordination)   Upper Extremity Assessment: Overall WFL for tasks assessed  Lower Extremity Assessment: Defer to PT evaluation     ADLs   Overall ADL's : Needs assistance/impaired Eating/Feeding: Set up, Sitting Grooming: Set up, Sitting Upper Body Bathing: Set up, Supervision/ safety, Sitting Lower Body Bathing: Moderate assistance, Cueing for safety, Sit to/from stand Upper Body Dressing : Minimal assistance, Sitting Upper Body Dressing Details (indicate cue type and reason): limited by neck pain Lower Body Dressing: Moderate assistance, Cueing for safety, Sit to/from stand Lower Body Dressing Details (indicate cue type and reason): able to complete figure four position to donn socks/underwear Toilet Transfer: Cueing for safety, Minimal assistance Toileting- Clothing Manipulation and Hygiene: Minimal assistance, Cueing for safety, Sit to/from stand Functional mobility during ADLs: Minimal assistance, Cueing for safety, Rolling walker, Cueing for sequencing General ADL Comments: Cues to open eyes for ADL     Mobility   Overal bed mobility: Needs Assistance Bed Mobility: Supine to Sit Supine to sit: Supervision Sit to supine: Supervision General bed mobility comments: Pt received in recliner and returned to recliner.     Transfers   Overall transfer level: Needs assistance Equipment used: Rolling walker (2 wheeled) Transfers: Sit to/from Stand Sit to Stand: Min assist Stand pivot transfers: Min assist General transfer comment: pt utilizing railing in bathroom to pull up into standing, instability noted both anterior-posterior and laterally     Ambulation / Gait / Stairs / Wheelchair Mobility    Ambulation/Gait Ambulation/Gait assistance: Mod assist Gait Distance (Feet): 150 Feet Assistive device: Rolling walker (2 wheeled) Gait Pattern/deviations: Step-to pattern General Gait Details: pt with short step to gait, instability noted in all directions, pt does demonstrate improved awareness of instability, stopping ambulation and dual-task activities multiple times to regain balance Gait velocity: reduced Gait velocity interpretation: <1.8 ft/sec, indicate of risk for recurrent falls     Posture / Balance Dynamic Sitting Balance Sitting balance - Comments: reliant on  UE support, requires supervision Balance Overall balance assessment: Needs assistance Sitting-balance support: Single extremity supported, Feet supported Sitting balance-Leahy Scale: Poor Sitting balance - Comments: reliant on UE support, requires supervision Standing balance support: Bilateral upper extremity supported Standing balance-Leahy Scale: Poor Standing balance comment: reliant on BUE support and minA due to increased sway     Special needs/care consideration Skin: Surgical incision to the scalp and Designated visitor [name] Deisher (spouse)    Previous Environmental health practitioner (from acute therapy documentation) Living Arrangements: Spouse/significant other Available Help at Discharge: Family, Available 24 hours/day Type of Home: House Home Layout: One level Home Access: Stairs to enter Entrance Stairs-Rails: Right Entrance Stairs-Number of Steps: 3 Bathroom Shower/Tub: Chiropodist: [place]: No   Discharge Living Setting Plans for Discharge Living Setting: Patient's home Type of Home at Discharge: House Discharge Home Layout: One level Discharge Home Access: Stairs to enter Entrance Stairs-Rails: Right Entrance Stairs-Number of Steps: 3 Discharge Bathroom Shower/Tub: Tub/shower unit Discharge Bathroom Toilet: Standard Discharge Bathroom Accessibility: Yes How  Accessible: Accessible via walker Does the patient have any problems obtaining your medications?: No   Social/Family/Support Systems Patient Roles: Spouse Contact Information: [phone] Anticipated Caregiver: Romney Compean Anticipated Caregiver's Contact Information: [phone] Ability/Limitations of Caregiver: None  Caregiver Availability: 24/7 Discharge Plan Discussed with Primary Caregiver: Yes Is Caregiver In Agreement with Plan?: Yes Does Caregiver/Family have Issues with Lodging/Transportation while Pt is in Rehab?: No   Goals Patient/Family Goal for Rehab: PT/OT/SLP Supervision  Expected length of stay:10-14 days   Pt/Family Agrees to Admission and willing to participate: Yes Program Orientation Provided & Reviewed with Pt/Caregiver Including Roles  & Responsibilities: Yes   Decrease burden of Care through IP rehab admission: Specialzed equipment needs, Decrease number of caregivers and Patient/family education   Possible need for SNF placement upon discharge: Not anticipated   Patient Condition: I have reviewed medical records from [facility], spoken with CM, and patient and spouse. I met with patient at the bedside for inpatient rehabilitation assessment.  Patient will benefit from ongoing PT, OT and SLP, can actively participate in 3 hours of therapy a day 5 days of the week, and can make measurable gains during the admission.  Patient will also benefit from the coordinated team approach during an Inpatient Acute Rehabilitation admission.  The patient will receive intensive therapy as well as Rehabilitation physician, nursing, social worker, and care management interventions.  Due to safety, skin/wound care, disease management, medication administration, pain management and patient education the patient requires 24 hour a day rehabilitation nursing.  The patient is currently Min assist to Mod assist** with mobility and basic ADLs.  Discharge setting and  therapy post discharge at home with home health is anticipated.  Patient has agreed to participate in the Acute Inpatient Rehabilitation Program and will admit today.   Preadmission Screen Completed By:  Michel Santee, 11/22/2019 2:41 PM ______________________________________________________________________   Discussed status with Dr. Posey Pronto on 11/22/19 at 2:51 PM  and received approval for admission today.   Admission Coordinator:  Michel Santee, PT, DPT time 2:51 PM Sudie Grumbling 11/22/19

## 2019-11-22 NOTE — Discharge Summary (Addendum)
Stroke Discharge Summary  Patient ID: [name]    l   MRN: [id number]      DOB: 10-Jan-1971  Date of Admission: 11/16/2019 Date of Discharge: 11/22/2019  Attending Physician:  Micki RileySethi, Marili Vader S, MD, Stroke MD Consultant(s):  Maurice Smallstergard, Clovis Puhomas A, MD (neurosurgery), Sula SodaKrutika Raulkar, MD (Physical Medicine & Rehabilitation)  Patient's PCP:  Sigmund HazelMiller, Lisa, MD  Discharge Diagnoses:  Principal Problem:   Ischemic stroke (HCC) - L PICA infarct embolic in setting of PFO s/p crani Active Problems:   DDD (degenerative disc disease), lumbar   Lumbar radiculopathy   Hypertension   Intractable nausea and vomiting   Hypokalemia   Depression with anxiety   PFO (patent foramen ovale)   Headache   Cerebral edema (HCC)   Chest pain   Hyperlipidemia LDL goal <70   OSA (obstructive sleep apnea)   Medications to be continued on Rehab Allergies as of 11/22/2019   No Known Allergies      Medication List     STOP taking these medications    HYDROcodone-acetaminophen 5-325 MG tablet Commonly known as: NORCO/VICODIN   ibuprofen 800 MG tablet Commonly known as: ADVIL   ondansetron 4 MG disintegrating tablet Commonly known as: Zofran ODT   oxyCODONE-acetaminophen 5-325 MG tablet Commonly known as: Percocet       TAKE these medications    amLODipine 5 MG tablet Commonly known as: NORVASC Take 1 tablet (5 mg total) by mouth daily. Start taking on: November 23, 2019   aspirin 81 MG EC tablet Take 1 tablet (81 mg total) by mouth daily. Swallow whole. Start taking on: November 23, 2019   atorvastatin 80 MG tablet Commonly known as: LIPITOR Take 1 tablet (80 mg total) by mouth daily. Start taking on: November 23, 2019 What changed:  medication strength how much to take   bisacodyl 10 MG suppository Commonly known as: DULCOLAX Place 1 suppository (10 mg total) rectally daily as needed for moderate constipation.   butalbital-acetaminophen-caffeine 50-325-40 MG tablet Commonly known as:  FIORICET Take 2 tablets by mouth every 6 (six) hours as needed for headache.   clonazePAM 0.5 MG tablet Commonly known as: KLONOPIN Take 0.5 mg by mouth as needed (sleep).   clopidogrel 75 MG tablet Commonly known as: PLAVIX Take 1 tablet (75 mg total) by mouth daily for 21 days.   divalproex 500 MG 24 hr tablet Commonly known as: DEPAKOTE ER Take 1 tablet (500 mg total) by mouth daily. Start taking on: November 23, 2019   docusate sodium 100 MG capsule Commonly known as: COLACE Take 1 capsule (100 mg total) by mouth 2 (two) times daily.   enoxaparin 40 MG/0.4ML injection Commonly known as: LOVENOX Inject 0.4 mLs (40 mg total) into the skin daily. Start taking on: November 23, 2019   escitalopram 10 MG tablet Commonly known as: LEXAPRO Take 10 mg by mouth daily.   losartan 50 MG tablet Commonly known as: COZAAR Take 50 mg by mouth daily.   mouth rinse Liqd solution 15 mLs by Mouth Rinse route 2 (two) times daily.   oxyCODONE 5 MG immediate release tablet Commonly known as: Oxy IR/ROXICODONE Take 1 tablet (5 mg total) by mouth every 4 (four) hours as needed for moderate pain.   sodium chloride 0.9 % infusion Inject 20 mLs into the vein continuous.   traMADol 50 MG tablet Commonly known as: ULTRAM Take 1 tablet (50 mg total) by mouth every 6 (six) hours as needed for moderate pain.  Discharge Care Instructions  (From admission, onward)           Start     Ordered   11/22/19 0000  Discharge wound care:       Comments: Continue inpatient wound care as per neurosurgery   11/22/19 1519            LABORATORY STUDIES CBC    Component Value Date/Time   WBC 9.4 11/20/2019 0436   RBC 4.63 11/20/2019 0436   HGB 14.8 11/20/2019 0436   HCT 42.2 11/20/2019 0436   PLT 194 11/20/2019 0436   MCV 91.1 11/20/2019 0436   MCH 32.0 11/20/2019 0436   MCHC 35.1 11/20/2019 0436   RDW 11.7 11/20/2019 0436   LYMPHSABS 1.5 11/16/2019 0834   MONOABS 0.5  11/16/2019 0834   EOSABS 0.1 11/16/2019 0834   BASOSABS 0.0 11/16/2019 0834   CMP    Component Value Date/Time   NA 139 11/21/2019 0539   K 3.7 11/21/2019 0539   CL 103 11/21/2019 0539   CO2 26 11/21/2019 0539   GLUCOSE 97 11/21/2019 0539   BUN 10 11/21/2019 0539   CREATININE 0.95 11/21/2019 0539   CALCIUM 8.7 (L) 11/21/2019 0539   PROT 7.2 11/17/2019 0305   ALBUMIN 4.1 11/17/2019 0305   AST 23 11/17/2019 0305   ALT 38 11/17/2019 0305   ALKPHOS 69 11/17/2019 0305   BILITOT 0.9 11/17/2019 0305   GFRNONAA >60 11/21/2019 0539   GFRAA >60 11/21/2019 0539   COAGS Lab Results  Component Value Date   INR 1.1 11/21/2019   INR 1.0 02/14/2007   Lipid Panel    Component Value Date/Time   CHOL 181 11/16/2019 1623   TRIG 122 11/20/2019 0436   HDL 40 (L) 11/16/2019 1623   CHOLHDL 4.5 11/16/2019 1623   VLDL 7 11/16/2019 1623   LDLCALC 134 (H) 11/16/2019 1623   HgbA1C  Lab Results  Component Value Date   HGBA1C 5.7 (H) 11/16/2019   Urinalysis    Component Value Date/Time   COLORURINE STRAW (A) 11/16/2019 1255   APPEARANCEUR CLEAR 11/16/2019 1255   LABSPEC 1.015 11/16/2019 1255   PHURINE 6.0 11/16/2019 1255   GLUCOSEU >=500 (A) 11/16/2019 1255   HGBUR NEGATIVE 11/16/2019 1255   BILIRUBINUR NEGATIVE 11/16/2019 1255   KETONESUR 5 (A) 11/16/2019 1255   PROTEINUR NEGATIVE 11/16/2019 1255   UROBILINOGEN 1.0 02/14/2007 1726   NITRITE NEGATIVE 11/16/2019 1255   LEUKOCYTESUR NEGATIVE 11/16/2019 1255   Urine Drug Screen     Component Value Date/Time   LABOPIA NONE DETECTED 11/16/2019 1759   COCAINSCRNUR NONE DETECTED 11/16/2019 1759   LABBENZ NONE DETECTED 11/16/2019 1759   AMPHETMU NONE DETECTED 11/16/2019 1759   THCU POSITIVE (A) 11/16/2019 1759   LABBARB NONE DETECTED 11/16/2019 1759    Alcohol Level    Component Value Date/Time   ETH <10 11/16/2019 1623    SIGNIFICANT DIAGNOSTIC STUDIES CT ANGIO HEAD W OR WO CONTRAST  Result Date: 11/16/2019 CLINICAL DATA:   Cerebellar stroke EXAM: CT ANGIOGRAPHY HEAD AND NECK TECHNIQUE: Multidetector CT imaging of the head and neck was performed using the standard protocol during bolus administration of intravenous contrast. Multiplanar CT image reconstructions and MIPs were obtained to evaluate the vascular anatomy. Carotid stenosis measurements (when applicable) are obtained utilizing NASCET criteria, using the distal internal carotid diameter as the denominator. CONTRAST:  OMNIPAQUE IOHEXOL 350 MG/ML SOLN COMPARISON:  Correlation made with MRI performed earlier same day FINDINGS: CT HEAD Brain: There is hypodensity in  the left cerebellum inferiorly reflecting infarction seen on prior MRI. There is no evidence of hemorrhagic transformation. No hydrocephalus. There is no extra-axial fluid collection. Ventricles and sulci are within normal limits in size and configuration. Vascular: No hyperdense vessel or unexpected calcification. Skull: Calvarium is unremarkable. Sinuses/Orbits: No acute finding. Other: None. Review of the MIP images confirms the above findings CTA NECK Aortic arch: Great vessel origins are patent. Right carotid system: Patent. No measurable stenosis at the ICA origin Left carotid system: Patent. Trace calcified plaque at the ICA origin without measurable stenosis. Vertebral arteries: Patent and codominant. No measurable stenosis or evidence of dissection. Skeleton: Degenerative changes of the cervical spine, greatest at C5-C6 to C7-T1. Other neck: No mass or adenopathy. Upper chest: No apical lung mass. Review of the MIP images confirms the above findings CTA HEAD Anterior circulation: Intracranial internal carotid arteries are patent. Anterior and middle cerebral arteries are patent. Posterior circulation: Intracranial vertebral arteries patent. Patent bilateral PICA origins. Basilar artery is patent. A small right AICA is noted. Superior cerebellar artery origins are patent. Patent posterior cerebral  arteries. Venous sinuses: Patent as allowed by contrast bolus timing. Review of the MIP images confirms the above findings IMPRESSION: Evolving acute left cerebellar PICA territory infarction. No hydrocephalus or hemorrhage. No large vessel occlusion or hemodynamically significant stenosis. Left PICA origin is patent Electronically Signed   By: Guadlupe Spanish M.D.   On: 11/16/2019 21:17   DG Chest 2 View  Result Date: 11/16/2019 CLINICAL DATA:  Dizziness and lethargy. EXAM: CHEST - 2 VIEW COMPARISON:  Earlier portable chest x-ray, same date. FINDINGS: Borderline cardiac enlargement with left ventricular configuration. Mild tortuosity of the thoracic aorta. The mediastinal and hilar contours are unremarkable. The lungs are clear. No pleural effusion. The bony thorax is intact. IMPRESSION: No acute cardiopulmonary findings. The slightly prominent mediastinal contour appear to be due to mild tortuosity of the thoracic aorta. Electronically Signed   By: Rudie Meyer M.D.   On: 11/16/2019 10:10   CT HEAD WO CONTRAST  Result Date: 11/17/2019 CLINICAL DATA:  Stroke, follow-up. Suboccipital craniectomy for decompression. EXAM: CT HEAD WITHOUT CONTRAST TECHNIQUE: Contiguous axial images were obtained from the base of the skull through the vertex without intravenous contrast. COMPARISON:  Head CT 11/17/2019. FINDINGS: Brain: Postoperative changes from interval suboccipital decompressive craniectomy. There is scattered small volume acute subarachnoid hemorrhage within the posterior fossa. Persistent abnormal hypodensity at site of a subacute left cerebellar PICA territory infarct. Persistent partial effacement of the fourth ventricle. However, previously demonstrated mild lateral and third ventriculomegaly appears resolved. Trace scattered postoperative pneumocephalus predominantly within the posterior fossa. No demarcated cortical infarct is identified. No supratentorial midline shift No evidence of intracranial mass.  No midline shift. Vascular: No hyperdense vessel. Skull: Suboccipital craniectomy. Sinuses/Orbits: Visualized orbits show no acute finding. Mild scattered paranasal sinus mucosal thickening. Small left maxillary sinus mucous retention cyst. No significant mastoid effusion. Other: Postsurgical changes to the suboccipital and upper neck soft tissues with subcutaneous gas. IMPRESSION: Postoperative changes from interval suboccipital decompressive craniectomy. There is scattered small volume postoperative subarachnoid hemorrhage within the posterior fossa. Redemonstrated parenchymal edema at site of a subacute left cerebellar PICA territory infarct. Persistent partial effacement of the fourth ventricle. However, the previously demonstrated subtle lateral and third ventriculomegaly appears resolved. Electronically Signed   By: Jackey Loge DO   On: 11/17/2019 21:37   CT HEAD WO CONTRAST  Result Date: 11/17/2019 CLINICAL DATA:  Follow-up stroke EXAM: CT HEAD WITHOUT CONTRAST TECHNIQUE:  Contiguous axial images were obtained from the base of the skull through the vertex without intravenous contrast. COMPARISON:  11/16/2019 FINDINGS: Brain: There again noted changes consistent with the known left cerebellar infarct. No findings to suggest acute hemorrhage, new infarct or space-occupying mass lesion are seen. Vascular: No hyperdense vessel or unexpected calcification. Skull: Normal. Negative for fracture or focal lesion. Sinuses/Orbits: No acute finding. Other: None. IMPRESSION: Left cerebellar infarct relatively stable from the prior exam. No new focal abnormality is seen. Electronically Signed   By: Alcide Clever M.D.   On: 11/17/2019 12:31   CT ANGIO NECK W OR WO CONTRAST  Result Date: 11/16/2019 CLINICAL DATA:  Cerebellar stroke EXAM: CT ANGIOGRAPHY HEAD AND NECK TECHNIQUE: Multidetector CT imaging of the head and neck was performed using the standard protocol during bolus administration of intravenous contrast.  Multiplanar CT image reconstructions and MIPs were obtained to evaluate the vascular anatomy. Carotid stenosis measurements (when applicable) are obtained utilizing NASCET criteria, using the distal internal carotid diameter as the denominator. CONTRAST:  OMNIPAQUE IOHEXOL 350 MG/ML SOLN COMPARISON:  Correlation made with MRI performed earlier same day FINDINGS: CT HEAD Brain: There is hypodensity in the left cerebellum inferiorly reflecting infarction seen on prior MRI. There is no evidence of hemorrhagic transformation. No hydrocephalus. There is no extra-axial fluid collection. Ventricles and sulci are within normal limits in size and configuration. Vascular: No hyperdense vessel or unexpected calcification. Skull: Calvarium is unremarkable. Sinuses/Orbits: No acute finding. Other: None. Review of the MIP images confirms the above findings CTA NECK Aortic arch: Great vessel origins are patent. Right carotid system: Patent. No measurable stenosis at the ICA origin Left carotid system: Patent. Trace calcified plaque at the ICA origin without measurable stenosis. Vertebral arteries: Patent and codominant. No measurable stenosis or evidence of dissection. Skeleton: Degenerative changes of the cervical spine, greatest at C5-C6 to C7-T1. Other neck: No mass or adenopathy. Upper chest: No apical lung mass. Review of the MIP images confirms the above findings CTA HEAD Anterior circulation: Intracranial internal carotid arteries are patent. Anterior and middle cerebral arteries are patent. Posterior circulation: Intracranial vertebral arteries patent. Patent bilateral PICA origins. Basilar artery is patent. A small right AICA is noted. Superior cerebellar artery origins are patent. Patent posterior cerebral arteries. Venous sinuses: Patent as allowed by contrast bolus timing. Review of the MIP images confirms the above findings IMPRESSION: Evolving acute left cerebellar PICA territory infarction. No hydrocephalus or  hemorrhage. No large vessel occlusion or hemodynamically significant stenosis. Left PICA origin is patent Electronically Signed   By: Guadlupe Spanish M.D.   On: 11/16/2019 21:17   MR BRAIN WO CONTRAST  Addendum Date: 11/16/2019   ADDENDUM REPORT: 11/16/2019 16:35 ADDENDUM: These results were called by telephone at the time of interpretation on 11/16/2019 at 4:34 pm to provider Dr. Jacqulyn Bath, who verbally acknowledged these results. Electronically Signed   By: Jackey Loge DO   On: 11/16/2019 16:35   Result Date: 11/16/2019 CLINICAL DATA:  Persistent vertigo; history of TIA, lupus; vertigo, central. EXAM: MRI HEAD WITHOUT CONTRAST TECHNIQUE: Multiplanar, multiecho pulse sequences of the brain and surrounding structures were obtained without intravenous contrast. COMPARISON:  No pertinent prior studies available for comparison. FINDINGS: Brain: There is a focus of restricted diffusion within the left cerebellar hemisphere measuring 4.1 x 3.3 cm in transaxial dimensions consistent with acute left PICA vascular territory infarct. Corresponding T2/FLAIR hyperintensity at this site. An additional punctate acute infarct versus artifact is questioned within the left occipital lobe (  series 5, image 75). There is no significant white matter disease. No evidence of intracranial mass. No chronic intracranial blood products. No extra-axial fluid collection. No midline shift. Vascular: Expected proximal arterial flow voids. However, the left PICA is poorly assessed due to small vessel size. Skull and upper cervical spine: No focal marrow lesion. Sinuses/Orbits: Visualized orbits show no acute finding. Mild ethmoid sinus mucosal thickening. Small bilateral maxillary sinus mucous retention cysts. No significant mastoid effusion IMPRESSION: 4.1 cm acute left PICA territory ischemic infarct within the left cerebellar hemisphere. There is no significant posterior fossa mass effect at this time. An additional punctate acute infarct is  questioned within the left occipital lobe. Otherwise unremarkable MRI appearance of the brain. Mild ethmoid sinus mucosal thickening. Small bilateral maxillary sinus mucous retention cysts. Electronically Signed: By: Jackey Loge DO On: 11/16/2019 16:15   DG Chest Portable 1 View  Result Date: 11/16/2019 CLINICAL DATA:  Chest pain. EXAM: PORTABLE CHEST 1 VIEW COMPARISON:  02/14/2007. FINDINGS: Mild prominence of the upper mediastinum noted. This may be secondary to AP apical lordotic portable technique. Upright PA and lateral chest x-ray suggested to exclude mediastinal widening. Borderline cardiomegaly also possibly related to AP technique. Low lung volumes with basilar atelectasis. No pleural effusion or pneumothorax. No displaced rib fracture identified. IMPRESSION: 1. Mild prominence of the upper mediastinum noted. This may be secondary to AP apical lordotic portable technique. Upright PA and lateral chest x-ray suggested to exclude mediastinal widening. Borderline cardiomegaly could also possibly related to AP technique. 2.  Low lung volumes with bibasilar atelectasis. This report was phoned to [facility] PA in the emergency room at 8:54 a.m. on 11/16/2019. Electronically Signed   By: Maisie Fus  Register   On: 11/16/2019 08:55   VAS [place] TRANSCRANIAL DOPPLER W BUBBLES  Result Date: 11/20/2019  Transcranial Doppler with Bubble Indications: Stroke. Performing Technologist: Jeb Levering RDMS, RVT  Examination Guidelines: A complete evaluation includes B-mode imaging, spectral Doppler, color Doppler, and power Doppler as needed of all accessible portions of each vessel. Bilateral testing is considered an integral part of a complete examination. Limited examinations for reoccurring indications may be performed as noted.  Summary:  A vascular evaluation was performed. The right middle cerebral artery was studied. An IV was inserted into the patient's left AC fossa. Verbal informed consent was obtained.  Mild to  moderate HITS heard at rest. Unable to perform Valsalva due to patient condition. PFO size: Spencer degree III Positibe TCD Bubble study indicative of small to medium size right to left shunt *See table(s) above for TCD measurements and observations.  Diagnosing physician: Delia Heady MD Electronically signed by Delia Heady MD on 11/20/2019 at 12:49:49 PM.    Final    ECHOCARDIOGRAM COMPLETE  Result Date: 11/17/2019    ECHOCARDIOGRAM REPORT   Patient Name:   [name] Date of Exam: 11/17/2019 Medical Rec #:  [id number]      Height:       75.0 in Accession #:    [id number]     Weight:       317.5 lb Date of Birth:  [birth year]      BSA:          2.671 m Patient Age:    48 years       BP:           156/89 mmHg Patient Gender: M              HR:  80 bpm. Exam Location:  Inpatient Procedure: 2D Echo, Color Doppler and Cardiac Doppler Indications:    Stroke i163.9  History:        Patient has no prior history of Echocardiogram examinations.                 Risk Factors:Hypertension and Sleep Apnea.  Sonographer:    Irving Burton Senior RDCS Referring Phys: [id number] Ollen Bowl  Sonographer Comments: Unable to turn due to extreme nausea. IMPRESSIONS  1. Left ventricular ejection fraction, by estimation, is 60 to 65%. The left ventricle has normal function. The left ventricle has no regional wall motion abnormalities. There is mild asymmetric left ventricular hypertrophy of the septal segment. Left ventricular diastolic parameters were normal.  2. Right ventricular systolic function is normal. The right ventricular size is normal. Tricuspid regurgitation signal is inadequate for assessing PA pressure.  3. The mitral valve is normal in structure. No evidence of mitral valve regurgitation. No evidence of mitral stenosis.  4. The aortic valve is tricuspid. Aortic valve regurgitation is not visualized. No aortic stenosis is present.  5. Aortic dilatation noted. 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 Sinus of valsalva measures 43 mm, aortic arch appears 40 mm.  6. The inferior vena cava is dilated in size with <50% respiratory variability, suggesting right atrial pressure of 15 mmHg. Conclusion(s)/Recommendation(s): No intracardiac source of embolism detected on this transthoracic study. A transesophageal echocardiogram is recommended to exclude cardiac source of embolism if clinically indicated. Consider CT angiography of the aorta to define aorta dimensions. FINDINGS  Left Ventricle: Left ventricular ejection fraction, by estimation, is 60 to 65%. The left ventricle has normal function. The left ventricle has no regional wall motion abnormalities. The left ventricular internal cavity size was normal in size. There is  mild asymmetric left ventricular hypertrophy of the septal segment. Left ventricular diastolic parameters were normal. Right Ventricle: The right ventricular size is normal. No increase in right ventricular wall thickness. Right ventricular systolic function is normal. Tricuspid regurgitation signal is inadequate for assessing PA pressure. Left Atrium: Left atrial size was normal in size. Right Atrium: Right atrial size was normal in size. Pericardium: There is no evidence of pericardial effusion. Mitral Valve: The mitral valve is normal in structure. Normal mobility of the mitral valve leaflets. Mild mitral annular calcification. No evidence of mitral valve regurgitation. No evidence of mitral valve stenosis. Tricuspid Valve: The tricuspid valve is normal in structure. Tricuspid valve regurgitation is trivial. No evidence of tricuspid stenosis. Aortic Valve: The aortic valve is tricuspid. Aortic valve regurgitation is not visualized. No aortic stenosis is present. Pulmonic Valve: The pulmonic valve was normal in structure. Pulmonic valve regurgitation is trivial. No evidence of pulmonic stenosis. Aorta: Aortic dilatation noted. 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 Venous: The inferior vena cava is dilated in size with less than 50% respiratory variability, suggesting right atrial pressure of 15 mmHg. IAS/Shunts: The interatrial septum was not well visualized.  LEFT VENTRICLE PLAX 2D LVIDd:         6.10 cm  Diastology LVIDs:         4.70 cm  LV e' lateral:   11.70 cm/s LV PW:         0.80 cm  LV E/e' lateral: 8.0 LV IVS:        1.20 cm  LV e' medial:    12.40 cm/s LVOT diam:     2.60  cm  LV E/e' medial:  7.6 LV SV:         114 LV SV Index:   43 LVOT Area:     5.31 cm  RIGHT VENTRICLE RV S prime:     19.60 cm/s TAPSE (M-mode): 3.0 cm LEFT ATRIUM             Index       RIGHT ATRIUM           Index LA diam:        3.90 cm 1.46 cm/m  RA Area:     21.50 cm LA Vol (A2C):   74.2 ml 27.78 ml/m RA Volume:   60.10 ml  22.51 ml/m LA Vol (A4C):   71.9 ml 26.92 ml/m LA Biplane Vol: 78.6 ml 29.43 ml/m  AORTIC VALVE LVOT Vmax:   89.40 cm/s LVOT Vmean:  68.900 cm/s LVOT VTI:    0.214 m  AORTA Ao Root diam: 4.30 cm Ao Asc diam:  4.50 cm Ao Arch diam: 4.0 cm MITRAL VALVE MV Area (PHT): 3.77 cm    SHUNTS MV Decel Time: 201 msec    Systemic VTI:  0.21 m MV E velocity: 93.80 cm/s  Systemic Diam: 2.60 cm MV A velocity: 51.40 cm/s MV E/A ratio:  1.82 Weston Brass MD Electronically signed by Weston Brass MD Signature Date/Time: 11/17/2019/10:46:56 AM    Final    ECHO TEE  Result Date: 11/21/2019    TRANSESOPHOGEAL ECHO REPORT   Patient Name:   [name] Date of Exam: 11/21/2019 Medical Rec #:  [id number]      Height:       75.0 in Accession #:    [id number]     Weight:       325.0 lb Date of Birth:  01-13-1971      BSA:          2.697 m Patient Age:    48 years       BP:           157/106 mmHg Patient Gender: M              HR:           73 bpm. Exam Location:  Inpatient Procedure: Transesophageal Echo, Cardiac Doppler and Color Doppler Indications:     Stroke  History:         Patient has prior history of Echocardiogram examinations, most                  recent  11/17/2019.  Sonographer:     Margreta Journey Referring Phys:  [id number] LAURA R INGOLD Diagnosing Phys: [name] MD PROCEDURE: After discussion of the risks and benefits of a TEE, an informed consent was obtained from the patient. The transesophogeal probe was passed without difficulty through the esophogus of the patient. Local oropharyngeal anesthetic was provided with Cetacaine. Sedation performed by different physician. Image quality was good. The patient developed no complications during the procedure. IMPRESSIONS  1. Left ventricular ejection fraction, by estimation, is 60 to 65%. The left ventricle has normal function. The left ventricle has no regional wall motion abnormalities.  2. Right ventricular systolic function is normal. The right ventricular size is normal.  3. No left atrial/left atrial appendage thrombus was detected.  4. The mitral valve is normal in structure. Trivial mitral valve regurgitation. No evidence of mitral stenosis.  5. The aortic valve is tricuspid. Aortic valve regurgitation is trivial. No aortic stenosis is present.  6. There is mild (Grade II) plaque involving the descending aorta.  7. No shunt seen by color flow doppler. Agitated saline contrast bubble study was positive with shunting observed within 3-6 cardiac cycles suggestive of interatrial shunt. Conclusion(s)/Recommendation(s): Study positive for intra-atrial flow communication (positive bubble study). Highly mobile intra-atrial septum, but no clear PFO or ASD color flow seen despite multiple angles/windows. Recommend consideration of additional  outpatient imaging (cardiac MRI vs. Cardiac CT) to further evaluation anatomy prior to consideration of transcatheter closure. FINDINGS  Left Ventricle: Left ventricular ejection fraction, by estimation, is 60 to 65%. The left ventricle has normal function. The left ventricle has no regional wall motion abnormalities. The left ventricular internal cavity size was normal in  size. There is  no left ventricular hypertrophy. Right Ventricle: The right ventricular size is normal. No increase in right ventricular wall thickness. Right ventricular systolic function is normal. Left Atrium: Left atrial size was not well visualized. No left atrial/left atrial appendage thrombus was detected. Right Atrium: Right atrial size was not well visualized. Pericardium: There is no evidence of pericardial effusion. Mitral Valve: The mitral valve is normal in structure. Trivial mitral valve regurgitation. No evidence of mitral valve stenosis. There is no evidence of mitral valve vegetation. Tricuspid Valve: The tricuspid valve is normal in structure. Tricuspid valve regurgitation is trivial. No evidence of tricuspid stenosis. There is no evidence of tricuspid valve vegetation. Aortic Valve: Trivial regurgitation seen at Aortic aspect of RCC-NCC commissure. The aortic valve is tricuspid. Aortic valve regurgitation is trivial. No aortic stenosis is present. There is no evidence of aortic valve vegetation. Pulmonic Valve: The pulmonic valve was normal in structure. Pulmonic valve regurgitation is trivial. There is no evidence of pulmonic valve vegetation. Aorta: The aortic root is normal in size and structure. There is mild (Grade II) plaque involving the descending aorta. IAS/Shunts: The interatrial septum is aneurysmal. No shunt seen by color flow doppler. Agitated saline contrast was given intravenously to evaluate for intracardiac shunting. Agitated saline contrast bubble study was positive with shunting observed within 3-6 cardiac cycles suggestive of interatrial shunt. [name] MD Electronically signed by [name] MD Signature Date/Time: 11/21/2019/4:36:07 PM    Final    VAS [place] LOWER EXTREMITY VENOUS (DVT)  Result Date: 11/17/2019  Lower Venous DVTStudy Indications: Stroke.  Performing Technologist: Jeb Levering RDMS, RVT  Examination Guidelines: A complete evaluation includes  B-mode imaging, spectral Doppler, color Doppler, and power Doppler as needed of all accessible portions of each vessel. Bilateral testing is considered an integral part of a complete examination. Limited examinations for reoccurring indications may be performed as noted. The reflux portion of the exam is performed with the patient in reverse Trendelenburg.  +---------+---------------+---------+-----------+----------+--------------+ RIGHT    CompressibilityPhasicitySpontaneityPropertiesThrombus Aging +---------+---------------+---------+-----------+----------+--------------+ CFV      Full           Yes      Yes                                 +---------+---------------+---------+-----------+----------+--------------+ SFJ      Full                                                        +---------+---------------+---------+-----------+----------+--------------+ FV Prox  Full                                                        +---------+---------------+---------+-----------+----------+--------------+  FV Mid   Full                                                        +---------+---------------+---------+-----------+----------+--------------+ FV DistalFull                                                        +---------+---------------+---------+-----------+----------+--------------+ PFV      Full                                                        +---------+---------------+---------+-----------+----------+--------------+ POP      Full           Yes      Yes                                 +---------+---------------+---------+-----------+----------+--------------+ PTV      Full                                                        +---------+---------------+---------+-----------+----------+--------------+ PERO     Full                                                         +---------+---------------+---------+-----------+----------+--------------+   +---------+---------------+---------+-----------+----------+--------------+ LEFT     CompressibilityPhasicitySpontaneityPropertiesThrombus Aging +---------+---------------+---------+-----------+----------+--------------+ CFV      Full           Yes      Yes                                 +---------+---------------+---------+-----------+----------+--------------+ SFJ      Full                                                        +---------+---------------+---------+-----------+----------+--------------+ FV Prox  Full                                                        +---------+---------------+---------+-----------+----------+--------------+ FV Mid   Full                                                        +---------+---------------+---------+-----------+----------+--------------+  FV DistalFull                                                        +---------+---------------+---------+-----------+----------+--------------+ PFV      Full                                                        +---------+---------------+---------+-----------+----------+--------------+ POP      Full           Yes      Yes                                 +---------+---------------+---------+-----------+----------+--------------+ PTV      Full                                                        +---------+---------------+---------+-----------+----------+--------------+ PERO     Full                                                        +---------+---------------+---------+-----------+----------+--------------+     Summary: BILATERAL: - No evidence of deep vein thrombosis seen in the lower extremities, bilaterally. -No evidence of popliteal cyst, bilaterally.   *See table(s) above for measurements and observations. Electronically signed by Sherald Hess MD on 11/17/2019 at  2:59:28 PM.    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 He reported that he went to bed last night normal, woke up this morning at 530, left the house at 7 AM to take his father to a doctor's appointment when he had a sudden onset of dizziness while he was driving.  He describes as a sudden onset of the whole world around him spinning making it difficult to concentrate on the road and drive.  He immediately pulled over, put his car in park, and his cousin's house was right around the corner, who called an ambulance for him. He was very ataxic while walking.  He also was complaining of chest pain going on for 2 or 3 days.  He was also very ataxic sitting up in bed. He was brought into the emergency room and evaluated for his chest pain and also an MRI was obtained for his complaints of dizziness for the concern of a posterior circulation stroke. 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  He was admitted to the hospital service and a neurological consultation was obtained. He was LKW: 7 AM  on 11/16/2019. tPA not administered as stroke not recognized initially in the ED-examination was normal other than subjective dizziness symptoms as documented in the ED provider note.  NIH stroke scale on arrival 0 per chart review. Premorbid modified Rankin scale (mRS): 0   HOSPITAL COURSE [name] is a 49 y.o. male with history of B carpal tunnel syndrome, hypertension, sleep apnea with intermittent CPAP usage, degenerative disc disease of the lumbar spine presenting with dizziness, ataxic gait along with CP.    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 Punctate L occipital infarct. Sinus dz.  CTA head & neck evolving L PICA infarct. No LVO CT Head - 11/17/19 1231 - stable Left cerebellar infarct  CT Head - 11/17/19 2137 -  interval suboccipital decompressive craniectomy. scattered small SAH w/in posterior fossa. subacute L cerebellar PICA territory infarct. Persistent partial effacement of the 4th ventricle. Previous lateral and third ventriculomegaly resolved.  2D Echo EF 60 to 65% TCD Doppler w/ bubble positive for PFO at rest, not able to perform valsalva this time LE Doppler no DVT TEE Highly mobile intra-atrial septum, but no clear PFO or ASD flow seen despite multiple angles/windows. consider OP imaging (cardiac MRI vs. Cardiac CT) to further evaluation anatomy prior to consideration of transcatheter closure.  UDS - THC LDL 134 HgbA1c 5.7 Hypercoagulable and autoimmune labs  ANA positive - needs further evaluation.  VTE prophylaxis - Lovenox 40 mg sq daily  No antithrombotic prior to admission, now on aspirin 81 mg daily. Add plavix 75 at d/c. Plan DAPT for 3 weeks, then ASA alone. 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  Suboccipital Craniectomy - [name], MD - 11/17/19 - 7:34 pm CT Head - 11/17/19 2137 -  interval suboccipital decompressive craniectomy. scattered small SAH w/in posterior fossa. subacute L cerebellar PICA territory infarct. Persistent partial effacement of the 4th ventricle. Previous lateral and third ventriculomegaly resolved.  Add depacon IV 1 gm followed by 500 ER daily for HA management Prn tylenol,  ultram, oxy   PFO Positive TCD bubble study at rest - spencer degree III Not able to do valsalva due to elevated ICP and not cooperative due to sleepiness Could be the cause of current stroke Will need outpt follow up with Dr. Excell Seltzer to consider PFO closure as outpt. Epic message sent   Hypertension Treated w/ cleviprex in ICU, now off Resumed home cozaar SBP goal < 180  slightly elevated Added norvasc 5 Long-term BP goal normotensive   Hyperlipidemia Home meds:  lipitor 10 Now on lipitor 80  LDL 134, goal < 70 Continue statin at discharge   Other Stroke Risk Factors ETOH use, alcohol level <10, advised to drink no more than 2 drink(s) a day2 Substance abuse - UDS:  THC POSITIVE. Patient advised to stop using due to stroke risk. Morbid Obesity, Body mass index is 40.62 kg/m., recommend weight loss, diet and exercise as appropriate  Family hx stroke (father). Mother died of MI in her early 109s.  Obstructive sleep apnea, does not use CPAP at home.  Refuses in hospital. Has on O2   Other Active Problems CP, resolved - troponin < 2 -> 5 Hypokalemia 3.4->3.1-> supplement -> 3.7 (also receiving NS IV with 20 meq KCL at 75 cc /hr)   Depression/anxiety on lexapro and klonopin Lumbar degenerative disc disease, stable Leukocytosis 9.4 - resolved Lactic acidosis 5.2->2.0 Lethargic secondary to opioid pain killers mostly.  lowered Oxycodone and stopped the Dilaudid.  avoid NSAIDs due to petechial hemorrhage.  Constipation added colace 100 bid + dulcolax supp prn  DISCHARGE EXAM Blood pressure (!) 145/98, pulse 91, temperature 98.5 F (36.9 C), temperature source Oral, resp. rate 18, height 6\' 3"  (1.905 m), weight (!) 147.4 kg, SpO2 98 %. Obese middle-aged Caucasian male not in distress. . Afebrile. Head is nontraumatic. Neck is supple without bruit.    Cardiac exam no murmur or gallop. Lungs are clear to auscultation. Distal pulses are well felt. Neurological Exam : Drowsy but can be  easily aroused.  Oriented to time place and person.  Speech is clear without dysarthria or apraxia.  Extraocular movements are full range with few beats of end gaze nystagmus on lateral gaze bilaterally.  Mild saccadic dysmetria.  Visual fields are full.  Fundi not visualized.  Motor system exam shows symmetric upper and lower extremity strength without focal weakness.  Mild left finger-to-nose dysmetria.  No need to heel ataxia.  Gait slightly broad-based and ataxic.  Discharge Diet  Heart healthy thin liquids  DISCHARGE PLAN Disposition:  Transfer to [facility] for ongoing PT, OT and ST aspirin 81 mg daily and clopidogrel 75 mg daily for secondary stroke prevention for 3 weeks then aspirin alone. Recommend ongoing stroke risk factor control by Primary Care Physician at time of discharge from inpatient rehabilitation. Follow-up PCP [facility], MD in 2 weeks following discharge from rehab. Follow-up in [facility] in 4 weeks following discharge from rehab, office to schedule an appointment.  Follow-up Dr. Sigmund Hazel to discuss PFO closure scheduled for 8/4 at 0800 Further evaluation of ANA positivity at follow up  35 minutes were spent preparing discharge.  Excell Seltzer, MSN, APRN, ANVP-BC, AGPCNP-BC Advanced Practice Stroke Nurse [facility] See Amion for Schedule & Pager information 11/22/2019 3:02 PM  I have personally obtained history,examined this patient, reviewed notes, independently viewed imaging studies, participated in medical decision making and plan of care.ROS completed by me personally and pertinent positives fully documented  I have made any additions or clarifications directly to the above note. Agree with note above.   01/23/2020, MD Medical Director [facility] Pager: [phone] 11/22/2019 4:33 PM

## 2019-11-22 NOTE — TOC CAGE-AID Note (Signed)
Transition of Care [facility]) - CAGE-AID Screening   Patient Details  Name: [name] MRN: [id number] Date of Birth: [birth year]  Transition of Care [facility]) CM/SW Contact:    Emeterio Reeve, [place] Phone Number: 11/22/2019, 4:26 PM   Clinical Narrative:  CSW met with pt at bedside. CSW introduced self and explained her role at the hospital. Pt denied alcohol use. Pt reports occasional THC use. Pt denied needing resources at this time.   CAGE-AID Screening: Substance Abuse Screening unable to be completed due to: : Patient unable to participate  Have You Ever Felt You Ought to Cut Down on Your Drinking or Drug Use?: No Have People Annoyed You By Critizing Your Drinking Or Drug Use?: No Have You Felt Bad Or Guilty About Your Drinking Or Drug Use?: No Have You Ever Had a Drink or Used Drugs First Thing In The Morning to STeady Your Nerves or to Get Rid of a Hangover?: No CAGE-AID Score: 0  Substance Abuse Education Offered: Yes  Substance abuse interventions: Patient Counseling  Emeterio Reeve, Latanya Presser, [place] Social Worker [phone]

## 2019-11-22 NOTE — Progress Notes (Signed)
STROKE TEAM PROGRESS NOTE   INTERVAL HISTORY His father is at bedside. Pt is walking with physical therapist.  TEE yesterday confirmed right to left shunt but no PFO clearly visible on color Doppler.  He still complains of mild dizziness and has some trouble with vision .  Denies significant headache or double vision. blood pressure adequately controlled. Neurological exam is unchanged Vitals:   11/22/19 1000 11/22/19 1134 11/22/19 1200 11/22/19 1250  BP: (!) 136/101  (!) 157/96 (!) 145/98  Pulse:    91  Resp: 16   18  Temp:  98.4 F (36.9 C)  98.5 F (36.9 C)  TempSrc:  Oral  Oral  SpO2:    98%  Weight:      Height:       CBC:  Recent Labs  Lab 11/16/19 0834 11/16/19 1623 11/19/19 0524 11/20/19 0436  WBC 5.3   < > 11.4* 9.4  NEUTROABS 3.1  --   --   --   HGB 15.4   < > 14.6 14.8  HCT 45.6   < > 42.7 42.2  MCV 93.1   < > 93.8 91.1  PLT 219   < > 203 194   < > = values in this interval not displayed.   Basic Metabolic Panel:  Recent Labs  Lab 11/16/19 1623 11/17/19 0305 11/20/19 0436 11/21/19 0539  NA  --    < > 138 139  K  --    < > 3.1* 3.7  CL  --    < > 104 103  CO2  --    < > 23 26  GLUCOSE  --    < > 118* 97  BUN  --    < > 8 10  CREATININE 1.04   < > 0.72 0.95  CALCIUM  --    < > 8.6* 8.7*  MG 1.7  --   --   --   PHOS 2.0*  --   --   --    < > = values in this interval not displayed.    IMAGING past 24 hours No results found.  PHYSICAL EXAM     Obese middle-aged Caucasian male not in distress. . Afebrile. Head is nontraumatic. Neck is supple without bruit.    Cardiac exam no murmur or gallop. Lungs are clear to auscultation. Distal pulses are well felt. Neurological Exam : Drowsy but can be easily aroused.  Oriented to time place and person.  Speech is clear without dysarthria or apraxia.  Extraocular movements are full range with few beats of end gaze nystagmus on lateral gaze bilaterally.  Mild saccadic dysmetria.  Visual fields are full.  Fundi not  visualized.  Motor system exam shows symmetric upper and lower extremity strength without focal weakness.  Mild left finger-to-nose dysmetria.  No need to heel ataxia.  Gait slightly broad-based and ataxic.   ASSESSMENT/PLAN [name] is a 49 y.o. male with history of B carpal tunnel syndrome, hypertension, sleep apnea with intermittent CPAP usage, degenerative disc disease of the lumbar spine presenting with dizziness, ataxic gait along with CP.   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 Punctate L occipital infarct. Sinus dz.   CTA head & neck evolving L PICA infarct. No LVO  CT Head - 11/17/19 1231 - stable Left cerebellar infarct   CT  Head - 11/17/19 2137 -  interval suboccipital decompressive craniectomy. scattered small SAH w/in posterior fossa. subacute L cerebellar PICA territory infarct. Persistent partial effacement of the 4th ventricle. Previous lateral and third ventriculomegaly resolved.   2D Echo EF 60 to 65%  TCD Doppler w/ bubble positive for PFO at rest, not able to perform valsalva this time  LE Doppler no DVT  TEE Highly mobile intra-atrial septum, but no clear PFO or ASD flow seen despite multiple angles/windows. consider OP imaging (cardiac MRI vs. Cardiac CT) to further evaluation anatomy prior to consideration of transcatheter closure.   UDS - THC  LDL 134  HgbA1c 5.7  Hypercoagulable and autoimmune labs  negative thus far   VTE prophylaxis - Lovenox 40 mg sq daily   No antithrombotic prior to admission, now on aspirin 81 mg daily. plavix 75 on hold given recent OR. Plan DAPT for 3 weeks, then ASA alone.  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   Suboccipital Craniectomy - [name], MD - 11/17/19 - 7:34 pm  CT Head - 11/17/19 2137 -  interval suboccipital decompressive craniectomy. scattered small SAH w/in posterior fossa. subacute L cerebellar PICA territory infarct. Persistent partial effacement of the 4th ventricle. Previous lateral and third ventriculomegaly resolved.   Add depacon IV 1 gm followed by 500 ER daily for HA management  Prn tylenol, ultram, oxy  PFO  Positive TCD bubble study at rest - spencer degree III  Not able to do valsalva due to elevated ICP and not cooperative due to sleepiness  Could be the cause of current stroke  Will need outpt follow up with Dr. Excell Seltzer to consider PFO closure as outpt   Hypertension  Treated w/ cleviprex in ICU, now off  Resumed home cozaar  SBP goal < 180   slightly elevated  Added norvasc 5 . Long-term BP goal normotensive  Hyperlipidemia  Home meds:  lipitor 10  Now on lipitor 80   LDL 134, goal < 70  Continue statin at discharge  Other Stroke Risk Factors  ETOH use, alcohol level <10, advised to drink no more than 2 drink(s) a day2  Substance abuse - UDS:  THC POSITIVE. Patient advised to stop using due to stroke risk.  Morbid Obesity, Body mass index is 40.62 kg/m., recommend weight loss, diet and exercise as appropriate   Family hx stroke (father). Mother died of MI in her early 61s.   Obstructive sleep apnea, does not use CPAP at home. Refuses in hospital. Has on O2  Other Active Problems  CP, resolved - troponin < 2 -> 5  Hypokalemia 3.4->3.1-> supplement -> 3.7 (also receiving NS IV with 20 meq KCL at 75 cc /hr)    Depression/anxiety on lexapro and klonopin  Lumbar degenerative disc disease, stable  Leukocytosis 9.4 -  resolved  Lactic acidosis 5.2->2.0  Lethargic secondary to opioid pain killers mostly.  lowered Oxycodone and stopped the Dilaudid.  avoid NSAIDs due to petechial hemorrhage.   Constipation added colace 100 bid + dulcolax supp prn   Hospital day # 6 Continue mobilize out of bed and ongoing therapy consults.  Transfer  to neurology floor bed when available.  Likely transfer to inpatient rehab over the next few days.  Discussed with Dr. Excell Seltzer plan for elective PFO closure after end of inpatient rehab stay.  Long discussion with patient and his father and answered questions.  Greater than 50% time during this 35-minute visit was spent on counseling and coordination of care about his embolic stroke and PFO and discussion with care team. Delia Heady, MD To contact Stroke Continuity provider, please refer to [email or web address]. After hours, contact General Neurology

## 2019-11-22 NOTE — PMR Pre-admission (Addendum)
PMR Admission Coordinator Pre-Admission Assessment   Patient: [name] is an 49 y.o., male MRN: [id number] DOB: 03/08/1971 Height: 6' 3" (190.5 cm) Weight: (!) 147.4 kg   Insurance Information HMO:     PPO: yes     PCP:      IPA:      80/20:     OTHER:  PRIMARY: [employer]      Policy#: [id number]      Subscriber: Patient  CM Name: Mary Grace      Phone#:      Fax#: [phone] Pre-Cert#: [id number] auth for CIR provided by Mary Grace at [employer] with updates due to Juan Aranaz (ph [phone] ext [phone]) at fax listed above on 7/13.      Employer:  Benefits:  Phone #: [phone]     Name:  Eff. Date: 05/19/19     Deduct: $0      Out of Pocket Max: $500 ($205 met)      Life Max: n/a CIR: 80%      SNF: 80% Outpatient:      Co-Pay: $17/visit Home Health: 100%      Co-Pay:  DME: 100%     Co-Pay:  Providers:  SECONDARY:       Policy#:      Phone#:    The "Data Collection Information Summary" for patients in Inpatient Rehabilitation Facilities with attached "Privacy Act Statement-Health Care Records" was provided and verbally reviewed with: N/A   Emergency Contact Information                Contact Information     Name Relation Home Work Mobile     Shearman,[name] Spouse     [phone]          Current Medical History  Patient Admitting Diagnosis: CVA History of Present Illness: [name] is a 49 y.o. right-handed male with history of hypertension, sleep apnea with intermittent CPAP, hyperlipidemia, depression/anxiety, obesity with BMI 39.68.  Presented 11/16/2019 with dizziness and gait abnormality.  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  An additional punctate acute infarct question within the left occipital lobe.  CT angiogram of head and neck with no large vessel occlusion or stenosis.  Echocardiogram with ejection fraction of 65% no wall motion abnormalities.   Admission chemistries with potassium 3.3, glucose  189, troponin negative, urinalysis negative nitrite, alcohol negative, lactic acid 5.2, hemoglobin A1c 5.7.  Follow-up CT and imaging due to increasing headaches progressive somnolence showed brainstem compression and obstructive hydrocephalus with patient undergoing suboccipital craniectomy 11/17/2019 per Dr. Ostergaard.  Neurology follow-up currently maintained on aspirin for CVA prophylaxis.  Subcutaneous Lovenox for DVT prophylaxis.  Patient has been on Cleviprex for blood pressure control.  Tolerating a regular diet.  Therapy evaluations completed with recommendations of physical medicine rehab consult.   Complete NIHSS TOTAL: (P) 0   Patient's medical record from [facility] has been reviewed by the rehabilitation admission coordinator and physician.   Past Medical History         Past Medical History:  Diagnosis Date  . Arthritis      neck  . Carpal tunnel syndrome on both sides 08/2014  . DDD (degenerative disc disease), lumbar    . History of kidney stones    . Hypertension      under control with med., has been on med. x 1 yr.  . Sleep apnea      CPAP   but does not use      Family History   family history is not on file.   Prior Rehab/Hospitalizations Has the patient had prior rehab or hospitalizations prior to admission? No   Has the patient had major surgery during 100 days prior to admission? Yes              Current Medications   Current Facility-Administered Medications:  .   stroke: mapping our early stages of recovery book, , Does not apply, Once, Christopher, Bridgette, MD .  0.9 %  sodium chloride infusion, , Intravenous, Continuous, Ingold, Laura R, NP .  0.9 % NaCl with KCl 20 mEq/ L  infusion, , Intravenous, Continuous, Christopher, Bridgette, MD, Last Rate: 75 mL/hr at 11/22/19 0800, Rate Verify at 11/22/19 0800 .  acetaminophen (TYLENOL) tablet 650 mg, 650 mg, Oral, Q6H PRN, 650 mg at 11/21/19 1303 **OR** acetaminophen (TYLENOL) suppository 650 mg, 650  mg, Rectal, Q6H PRN, Christopher, Bridgette, MD .  amLODipine (NORVASC) tablet 5 mg, 5 mg, Oral, Daily, Biby, Sharon L, NP, 5 mg at 11/22/19 0927 .  aspirin EC tablet 81 mg, 81 mg, Oral, Daily, Christopher, Bridgette, MD, 81 mg at 11/22/19 0927 .  atorvastatin (LIPITOR) tablet 80 mg, 80 mg, Oral, Daily, Christopher, Bridgette, MD, 80 mg at 11/22/19 0927 .  bisacodyl (DULCOLAX) suppository 10 mg, 10 mg, Rectal, Daily PRN, Christopher, Bridgette, MD .  butalbital-acetaminophen-caffeine (FIORICET) 50-325-40 MG per tablet 2 tablet, 2 tablet, Oral, Q6H PRN, Christopher, Bridgette, MD, 2 tablet at 11/22/19 0410 .  Chlorhexidine Gluconate Cloth 2 % PADS 6 each, 6 each, Topical, Daily, Sethi, Pramod S, MD, 6 each at 11/22/19 1107 .  clonazePAM (KLONOPIN) tablet 0.5 mg, 0.5 mg, Oral, PRN, Christopher, Bridgette, MD, 0.5 mg at 11/21/19 2247 .  [COMPLETED] valproate (DEPACON) 500 mg in dextrose 5 % 50 mL IVPB, 500 mg, Intravenous, Once, Last Rate: 55 mL/hr at 11/21/19 1100, Rate Verify at 11/21/19 1100 **FOLLOWED BY** divalproex (DEPAKOTE ER) 24 hr tablet 500 mg, 500 mg, Oral, Daily, Christopher, Bridgette, MD, 500 mg at 11/22/19 0927 .  docusate sodium (COLACE) capsule 100 mg, 100 mg, Oral, BID, Christopher, Bridgette, MD, 100 mg at 11/21/19 1303 .  enoxaparin (LOVENOX) injection 40 mg, 40 mg, Subcutaneous, Daily, Christopher, Bridgette, MD, 40 mg at 11/22/19 0929 .  escitalopram (LEXAPRO) tablet 10 mg, 10 mg, Oral, Daily, Christopher, Bridgette, MD, 10 mg at 11/21/19 1302 .  labetalol (NORMODYNE) injection 10 mg, 10 mg, Intravenous, Q4H PRN, Christopher, Bridgette, MD, 10 mg at 11/20/19 1842 .  losartan (COZAAR) tablet 50 mg, 50 mg, Oral, Daily, Christopher, Bridgette, MD, 50 mg at 11/22/19 0927 .  meclizine (ANTIVERT) tablet 25 mg, 25 mg, Oral, TID PRN, Christopher, Bridgette, MD, 25 mg at 11/19/19 1658 .  MEDLINE mouth rinse, 15 mL, Mouth Rinse, BID, Christopher, Bridgette, MD, 15 mL at 11/22/19 1109 .   ondansetron (ZOFRAN) tablet 4 mg, 4 mg, Oral, Q6H PRN **OR** ondansetron (ZOFRAN) injection 4 mg, 4 mg, Intravenous, Q6H PRN, Christopher, Bridgette, MD, 4 mg at 11/18/19 2220 .  oxyCODONE (Oxy IR/ROXICODONE) immediate release tablet 5 mg, 5 mg, Oral, Q4H PRN, Christopher, Bridgette, MD, 5 mg at 11/22/19 0410 .  promethazine (PHENERGAN) injection 25 mg, 25 mg, Intravenous, Q6H PRN, Christopher, Bridgette, MD .  traMADol (ULTRAM) tablet 50 mg, 50 mg, Oral, Q6H PRN, Christopher, Bridgette, MD, 50 mg at 11/22/19 1401   Patients Current Diet:       Diet Order                             Diet Heart Room service appropriate? Yes with Assist; Fluid consistency: Thin  Diet effective now                          Precautions / Restrictions Precautions Precautions: Fall Precaution Comments: sensitive to light Restrictions Weight Bearing Restrictions: No    Has the patient had 2 or more falls or a fall with injury in the past year? No   Prior Activity Level  Pt. Was working and driving and independent with all ADLs prior to admission.    Prior Functional Level Self Care: Did the patient need help bathing, dressing, using the toilet or eating? Independent   Indoor Mobility: Did the patient need assistance with walking from room to room (with or without device)? Independent   Stairs: Did the patient need assistance with internal or external stairs (with or without device)? Independent   Functional Cognition: Did the patient need help planning regular tasks such as shopping or remembering to take medications? Independent   Home Assistive Devices / Equipment Home Assistive Devices/Equipment: None Home Equipment: Walker - 4 wheels   Prior Device Use: Indicate devices/aids used by the patient prior to current illness, exacerbation or injury? None of the above   Current Functional Level Cognition   Overall Cognitive Status: Impaired/Different from baseline Current Attention Level:  Selective Orientation Level: (P) Oriented X4 Following Commands: Follows one step commands consistently Safety/Judgement: Decreased awareness of safety, Decreased awareness of deficits General Comments: decreased self awareness    Extremity Assessment (includes Sensation/Coordination)   Upper Extremity Assessment: Overall WFL for tasks assessed  Lower Extremity Assessment: Defer to PT evaluation     ADLs   Overall ADL's : Needs assistance/impaired Eating/Feeding: Set up, Sitting Grooming: Set up, Sitting Upper Body Bathing: Set up, Supervision/ safety, Sitting Lower Body Bathing: Moderate assistance, Cueing for safety, Sit to/from stand Upper Body Dressing : Minimal assistance, Sitting Upper Body Dressing Details (indicate cue type and reason): limited by neck pain Lower Body Dressing: Moderate assistance, Cueing for safety, Sit to/from stand Lower Body Dressing Details (indicate cue type and reason): able to complete figure four position to donn socks/underwear Toilet Transfer: Cueing for safety, Minimal assistance Toileting- Clothing Manipulation and Hygiene: Minimal assistance, Cueing for safety, Sit to/from stand Functional mobility during ADLs: Minimal assistance, Cueing for safety, Rolling walker, Cueing for sequencing General ADL Comments: Cues to open eyes for ADL     Mobility   Overal bed mobility: Needs Assistance Bed Mobility: Supine to Sit Supine to sit: Supervision Sit to supine: Supervision General bed mobility comments: Pt received in recliner and returned to recliner.     Transfers   Overall transfer level: Needs assistance Equipment used: Rolling walker (2 wheeled) Transfers: Sit to/from Stand Sit to Stand: Min assist Stand pivot transfers: Min assist General transfer comment: pt utilizing railing in bathroom to pull up into standing, instability noted both anterior-posterior and laterally     Ambulation / Gait / Stairs / Wheelchair Mobility    Ambulation/Gait Ambulation/Gait assistance: Mod assist Gait Distance (Feet): 150 Feet Assistive device: Rolling walker (2 wheeled) Gait Pattern/deviations: Step-to pattern General Gait Details: pt with short step to gait, instability noted in all directions, pt does demonstrate improved awareness of instability, stopping ambulation and dual-task activities multiple times to regain balance Gait velocity: reduced Gait velocity interpretation: <1.8 ft/sec, indicate of risk for recurrent falls     Posture / Balance Dynamic Sitting Balance Sitting balance - Comments: reliant on   UE support, requires supervision Balance Overall balance assessment: Needs assistance Sitting-balance support: Single extremity supported, Feet supported Sitting balance-Leahy Scale: Poor Sitting balance - Comments: reliant on UE support, requires supervision Standing balance support: Bilateral upper extremity supported Standing balance-Leahy Scale: Poor Standing balance comment: reliant on BUE support and minA due to increased sway     Special needs/care consideration Skin: Surgical incision to the scalp and Designated visitor [name] Cunnington (spouse)    Previous Home Environment (from acute therapy documentation) Living Arrangements: Spouse/significant other Available Help at Discharge: Family, Available 24 hours/day Type of Home: House Home Layout: One level Home Access: Stairs to enter Entrance Stairs-Rails: Right Entrance Stairs-Number of Steps: 3 Bathroom Shower/Tub: Tub/shower unit Bathroom Toilet: Standard Home Care Services: No   Discharge Living Setting Plans for Discharge Living Setting: Patient's home Type of Home at Discharge: House Discharge Home Layout: One level Discharge Home Access: Stairs to enter Entrance Stairs-Rails: Right Entrance Stairs-Number of Steps: 3 Discharge Bathroom Shower/Tub: Tub/shower unit Discharge Bathroom Toilet: Standard Discharge Bathroom Accessibility: Yes How  Accessible: Accessible via walker Does the patient have any problems obtaining your medications?: No   Social/Family/Support Systems Patient Roles: Spouse Contact Information: [phone] Anticipated Caregiver: [name] Schertzer Anticipated Caregiver's Contact Information: [phone] Ability/Limitations of Caregiver: None  Caregiver Availability: 24/7 Discharge Plan Discussed with Primary Caregiver: Yes Is Caregiver In Agreement with Plan?: Yes Does Caregiver/Family have Issues with Lodging/Transportation while Pt is in Rehab?: No   Goals Patient/Family Goal for Rehab: PT/OT/SLP Supervision  Expected length of stay:10-14 days   Pt/Family Agrees to Admission and willing to participate: Yes Program Orientation Provided & Reviewed with Pt/Caregiver Including Roles  & Responsibilities: Yes   Decrease burden of Care through IP rehab admission: Specialzed equipment needs, Decrease number of caregivers and Patient/family education   Possible need for SNF placement upon discharge: Not anticipated   Patient Condition: I have reviewed medical records from [facility], spoken with CM, and patient and spouse. I met with patient at the bedside for inpatient rehabilitation assessment.  Patient will benefit from ongoing PT, OT and SLP, can actively participate in 3 hours of therapy a day 5 days of the week, and can make measurable gains during the admission.  Patient will also benefit from the coordinated team approach during an Inpatient Acute Rehabilitation admission.  The patient will receive intensive therapy as well as Rehabilitation physician, nursing, social worker, and care management interventions.  Due to safety, skin/wound care, disease management, medication administration, pain management and patient education the patient requires 24 hour a day rehabilitation nursing.  The patient is currently Min assist to Mod assist** with mobility and basic ADLs.  Discharge setting and  therapy post discharge at home with home health is anticipated.  Patient has agreed to participate in the Acute Inpatient Rehabilitation Program and will admit today.   Preadmission Screen Completed By:  Caitlin E Warren, 11/22/2019 2:41 PM ______________________________________________________________________   Discussed status with Dr. Donise Woodle on 11/22/19 at 2:51 PM  and received approval for admission today.   Admission Coordinator:  Caitlin E Warren, PT, DPT time 2:51 PM /Date 11/22/19       

## 2019-11-22 NOTE — TOC Transition Note (Signed)
Transition of Care [facility]) - CM/SW Discharge Note   Patient Details  Name: [name] MRN: [id number] Date of Birth: 07/04/1970  [facility] CM/SW Contact:  Kermit Balo, RN Phone Number: 11/22/2019, 3:02 PM   Clinical Narrative:    Pt discharging to CIR today. CM signing off.   Final next level of care: IP Rehab Facility Barriers to Discharge: No Barriers Identified   Patient Goals and CMS Choice        Discharge Placement                       Discharge Plan and Services                                     Social Determinants of Health (SDOH) Interventions     Readmission Risk Interventions No flowsheet data found.

## 2019-11-22 NOTE — Progress Notes (Signed)
Patient VS show temp of 100.6. Initial VS on unit, temp was 100.1. Patient complaining of generalized pain and weakness. Tylenol PRN order for temp >/= 101. This nurse called on-call provider to notify of temperature. Riley Lam, NP ordered urinalysis and urine culture, blood culture, and chest x-ray. Provider also ordered PRN tylenol to be given. Urine specimen collected, tylenol given. Patient's temp down to 99.0 at 21:18.

## 2019-11-22 NOTE — Progress Notes (Signed)
Physical Therapy Treatment Patient Details Name: [name] MRN: [id number] DOB: 31-May-1970 Today's Date: 11/22/2019    History of Present Illness 49 y.o. male with medical history significant of hypertension, hyperlipidemia, depression/anxiety, degenerative disc disease in lumbar region, obesity presents to emergency department for the evaluation of dizziness. While he was driving on 7/1 he felt dizzy and started having nausea and vomiting and diaphoresis, dizziness is worse with eyes open. MRI demonstrates L PICA infarct. Pt with increased HA and somnolence on 7/2. Repeat head CT demonstrating suboccipital hydrocephalus. Pt underwent suboccipital decompression on 7/2.    PT Comments    Pt tolerates treatment well with improved awareness of deficits this session. Pt remains unstable during all standing activity and ambulation. Pt attempts to initiate some dual-tasking activities of head turns with ambulation, performing horizontal head turns but then declining attempts at vertical head turns due to instability and fear of falling. Pt will benefit from continued acute PT services to improve gait and balance quality and to further reduce falls risk. PT continues to recommend CIR at this time.   Follow Up Recommendations  CIR     Equipment Recommendations  Rolling walker with 5" wheels    Recommendations for Other Services       Precautions / Restrictions Precautions Precautions: Fall Restrictions Weight Bearing Restrictions: No    Mobility  Bed Mobility                  Transfers Overall transfer level: Needs assistance Equipment used: Rolling walker (2 wheeled) Transfers: Sit to/from Stand Sit to Stand: Min assist         General transfer comment: pt utilizing railing in bathroom to pull up into standing, instability noted both anterior-posterior and laterally  Ambulation/Gait Ambulation/Gait assistance: Mod assist Gait Distance (Feet): 150 Feet Assistive  device: Rolling walker (2 wheeled) Gait Pattern/deviations: Step-to pattern Gait velocity: reduced Gait velocity interpretation: <1.8 ft/sec, indicate of risk for recurrent falls General Gait Details: pt with short step to gait, instability noted in all directions, pt does demonstrate improved awareness of instability, stopping ambulation and dual-task activities multiple times to regain balance   Stairs             Wheelchair Mobility    Modified Rankin (Stroke Patients Only) Modified Rankin (Stroke Patients Only) Pre-Morbid Rankin Score: No symptoms Modified Rankin: Moderately severe disability     Balance Overall balance assessment: Needs assistance Sitting-balance support: Single extremity supported;Feet supported Sitting balance-Leahy Scale: Poor Sitting balance - Comments: reliant on UE support, requires supervision   Standing balance support: Bilateral upper extremity supported Standing balance-Leahy Scale: Poor Standing balance comment: reliant on BUE support and minA due to increased sway                            Cognition Arousal/Alertness: Awake/alert Behavior During Therapy: Flat affect Overall Cognitive Status: Impaired/Different from baseline Area of Impairment: Memory;Following commands;Safety/judgement;Awareness;Problem solving                   Current Attention Level: Selective Memory: Decreased recall of precautions Following Commands: Follows one step commands consistently Safety/Judgement: Decreased awareness of safety;Decreased awareness of deficits Awareness: Emergent Problem Solving: Slow processing;Requires verbal cues;Difficulty sequencing        Exercises      General Comments General comments (skin integrity, edema, etc.): VSS, pt does report some increased blurriness in peripheral vision while ambulation, reports resolution of this at end  of walk      Pertinent Vitals/Pain Pain Assessment: Faces Faces Pain  Scale: Hurts even more Pain Location: incision site and posterior head Pain Descriptors / Indicators: Grimacing Pain Intervention(s): Monitored during session    Home Living                      Prior Function            PT Goals (current goals can now be found in the care plan section) Acute Rehab PT Goals Patient Stated Goal: return to baseline independence Progress towards PT goals: Progressing toward goals    Frequency    Min 4X/week      PT Plan Current plan remains appropriate    Co-evaluation              AM-PAC PT "6 Clicks" Mobility   Outcome Measure  Help needed turning from your back to your side while in a flat bed without using bedrails?: None Help needed moving from lying on your back to sitting on the side of a flat bed without using bedrails?: None Help needed moving to and from a bed to a chair (including a wheelchair)?: A Little Help needed standing up from a chair using your arms (e.g., wheelchair or bedside chair)?: A Little Help needed to walk in hospital room?: A Lot Help needed climbing 3-5 steps with a railing? : Total 6 Click Score: 17    End of Session   Activity Tolerance: Patient tolerated treatment well Patient left: in chair;with call bell/phone within reach;with family/visitor present;with chair alarm set Nurse Communication: Mobility status PT Visit Diagnosis: Unsteadiness on feet (R26.81);Other abnormalities of gait and mobility (R26.89)     Time: 3428-7681 PT Time Calculation (min) (ACUTE ONLY): 23 min  Charges:  $Gait Training: 8-22 mins $Therapeutic Activity: 8-22 mins                     Arlyss Gandy, PT, DPT Acute Rehabilitation Pager: [phone]    Arlyss Gandy 11/22/2019, 9:31 AM

## 2019-11-22 NOTE — Progress Notes (Signed)
Pt arrived to unit via bed, pt is alert with flat affect, pt toileted when arrived and used walker with unsteady gait. Pt insisted using walker only, education provided, wife at bedside, oriented both to rehab.

## 2019-11-22 NOTE — Progress Notes (Signed)
Physical Medicine and Rehabilitation Consult Reason for Consult: Dizziness with gait abnormality Referring Physician: Dr.Xu     HPI: [name] is a 49 y.o. right-handed male with history of hypertension, sleep apnea with intermittent CPAP, hyperlipidemia, depression/anxiety, obesity with BMI 39.68.  Per chart review patient lives with spouse.  He works as a Psychologist, occupationalwelder.  1 level home 3 steps to entry.  Independent prior to admission.  Presented 11/16/2019 with dizziness and gait abnormality.  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  An additional punctate acute infarct question within the left occipital lobe.  CT angiogram of head and neck with no large vessel occlusion or stenosis.  Echocardiogram with ejection fraction of 65% no wall motion abnormalities.   Admission chemistries with potassium 3.3, glucose 189, troponin negative, urinalysis negative nitrite, alcohol negative, lactic acid 5.2, hemoglobin A1c 5.7.  Follow-up CT and imaging due to increasing headaches progressive somnolence showed brainstem compression and obstructive hydrocephalus with patient undergoing suboccipital craniectomy 11/17/2019 per Dr. Johnsie Cancelstergaard.  Neurology follow-up currently maintained on aspirin for CVA prophylaxis.  Subcutaneous Lovenox for DVT prophylaxis.  Patient has been on Cleviprex for blood pressure control.  Tolerating a regular diet.  Therapy evaluations completed with recommendations of physical medicine rehab consult.     Review of Systems  Constitutional: Negative for chills and fever.  HENT: Negative for hearing loss.   Eyes: Negative for blurred vision and double vision.  Respiratory: Negative for cough and shortness of breath.   Cardiovascular: Negative for chest pain, palpitations and leg swelling.  Gastrointestinal: Positive for constipation. Negative for heartburn, nausea and vomiting.  Genitourinary: Negative for dysuria and  flank pain.  Musculoskeletal: Positive for back pain and myalgias.  Skin: Negative for rash.  Neurological: Positive for dizziness and headaches.  All other systems reviewed and are negative.       Past Medical History:  Diagnosis Date  . Arthritis      neck  . Carpal tunnel syndrome on both sides 08/2014  . DDD (degenerative disc disease), lumbar    . History of kidney stones    . Hypertension      under control with med., has been on med. x 1 yr.  . Sleep apnea      CPAP but does not use         Past Surgical History:  Procedure Laterality Date  . CARPAL TUNNEL RELEASE Left 09/07/2014    Procedure: LEFT CARPAL TUNNEL RELEASE;  Surgeon: Dominica SeverinWilliam Gramig, MD;  Location: [facility];  Service: Orthopedics;  Laterality: Left;  . CARPAL TUNNEL RELEASE Right 10/12/2014    Procedure: RIGHT CARPAL TUNNEL RELEASE;  Surgeon: Dominica SeverinWilliam Gramig, MD;  Location: [facility];  Service: Orthopedics;  Laterality: Right;  . KNEE ARTHROSCOPY Left      History reviewed. No pertinent family history. Social History:  reports that he has never smoked. He has never used smokeless tobacco. He reports current alcohol use. He reports that he does not use drugs. Allergies: No Known Allergies       Medications Prior to Admission  Medication Sig Dispense Refill  . atorvastatin (LIPITOR) 10 MG tablet Take 10 mg by mouth daily.      . clonazePAM (KLONOPIN) 0.5 MG tablet Take 0.5 mg by mouth as needed (sleep).       Marland Kitchen. escitalopram (LEXAPRO) 10 MG tablet Take 10 mg by mouth  daily.      . losartan (COZAAR) 50 MG tablet Take 50 mg by mouth daily.      Marland Kitchen HYDROcodone-acetaminophen (NORCO/VICODIN) 5-325 MG tablet Take 1 tablet by mouth every 4 (four) hours as needed. (Patient not taking: Reported on 07/31/2019) 12 tablet 0  . ibuprofen (ADVIL,MOTRIN) 800 MG tablet Take 1 tablet (800 mg total) by mouth every 8 (eight) hours as needed for moderate pain. (Patient not taking: Reported on 11/16/2019)  15 tablet 0  . ondansetron (ZOFRAN ODT) 4 MG disintegrating tablet Take 1 tablet (4 mg total) by mouth every 8 (eight) hours as needed for nausea. (Patient not taking: Reported on 07/31/2019) 10 tablet 0  . oxyCODONE-acetaminophen (PERCOCET) 5-325 MG tablet Take 1 tablet by mouth every 4 (four) hours as needed for moderate pain. (Patient not taking: Reported on 07/31/2019) 20 tablet 0      Home: Home Living Family/patient expects to be discharged to:: Private residence Living Arrangements: Spouse/significant other Available Help at Discharge: Family, Available 24 hours/day Type of Home: House Home Access: Stairs to enter [employer] of Steps: 3 Entrance Stairs-Rails: Right Home Layout: One level Bathroom Shower/Tub: Engineer, manufacturing systems: Standard Home Equipment: Environmental consultant - 4 wheels  Functional History: Prior Function Level of Independence: Independent Comments: works as a IT trainer Status:  Mobility: Bed Mobility Overal bed mobility: Needs Assistance Bed Mobility: Supine to Sit Supine to sit: Supervision, HOB elevated Transfers Overall transfer level: Needs assistance Equipment used: Rolling walker (2 wheeled) Transfers: Sit to/from Stand, [employer] Transfers Sit to Stand: Min guard Stand pivot transfers: Mod assist General transfer comment: pt with lateral instability requiring physical assistance to prevent fall Ambulation/Gait Ambulation/Gait assistance: Mod assist Gait Distance (Feet): 4 Feet Assistive device: Rolling walker (2 wheeled) Gait Pattern/deviations: Step-to pattern, Drifts right/left General Gait Details: pt with lateral instability, requires physical assistance to prevent a fall Gait velocity: reduced Gait velocity interpretation: <1.8 ft/sec, indicate of risk for recurrent falls   ADL: ADL Overall ADL's : Needs assistance/impaired Eating/Feeding: Set up, Sitting Grooming: Minimal assistance, Standing, Cueing for safety,  Cueing for sequencing Upper Body Bathing: Minimal assistance, Sitting Lower Body Bathing: Moderate assistance, Sit to/from stand, Sitting/lateral leans, Cueing for sequencing, Cueing for safety Upper Body Dressing : Minimal assistance, Sitting Lower Body Dressing: Moderate assistance, Sit to/from stand, Sitting/lateral leans, Cueing for safety, Cueing for sequencing Toilet Transfer: Moderate assistance, Cueing for safety, RW, Regular Toilet, Grab bars Toileting- Clothing Manipulation and Hygiene: Minimal assistance, Sit to/from stand, Sitting/lateral lean Functional mobility during ADLs: Moderate assistance, Cueing for safety, Cueing for sequencing, Rolling walker   Cognition: Cognition Overall Cognitive Status: Impaired/Different from baseline Orientation Level: Oriented X4 Cognition Arousal/Alertness: Lethargic, Suspect due to medications Behavior During Therapy: Flat affect Overall Cognitive Status: Impaired/Different from baseline Area of Impairment: Memory, Safety/judgement, Awareness, Problem solving Memory: Decreased short-term memory Safety/Judgement: Decreased awareness of deficits Awareness: Emergent Problem Solving: Slow processing, Requires verbal cues General Comments: pt recieved pain medications prior to session, quite drowsy. Noted deficits with problem solving basic mobility tasks   Blood pressure 121/76, pulse 95, temperature 98.9 F (37.2 C), temperature source Oral, resp. rate 16, height 6\' 3"  (1.905 m), weight (!) 144 kg, SpO2 95 %.   Physical Exam General: Alert and oriented x 3, No apparent distress HEENT: Head is normocephalic, atraumatic, PERRLA, EOMI, sclera anicteric, oral mucosa pink and moist, dentition intact, ext ear canals clear,  Neck: Supple without JVD or lymphadenopathy Heart: Reg rate and rhythm. No murmurs rubs or  gallops Chest: CTA bilaterally without wheezes, rales, or rhonchi; no distress Abdomen: Soft, non-tender, non-distended, bowel sounds  positive. Extremities: No clubbing, cyanosis, or edema. Pulses are 2+ Skin: Craniectomy site is dressed.  Neuro: Patient is drowsy but arousable.  Makes eye contact with examiner.  Does provide his name and age but would easily fall back asleep.  Follows simple commands. 5/5 strength throughout except RLE 0/5 DF (chronic as per patient). Sensation is intact. Able to feet oatmeal to himself.  Psych: Pt's affect is appropriate. Pt is cooperative      Lab Results Last 24 Hours       Results for orders placed or performed during the hospital encounter of 11/16/19 (from the past 24 hour(s))  CBC     Status: None    Collection Time: 11/20/19  4:36 AM  Result Value Ref Range    WBC 9.4 4.0 - 10.5 K/uL    RBC 4.63 4.22 - 5.81 MIL/uL    Hemoglobin 14.8 13.0 - 17.0 g/dL    HCT 20.2 39 - 52 %    MCV 91.1 80.0 - 100.0 fL    MCH 32.0 26.0 - 34.0 pg    MCHC 35.1 30.0 - 36.0 g/dL    RDW 54.2 70.6 - 23.7 %    Platelets 194 150 - 400 K/uL    nRBC 0.0 0.0 - 0.2 %  Basic metabolic panel     Status: Abnormal    Collection Time: 11/20/19  4:36 AM  Result Value Ref Range    Sodium 138 135 - 145 mmol/L    Potassium 3.1 (L) 3.5 - 5.1 mmol/L    Chloride 104 98 - 111 mmol/L    CO2 23 22 - 32 mmol/L    Glucose, Bld 118 (H) 70 - 99 mg/dL    BUN 8 6 - 20 mg/dL    Creatinine, Ser 6.28 0.61 - 1.24 mg/dL    Calcium 8.6 (L) 8.9 - 10.3 mg/dL    GFR calc non Af Amer >60 >60 mL/min    GFR calc Af Amer >60 >60 mL/min    Anion gap 11 5 - 15  Triglycerides     Status: None    Collection Time: 11/20/19  4:36 AM  Result Value Ref Range    Triglycerides 122 <150 mg/dL      Imaging Results (Last 48 hours)  No results found.       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 Does the need for close, 24 hr/day medical supervision in concert with the patient's rehab needs make it unreasonable for this patient to be served in a less intensive setting?  Yes 2. Co-Morbidities requiring supervision/potential complications: headache, vertigo, R foot drop, tachycardia, post-operative pain, nausea, hypokalemia 3. Due to bladder management, bowel management, safety, skin/wound care, disease management, medication administration, pain management and patient education, does the patient require 24 hr/day rehab nursing? Yes 4. Does the patient require coordinated care of a physician, rehab nurse, therapy disciplines of PT, OT, SLP to address physical and functional deficits in the context of the above medical diagnosis(es)? Yes Addressing deficits in the following areas: balance, endurance, locomotion, strength, transferring, bowel/bladder control, bathing, dressing, feeding, grooming, toileting, cognition and psychosocial support 5. Can the patient actively participate in an intensive therapy program of at least 3 hrs of therapy per day at least 5 days per week? Yes 6. The potential for patient to make measurable gains while on inpatient rehab is excellent  7. Anticipated functional outcomes upon discharge from inpatient rehab are modified independent  with PT, modified independent with OT, modified independent with SLP. 8. Estimated rehab length of stay to reach the above functional goals is: 10-14 days 9. Anticipated discharge destination: Home 10. Overall Rehab/Functional Prognosis: excellent   RECOMMENDATIONS: This patient's condition is appropriate for continued rehabilitative care in the following setting: CIR Patient has agreed to participate in recommended program. Yes Note that insurance prior authorization may be required for reimbursement for recommended care.   Comment: [name] is an excellent CIR candidate. He lives with his wife. He will also have the support of his parents. Can consider addition of cold packs to forehead to alleviate headaches.    Mcarthur Rossetti Angiulli, [name] 11/20/2019    I have personally performed a face to face diagnostic  evaluation, including, but not limited to relevant history and physical exam findings, of this patient and developed relevant assessment and plan.  Additionally, I have reviewed and concur with the physician assistant's documentation above.   Sula Soda, MD

## 2019-11-22 NOTE — Progress Notes (Signed)
Patient arrived and oriented to unit, spouse at bedside. Verbalized willingness to call for assistance. Is interested in neuropsych consult for symptoms of depression related to stroke.

## 2019-11-22 NOTE — Progress Notes (Signed)
Patient has home unit set up in his room.  Patient self manages machine.  Wife had already put sterile water in machine for patient, and it is bedside for patient use.

## 2019-11-22 NOTE — H&P (Signed)
Physical Medicine and Rehabilitation Admission H&P    Chief Complaint  Patient presents with   Chest Pain  : HPI: [name] is a 49 year old right-handed male history of hypertension, sleep apnea with intermittent CPAP, hyperlipidemia, depression with anxiety, obesity with BMI 39.68.  History taken from chart review, family, and patient due to lethargy mentation.  Patient lives with spouse.  He works as a Psychologist, occupational.  1 level home 3 steps to entry.  He presented on 11/16/2019 with dizziness and gait abnormality.  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  Additional punctate acute infarct question within the left occipital lobe.  CT angiogram of head and neck negative for large vessel occlusion or stenosis.  Echocardiogram with ejection fraction of 65%, normal wall abnormalities.  TCD Doppler with bubble positive for PFO at rest.  TEE highly mobile intra-atrial septum.  But no clear PFO or ASD flow seen despite multiple angle windows.  Admission chemistries potassium 3.3, glucose 189 troponin negative alcohol negative lactic acid 5.2.  Hospital course further complicated by increasing headaches and lethargy.  Follow-up CT imaging showed brainstem compression and obstructive hydrocephalus with patient undergoing suboccipital craniotomy on 11/17/2019 per Dr. Johnsie Cancel.  Neurology follow-up and patient was cleared to begin aspirin and Plavix for CVA prophylaxis x3 weeks and aspirin alone.  Patient would follow-up Dr. Excell Seltzer to consider PFO closure as an outpatient.  Patient was cleared to begin subcutaneous Lovenox for DVT prophylaxis.  Maintained on Depakote for persistent headaches.  Close monitoring of blood pressure initially on Cleviprex.  Tolerating a regular diet.  Hospital course further complicated by leukocytosis. Patient was admitted for a comprehensive rehab program.  Please see preadmission assessment from earlier today as  well.  Review of Systems  Constitutional: Positive for malaise/fatigue. Negative for chills and fever.  HENT: Negative for hearing loss.   Eyes: Negative for blurred vision and double vision.  Respiratory: Negative for cough and shortness of breath.   Cardiovascular: Negative for chest pain, palpitations and leg swelling.  Gastrointestinal: Positive for constipation. Negative for heartburn, nausea and vomiting.  Genitourinary: Negative for dysuria, flank pain and hematuria.  Musculoskeletal: Positive for back pain and myalgias.  Skin: Negative for rash.  Neurological: Positive for dizziness, weakness and headaches. Negative for sensory change.  Psychiatric/Behavioral: Positive for depression.       Anxiety  All other systems reviewed and are negative.  Past Medical History:  Diagnosis Date   Arthritis    neck   Carpal tunnel syndrome on both sides 08/2014   DDD (degenerative disc disease), lumbar    History of kidney stones    Hypertension    under control with med., has been on med. x 1 yr.   Sleep apnea    CPAP but does not use   Past Surgical History:  Procedure Laterality Date   CARPAL TUNNEL RELEASE Left 09/07/2014   Procedure: LEFT CARPAL TUNNEL RELEASE;  Surgeon: [name], MD;  Location: [facility];  Service: Orthopedics;  Laterality: Left;   CARPAL TUNNEL RELEASE Right 10/12/2014   Procedure: RIGHT CARPAL TUNNEL RELEASE;  Surgeon: [name], MD;  Location:  SURGERY CENTER;  Service: Orthopedics;  Laterality: Right;   KNEE ARTHROSCOPY Left    SUBOCCIPITAL CRANIECTOMY CERVICAL LAMINECTOMY N/A 11/17/2019   Procedure: SUBOCCIPITAL DECOMPRESSIVE CRANIECTOMY;  Surgeon: [name], MD;  Location: [facility];  Service: Neurosurgery;  Laterality: N/A;   History reviewed. No  pertinent family history. Social History:  reports that he has never smoked. He has never used smokeless tobacco. He reports previous alcohol use. He reports that  he does not use drugs. Allergies: No Known Allergies Medications Prior to Admission  Medication Sig Dispense Refill   atorvastatin (LIPITOR) 10 MG tablet Take 10 mg by mouth daily.     clonazePAM (KLONOPIN) 0.5 MG tablet Take 0.5 mg by mouth as needed (sleep).      escitalopram (LEXAPRO) 10 MG tablet Take 10 mg by mouth daily.     losartan (COZAAR) 50 MG tablet Take 50 mg by mouth daily.     HYDROcodone-acetaminophen (NORCO/VICODIN) 5-325 MG tablet Take 1 tablet by mouth every 4 (four) hours as needed. (Patient not taking: Reported on 07/31/2019) 12 tablet 0   ibuprofen (ADVIL,MOTRIN) 800 MG tablet Take 1 tablet (800 mg total) by mouth every 8 (eight) hours as needed for moderate pain. (Patient not taking: Reported on 11/16/2019) 15 tablet 0   ondansetron (ZOFRAN ODT) 4 MG disintegrating tablet Take 1 tablet (4 mg total) by mouth every 8 (eight) hours as needed for nausea. (Patient not taking: Reported on 07/31/2019) 10 tablet 0   oxyCODONE-acetaminophen (PERCOCET) 5-325 MG tablet Take 1 tablet by mouth every 4 (four) hours as needed for moderate pain. (Patient not taking: Reported on 07/31/2019) 20 tablet 0    Drug Regimen Review Drug regimen was reviewed and remains appropriate with no significant issues identified  Home: Home Living Family/patient expects to be discharged to:: Private residence Living Arrangements: Spouse/significant other Available Help at Discharge: Family, Available 24 hours/day Type of Home: House Home Access: Stairs to enter [employer] of Steps: 3 Entrance Stairs-Rails: Right Home Layout: One level Bathroom Shower/Tub: Engineer, manufacturing systems: Standard Home Equipment: Environmental consultant - 4 wheels   Functional History: Prior Function Level of Independence: Independent Comments: works as a Media planner Status:  Mobility: Bed Mobility Overal bed mobility: Needs Assistance Bed Mobility: Supine to Sit Supine to sit: Supervision Sit to  supine: Supervision General bed mobility comments: Pt received in recliner and returned to recliner. Transfers Overall transfer level: Needs assistance Equipment used: Rolling walker (2 wheeled) Transfers: Sit to/from Stand Sit to Stand: Min assist Stand pivot transfers: Min assist General transfer comment: pt utilizing railing in bathroom to pull up into standing, instability noted both anterior-posterior and laterally Ambulation/Gait Ambulation/Gait assistance: Mod assist Gait Distance (Feet): 150 Feet Assistive device: Rolling walker (2 wheeled) Gait Pattern/deviations: Step-to pattern General Gait Details: pt with short step to gait, instability noted in all directions, pt does demonstrate improved awareness of instability, stopping ambulation and dual-task activities multiple times to regain balance Gait velocity: reduced Gait velocity interpretation: <1.8 ft/sec, indicate of risk for recurrent falls    ADL: ADL Overall ADL's : Needs assistance/impaired Eating/Feeding: Set up, Sitting Grooming: Set up, Sitting Upper Body Bathing: Set up, Supervision/ safety, Sitting Lower Body Bathing: Moderate assistance, Cueing for safety, Sit to/from stand Upper Body Dressing : Minimal assistance, Sitting Upper Body Dressing Details (indicate cue type and reason): limited by neck pain Lower Body Dressing: Moderate assistance, Cueing for safety, Sit to/from stand Lower Body Dressing Details (indicate cue type and reason): able to complete figure four position to donn socks/underwear Toilet Transfer: Cueing for safety, Minimal assistance Toileting- Clothing Manipulation and Hygiene: Minimal assistance, Cueing for safety, Sit to/from stand Functional mobility during ADLs: Minimal assistance, Cueing for safety, Rolling walker, Cueing for sequencing General ADL Comments: Cues to open eyes for ADL  Cognition: Cognition Overall Cognitive Status: Impaired/Different from baseline Orientation  Level: Oriented X4 Cognition Arousal/Alertness: Awake/alert Behavior During Therapy: Flat affect Overall Cognitive Status: Impaired/Different from baseline Area of Impairment: Memory, Following commands, Safety/judgement, Awareness, Problem solving Current Attention Level: Selective Memory: Decreased recall of precautions Following Commands: Follows one step commands consistently Safety/Judgement: Decreased awareness of safety, Decreased awareness of deficits Awareness: Emergent Problem Solving: Slow processing, Requires verbal cues, Difficulty sequencing General Comments: decreased self awareness  Physical Exam: Blood pressure (!) 145/98, pulse 91, temperature 98.5 F (36.9 C), temperature source Oral, resp. rate 18, height 6\' 3"  (1.905 m), weight (!) 147.4 kg, SpO2 98 %. Physical Exam Vitals reviewed.  Constitutional:      General: He is not in acute distress.    Appearance: He is well-developed. He is obese.  HENT:     Head: Normocephalic.     Comments: Craniotomy site clean and dry Left external ear normal Right external ear normal Eyes:     Extraocular Movements: Extraocular movements intact.     Comments: No discharge from eyes  Pulmonary:     Effort: Pulmonary effort is normal. No tachypnea.     Breath sounds: No stridor.  Abdominal:     Palpations: Abdomen is soft.     Comments: Nondistended  Musculoskeletal:     Cervical back: Normal range of motion and neck supple.     Comments: No edema or tenderness in extremities  Skin:    General: Skin is warm and dry.     Comments: Craney site C/D/I  Neurological:     Mental Status: He is alert.     Comments: Lethargic Makes good eye contact with examiner.   Oriented x3 and follows commands. Motor: RUE/LE: 4+-5/5 proximal distal RUE/RLE: 4+/5 proximal to distal  Psychiatric:        Mood and Affect: Affect is blunt and flat.        Speech: Speech is delayed.     Results for orders placed or performed during the  hospital encounter of 11/16/19 (from the past 48 hour(s))  Basic metabolic panel     Status: Abnormal   Collection Time: 11/21/19  5:39 AM  Result Value Ref Range   Sodium 139 135 - 145 mmol/L   Potassium 3.7 3.5 - 5.1 mmol/L   Chloride 103 98 - 111 mmol/L   CO2 26 22 - 32 mmol/L   Glucose, Bld 97 70 - 99 mg/dL    Comment: Glucose reference range applies only to samples taken after fasting for at least 8 hours.   BUN 10 6 - 20 mg/dL   Creatinine, Ser 01/22/20 0.61 - 1.24 mg/dL   Calcium 8.7 (L) 8.9 - 10.3 mg/dL   GFR calc non Af Amer >60 >60 mL/min   GFR calc Af Amer >60 >60 mL/min   Anion gap 10 5 - 15    Comment: Performed at [facility], [address], [place], [place]  Protime-INR     Status: None   Collection Time: 11/21/19  5:39 AM  Result Value Ref Range   Prothrombin Time 13.7 11.4 - 15.2 seconds   INR 1.1 0.8 - 1.2    Comment: (NOTE) INR goal varies based on device and disease states. Performed at [facility], [address], [place], [place]    ECHO TEE  Result Date: 11/21/2019    TRANSESOPHOGEAL ECHO REPORT   Patient Name:   [name] Date of Exam: 11/21/2019  Medical Rec #:  [id number]      Height:       75.0 in Accession #:    [id number]     Weight:       325.0 lb Date of Birth:  1970/09/22      BSA:          2.697 m Patient Age:    48 years       BP:           157/106 mmHg Patient Gender: M              HR:           73 bpm. Exam Location:  Inpatient Procedure: Transesophageal Echo, Cardiac Doppler and Color Doppler Indications:     Stroke  History:         Patient has prior history of Echocardiogram examinations, most                  recent 11/17/2019.  Sonographer:     Margreta Journeyichard Lombardo Referring Phys:  [id number] LAURA R INGOLD Diagnosing Phys: Jodelle RedBridgette Christopher MD PROCEDURE: After discussion of the risks and benefits of a TEE, an informed consent was obtained from the patient. The transesophogeal probe was passed without difficulty through the  esophogus of the patient. Local oropharyngeal anesthetic was provided with Cetacaine. Sedation performed by different physician. Image quality was good. The patient developed no complications during the procedure. IMPRESSIONS  1. Left ventricular ejection fraction, by estimation, is 60 to 65%. The left ventricle has normal function. The left ventricle has no regional wall motion abnormalities.  2. Right ventricular systolic function is normal. The right ventricular size is normal.  3. No left atrial/left atrial appendage thrombus was detected.  4. The mitral valve is normal in structure. Trivial mitral valve regurgitation. No evidence of mitral stenosis.  5. The aortic valve is tricuspid. Aortic valve regurgitation is trivial. No aortic stenosis is present.  6. There is mild (Grade II) plaque involving the descending aorta.  7. No shunt seen by color flow doppler. Agitated saline contrast bubble study was positive with shunting observed within 3-6 cardiac cycles suggestive of interatrial shunt. Conclusion(s)/Recommendation(s): Study positive for intra-atrial flow communication (positive bubble study). Highly mobile intra-atrial septum, but no clear PFO or ASD color flow seen despite multiple angles/windows. Recommend consideration of additional  outpatient imaging (cardiac MRI vs. Cardiac CT) to further evaluation anatomy prior to consideration of transcatheter closure. FINDINGS  Left Ventricle: Left ventricular ejection fraction, by estimation, is 60 to 65%. The left ventricle has normal function. The left ventricle has no regional wall motion abnormalities. The left ventricular internal cavity size was normal in size. There is  no left ventricular hypertrophy. Right Ventricle: The right ventricular size is normal. No increase in right ventricular wall thickness. Right ventricular systolic function is normal. Left Atrium: Left atrial size was not well visualized. No left atrial/left atrial appendage thrombus was  detected. Right Atrium: Right atrial size was not well visualized. Pericardium: There is no evidence of pericardial effusion. Mitral Valve: The mitral valve is normal in structure. Trivial mitral valve regurgitation. No evidence of mitral valve stenosis. There is no evidence of mitral valve vegetation. Tricuspid Valve: The tricuspid valve is normal in structure. Tricuspid valve regurgitation is trivial. No evidence of tricuspid stenosis. There is no evidence of tricuspid valve vegetation. Aortic Valve: Trivial regurgitation seen at Aortic aspect of RCC-NCC commissure. The aortic valve is tricuspid. Aortic valve regurgitation is trivial. No aortic  stenosis is present. There is no evidence of aortic valve vegetation. Pulmonic Valve: The pulmonic valve was normal in structure. Pulmonic valve regurgitation is trivial. There is no evidence of pulmonic valve vegetation. Aorta: The aortic root is normal in size and structure. There is mild (Grade II) plaque involving the descending aorta. IAS/Shunts: The interatrial septum is aneurysmal. No shunt seen by color flow doppler. Agitated saline contrast was given intravenously to evaluate for intracardiac shunting. Agitated saline contrast bubble study was positive with shunting observed within 3-6 cardiac cycles suggestive of interatrial shunt. [name] MD Electronically signed by [name] MD Signature Date/Time: 11/21/2019/4:36:07 PM    Final        Medical Problem List and Plan: 1.  Decreased functional mobility with right side weakness secondary to left PICA territory infarction status post craniotomy 11/17/2019 for brainstem compression and obstructive hydrocephalus  -patient maynot shower  -ELOS/Goals: 12-15 days/supervision  Admit to CIR 2.  Antithrombotics: -DVT/anticoagulation: Lovenox  -antiplatelet therapy: Aspirin 81 mg daily and Plavix 75 mg daily x3 weeks then aspirin alone 3. Pain Management: Oxycodone as needed, tramadol as  needed, Fioricet as needed  Monitor headaches with increased exertion 4. Mood: Lexapro 10 mg daily Klonopin 0.5 mg as needed sleep  -antipsychotic agents: N/A 5. Neuropsych: This patient is?  Fully capable of making decisions on his own behalf. 6. Skin/Wound Care: Routine skin checks 7. Fluids/Electrolytes/Nutrition: Routine in and outs.  CMP ordered 8.  PFO.  Follow-up outpatient Dr. Excell Seltzer for consideration of closure 9.  Hypertension.  Norvasc 5 mg daily, Cozaar 50 mg daily.    Monitor increase mobility 10.  Hyperlipidemia.  Lipitor 11.  Persistent headaches-likely vascular.  Continue Depakote  See #3 12.  Sleep apnea.  Continue CPAP  Mcarthur Rossetti Angiulli, [name] 11/22/2019  I have personally performed a face to face diagnostic evaluation, including, but not limited to relevant history and physical exam findings, of this patient and developed relevant assessment and plan.  Additionally, I have reviewed and concur with the physician assistant's documentation above.  Maryla Morrow, MD, ABPMR

## 2019-11-22 NOTE — Progress Notes (Signed)
Patient complaining of left upper chest tightness. Says it feels like the same pain as two days before his admission. Says the pain comes and goes for five minutes at a time. Pain does not increase with touch. Annie Main, NP notified. Will continue to monitor. [name]

## 2019-11-22 NOTE — Progress Notes (Signed)
Neurosurgery Service Progress Note  Subjective: No acute events overnight, pt using the restroom this morning on rounds, family says he continues to do well, just some incisional pain, no HA/N/V  Objective: Vitals:   11/22/19 0700 11/22/19 0715 11/22/19 0757 11/22/19 0800  BP: (!) 164/103 (!) 160/107  (!) 136/109  Pulse: 65 87  85  Resp: 15 17  14   Temp:   98.2 F (36.8 C)   TempSrc:   Oral   SpO2: 99% 98%  98%  Weight:      Height:       Temp (24hrs), Avg:98.5 F (36.9 C), Min:98 F (36.7 C), Max:99.3 F (37.4 C)  CBC Latest Ref Rng & Units 11/20/2019 11/19/2019 11/18/2019  WBC 4.0 - 10.5 K/uL 9.4 11.4(H) 11.1(H)  Hemoglobin 13.0 - 17.0 g/dL 01/19/2020 11.9 41.7  Hematocrit 39 - 52 % 42.2 42.7 42.2  Platelets 150 - 400 K/uL 194 203 200   BMP Latest Ref Rng & Units 11/21/2019 11/20/2019 11/19/2019  Glucose 70 - 99 mg/dL 97 01/20/2020) 144(Y)  BUN 6 - 20 mg/dL 10 8 8   Creatinine 0.61 - 1.24 mg/dL 185(U 3.14  Sodium 135 - 145 mmol/L 139 138 134(L)  Potassium 3.5 - 5.1 mmol/L 3.7 3.1(L) 3.4(L)  Chloride 98 - 111 mmol/L 103 104 100  CO2 22 - 32 mmol/L 26 23 25   Calcium 8.9 - 10.3 mg/dL 9.70) 2.63) )    Intake/Output Summary (Last 24 hours) at 11/22/2019 1025 Last data filed at 11/22/2019 0800 Gross per 24 hour  Intake 1179.87 ml  Output --  Net 1179.87 ml    Current Facility-Administered Medications:     stroke: mapping our early stages of recovery book, , Does not apply, Once, 0.2(D, MD   0.9 %  sodium chloride infusion, , Intravenous, Continuous, 01/23/2020 R, NP   0.9 % NaCl with KCl 20 mEq/ L  infusion, , Intravenous, Continuous, 01/23/2020, MD, Last Rate: 75 mL/hr at 11/22/19 0800, Rate Verify at 11/22/19 0800   acetaminophen (TYLENOL) tablet 650 mg, 650 mg, Oral, Q6H PRN, 650 mg at 11/21/19 1303 **OR** acetaminophen (TYLENOL) suppository 650 mg, 650 mg, Rectal, Q6H PRN, 01/23/20, MD   amLODipine (NORVASC) tablet 5 mg, 5 mg, Oral,  Daily, Biby, Sharon L, NP, 5 mg at 11/22/19 01/22/20   aspirin EC tablet 81 mg, 81 mg, Oral, Daily, [name], MD, 81 mg at 11/22/19 7412   atorvastatin (LIPITOR) tablet 80 mg, 80 mg, Oral, Daily, [name], MD, 80 mg at 11/22/19 8786   bisacodyl (DULCOLAX) suppository 10 mg, 10 mg, Rectal, Daily PRN, [name], MD   butalbital-acetaminophen-caffeine (FIORICET) 50-325-40 MG per tablet 2 tablet, 2 tablet, Oral, Q6H PRN, 01/23/20, MD, 2 tablet at 11/22/19 0410   Chlorhexidine Gluconate Cloth 2 % PADS 6 each, 6 each, Topical, Daily, [name], MD   clonazePAM [name]) tablet 0.5 mg, 0.5 mg, Oral, PRN, 01/23/20, MD, 0.5 mg at 11/21/19 2247   [COMPLETED] valproate (DEPACON) 500 mg in dextrose 5 % 50 mL IVPB, 500 mg, Intravenous, Once, Last Rate: 55 mL/hr at 11/21/19 1100, Rate Verify at 11/21/19 1100 **FOLLOWED BY** divalproex (DEPAKOTE ER) 24 hr tablet 500 mg, 500 mg, Oral, Daily, 2248, MD, 500 mg at 11/22/19 01/22/20   docusate sodium (COLACE) capsule 100 mg, 100 mg, Oral, BID, [name], MD, 100 mg at 11/21/19 1303   enoxaparin (LOVENOX) injection 40 mg, 40 mg, Subcutaneous, Daily, 0947, MD, 40 mg at  11/22/19 0929   escitalopram (LEXAPRO) tablet 10 mg, 10 mg, Oral, Daily, [name], MD, 10 mg at 11/21/19 1302   labetalol (NORMODYNE) injection 10 mg, 10 mg, Intravenous, Q4H PRN, [name], MD, 10 mg at 11/20/19 1842   losartan (COZAAR) tablet 50 mg, 50 mg, Oral, Daily, [name], MD, 50 mg at 11/22/19 4825   meclizine (ANTIVERT) tablet 25 mg, 25 mg, Oral, TID PRN, [name], MD, 25 mg at 11/19/19 1658   MEDLINE mouth rinse, 15 mL, Mouth Rinse, BID, [name], MD, 15 mL at 11/21/19 1000   ondansetron (ZOFRAN) tablet 4 mg, 4 mg, Oral, Q6H PRN **OR** ondansetron (ZOFRAN) injection 4 mg, 4 mg, Intravenous, Q6H PRN, [name], MD, 4 mg at 11/18/19 2220   oxyCODONE (Oxy IR/ROXICODONE) immediate release tablet 5 mg, 5 mg, Oral, Q4H PRN, [name], MD, 5 mg at 11/22/19 0410   promethazine (PHENERGAN) injection 25 mg, 25 mg, Intravenous, Q6H PRN, [name], MD   traMADol Janean Sark) tablet 50 mg, 50 mg, Oral, Q6H PRN, [name], MD, 50 mg at 11/20/19 0030   Assessment & Plan: 49 y.o. man s/p suboccipital craniectomy for PICA stroke w/ hydrocephalus, recovering well. Post-op CTH with decompression, improved ventricular size.  -will sign off, call with any concerns or questions, pt should follow up with me in 2 weeks if discharged, otherwise if in CIR I can come by and do his 2 week visit while he's at CIR -okay with anticoagulants / anti-platelet agents as needed from a neurosurgical standpoint  [name]  11/22/19 10:25 AM

## 2019-11-22 NOTE — Progress Notes (Signed)
Inpatient Rehab Admissions Coordinator:    I have a bed available for pt to admit to CIR today.  Annie Main, NP, in agreement.  I will let pt/family, and TOC team know.   Estill Dooms, PT, DPT Admissions Coordinator [phone] 11/22/19  3:06 PM

## 2019-11-23 ENCOUNTER — Inpatient Hospital Stay (HOSPITAL_COMMUNITY): Payer: 59 | Admitting: Occupational Therapy

## 2019-11-23 ENCOUNTER — Inpatient Hospital Stay (HOSPITAL_COMMUNITY): Payer: 59 | Admitting: Physical Therapy

## 2019-11-23 ENCOUNTER — Encounter (HOSPITAL_COMMUNITY): Payer: Self-pay | Admitting: Cardiology

## 2019-11-23 DIAGNOSIS — I639 Cerebral infarction, unspecified: Secondary | ICD-10-CM

## 2019-11-23 LAB — COMPREHENSIVE METABOLIC PANEL
ALT: 52 U/L — ABNORMAL HIGH (ref 0–44)
AST: 44 U/L — ABNORMAL HIGH (ref 15–41)
Albumin: 3.2 g/dL — ABNORMAL LOW (ref 3.5–5.0)
Alkaline Phosphatase: 71 U/L (ref 38–126)
Anion gap: 11 (ref 5–15)
BUN: 10 mg/dL (ref 6–20)
CO2: 24 mmol/L (ref 22–32)
Calcium: 9 mg/dL (ref 8.9–10.3)
Chloride: 103 mmol/L (ref 98–111)
Creatinine, Ser: 0.77 mg/dL (ref 0.61–1.24)
GFR calc Af Amer: >60 mL/min (ref >60)
GFR calc non Af Amer: >60 mL/min (ref >60)
Glucose, Bld: 99 mg/dL (ref 70–99)
Potassium: 3.5 mmol/L (ref 3.5–5.1)
Sodium: 138 mmol/L (ref 135–145)
Total Bilirubin: 0.4 mg/dL (ref 0.3–1.2)
Total Protein: 6.9 g/dL (ref 6.5–8.1)

## 2019-11-23 LAB — CBC WITH DIFFERENTIAL/PLATELET
Abs Immature Granulocytes: 0.02 10*3/uL (ref 0.00–0.07)
Basophils Absolute: 0 10*3/uL (ref 0.0–0.1)
Basophils Relative: 0 %
Eosinophils Absolute: 0.1 10*3/uL (ref 0.0–0.5)
Eosinophils Relative: 1 %
HCT: 41.9 % (ref 39.0–52.0)
Hemoglobin: 14.6 g/dL (ref 13.0–17.0)
Immature Granulocytes: 0 %
Lymphocytes Relative: 19 %
Lymphs Abs: 1.4 10*3/uL (ref 0.7–4.0)
MCH: 32 pg (ref 26.0–34.0)
MCHC: 34.8 g/dL (ref 30.0–36.0)
MCV: 91.9 fL (ref 80.0–100.0)
Monocytes Absolute: 0.9 10*3/uL (ref 0.1–1.0)
Monocytes Relative: 13 %
Neutro Abs: 4.7 10*3/uL (ref 1.7–7.7)
Neutrophils Relative %: 67 %
Platelets: 227 10*3/uL (ref 150–400)
RBC: 4.56 MIL/uL (ref 4.22–5.81)
RDW: 11.9 % (ref 11.5–15.5)
WBC: 7.1 10*3/uL (ref 4.0–10.5)
nRBC: 0 % (ref 0.0–0.2)

## 2019-11-23 LAB — URINE CULTURE: Culture: <10000 — AB

## 2019-11-23 LAB — HOMOCYSTEINE: Homocysteine: 6.6 umol/L (ref 0.0–14.5)

## 2019-11-23 MED ORDER — EXERCISE FOR HEART AND HEALTH BOOK
Freq: Once | Status: AC
  Filled 2019-11-23: qty 1

## 2019-11-23 MED ORDER — BLOOD PRESSURE CONTROL BOOK
Freq: Once | Status: AC
  Filled 2019-11-23: qty 1

## 2019-11-23 MED ORDER — ACETAMINOPHEN 325 MG PO TABS
650.0000 mg | ORAL_TABLET | Freq: Four times a day (QID) | ORAL | Status: DC | PRN
  Administered 2019-11-23 – 2019-12-04 (×9): 650 mg via ORAL
  Filled 2019-11-23 (×10): qty 2

## 2019-11-23 NOTE — Evaluation (Signed)
Physical Therapy Assessment and Plan  Patient Details  Name: [name] MRN: [id number] Date of Birth: 21-Mar-1971  PT Diagnosis: Abnormality of gait, Ataxia, Ataxic gait, Cognitive deficits, Difficulty walking, Impaired cognition, Muscle weakness and Pain in headache/cervical spine surgery location Rehab Potential: Good ELOS: ~ 2 weeks   Today's Date: 11/23/2019 PT Individual Time: 0850-0952 PT Individual Time Calculation (min): 62 min    Hospital Problem: Principal Problem:   Ischemic stroke ([place]) - L PICA infarct embolic in setting of PFO s/p crani Active Problems:   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 ([place])   Past Medical History:  Past Medical History:  Diagnosis Date  . Arthritis    neck  . Carpal tunnel syndrome on both sides 08/2014  . DDD (degenerative disc disease), lumbar   . History of kidney stones   . Hypertension    under control with med., has been on med. x 1 yr.  . Sleep apnea    CPAP but does not use   Past Surgical History:  Past Surgical History:  Procedure Laterality Date  . CARPAL TUNNEL RELEASE Left 09/07/2014   Procedure: LEFT CARPAL TUNNEL RELEASE;  Surgeon: Roseanne Kaufman, MD;  Location: [facility];  Service: Orthopedics;  Laterality: Left;  . CARPAL TUNNEL RELEASE Right 10/12/2014   Procedure: RIGHT CARPAL TUNNEL RELEASE;  Surgeon: Roseanne Kaufman, MD;  Location: [facility];  Service: Orthopedics;  Laterality: Right;  . KNEE ARTHROSCOPY Left   . SUBOCCIPITAL CRANIECTOMY CERVICAL LAMINECTOMY N/A 11/17/2019   Procedure: SUBOCCIPITAL DECOMPRESSIVE CRANIECTOMY;  Surgeon: Judith Part, MD;  Location: [facility];  Service: Neurosurgery;  Laterality: N/A;    Assessment & Plan Clinical Impression: Patient is a 49 y.o. year old right-handed male history of hypertension, sleep apnea with intermittent CPAP, hyperlipidemia, depression with anxiety, obesity with BMI 39.68.  History taken from chart review,  family, and patient due to lethargy mentation.  Patient lives with spouse.  He works as a Building control surveyor.  1 level home 3 steps to entry.  He presented on 11/16/2019 with dizziness and gait abnormality.  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  Additional punctate acute infarct question within the left occipital lobe.  CT angiogram of head and neck negative for large vessel occlusion or stenosis.  Echocardiogram with ejection fraction of 65%, normal wall abnormalities.  TCD Doppler with bubble positive for PFO at rest.  TEE highly mobile intra-atrial septum.  But no clear PFO or ASD flow seen despite multiple angle windows.  Admission chemistries potassium 3.3, glucose 189 troponin negative alcohol negative lactic acid 5.2.  Hospital course further complicated by increasing headaches and lethargy.  Follow-up CT imaging showed brainstem compression and obstructive hydrocephalus with patient undergoing suboccipital craniotomy on 11/17/2019 per Dr. Venetia Constable.  Neurology follow-up and patient was cleared to begin aspirin and Plavix for CVA prophylaxis x3 weeks and aspirin alone.  Patient would follow-up Dr. Burt Knack to consider PFO closure as an outpatient.  Patient was cleared to begin subcutaneous Lovenox for DVT prophylaxis.  Maintained on Depakote for persistent headaches.  Close monitoring of blood pressure initially on Cleviprex.  Tolerating a regular diet.  Hospital course further complicated by leukocytosis. Patient was admitted for a comprehensive rehab program. Patient transferred to CIR on 11/22/2019 .   Patient currently requires mod assist with mobility secondary to muscle weakness, decreased cardiorespiratoy endurance, impaired timing and sequencing, unbalanced muscle activation, ataxia and decreased coordination,  ,  decreased problem solving and decreased safety awareness and decreased sitting balance, decreased standing balance, decreased postural control  and decreased balance strategies.  Prior to hospitalization, patient was independent  with mobility and lived with Spouse in a House home.  Home access is 3Stairs to enter.  Patient will benefit from skilled PT intervention to maximize safe functional mobility, minimize fall risk and decrease caregiver burden for planned discharge home with 24 hour supervision.  Anticipate patient will benefit from follow up OP at discharge.  PT - End of Session Activity Tolerance: Tolerates 30+ min activity with multiple rests Endurance Deficit: Yes Endurance Deficit Description: requires seated rest breaks PT Assessment Rehab Potential (ACUTE/IP ONLY): Good PT Barriers to Discharge: Inaccessible home environment;Behavior;Weight;Home environment access/layout PT Patient demonstrates impairments in the following area(s): Balance;Motor;Safety;Behavior;Nutrition;Sensory;Edema;Pain;Skin Integrity;Endurance;Perception PT Transfers Functional Problem(s): Bed Mobility;Bed to Chair;Car;Furniture PT Locomotion Functional Problem(s): Ambulation;Stairs PT Plan PT Intensity: Minimum of 1-2 x/day ,45 to 90 minutes PT Frequency: 5 out of 7 days PT Duration Estimated Length of Stay: ~ 2 weeks PT Treatment/Interventions: Ambulation/gait training;Balance/vestibular training;Cognitive remediation/compensation;Community reintegration;Discharge planning;Disease management/prevention;DME/adaptive equipment instruction;Functional electrical stimulation;Functional mobility training;Neuromuscular re-education;Pain management;Patient/family education;Psychosocial support;Skin care/wound management;Splinting/orthotics;Stair training;Therapeutic Activities;Therapeutic Exercise;UE/LE Strength taining/ROM;UE/LE Coordination activities;Visual/perceptual remediation/compensation PT Transfers Anticipated Outcome(s): supervision PT Locomotion Anticipated Outcome(s): supervision using LRAD PT Recommendation Recommendations for Other Services:  Neuropsych consult;Therapeutic Recreation consult Therapeutic Recreation Interventions: Stress management;Outing/community reintergration Follow Up Recommendations: Outpatient PT;24 hour supervision/assistance Patient destination: Home Equipment Recommended: To be determined  Skilled Therapeutic Intervention Evaluation completed (see details above and below) with education on PT POC and goals and individual treatment initiated with focus on activity tolerance, bed mobility, transfers, gait training, and pt education regarding daily therapy schedule, weekly team meetings, purpose of PT evaluation, and other CIR information. Pt received asleep, R sidelying in bed and upon awakening agreeable to therapy session. Pt reports he has some "anxiety" upon therapist entering - provided emotional support and education on purpose of PT evaluation. Pt reports need to use bathroom. R sidelying>supine>sitting L EOB with supervision, pt requesting to use bed features due to urgency.  Sit>stand elevated EOB>RW (pt requests to elevate EOB) min assist for balance - demonstrates truncal ataxia with increased postural sway in standing. Gait ~12f to bathroom using RW with min assist for balance - slow gait speed with pt frequently stopping to recover his balance. Sit<>stand to/from toilet using grab bar and RW for support with min assist for balance. Continent of bladder. Gait ~84fto wheelchair using RW with min assist for balance. Pt frequently talks about how his dad was in CIR here 2 years ago from a stroke and this causes pt to become emotional - therapist provided emotional support during session.  Transported to/from gym in w/c for time management and energy conservation. Gait training ~907fith B HHA - mod assist of 1 person for balance and +2 assist for UE support and steadying - demonstrates wide BOS, very slow gait speed with pt continuing to stop frequently to regain balance. Stairs unavailable despite checking 2x  during session. Simulated car transfer (SUV height) via stand pivot to/from w/c, no AD, with min assist for balance and pt relying on UE support on bars of car. Transported back to room and pt requesting to sit in recliner. Stand pivot w/c>recliner using RW with min assist for balance. Pt left seated in recliner with needs in reach, B LEs elevated, and seat belt alarm on.  PT Evaluation Precautions/Restrictions Precautions Precautions: Fall Precaution Comments: sensitive to light Restrictions Weight  Bearing Restrictions: No Pain  Pain Assessment Pain Scale: 0-10 (upon questioning of pain pt responds" they just gave me something a little bit ago" and no complaints of pain during session) Pain Intervention(s): Medication (See eMAR) Home Living/Prior Functioning Home Living Available Help at Discharge: Family;Available 24 hours/day Type of Home: House Home Access: Stairs to enter [employer] of Steps: 3 Entrance Stairs-Rails: Right;Left ("like a deck platform") Home Layout: One level  Lives With: Spouse (wife, [name]) Prior Function Level of Independence: Independent with gait;Independent with homemaking with ambulation;Independent with transfers  Able to Take Stairs?: Yes Driving: Yes Vocation: Full time employment Leisure: Hobbies-yes (Comment) Comments: works as a Building control surveyor; enjoys classic Lawyer: Within [place]: Intact  Cognition Overall Cognitive Status: Impaired/Different from baseline Arousal/Alertness: Lethargic (increased alertness with mobility) Orientation Level: Oriented X4 Attention: Focused Focused Attention: Appears intact Memory: Appears intact Awareness: Impaired Safety/Judgment: Impaired Sensation  Sensation Light Touch: Appears Intact Hot/Cold: Not tested Proprioception: Impaired by gross assessment (stands with wide BOS - difficult to determine if this is due to compensation from impaired  balance or poor proprioceptive awareness) Additional Comments: pt reports now it feels like his "feet get cold" but light touch intact during screen Coordination Gross Motor Movements are Fluid and Coordinated: No Coordination and Movement Description: impaired coordination with some truncal ataxia, decreased gait speed with wide BOS Heel Shin Test: WFLs Motor  Motor Motor: Ataxia Motor - Skilled Clinical Observations: mild truncal ataxia in standing  Mobility Bed Mobility Bed Mobility: Supine to Sit;Sit to Supine Supine to Sit: Contact Guard/Touching assist Sit to Supine: Contact Guard/Touching assist Transfers Transfers: Sit to Stand;Stand to Sit;Stand Pivot Transfers Sit to Stand: Minimal Assistance - Patient > 75% Stand to Sit: Minimal Assistance - Patient > 75% Stand Pivot Transfers: Moderate Assistance - Patient 50 - 74% (or min assist using RW) Transfer (Assistive device): None Locomotion  Gait Ambulation: Yes Gait Assistance: 2 Helpers Gait Distance (Feet): 90 Feet Assistive device: 2 person hand held assist Gait Assistance Details: Tactile cues for sequencing;Tactile cues for weight shifting;Tactile cues for posture;Verbal cues for technique;Verbal cues for precautions/safety;Verbal cues for gait pattern Gait Gait: Yes Gait Pattern: Impaired Gait Pattern: Decreased step length - right;Decreased step length - left;Wide base of support Gait velocity: decreased with pt frequently stopping to regain balance Stairs / Additional Locomotion Stairs: No Wheelchair Mobility Wheelchair Mobility: No  Trunk/Postural Assessment  Cervical Assessment Cervical Assessment: Within Functional Limits Thoracic Assessment Thoracic Assessment: Within Functional Limits Lumbar Assessment Lumbar Assessment: Within Functional Limits Postural Control Postural Control: Deficits on evaluation Righting Reactions: delayed and insufficient Postural Limitations: decreased   Balance Balance Balance Assessed: Yes Static Sitting Balance Static Sitting - Balance Support: Feet supported Static Sitting - Level of Assistance: 5: Stand by assistance Dynamic Sitting Balance Dynamic Sitting - Balance Support: During functional activity Dynamic Sitting - Level of Assistance: 5: Stand by assistance Static Standing Balance Static Standing - Balance Support: During functional activity;No upper extremity supported Static Standing - Level of Assistance: 4: Min assist Dynamic Standing Balance Dynamic Standing - Balance Support: During functional activity;No upper extremity supported Dynamic Standing - Level of Assistance: 3: Mod assist;2: Max assist Extremity Assessment      RLE Assessment RLE Assessment: Exceptions to [facility] Passive Range of Motion (PROM) Comments: [facility] General Strength Comments: Grossly 4/5 demonstrated during functional mobility - except pt reports hx of R foot drop from lumbar spine involvement (has never worn a brace) LLE Assessment LLE Assessment:  Exceptions to [facility] Passive Range of Motion (PROM) Comments: [facility] General Strength Comments: Grossly 4/5 demonstrated during functional mobility    Refer to Care Plan for Long Term Goals  Recommendations for other services: Neuropsych and Therapeutic Recreation  Stress management and Outing/community reintegration  Discharge Criteria: Patient will be discharged from PT if patient refuses treatment 3 consecutive times without medical reason, if treatment goals not met, if there is a change in medical status, if patient makes no progress towards goals or if patient is discharged from hospital.  The above assessment, treatment plan, treatment alternatives and goals were discussed and mutually agreed upon: by patient  Tawana Scale, PT, DPT, CSRS  11/23/2019, 7:50 AM

## 2019-11-23 NOTE — Progress Notes (Signed)
Inpatient Rehabilitation Care Coordinator Assessment and Plan  Patient Details  Name: [name] MRN: [id number] Date of Birth: 08-25-1970  Today's Date: 11/23/2019  Problem List:  Patient Active Problem List   Diagnosis Date Noted  . PFO (patent foramen ovale) 11/22/2019  . Headache 11/22/2019  . Cerebral edema ([facility]) 11/22/2019  . Chest pain 11/22/2019  . Hyperlipidemia LDL goal <70 11/22/2019  . OSA (obstructive sleep apnea) 11/22/2019  . 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 Vascular headache   . Dyslipidemia   . Hypertension   . Intractable nausea and vomiting   . Hypokalemia   . Depression with anxiety   . Ischemic stroke ([facility]) - L PICA infarct embolic in setting of PFO s/p crani   . Lumbar spondylolysis 07/31/2019  . Lumbar radiculopathy 04/07/2019  . Right foot drop 04/07/2019  . Backache 07/05/2017  . DDD (degenerative disc disease), lumbar 07/05/2017  . Spinal stenosis of lumbar region 07/05/2017   Past Medical History:  Past Medical History:  Diagnosis Date  . Arthritis    neck  . Carpal tunnel syndrome on both sides 08/2014  . DDD (degenerative disc disease), lumbar   . History of kidney stones   . Hypertension    under control with med., has been on med. x 1 yr.  . Sleep apnea    CPAP but does not use   Past Surgical History:  Past Surgical History:  Procedure Laterality Date  . CARPAL TUNNEL RELEASE Left 09/07/2014   Procedure: LEFT CARPAL TUNNEL RELEASE;  Surgeon: [name], MD;  Location: [facility];  Service: Orthopedics;  Laterality: Left;  . CARPAL TUNNEL RELEASE Right 10/12/2014   Procedure: RIGHT CARPAL TUNNEL RELEASE;  Surgeon: [name], MD;  Location: [facility];  Service: Orthopedics;  Laterality: Right;  . KNEE ARTHROSCOPY Left   . SUBOCCIPITAL CRANIECTOMY CERVICAL LAMINECTOMY N/A 11/17/2019   Procedure: SUBOCCIPITAL DECOMPRESSIVE CRANIECTOMY;  Surgeon:  [name], MD;  Location[facility] OR;  Service: Neurosurgery;  Laterality: N/A;   Social History:  reports that he has never smoked. He has never used smokeless tobacco. He reports previous alcohol use. He reports that he does not use drugs.  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 Education: Trade School Read: Yes Write: Yes Employment Status: Employed   Abuse/Neglect Abuse/Neglect Assessment Can Be Completed: Yes Physical Abuse: Denies Verbal Abuse: Denies Sexual Abuse: Denies Exploitation of patient/patient's resources: Denies Self-Neglect: Denies  Emotional Status Pt's affect, behavior and adjustment status: Patient reported feeing more down and having a hard time accepting whats going on Recent Psychosocial Issues: no Psychiatric History: no Substance Abuse History: no  Patient / Family Perceptions, Expectations & Goals Pt/Family understanding of illness & functional limitations: yes 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 Would like to put health first Pt/family expectations/goals: Goal to discharge back home with family.  Community Resources [employer]: None Premorbid Home Care/DME Agencies: None Transportation available at discharge: spouse able to transport Resource referrals recommended: Neuropsychology  Discharge Planning Living Arrangements: Spouse/significant other Support Systems: Spouse/significant other Type of Residence: Private residence (1 Level home. 2 steps to enter back step,  railings) Insurance Resources:  Media planner (specify) Education officer, museum) Financial Resources: Employment Financial Screen Referred: No Living Expenses: Mortgage Does the patient have any problems obtaining your medications?: No Care Coordinator Anticipated Follow Up Needs: HH/OP Expected length of stay: 10-12 Days  Clinical Impression Sw entered room introduced self, explained role and addressed questions and concerns. Spouse dropped patient off breakfast and plans to return this evening. Patient very flat and reports having a hard time adjusting to what has happened. SW referred to neuropsych. Sw will continue to follow up with patient.    Andria Rhein 11/23/2019, 10:20 AM

## 2019-11-23 NOTE — Progress Notes (Signed)
Inpatient Rehabilitation Center Individual Statement of Services  Patient Name:  [name]  Date:  11/23/2019  Welcome to the Inpatient Rehabilitation Center.  Our goal is to provide you with an individualized program based on your diagnosis and situation, designed to meet your specific needs.  With this comprehensive rehabilitation program, you will be expected to participate in at least 3 hours of rehabilitation therapies Monday-Friday, with modified therapy programming on the weekends.  Your rehabilitation program will include the following services:  Physical Therapy (PT), Occupational Therapy (OT), Speech Therapy (ST), 24 hour per day rehabilitation nursing, Therapeutic Recreaction (TR), Neuropsychology, Care Coordinator, Rehabilitation Medicine, Nutrition Services, Pharmacy Services and Other  Weekly team conferences will be held on Wednesday to discuss your progress.  Your Inpatient Rehabilitation Care Coordinator will talk with you frequently to get your input and to update you on team discussions.  Team conferences with you and your family in attendance may also be held.  Expected length of stay: 10-14 Days  Overall anticipated outcome: Supervision  Depending on your progress and recovery, your program may change. Your Inpatient Rehabilitation Care Coordinator will coordinate services and will keep you informed of any changes. Your Inpatient Rehabilitation Care Coordinator's name and contact numbers are listed  below.  The following services may also be recommended but are not provided by the Inpatient Rehabilitation Center:    Home Health Rehabiltiation Services  Outpatient Rehabilitation Services   Arrangements will be made to provide these services after discharge if needed.  Arrangements include referral to agencies that provide these services.  Your insurance has been verified to be:  [facility] Your primary doctor is:  Sigmund Hazel, MD  Pertinent information will be shared with  your doctor and your insurance company.  Inpatient Rehabilitation Care Coordinator:  Lavera Guise, [place] [phone] or [phone]  Information discussed with and copy given to patient by: Andria Rhein, 11/23/2019, 9:58 AM

## 2019-11-23 NOTE — Progress Notes (Signed)
Patient ID: [name], male   DOB: [birth year], 49 y.o.   MRN: [id number]   Patient requesting to see neuropsych, patient has been added to schedule

## 2019-11-23 NOTE — Progress Notes (Signed)
[facility] PHYSICAL MEDICINE & REHABILITATION PROGRESS NOTE   Subjective/Complaints:  Elevated temp last noc, did not have a cough, no burning with urination , resolved after tylenol   ROS- neg CP, SOB, N/V/D Objective:   DG CHEST PORT 1 VIEW  Result Date: 11/22/2019 CLINICAL DATA:  Fevers EXAM: PORTABLE CHEST 1 VIEW COMPARISON:  11/16/2019 FINDINGS: Cardiac shadow is enlarged but accentuated by the portable technique. The lungs are clear bilaterally. No focal infiltrate or sizable effusion is noted. No bony abnormality is noted. IMPRESSION: No acute abnormality noted. Electronically Signed   By: Alcide Clever M.D.   On: 11/22/2019 20:12   ECHO TEE  Result Date: 11/21/2019    TRANSESOPHOGEAL ECHO REPORT   Patient Name:   [name] Date of Exam: 11/21/2019 Medical Rec #:  [id number]      Height:       75.0 in Accession #:    [id number]     Weight:       325.0 lb Date of Birth:  05-25-1970      BSA:          2.697 m Patient Age:    48 years       BP:           157/106 mmHg Patient Gender: M              HR:           73 bpm. Exam Location:  Inpatient Procedure: Transesophageal Echo, Cardiac Doppler and Color Doppler Indications:     Stroke  History:         Patient has prior history of Echocardiogram examinations, most                  recent 11/17/2019.  Sonographer:     Margreta Journey Referring Phys:  [id number] LAURA R INGOLD Diagnosing Phys: [name] MD PROCEDURE: After discussion of the risks and benefits of a TEE, an informed consent was obtained from the patient. The transesophogeal probe was passed without difficulty through the esophogus of the patient. Local oropharyngeal anesthetic was provided with Cetacaine. Sedation performed by different physician. Image quality was good. The patient developed no complications during the procedure. IMPRESSIONS  1. Left ventricular ejection fraction, by estimation, is 60 to 65%. The left ventricle has normal function. The left ventricle has no  regional wall motion abnormalities.  2. Right ventricular systolic function is normal. The right ventricular size is normal.  3. No left atrial/left atrial appendage thrombus was detected.  4. The mitral valve is normal in structure. Trivial mitral valve regurgitation. No evidence of mitral stenosis.  5. The aortic valve is tricuspid. Aortic valve regurgitation is trivial. No aortic stenosis is present.  6. There is mild (Grade II) plaque involving the descending aorta.  7. No shunt seen by color flow doppler. Agitated saline contrast bubble study was positive with shunting observed within 3-6 cardiac cycles suggestive of interatrial shunt. Conclusion(s)/Recommendation(s): Study positive for intra-atrial flow communication (positive bubble study). Highly mobile intra-atrial septum, but no clear PFO or ASD color flow seen despite multiple angles/windows. Recommend consideration of additional  outpatient imaging (cardiac MRI vs. Cardiac CT) to further evaluation anatomy prior to consideration of transcatheter closure. FINDINGS  Left Ventricle: Left ventricular ejection fraction, by estimation, is 60 to 65%. The left ventricle has normal function. The left ventricle has no regional wall motion abnormalities. The left ventricular internal cavity size was normal in size. There is  no left  ventricular hypertrophy. Right Ventricle: The right ventricular size is normal. No increase in right ventricular wall thickness. Right ventricular systolic function is normal. Left Atrium: Left atrial size was not well visualized. No left atrial/left atrial appendage thrombus was detected. Right Atrium: Right atrial size was not well visualized. Pericardium: There is no evidence of pericardial effusion. Mitral Valve: The mitral valve is normal in structure. Trivial mitral valve regurgitation. No evidence of mitral valve stenosis. There is no evidence of mitral valve vegetation. Tricuspid Valve: The tricuspid valve is normal in  structure. Tricuspid valve regurgitation is trivial. No evidence of tricuspid stenosis. There is no evidence of tricuspid valve vegetation. Aortic Valve: Trivial regurgitation seen at Aortic aspect of RCC-NCC commissure. The aortic valve is tricuspid. Aortic valve regurgitation is trivial. No aortic stenosis is present. There is no evidence of aortic valve vegetation. Pulmonic Valve: The pulmonic valve was normal in structure. Pulmonic valve regurgitation is trivial. There is no evidence of pulmonic valve vegetation. Aorta: The aortic root is normal in size and structure. There is mild (Grade II) plaque involving the descending aorta. IAS/Shunts: The interatrial septum is aneurysmal. No shunt seen by color flow doppler. Agitated saline contrast was given intravenously to evaluate for intracardiac shunting. Agitated saline contrast bubble study was positive with shunting observed within 3-6 cardiac cycles suggestive of interatrial shunt. [name] MD Electronically signed by [name] MD Signature Date/Time: 11/21/2019/4:36:07 PM    Final    Recent Labs    11/23/19 0511  WBC 7.1  HGB 14.6  HCT 41.9  PLT 227   Recent Labs    11/21/19 0539 11/23/19 0511  NA 139 138  K 3.7 3.5  CL 103 103  CO2 26 24  GLUCOSE 97 99  BUN 10 10  CREATININE 0.95 0.77  CALCIUM 8.7* 9.0    Intake/Output Summary (Last 24 hours) at 11/23/2019 0804 Last data filed at 11/22/2019 2035 Gross per 24 hour  Intake --  Output 200 ml  Net -200 ml     Physical Exam: Vital Signs Blood pressure (!) 154/106, pulse 70, temperature 98.5 F (36.9 C), temperature source Oral, resp. rate 18, height 6\' 3"  (1.905 m), weight (!) 137 kg, SpO2 100 %.   General: No acute distress Mood and affect are appropriate Heart: Regular rate and rhythm no rubs murmurs or extra sounds Lungs: Clear to auscultation, breathing unlabored, no rales or wheezes Abdomen: Positive bowel sounds, soft nontender to palpation,  nondistended Extremities: No clubbing, cyanosis, or edema Skin: No evidence of breakdown, no evidence of rash- subaoccipital crani incision  Neurologic: Cranial nerves II through XII intact, motor strength is 5/5 in bilateral deltoid, bicep, tricep, grip, hip flexor, knee extensors, ankle dorsiflexor and plantar flexor Sensory exam normal sensation to light touch  in face bilateral upper and lower extremities Cerebellar exam normal finger to nose to finger as well as heel to shin in bilateral upper and lower extremities Musculoskeletal: Full range of motion in all 4 extremities. No joint swelling   Assessment/Plan: 1. 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  Physiatrist is providing close team supervision and 24 hour management of active medical problems listed below.  Physiatrist and rehab team continue to assess barriers to discharge/monitor patient progress toward functional and medical goals  Care Tool:  Bathing  Bathing activity did not occur:  (N/A)  Bathing assist       Upper Body Dressing/Undressing Upper body dressing Upper body dressing/undressing activity did not occur (including orthotics): N/A      Upper body assist      Lower Body Dressing/Undressing Lower body dressing    Lower body dressing activity did not occur: N/A       Lower body assist       Toileting Toileting    Toileting assist Assist for toileting: Minimal Assistance - Patient > 75%     Transfers Chair/bed transfer  Transfers assist           Locomotion Ambulation   Ambulation assist              Walk 10 feet activity   Assist           Walk 50 feet activity   Assist           Walk 150 feet activity   Assist           Walk 10 feet on uneven surface  activity   Assist           Wheelchair     Assist                Wheelchair 50 feet with 2 turns activity    Assist            Wheelchair 150 feet activity     Assist          Blood pressure (!) 154/106, pulse 70, temperature 98.5 F (36.9 C), temperature source Oral, resp. rate 18, height 6\' 3"  (1.905 m), weight (!) 137 kg, SpO2 100 %.   Medical Problem List and Plan: 1.  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  Antithrombotics: -DVT/anticoagulation: Lovenox             -antiplatelet therapy: Aspirin 81 mg daily and Plavix 75 mg daily x3 weeks then aspirin alone 3. Pain Management: Oxycodone as needed, tramadol as needed, Fioricet as needed             Monitor headaches with increased exertion 4. Mood: Lexapro 10 mg daily Klonopin 0.5 mg as needed sleep             -antipsychotic agents: N/A 5. Neuropsych: This patient is?  Fully capable of making decisions on his own behalf. 6. Skin/Wound Care: Routine skin checks 7. Fluids/Electrolytes/Nutrition: Routine in and outs.  CMP ordered 8.  PFO.  Follow-up outpatient Dr. Excell Seltzerooper for consideration of closure 9.  Hypertension.  Norvasc 5 mg daily, Cozaar 50 mg daily.          Vitals:   11/22/19 2118 11/23/19 0439  BP:  (!) 154/106  Pulse:  70  Resp:  18  Temp: 99 F (37.2 C) 98.5 F (36.9 C)  SpO2:  100%  if remains elevated may need to adjust the amlodipine dose  10.  Hyperlipidemia.  Lipitor 11.  Persistent headaches-likely vascular.  Continue Depakote             See #3 12.  Sleep  apnea.  Continue CPAP  13. Fever x 1 , low grade 100.6  no recurrence thus far ? atelectasis UA - CBC nl 14. Nausea add scopolamine patch  LOS: 1  days A FACE TO FACE EVALUATION WAS PERFORMED  Erick Colace 11/23/2019, 8:04 AM

## 2019-11-23 NOTE — Progress Notes (Signed)
Patient has his home unit at bedside.  Asked patient if he needed anything done to his machine for him to use it tonight; patient stated no, that it was ready to go.  Patient self manages his own machine.

## 2019-11-23 NOTE — Evaluation (Signed)
Occupational Therapy Assessment and Plan  Patient Details  Name: SAMMUEL BLICK MRN: [id number] Date of Birth: 1970/08/30  OT Diagnosis: acute pain, cognitive deficits, disturbance of vision and muscle weakness (generalized) Rehab Potential: Rehab Potential (ACUTE ONLY): Excellent ELOS: 12-14 days   Today's Date: 11/23/2019 OT Individual Time: 7829-5621 OT Individual Time Calculation (min): 56 min     Hospital Problem: Principal Problem:   Ischemic stroke ([place]) - L PICA infarct embolic in setting of PFO s/p crani Active Problems:   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 ([place])   Past Medical History:  Past Medical History:  Diagnosis Date  . Arthritis    neck  . Carpal tunnel syndrome on both sides 08/2014  . DDD (degenerative disc disease), lumbar   . History of kidney stones   . Hypertension    under control with med., has been on med. x 1 yr.  . Sleep apnea    CPAP but does not use   Past Surgical History:  Past Surgical History:  Procedure Laterality Date  . BUBBLE STUDY  11/21/2019   Procedure: BUBBLE STUDY;  Surgeon: Buford Dresser, MD;  Location: [place];  Service: Cardiovascular;;  . CARPAL TUNNEL RELEASE Left 09/07/2014   Procedure: LEFT CARPAL TUNNEL RELEASE;  Surgeon: Roseanne Kaufman, MD;  Location: [place];  Service: Orthopedics;  Laterality: Left;  . CARPAL TUNNEL RELEASE Right 10/12/2014   Procedure: RIGHT CARPAL TUNNEL RELEASE;  Surgeon: Roseanne Kaufman, MD;  Location: [place];  Service: Orthopedics;  Laterality: Right;  . KNEE ARTHROSCOPY Left   . SUBOCCIPITAL CRANIECTOMY CERVICAL LAMINECTOMY N/A 11/17/2019   Procedure: SUBOCCIPITAL DECOMPRESSIVE CRANIECTOMY;  Surgeon: Judith Part, MD;  Location: [place];  Service: Neurosurgery;  Laterality: N/A;  . TEE WITHOUT CARDIOVERSION N/A 11/21/2019   Procedure: TRANSESOPHAGEAL ECHOCARDIOGRAM (TEE);  Surgeon: Buford Dresser, MD;  Location: [facility]  ENDOSCOPY;  Service: Cardiovascular;  Laterality: N/A;    Assessment & Plan Clinical Impression: Patient is a 49 y.o. year old male with recent admission to the hospital on 11/16/2019 with dizziness and gait abnormality.  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  Additional punctate acute infarct question within the left occipital lobe.  CT angiogram of head and neck negative for large vessel occlusion or stenosis.  Echocardiogram with ejection fraction of 65%, normal wall abnormalities.  TCD Doppler with bubble positive for PFO at rest.  TEE highly mobile intra-atrial septum.  But no clear PFO or ASD flow seen despite multiple angle windows.  Admission chemistries potassium 3.3, glucose 189 troponin negative alcohol negative lactic acid 5.2.  Hospital course further complicated by increasing headaches and lethargy.  Follow-up CT imaging showed brainstem compression and obstructive hydrocephalus with patient undergoing suboccipital craniotomy on 11/17/2019 per Dr. Venetia Constable.  Patient transferred to CIR on 11/22/2019 .    Patient currently requires mod with basic self-care skills secondary to muscle weakness, impaired timing and sequencing, unbalanced muscle activation, ataxia and decreased coordination, decreased visual acuity and decreased visual motor skills, decreased attention, decreased awareness and decreased safety awareness and decreased standing balance and decreased balance strategies.  Prior to hospitalization, patient could complete ADLs with independent .  Patient will benefit from skilled intervention to decrease level of assist with basic self-care skills and increase independence with basic self-care skills prior to discharge home with care partner.  Anticipate patient will require 24 hour supervision and follow up outpatient.  OT - End  of Session Activity Tolerance: Decreased this session Endurance Deficit: Yes OT  Assessment Rehab Potential (ACUTE ONLY): Excellent OT Patient demonstrates impairments in the following area(s): Balance;Safety;Endurance;Vision;Cognition;Pain OT Basic ADL's Functional Problem(s): Grooming;Bathing;Dressing;Toileting OT Advanced ADL's Functional Problem(s): Simple Meal Preparation OT Transfers Functional Problem(s): Tub/Shower;Toilet OT Additional Impairment(s): None OT Plan OT Intensity: Minimum of 1-2 x/day, 45 to 90 minutes OT Frequency: 5 out of 7 days OT Duration/Estimated Length of Stay: 12-14 days OT Treatment/Interventions: Balance/vestibular training;Discharge planning;Pain management;Self Care/advanced ADL retraining;Therapeutic Activities;UE/LE Coordination activities;Cognitive remediation/compensation;Disease mangement/prevention;Functional mobility training;Patient/family education;Therapeutic Exercise;Visual/perceptual remediation/compensation;Community reintegration;DME/adaptive equipment instruction;Neuromuscular re-education;UE/LE Strength taining/ROM;Psychosocial support OT Self Feeding Anticipated Outcome(s): independent OT Basic Self-Care Anticipated Outcome(s): supervision OT Toileting Anticipated Outcome(s): supervision OT Bathroom Transfers Anticipated Outcome(s): supervision OT Recommendation Recommendations for Other Services: Neuropsych consult Patient destination: Home Follow Up Recommendations: Outpatient OT;24 hour supervision/assistance Equipment Recommended: To be determined   Skilled Therapeutic Intervention Session 1: (1103-1159) Pt up in the recliner to start session, requesting to transfer to the bathroom.  He was able to complete transfer to the toilet with use of the RW with mod assist.  He was able to manage his clothing in sit to stand with min assist as well before ambulating out to the wheelchair at the sink.  He was able to complete UB bathing with setup as well as LB bathing with min assist sit to stand.  He needed mod assist for  standing balance in order to remove his dirty shorts and for donning new shorts and pullover shirt.  When attempting to donn his shirt, once his head was covered when attempting to pull the shirt over his head, he demonstrated significant LOB to the left and posteriorly.  He was able to finish in sitting with donning of gripper socks with setup as well as for completing oral hygiene.  Finished session with transfer back to the recliner to rest.  Call button and phone in reach with safety belt in place.    Session 2: ([id number])  Pt completed stand pivot transfer to the wheelchair with min assist using the RW for support.  Pt with increased headache this session rated at 9/10.  Nursing brought medications prior to transfer out of the room.  He was then rolled down to the therapy gym by therapist with completion of stand pivot transfer to the mat at min assist level.  Had pt transition to supine with supervision and then back to sitting at the same level.  No report of dizziness with this movement.  Completed vision testing in sitting with pt demonstrating decreased convergence of either eye and jerky tracking in both eyes with visual scanning.  VOR cancellation test was also positive with catch up saccade noted with both left and right horizontal scanning.  Had pt work on reading near vision chart and he reported increased difficulty when attempting to read as the letters would run together and overlap.  He exhibited near vision of 20/50 for reading without his glasses.  Far vision testing was at 20/80.  Had him work on standing balance as well during session with pt standing and completing head movements side to side and up and down.  Min assist needed to maintain balance.  Also had him close his eyes and try and maintain his balance with mod assist needed secondary to LOB posteriorly.  Finished session with ambulation back to the room at mod assist level initially progressing to min assist level with use of the  RW for support.  He transferred to the   bed on his right side at conclusion of session.  He was left with the call button and phone in reach and bed alarm in place.     OT Evaluation Precautions/Restrictions  Precautions Precautions: Fall Precaution Comments: increased light sensitivity Restrictions Weight Bearing Restrictions: No  Pain Pain Assessment Pain Scale: 0-10 Pain Score: 9  Faces Pain Scale: No hurt Pain Type: Acute pain Pain Location: Head Pain Orientation: Medial Pain Descriptors / Indicators: Aching;Headache Pain Onset: On-going Pain Intervention(s): Repositioned;RN made aware Home Living/Prior Functioning Home Living Family/patient expects to be discharged to:: Private residence Living Arrangements: Spouse/significant other Available Help at Discharge: Family, Available 24 hours/day Type of Home: House Home Access: Stairs to enter Technical brewer of Steps: 3 Entrance Stairs-Rails: Right, Left Home Layout: One level Bathroom Shower/Tub: Chiropodist: Handicapped height Bathroom Accessibility: Yes  Lives With: Spouse IADL History Homemaking Responsibilities: No Current License: Yes Mode of Transportation: Musician Occupation: Full time employment Type of Occupation: worked as a Chief of Staff for [employer] and Hobbies: Programmer, multimedia, working on cars, working in his shop Prior Function Level of Independence: Independent with basic ADLs  Able to Take Stairs?: Yes Driving: Yes Vocation: Full time employment Leisure: Hobbies-yes (Comment) Comments: works as a Building control surveyor; enjoys classic cars ADL ADL Eating: Independent Where Assessed-Eating: Chair Grooming: Supervision/safety Where Assessed-Grooming: Clinical biochemist Bathing: Supervision/safety Where Assessed-Upper Body Bathing: Wheelchair Lower Body Bathing: Minimal assistance Where Assessed-Lower Body Bathing: Wheelchair Upper Body Dressing: Moderate assistance  (standing) Where Assessed-Upper Body Dressing: Standing at sink Lower Body Dressing: Minimal assistance Where Assessed-Lower Body Dressing: Wheelchair, Standing at sink, Sitting at sink Toileting: Moderate assistance Where Assessed-Toileting: Glass blower/designer: Moderate assistance Toilet Transfer Method: Counselling psychologist: Raised toilet seat Vision Baseline Vision/History: No visual deficits Patient Visual Report: Eye fatigue/eye pain/headache;Other (comment);Blurring of vision Vision Assessment?: Yes Eye Alignment: Within Functional Limits Ocular Range of Motion: Within Functional Limits Alignment/Gaze Preference: Within Defined Limits Tracking/Visual Pursuits: Decreased smoothness of horizontal tracking;Decreased smoothness of vertical tracking Saccades: Decreased speed of saccadic movement;Impaired - to be further tested in functional context Convergence: Impaired (comment) (Pt with decreased convergence bilaterally.) Visual Fields: No apparent deficits Additional Comments: Pt with some increased light sensitivity when taken to the therapy gym.  While attempting reading activity, he reported that the letters of the words would run together when looking with both eyes.  When covering one eye he was able to see the words and read clearly up to 20/50.  With distant vision he was able to read 90% of letters on the 20/80 line. Perception  Perception: Within Functional Limits Praxis Praxis: Intact Cognition Overall Cognitive Status: Impaired/Different from baseline Arousal/Alertness: Awake/alert Orientation Level: Person;Place;Situation Person: Oriented Place: Oriented Situation: Oriented Year: 2021 Month: July Day of Week: Correct Memory: Appears intact Immediate Memory Recall: Sock;Blue;Bed Memory Recall Sock: Without Cue Memory Recall Blue: Without Cue Memory Recall Bed: Without Cue Attention: Focused Focused Attention: Appears intact Awareness:  Impaired Awareness Impairment: Intellectual impairment;Anticipatory impairment Problem Solving: Appears intact Safety/Judgment: Impaired Comments: Pt trying to stand and donn his shirt as well as doff his pants with significant LOB demonstrating decreased awareness of his deficits Sensation Sensation Light Touch: Appears Intact Hot/Cold: Appears Intact Proprioception: Appears Intact Stereognosis: Appears Intact Additional Comments: pt reports now it feels like his "feet get cold",Pt with intact sensation in BUEs Coordination Gross Motor Movements are Fluid and Coordinated: Yes Fine Motor Movements are Fluid and Coordinated: Yes Coordination and Movement Description: BUE coordination intact with  finger to nose. Slower than normal gait pattern noted with wide BOS during functional mobility. Heel Shin Test: WFLs Motor  Motor Motor: Ataxia Motor - Skilled Clinical Observations: slight trunkial ataxia noted in standing Mobility  Bed Mobility Bed Mobility: Sit to Supine Sit to Supine: Contact Guard/Touching assist Transfers Sit to Stand: Minimal Assistance - Patient > 75% Stand to Sit: Minimal Assistance - Patient > 75%  Trunk/Postural Assessment  Cervical Assessment Cervical Assessment: Within Functional Limits Thoracic Assessment Thoracic Assessment: Within Functional Limits Lumbar Assessment Lumbar Assessment: Within Functional Limits Postural Control Postural Control: Deficits on evaluation Righting Reactions: delayed  Balance Balance Balance Assessed: Yes Static Sitting Balance Static Sitting - Balance Support: Feet supported Static Sitting - Level of Assistance: 5: Stand by assistance Dynamic Sitting Balance Dynamic Sitting - Balance Support: During functional activity Dynamic Sitting - Level of Assistance: 5: Stand by assistance Static Standing Balance Static Standing - Balance Support: During functional activity;No upper extremity supported Static Standing - Level  of Assistance: 4: Min assist Dynamic Standing Balance Dynamic Standing - Balance Support: During functional activity;No upper extremity supported Dynamic Standing - Level of Assistance: 3: Mod assist Extremity/Trunk Assessment RUE Assessment RUE Assessment: Within Functional Limits LUE Assessment LUE Assessment: Within Functional Limits     Refer to Care Plan for Long Term Goals  Recommendations for other services: Neuropsych   Discharge Criteria: Patient will be discharged from OT if patient refuses treatment 3 consecutive times without medical reason, if treatment goals not met, if there is a change in medical status, if patient makes no progress towards goals or if patient is discharged from hospital.  The above assessment, treatment plan, treatment alternatives and goals were discussed and mutually agreed upon: by patient and by family  MCGUIRE,JAMES OTR/L  11/23/2019, 4:53 PM  

## 2019-11-24 ENCOUNTER — Inpatient Hospital Stay (HOSPITAL_COMMUNITY): Payer: 59 | Admitting: Physical Therapy

## 2019-11-24 ENCOUNTER — Inpatient Hospital Stay (HOSPITAL_COMMUNITY): Payer: 59 | Admitting: Occupational Therapy

## 2019-11-24 MED ORDER — TRAMADOL HCL 50 MG PO TABS
100.0000 mg | ORAL_TABLET | Freq: Four times a day (QID) | ORAL | Status: DC | PRN
  Administered 2019-11-24 – 2019-12-04 (×21): 100 mg via ORAL
  Filled 2019-11-24 (×22): qty 2

## 2019-11-24 MED ORDER — OXYCODONE HCL ER 10 MG PO T12A
10.0000 mg | EXTENDED_RELEASE_TABLET | Freq: Every day | ORAL | Status: DC
  Administered 2019-11-24 – 2019-11-27 (×4): 10 mg via ORAL
  Filled 2019-11-24 (×4): qty 1

## 2019-11-24 NOTE — Progress Notes (Signed)
Occupational Therapy Session Note  Patient Details  Name: [name] MRN: [id number] Date of Birth: 01-17-1971  Today's Date: 11/24/2019 OT Individual Time: 1102-1207 OT Individual Time Calculation (min): 65 min    Short Term Goals: Week 1:  OT Short Term Goal 1 (Week 1): Pt will complete tub shower transfers with min guard assist using the tub bench and RW for support. OT Short Term Goal 2 (Week 1): Pt will complete LB selfcare with min guard assist sit to stand for two consecutive session. OT Short Term Goal 3 (Week 1): Pt will complete convergence and tracking exercises with independence following handout. OT Short Term Goal 4 (Week 1): Pt will complete toilet transfers with min guard assist using the RW for support.  Skilled Therapeutic Interventions/Progress Updates:    Pt completed toilet transfer with use of the RW for support and min assist to begin session.  He was able to complete all aspects of toileting with min assist sit to stand and then transferred over to the tub bench at min assist level with use of the grab bars for support.  He needed min instructional cueing for sequencing with min instructional cueing for safety.  He stood and propped his LEs up on the tub bench while holding the grab bar for support when washing them, with min assist for balance.  Discussed that it would likely not be safe to do this when he gets home until his balance has improved.  He also did this to dry them after the shower.  He next transferred out to the recliner for dressing with min assist and no assistive device.  He was able to complete donning a pullover shirt with setup and then donned his shorts with min guard sit to stand.  After donning his gripper socks with setup, he was able to stand at the sink with min assist for completion of oral hygiene.  Once ADL was completed had pt work on Manufacturing engineer with use of the [employer].  He was able to exhibit convergence at around three ft when  looking at the bead, but he could not achieve consistency with this at closer range.  Educated pt on visual tracking exercises as well as convergence exercises to be completed daily while in his room.  Handout also provided as reference.  Finished session with call button and phone in reach with safety alarm belt in place.    Therapy Documentation Precautions:  Precautions Precautions: Fall Precaution Comments: increased light sensitivity Restrictions Weight Bearing Restrictions: No  Pain:  See Pain flowsheet for details  ADL: See Care Tool Section for some details of mobility and selfcare   Therapy/Group: Individual Therapy  Taniaya Rudder OTR/L 11/24/2019, 4:12 PM

## 2019-11-24 NOTE — Progress Notes (Signed)
Pt refused home CPAP unit. He stated that it chokes him up at night. He was educated. Pt complains of severe pain 10/10 in head. He states that there is a little bit of soreness at the incision site. PRN pain  medication administered, will continue to monitor.   Sheritta Deeg, lpn

## 2019-11-24 NOTE — Care Management (Signed)
Patient ID: [name], male   DOB: 1971/05/05, 49 y.o.   MRN: [id number] Met with patient to review role of CM and collaboration with SW Margreta Journey). Patient is very drowsy. The patient reported he has not been sleeping well at night and therefore sleeps anytime during the day he is not in therapy. Handouts left in the room for nursing to review/reinforce with the wife on DASH diet, cooking with less salt, and dyslipidemia along with exercise for your health/heart along with secondary stroke prevention measures. Patient is continent of bowel and bladder. No other nursing issues noted at present. Margarito Liner

## 2019-11-24 NOTE — IPOC Note (Signed)
Overall Plan of Care [facility]) Patient Details Name: [name] MRN: [id number] DOB: 08-Dec-1970  Admitting Diagnosis: Ischemic stroke [facility])  Hospital Problems: Principal Problem:   Ischemic stroke ([facility]) - L PICA infarct embolic in setting of PFO s/p crani Active Problems:   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)     Functional Problem List: Nursing Medication Management, Nutrition, Pain, Safety  PT Balance, Motor, Safety, Behavior, Nutrition, Sensory, Edema, Pain, Skin Integrity, Endurance, Perception  OT Balance, Safety, Endurance, Vision, Cognition, Pain  SLP    TR         Basic ADL's: OT Grooming, Bathing, Dressing, Toileting     Advanced  ADL's: OT Simple Meal Preparation     Transfers: PT Bed Mobility, Bed to Chair, Car, Lobbyist, Technical brewer: PT Ambulation, Stairs     Additional Impairments: OT None  SLP        TR      Anticipated Outcomes Item Anticipated Outcome  Self Feeding independent  Swallowing      Basic self-care  supervision  Toileting  supervision   Bathroom Transfers supervision  Bowel/Bladder  to remain cont x 2, LBM [id number]  Transfers  supervision  Locomotion  supervision using LRAD  Communication     Cognition     Pain  less than 3 out of 10  Safety/Judgment  pt will be free from falls while in rehab   Therapy Plan: PT Intensity: Minimum of 1-2 x/day ,45 to 90 minutes PT Frequency: 5 out of 7 days PT Duration Estimated Length of Stay: ~ 2 weeks OT Intensity: Minimum of 1-2 x/day, 45 to 90 minutes OT Frequency: 5 out of 7 days OT Duration/Estimated Length of Stay: 12-14 days     Due to the current state of emergency, patients may not be receiving their 3-hours of Medicare-mandated therapy.   Team Interventions: Nursing Interventions Patient/Family Education, Disease Management/Prevention, Pain Management, Medication Management, Discharge Planning  PT interventions  Ambulation/gait training, Balance/vestibular training, Cognitive remediation/compensation, Community reintegration, Discharge planning, Disease management/prevention, DME/adaptive equipment instruction, Functional electrical stimulation, Functional mobility training, Neuromuscular re-education, Pain management, Patient/family education, Psychosocial support, Skin care/wound management, Splinting/orthotics, Stair training, Therapeutic Activities, Therapeutic Exercise, UE/LE Strength taining/ROM, UE/LE Coordination activities, Visual/perceptual remediation/compensation  OT Interventions Balance/vestibular training, Discharge planning, Pain management, Self Care/advanced ADL retraining, Therapeutic Activities, UE/LE Coordination activities, Cognitive remediation/compensation, Disease mangement/prevention, Functional mobility training, Patient/family education, Therapeutic Exercise, Visual/perceptual remediation/compensation, Community reintegration, DME/adaptive equipment instruction, Neuromuscular re-education, UE/LE Strength taining/ROM, Psychosocial support  SLP Interventions    TR Interventions    SW/CM Interventions Discharge Planning, Psychosocial Support, Patient/Family Education   Barriers to Discharge MD  Medical stability and Weight  Nursing      PT Inaccessible home environment, Behavior, Weight, Home environment access/layout    OT      SLP      SW       Team Discharge Planning: Destination: PT-Home ,OT- Home , SLP-  Projected Follow-up: PT-Outpatient PT, 24 hour supervision/assistance, OT-  Outpatient OT, 24 hour supervision/assistance, SLP-  Projected Equipment Needs: PT-To be determined, OT- To be determined, SLP-  Equipment Details: PT- , OT-  Patient/family involved in discharge planning: PT- Patient,  OT- , SLP-   MD ELOS: 12-15d Medical Rehab Prognosis:  Excellent Assessment:  49 year old right-handed male history of hypertension, sleep apnea with intermittent CPAP,  hyperlipidemia, depression with anxiety, obesity with BMI 39.68.  History taken from chart review, family, and patient due  to lethargy mentation.  Patient lives with spouse.  He works as a Psychologist, occupational.  1 level home 3 steps to entry.  He presented on 11/16/2019 with dizziness and gait abnormality.  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  Additional punctate acute infarct question within the left occipital lobe.  CT angiogram of head and neck negative for large vessel occlusion or stenosis.  Echocardiogram with ejection fraction of 65%, normal wall abnormalities.  TCD Doppler with bubble positive for PFO at rest.  TEE highly mobile intra-atrial septum.  But no clear PFO or ASD flow seen despite multiple angle windows.  Admission chemistries potassium 3.3, glucose 189 troponin negative alcohol negative lactic acid 5.2.  Hospital course further complicated by increasing headaches and lethargy.  Follow-up CT imaging showed brainstem compression and obstructive hydrocephalus with patient undergoing suboccipital craniotomy on 11/17/2019 per Dr. Johnsie Cancel.  Neurology follow-up and patient was cleared to begin aspirin and Plavix for CVA prophylaxis x3 weeks and aspirin alone.  Patient would follow-up Dr. Excell Seltzer to consider PFO closure as an outpatient.  Patient was cleared to begin subcutaneous Lovenox for DVT prophylaxis.  Maintained on Depakote for persistent headaches.  Close monitoring of blood pressure initially on Cleviprex.  Tolerating a regular diet.  Hospital course further complicated by leukocytosis. Patient was admitted for a comprehensive rehab    Now requiring 24/7 Rehab RN,MD, as well as CIR level PT, OT and SLP.  Treatment team will focus on ADLs and mobility with goals set at Supervision  See Team Conference Notes for weekly updates to the plan of care

## 2019-11-24 NOTE — Progress Notes (Signed)
[facility] PHYSICAL MEDICINE & REHABILITATION PROGRESS NOTE   Subjective/Complaints:  Intermittent headaches, both in the posterior area around incision as well as top of the head. No new numbness tingling weakness. 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 Participates well. Poor sleep patient states he is able to fall asleep but then wakes up with a headache about an hour and a half later.  Spoke at length to patient's wife at bedside and sister via phone  ROS- neg CP, SOB, N/V/D Objective:   DG CHEST PORT 1 VIEW  Result Date: 11/22/2019 CLINICAL DATA:  Fevers EXAM: PORTABLE CHEST 1 VIEW COMPARISON:  11/16/2019 FINDINGS: Cardiac shadow is enlarged but accentuated by the portable technique. The lungs are clear bilaterally. No focal infiltrate or sizable effusion is noted. No bony abnormality is noted. IMPRESSION: No acute abnormality noted. Electronically Signed   By: Alcide Clever M.D.   On: 11/22/2019 20:12   Recent Labs    11/23/19 0511  WBC 7.1  HGB 14.6  HCT 41.9  PLT 227   Recent Labs    11/23/19 0511  NA 138  K 3.5  CL 103  CO2 24  GLUCOSE 99  BUN 10  CREATININE 0.77  CALCIUM 9.0   No intake or output data in the 24 hours ending 11/24/19 0932   Physical Exam: Vital Signs Blood pressure 128/84, pulse 77, temperature 98 F (36.7 C), temperature source Oral, resp. rate 18, height 6\' 3"  (1.905 m), weight (!) 137 kg, SpO2 100 %.    General: No acute distress Mood and affect are appropriate Heart: Regular rate and rhythm no rubs murmurs or extra sounds Lungs: Clear to auscultation, breathing unlabored, no rales or wheezes Abdomen: Positive bowel sounds, soft nontender to palpation, nondistended Extremities: No clubbing, cyanosis, or edema Skin: No evidence of breakdown, suboccipital craniotomy incision healing well Neurologic: Cranial nerves II through XII intact, motor strength is 5/5 in bilateral deltoid, bicep, tricep,  grip, hip flexor, knee extensors, ankle dorsiflexor and plantar flexor Sensory exam normal sensation to light touch and proprioception in bilateral upper and lower extremities Cerebellar exam normal finger to nose to finger as well as heel to shin in bilateral upper and lower extremities Musculoskeletal: Full range of motion in all 4 extremities. No joint swelling Good sitting balance Musculoskeletal: Full range of motion in all 4 extremities. No joint swelling   Assessment/Plan: 1. 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  Physiatrist is providing close team supervision and 24 hour management of active medical problems listed below.  Physiatrist and rehab team continue to assess barriers to discharge/monitor patient progress toward functional and medical goals  Care Tool:  Bathing  Bathing activity did not occur:  (N/A) Body parts bathed by patient: Right arm, Left arm, Chest, Abdomen, Front perineal area, Buttocks, Right upper leg, Face, Right lower leg, Left lower leg, Left upper leg         Bathing assist Assist Level: Minimal Assistance - Patient > 75%     Upper Body Dressing/Undressing Upper body dressing Upper body dressing/undressing activity did not occur (including orthotics): N/A What is the patient wearing?: Pull over shirt    Upper body assist Assist Level: Moderate Assistance - Patient 50 - 74% (standing)    Lower Body Dressing/Undressing Lower body dressing    Lower body dressing activity did not occur: N/A What is the patient  wearing?: Pants     Lower body assist Assist for lower body dressing: Minimal Assistance - Patient > 75%     Toileting Toileting    Toileting assist Assist for toileting: Contact Guard/Touching assist     Transfers Chair/bed transfer  Transfers assist     Chair/bed transfer assist level: Moderate Assistance -  Patient 50 - 74% (no AD/UE support)     Locomotion Ambulation   Ambulation assist      Assist level: 2 helpers (unable to safely ambulate with 1 person without using AD) Assistive device: Hand held assist (2 person HHA) Max distance: 79ft   Walk 10 feet activity   Assist     Assist level: 2 helpers (unable to safely ambulate with 1 person without using AD) Assistive device: Hand held assist (B HHA)   Walk 50 feet activity   Assist    Assist level: 2 helpers Assistive device: Hand held assist [facility])    Walk 150 feet activity   Assist Walk 150 feet activity did not occur: Safety/medical concerns         Walk 10 feet on uneven surface  activity   Assist Walk 10 feet on uneven surfaces activity did not occur: Safety/medical concerns         Wheelchair     Assist Will patient use wheelchair at discharge?:  (TBD but do not anticipate)             Wheelchair 50 feet with 2 turns activity    Assist            Wheelchair 150 feet activity     Assist          Blood pressure 128/84, pulse 77, temperature 98 F (36.7 C), temperature source Oral, resp. rate 18, height 6\' 3"  (1.905 m), weight (!) 137 kg, SpO2 100 %.   Medical Problem List and Plan: 1.  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  Antithrombotics: -DVT/anticoagulation: Lovenox             -antiplatelet therapy: Aspirin 81 mg daily and Plavix 75 mg daily x3 weeks then aspirin alone 3. Pain Management: Oxycodone as needed, tramadol as needed, Fioricet as needed             Monitor headaches with increased exertion 4. Mood: Lexapro 10 mg daily  Klonopin 0.5 mg as needed sleep             -antipsychotic agents: N/A 5. Neuropsych: This patient is?  Fully capable of making decisions on his own behalf. 6. Skin/Wound Care: Routine skin checks 7. Fluids/Electrolytes/Nutrition: Routine in and outs.  CMP ordered 8.  PFO.  Follow-up outpatient Dr. 01/18/2020 for consideration of closure 9.  Hypertension.  Norvasc 5 mg daily, Cozaar 50 mg daily.          Vitals:   11/23/19 2011 11/24/19 0440  BP: (!) 139/100 128/84  Pulse: 99 77  Resp: 18 18  Temp: 98.9 F (37.2 C) 98 F (36.7 C)  SpO2: (!) 87% 100%  if remains elevated may need to  adjust the amlodipine dose, improved this morning 7/9 10.  Hyperlipidemia.  Lipitor 11.  Persistent headaches-likely vascular.  Continue Depakote             See #3, nighttime pain OxyContin 10 mg daytime pain increase tramadol to 100 mg 4 times daily, consider switch from Depakote to Topamax 12.  Sleep apnea.  Continue CPAP  13. Fever x 1 , low grade 100.6  no recurrence thus far ? atelectasis UA - CBC nl 14. Nausea improved, monitor LOS: 2 days A FACE TO FACE EVALUATION WAS PERFORMED  Erick Colace 11/24/2019, 9:32 AM

## 2019-11-24 NOTE — Progress Notes (Signed)
Physical Therapy Session Note  Patient Details  Name: [name] MRN: [id number] Date of Birth: 1970-06-05  Today's Date: 11/24/2019 PT Individual Time: [id number] and 1640-1735  PT Individual Time Calculation (min): 59 min and 55 min  Short Term Goals: Week 1:  PT Short Term Goal 1 (Week 1): Pt will perform sit<>stands with CGA PT Short Term Goal 2 (Week 1): Pt will perform bed<>chair transfers with CGA PT Short Term Goal 3 (Week 1): Pt will ambulate at least 180ft using LRAD with CGA PT Short Term Goal 4 (Week 1): Pt will ascend/descend 4 steps using HRs with min assist  Skilled Therapeutic Interventions/Progress Updates:    Session 1: Pt received sitting in recliner eating breakfast with his wife present. Pt states "I'm hurting so bad I can't concentrate" and then reports he had a bad night because the pain is worse at that time. Despite this, pt agreeable to therapy session with minimal encouragement from his wife. Stand pivot to w/c using RW with min assist for balance.  Transported to/from gym in w/c for time management and energy conservation. Gait training 178ft using RW with min assist for balance - demonstrates very slow gait speed, reciprocal stepping pattern with slight R foot drop and increased postural sway. Patient participated in [facility] and demonstrates increased fall risk as noted by score of 8/56. (<36= high risk for falls, close to 100%; 37-45 significant >80%; 46-51 moderate >50%; 52-55 lower >25%). Pt requires frequent seated rest breaks during test due to feeling unbalanced and nauseous - educated on importance of increased balance challenges to relearn force regulation and retrain balance strategies. MD in/out for morning assessment - discussed truncal ataxia. Transported back to room in w/c. Stand pivot w/c>recliner, no AD, with min assist for balance. Pt left seated in recliner with needs in reach, seat belt alarm on, and B LEs elevated.   Session 2: Pt received  sitting in recliner stating he was eager to get up and go walking. Pt reports his goal is to be independent with ambulation. Pt agreeable to therapy session. Sit<>stands using RW with CGA intermittnet min assist for steadying/balance - continues to demonstrate posterior LOB primarily. Gait training ~132ft to main gym using RW with min assist for balance - demonstrates very slow, guarded gait pattern with wider BOS and increased postural sway. Ascended/descended 12 steps using R UE support on each handrail to replicate home set-up with CGA/min assist for steadying - pt self-selecting reciprocal pattern then auto-correcting to step-to pattern leading with R LE for increased stability. Gait training ~64ft using RW to mat with CGA for steadying - pt continuing to demonstrate very slow gait pattern frequently stopping when trying to talk due to impaired dual-task abilities. Donned maxi-sky harness in sitting and standing with CGA/close supervision for safety using RW for support in standing. Gait training in maxi-sky harness (providing slight upward tension) down/back ~15times ambulating forwards focusing on decreased UE support - initially had R UE HHA progressed to no UE support - pt beginning with very slow gait speed, increased postural sway, guarded trunk, no arm swing, and wider BOS - all of these improved with increased repetitions. Demonstrates more instability with turning again with pt having primarily posterior lean/LOB. Progressed to lateral side stepping in maxi-sky harness with pt requiring min assist for balance during this task due to posterior lean/LOB that was more impaired when stepping towards R. Doffed harness as described above. Gait training ~135ft back to room using RW - pt  demonstrating improving gait mechanics with decreasing postural sway and increasing gait speed. Pt left sitting in recliner with needs in reach and chair alarm on.  Therapy Documentation Precautions:   Precautions Precautions: Fall Precaution Comments: increased light sensitivity Restrictions Weight Bearing Restrictions: No  Pain: Session 1: Pt states his pain is very high, unrated, but reports all pain medication pre-administered. Therapist provided rest breaks for pain management during session. Additional details above.  Session 2: No reports of pain throughout session.  Balance: Standardized Balance Assessment Standardized Balance Assessment: Berg Balance Test Berg Balance Test Sit to Stand: Needs minimal aid to stand or to stabilize (posterior LOB upon standing using backs of legs on mat and requiring min assist for balance) Standing Unsupported: Unable to stand 30 seconds unassisted Sitting with Back Unsupported but Feet Supported on Floor or Stool: Able to sit safely and securely 2 minutes Stand to Sit: Needs assistance to sit Transfers: Needs one person to assist Standing Unsupported with Eyes Closed: Unable to keep eyes closed 3 seconds but stays steady Standing Ubsupported with Feet Together: Needs help to attain position but able to stand for 30 seconds with feet together From Standing, Reach Forward with Outstretched Arm: Loses balance while trying/requires external support (posterior lean) From Standing Position, Pick up Object from Floor: Unable to try/needs assist to keep balance (did not feel safe to attempt) From Standing Position, Turn to Look Behind Over each Shoulder: Needs assist to keep from losing balance and falling (has R posteroir LOB using mat and min assist to recover) Turn 360 Degrees: Needs assistance while turning (able to complete but requires min assist for balance) Standing Unsupported, Alternately Place Feet on Step/Stool: Needs assistance to keep from falling or unable to try (increased ataxia noted when placing R LE on step) Standing Unsupported, One Foot in Front: Loses balance while stepping or standing Standing on One Leg: Unable to try or needs  assist to prevent fall Total Score: 8/56   Therapy/Group: Individual Therapy  Ginny Forth, PT, DPT, CSRS  11/24/2019, 8:30 AM

## 2019-11-24 NOTE — Progress Notes (Signed)
Patient is stating he hasn't slept well the past couple nights and the PRN sleep medication has not helped. Patient is asking for a different sleep medication. Nurse said she would write progress note.

## 2019-11-25 ENCOUNTER — Inpatient Hospital Stay (HOSPITAL_COMMUNITY): Payer: 59 | Admitting: Occupational Therapy

## 2019-11-25 ENCOUNTER — Inpatient Hospital Stay (HOSPITAL_COMMUNITY): Payer: 59 | Admitting: Physical Therapy

## 2019-11-25 DIAGNOSIS — I63531 Cerebral infarction due to unspecified occlusion or stenosis of right posterior cerebral artery: Secondary | ICD-10-CM

## 2019-11-25 DIAGNOSIS — R7401 Elevation of levels of liver transaminase levels: Secondary | ICD-10-CM

## 2019-11-25 DIAGNOSIS — I10 Essential (primary) hypertension: Secondary | ICD-10-CM

## 2019-11-25 DIAGNOSIS — G441 Vascular headache, not elsewhere classified: Secondary | ICD-10-CM

## 2019-11-25 MED ORDER — PROPRANOLOL HCL 20 MG PO TABS
20.0000 mg | ORAL_TABLET | Freq: Three times a day (TID) | ORAL | Status: DC
  Administered 2019-11-25 – 2019-11-26 (×4): 20 mg via ORAL
  Filled 2019-11-25 (×4): qty 1

## 2019-11-25 MED ORDER — TOPIRAMATE 25 MG PO TABS
25.0000 mg | ORAL_TABLET | Freq: Two times a day (BID) | ORAL | Status: DC
  Administered 2019-11-25 – 2019-11-27 (×6): 25 mg via ORAL
  Filled 2019-11-25 (×7): qty 1

## 2019-11-25 NOTE — Progress Notes (Signed)
Physical Therapy Session Note  Patient Details  Name: [name] MRN: [id number] Date of Birth: [birth year]  Today's Date: 11/25/2019 PT Individual Time: 1312-1410 and 0569-7948 PT Individual Time Calculation (min): 58 min and 31 min  Short Term Goals: Week 1:  PT Short Term Goal 1 (Week 1): Pt will perform sit<>stands with CGA PT Short Term Goal 2 (Week 1): Pt will perform bed<>chair transfers with CGA PT Short Term Goal 3 (Week 1): Pt will ambulate at least 164ft using LRAD with CGA PT Short Term Goal 4 (Week 1): Pt will ascend/descend 4 steps using HRs with min assist  Skilled Therapeutic Interventions/Progress Updates:    Session 1: Pt received L sidelying in bed asleep, but easily awakens and agreeable to therapy session. Pt reports he didn't get any rest last night causing terrible headache but does report some improvement in symptoms since resting today. Supine>sitting L EOB with supervision. Sit<>stands using RW with CGA for steadying during session (pt will use backs of legs on mat/bed for support if he feels unsteady). Gait training ~184ft to main gym using RW with CGA/min assist for steadying - continues to demonstrate decreased gait speed with guarded trunk and no cervical rotation with pt avoiding rotating head or visually scanning due to this causing increased instability. Standing with RW and CGA/close supervision for safety while therapist donned maxi-sky harness.  While in harness with it providing slight upward tension performed the following dynamic gait and dynamic standing balance tasks: - down/back forward walking no UE support ~8 laps - cued to turn around in both directions (pt initially only turning around to L) - demonstrates increased L stance time with slight high steppage gait in R LE due to hx of foot drop (discussed trying toe-up brace with pt in agreement - will ask family to bring lighter tennis shoe).  - alternate foot taps on 8" step - initially having moderate  posterior lean, cuing for improved balance strategy - min assist for balance - lateral side stepping over hockey stick - mild to moderate postural sway requiring min assist for balance Pt continues to stop a task to recenter himself when minor LOB occurs demonstrating good safety awareness - educated pt on importance of challenging his balance while in safe environment to retrain those systems. Pt continues to move at a very slow, guarded speed with all movements/activities as he does not like to feel unsteady. Gait training ~131ft back to room using RW with CGA for steadying. Sit>supine, HOB flat, with supervision. Pt left L sidelying in bed with needs in reach and bed alarm on.  Session 2: Pt received standing at sink with Fannie Knee, RN present. Pt reports when he was trying to eat his lunch he suddenly started sweating all over and not feeling well so he called the nurse in to assess him - pt reports his vitals WNL and nurse cleared pt to participate in session. Gait training ~156ft to gym using RW with CGA for steadying - continues to demonstrate slow gait speed with guarded trunk and pt avoiding cervical rotation or visual scanning as this causes increased unsteadiness. Participated in the following static standing balance tasks - eyes closed 20 seconds - mild postural sway with min assist for balance - progressed to narrower stance eyes closed 20 seconds - no significant increase in postural sway - R/L cervical rotation x20 reps at very slow speed - mild postural sway min assist - R/L trunk/hip rotation x20 reps at very slow speed - moderate  postural sway especially to the R requiring light mod assist for balance Discussed how to properly perform the X1 gaze stabilization exercise that was provided by OT - pt planning to do this tomorrow. Pt continues to demonstrate moving at a very slow, guarded speed with all movements. Gait training ~144ft back to room using RW with CGA for steadying - continues to  demonstrate above gait impairments. Pt left sitting in recliner, needs in reach, B LEs elevated, and seat belt alarm on.   Therapy Documentation Precautions:  Precautions Precautions: Fall Precaution Comments: increased light sensitivity Restrictions Weight Bearing Restrictions: No  Pain: Session 1: Reports significant headache last night but does not talk about currently having pain during session.  Session 2: RN reports pt complaining of headache - RN confirms all medications have been provided - pt does not complain of pain with therapist and this does not limit his participation.   Therapy/Group: Individual Therapy  Ginny Forth, PT, DPT, CSRS  11/25/2019, 1:09 PM

## 2019-11-25 NOTE — Progress Notes (Signed)
Patient constantly complaining of severe headache despite all medications both scheduled and prn given.  Called Dr. Allena Katz, new orders received to discontinue depakote and start topamax.  Patient informed.  Will give med once pharmacy approves.  Will continue to monitor.  Dani Gobble, RN

## 2019-11-25 NOTE — Progress Notes (Signed)
Pt upset this morning d/t pt yelling throughout the night. " I am not usually like this but I didn't get any sleep last night that guy kept yelling all night and now I have a wicked headache."

## 2019-11-25 NOTE — Progress Notes (Signed)
Pt refused his morning colace. Stated he has had good bowel movement without difficulty. Pt was educated to risks and benefits and the rationale of the medication. Pt voiced understand and agreed to resume use of medication should he not have a bowel movement after two days.

## 2019-11-25 NOTE — Progress Notes (Signed)
+/-   sleep related to HA and patient yelling down the hall. At 2100, paged Dr. Allena Katz R/T elevated BP and patient complaint of "achy" pain to left chest, tender to palpation. Orders to change Norvasc to HS. PRN Klonipin and ultram given at 2122. PRN fioricet given at 2355 & 0613. Complaining of HA. Absent  Dorsiflex to right foot, not new per patient. Left calf (18inches)  noticeably larger than right (16 3/4), also not new, according to patient. Alfredo Martinez A

## 2019-11-25 NOTE — Progress Notes (Signed)
Occupational Therapy Session Note  Patient Details  Name: [name] MRN: [id number] Date of Birth: 29-Dec-1970  Today's Date: 11/25/2019 OT Individual Time: 2585-2778 and 1451-1534 OT Individual Time Calculation (min): 62 min and 43 min  Short Term Goals: Week 1:  OT Short Term Goal 1 (Week 1): Pt will complete tub shower transfers with min guard assist using the tub bench and RW for support. OT Short Term Goal 2 (Week 1): Pt will complete LB selfcare with min guard assist sit to stand for two consecutive session. OT Short Term Goal 3 (Week 1): Pt will complete convergence and tracking exercises with independence following handout. OT Short Term Goal 4 (Week 1): Pt will complete toilet transfers with min guard assist using the RW for support.  Skilled Therapeutic Interventions/Progress Updates:    Pt greeted in bed with RN present to administer pain medicine. Pt reported not sleeping well last night due to neighboring pt "screaming all night," RN confirming this from night shift report. He was agreeable to tx but required increased time/rest breaks due to fatigue. He engaged in toileting (using standard toilet, +bladder void), bathing (sit<stand in shower), dressing (LB from TTB sit<stand and UB while sitting in the recliner), oral care/grooming tasks (sitting in recliner) during session. Tx focus was placed on dynamic standing balance, functional transfers, and ADL retraining. All functional transfers completed at ambulatory level using RW with CGA. Pt required vcs for AD management before transferring to surfaces however he preferred to position the walker in specific ways, also adamant he wanted to use the grab bars to transfer into the shower vs use device. Advised him to use device for transfer to prepare for shower transfers at home where he will not have grab bars positioned this way. Supervision for dynamic standing balance during LB bathing/dressing tasks with use of the grab bars for  standing support. He sat in the recliner and completed oral care while sitting sideways at the sink to conserve energy. Pt then ambulated down the hallway and circled back to the nurses' station then to room with CGA using RW. He transferred to the recliner and was left with all needs within reach and safety belt fastened.    2nd Session 1:1 tx (43 min) Pt greeted EOB with RN present. Pt just received his pain medicine, still has a HA. He was agreeable to work on his dynamic balance during session. Pt was transported via w/c to the food court area of the hospital. Pt ambulated with device in this visually stimulating environment with CGA and vcs for keeping both feet inside of walker, cuing for AD management before sitting down as well. Note that he had 1 lateral LOB towards the Lt needing Min A to correct. Vcs for slowing pace during turns and directional changes to compensate for visual impairments. At end of session pt was escorted back to room and transferred to the recliner using RW with CGA. Left him with all needs within reach and safety belt fastened.    Therapy Documentation Precautions:  Precautions Precautions: Fall Precaution Comments: increased light sensitivity Restrictions Weight Bearing Restrictions: No Pain: Pain Assessment Pain Scale: 0-10 Pain Score: 7  Pain Type: Acute pain Pain Location: Head Pain Orientation: Right;Left;Anterior Pain Radiating Towards: left eye Pain Descriptors / Indicators: Aching;Headache Pain Frequency: Constant Pain Onset: On-going Patients Stated Pain Goal: 2 Pain Intervention(s): Medication (See eMAR) ADL: ADL Eating: Independent Where Assessed-Eating: Chair Grooming: Supervision/safety Where Assessed-Grooming: Wheelchair Upper Body Bathing: Supervision/safety Where Assessed-Upper Body Bathing:  Wheelchair Lower Body Bathing: Minimal assistance Where Assessed-Lower Body Bathing: Wheelchair Upper Body Dressing: Moderate assistance  (standing) Where Assessed-Upper Body Dressing: Standing at sink Lower Body Dressing: Minimal assistance Where Assessed-Lower Body Dressing: Wheelchair, Standing at sink, Sitting at sink Toileting: Moderate assistance Where Assessed-Toileting: Teacher, adult education: Moderate assistance Toilet Transfer Method: Freight forwarder Equipment: Raised toilet seat     Therapy/Group: Individual Therapy  Yahya Boldman A Gedalia Mcmillon 11/25/2019, 12:04 PM

## 2019-11-25 NOTE — Progress Notes (Addendum)
[place] PHYSICAL MEDICINE & REHABILITATION PROGRESS NOTE   Subjective/Complaints: Patient seen laying in bed this morning.  He states he did not sleep well overnight due to a neighboring patient.  He complains of headache this morning.  Later called by nursing regarding headache as well.  Overnight noted to have elevated diastolic blood pressures-discussed with nursing.  ROS: + Headache.  Denies CP, SOB, N/V/D  Objective:   No results found. Recent Labs    11/23/19 0511  WBC 7.1  HGB 14.6  HCT 41.9  PLT 227   Recent Labs    11/23/19 0511  NA 138  K 3.5  CL 103  CO2 24  GLUCOSE 99  BUN 10  CREATININE 0.77  CALCIUM 9.0    Intake/Output Summary (Last 24 hours) at 11/25/2019 1317 Last data filed at 11/25/2019 0951 Gross per 24 hour  Intake 1320 ml  Output --  Net 1320 ml     Physical Exam: Vital Signs Blood pressure (!) 122/93, pulse 94, temperature 98.2 F (36.8 C), resp. rate 20, height 6\' 3"  (1.905 m), weight (!) 138.1 kg, SpO2 99 %. Constitutional: No distress . Vital signs reviewed. HENT: Normocephalic.  Atraumatic. Eyes: EOMI. No discharge. Cardiovascular: No JVD. Respiratory: Normal effort.  No stridor. GI: Non-distended. Skin: Warm and dry.  Intact. Psych: Normal mood.  Normal behavior. Musc: No edema in extremities.  No tenderness in extremities. Neurologic: Alert Motor: 5/5 throughout  Assessment/Plan: 1. 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  Physiatrist is providing close team supervision and 24 hour management of active medical problems listed below.  Physiatrist and rehab team continue to assess barriers to discharge/monitor patient progress toward functional and medical goals  Care Tool:  Bathing  Bathing activity did not occur:  (N/A) Body parts bathed by patient: Right arm, Left arm, Chest, Abdomen, Front perineal area,  Buttocks, Right upper leg, Face, Right lower leg, Left lower leg, Left upper leg         Bathing assist Assist Level: Supervision/Verbal cueing     Upper Body Dressing/Undressing Upper body dressing Upper body dressing/undressing activity did not occur (including orthotics): N/A What is the patient wearing?: Pull over shirt    Upper body assist Assist Level: Set up assist    Lower Body Dressing/Undressing Lower body dressing    Lower body dressing activity did not occur: N/A What is the patient wearing?: Pants     Lower body assist Assist for lower body dressing: Supervision/Verbal cueing     Toileting Toileting    Toileting assist Assist for toileting: Contact Guard/Touching assist     Transfers Chair/bed transfer  Transfers assist     Chair/bed transfer assist level: Minimal Assistance - Patient > 75% Chair/bed transfer assistive device:   Ambulation assist      Assist level: Minimal Assistance - Patient > 75% Assistive device: Walker-rolling Max distance: 123ft   Walk 10 feet activity   Assist     Assist level: Minimal Assistance - Patient > 75% Assistive device: Walker-rolling   Walk 50 feet activity   Assist    Assist level: Minimal Assistance - Patient > 75% Assistive device: Walker-rolling    Walk 150 feet activity   Assist Walk 150 feet activity did not occur: Safety/medical concerns  Assist level: Minimal Assistance - Patient > 75% Assistive device: Walker-rolling    Walk 10 feet on  uneven surface  activity   Assist Walk 10 feet on uneven surfaces activity did not occur: Safety/medical concerns         Wheelchair     Assist Will patient use wheelchair at discharge?:  (TBD but do not anticipate)             Wheelchair 50 feet with 2 turns activity    Assist            Wheelchair 150 feet activity     Assist          Blood pressure (!) 122/93, pulse 94,  temperature 98.2 F (36.8 C), resp. rate 20, height 6\' 3"  (1.905 m), weight (!) 138.1 kg, SpO2 99 %.   Medical Problem List and Plan: 1.  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  Antithrombotics: -DVT/anticoagulation: Lovenox             -antiplatelet therapy: Aspirin 81 mg daily and Plavix 75 mg daily x3 weeks then aspirin alone 3. Pain Management: Oxycodone as needed, tramadol as needed-adjusted, Fioricet as needed  See #11 4. Mood: Lexapro 10 mg daily Klonopin 0.5 mg as needed sleep             -antipsychotic agents: N/A 5. Neuropsych: This patient is?  Fully capable of making decisions on his own behalf. 6. Skin/Wound Care: Routine skin checks 7. Fluids/Electrolytes/Nutrition: Routine in and outs.  8.  PFO.  Follow-up outpatient Dr. 01/18/2020 for consideration of closure 9.  Hypertension.  Norvasc 5 mg daily, Cozaar 50 mg daily.          Vitals:   11/25/19 0433 11/25/19 0612  BP:  (!) 122/93  Pulse: 96 94  Resp: 16 20  Temp: 98.1 F (36.7 C) 98.2 F (36.8 C)  SpO2: 100% 99%   Propanolol 20 3 times daily started on 7/10 10.  Hyperlipidemia.  Lipitor 11. Persistent severe headaches-likely vascular.    Depakote DC'd  See #9  Topamax 25 twice daily started on 7/10  No improvement on multiple medications  Discussed medications and dosage/frequency with pharmacy as well.  Will consider repeat head CT if no improvement 12.  Sleep apnea.  Continue CPAP 13.  Mild transaminitis  LFTs elevated on 7/8  Continue to monitor  LOS: 3 days A FACE TO FACE EVALUATION WAS PERFORMED  Brylynn Hanssen 9/8 11/25/2019, 1:17 PM

## 2019-11-26 MED ORDER — PROPRANOLOL HCL 20 MG PO TABS
40.0000 mg | ORAL_TABLET | Freq: Three times a day (TID) | ORAL | Status: DC
  Administered 2019-11-26 – 2019-12-06 (×27): 40 mg via ORAL
  Filled 2019-11-26 (×24): qty 2
  Filled 2019-11-26: qty 4
  Filled 2019-11-26 (×3): qty 2

## 2019-11-26 NOTE — Progress Notes (Signed)
Better night. PRN ultram given at 2112, for C/O "band-like" pain around head. Slept from 2200-0015, PRN fioricet and klonopin given at 0024. Slept from 0100-0315. At 0329, PRN ultram given for HA. Posterior neck incision with sutures and glue, OTA.Alfredo Martinez A

## 2019-11-26 NOTE — Progress Notes (Signed)
[place] PHYSICAL MEDICINE & REHABILITATION PROGRESS NOTE   Subjective/Complaints: Patient seen laying in bed this morning.  He states he slept better overnight.  He notes he still has a headache this morning, but it has improved since yesterday and he was able to sleep throughout the night.  ROS: + Headache, improving.  Denies CP, SOB, N/V/D  Objective:   No results found. No results for input(s): WBC, HGB, HCT, PLT in the last 72 hours. No results for input(s): NA, K, CL, CO2, GLUCOSE, BUN, CREATININE, CALCIUM in the last 72 hours.  Intake/Output Summary (Last 24 hours) at 11/26/2019 1457 Last data filed at 11/25/2019 2300 Gross per 24 hour  Intake 720 ml  Output --  Net 720 ml     Physical Exam: Vital Signs Blood pressure (!) 156/99, pulse 83, temperature 97.8 F (36.6 C), resp. rate 20, height 6\' 3"  (1.905 m), weight (!) 138.2 kg, SpO2 100 %. Constitutional: No distress . Vital signs reviewed. HENT: Normocephalic.  Atraumatic. Eyes: EOMI. No discharge. Cardiovascular: No JVD. Respiratory: Normal effort.  No stridor. GI: Non-distended. Skin: Warm and dry.  Intact. Psych: Normal mood.  Normal behavior. Musc: No edema in extremities.  No tenderness in extremities. Neurologic: Alert Motor: 5/5 throughout, unchanged  Assessment/Plan: 1. 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  Physiatrist is providing close team supervision and 24 hour management of active medical problems listed below.  Physiatrist and rehab team continue to assess barriers to discharge/monitor patient progress toward functional and medical goals  Care Tool:  Bathing  Bathing activity did not occur:  (N/A) Body parts bathed by patient: Right arm, Left arm, Chest, Abdomen, Front perineal area, Buttocks, Right upper leg, Face, Right lower leg, Left lower leg, Left upper leg          Bathing assist Assist Level: Supervision/Verbal cueing     Upper Body Dressing/Undressing Upper body dressing Upper body dressing/undressing activity did not occur (including orthotics): N/A What is the patient wearing?: Pull over shirt    Upper body assist Assist Level: Set up assist    Lower Body Dressing/Undressing Lower body dressing    Lower body dressing activity did not occur: N/A What is the patient wearing?: Pants     Lower body assist Assist for lower body dressing: Supervision/Verbal cueing     Toileting Toileting    Toileting assist Assist for toileting: Contact Guard/Touching assist     Transfers Chair/bed transfer  Transfers assist     Chair/bed transfer assist level: Contact Guard/Touching assist Chair/bed transfer assistive device:   Ambulation assist      Assist level: Minimal Assistance - Patient > 75% Assistive device: Walker-rolling Max distance: 165ft   Walk 10 feet activity   Assist     Assist level: Minimal Assistance - Patient > 75% Assistive device: Walker-rolling   Walk 50 feet activity   Assist    Assist level: Minimal Assistance - Patient > 75% Assistive device: Walker-rolling    Walk 150 feet activity   Assist Walk 150 feet activity did not occur: Safety/medical concerns  Assist level: Minimal Assistance - Patient > 75% Assistive device: Walker-rolling    Walk 10 feet on uneven surface  activity   Assist Walk 10 feet on uneven surfaces activity did not occur: Safety/medical concerns         Wheelchair     Assist Will  patient use wheelchair at discharge?:  (TBD but do not anticipate)             Wheelchair 50 feet with 2 turns activity    Assist            Wheelchair 150 feet activity     Assist          Blood pressure (!) 156/99, pulse 83, temperature 97.8 F (36.6 C), resp. rate 20, height 6\' 3"  (1.905 m), weight (!) 138.2 kg, SpO2 100  %.   Medical Problem List and Plan: 1.  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  Antithrombotics: -DVT/anticoagulation: Lovenox             -antiplatelet therapy: Aspirin 81 mg daily and Plavix 75 mg daily x3 weeks then aspirin alone 3. Pain Management: Oxycodone as needed, tramadol as needed-adjusted, Fioricet as needed  See #11 4. Mood: Lexapro 10 mg daily Klonopin 0.5 mg as needed sleep             -antipsychotic agents: N/A 5. Neuropsych: This patient is?  Fully capable of making decisions on his own behalf. 6. Skin/Wound Care: Routine skin checks 7. Fluids/Electrolytes/Nutrition: Routine in and outs.  8.  PFO.  Follow-up outpatient Dr. 01/18/2020 for consideration of closure 9.  Hypertension.  Norvasc 5 mg daily, Cozaar 50 mg daily.          Vitals:   11/26/19 0328 11/26/19 1442  BP: (!) 147/108 (!) 156/99  Pulse: 80 83  Resp: 16 20  Temp: 98.3 F (36.8 C) 97.8 F (36.6 C)  SpO2: 100% 100%   Propanolol 20 3 times daily started on 7/10, increased to 40 on 7/11 10.  Hyperlipidemia.  Lipitor 11. Persistent severe headaches-likely vascular.    Depakote DC'd  See #9  Topamax 25 twice daily started on 7/10  Discussed medications and dosage/frequency with pharmacy as well.  Appears to be improving, continue to titrate medications as necessary 12.  Sleep apnea.  Continue CPAP 13.  Mild transaminitis  LFTs mildly elevated on 7/8  Continue to monitor  LOS: 4 days A FACE TO FACE EVALUATION WAS PERFORMED  Miriya Cloer 9/8 11/26/2019, 2:57 PM

## 2019-11-26 NOTE — Plan of Care (Signed)
  Problem: Consults Goal: RH STROKE PATIENT EDUCATION Description: See Patient Education module for education specifics  Outcome: Progressing   Problem: RH BOWEL ELIMINATION Goal: RH STG MANAGE BOWEL WITH ASSISTANCE Description: STG Manage Bowel with min Assistance. Outcome: Progressing Goal: RH STG MANAGE BOWEL W/MEDICATION W/ASSISTANCE Description: STG Manage Bowel with Medication with mod I Assistance. Outcome: Progressing   Problem: RH BLADDER ELIMINATION Goal: RH STG MANAGE BLADDER WITH ASSISTANCE Description: STG Manage Bladder With min Assistance Outcome: Progressing   Problem: RH SKIN INTEGRITY Goal: RH STG SKIN FREE OF INFECTION/BREAKDOWN Description: Pt skin will be free of infection/breakdown with min assist Outcome: Progressing   Problem: RH SAFETY Goal: RH STG ADHERE TO SAFETY PRECAUTIONS W/ASSISTANCE/DEVICE Description: STG Adhere to Safety Precautions With cues/reminders Assistance/Device. Outcome: Progressing   Problem: RH COGNITION-NURSING Goal: RH STG ANTICIPATES NEEDS/CALLS FOR ASSIST W/ASSIST/CUES Description: STG Anticipates Needs/Calls for Assist With cues/reminders Assistance/Cues. Outcome: Progressing   Problem: RH PAIN MANAGEMENT Goal: RH STG PAIN MANAGED AT OR BELOW PT'S PAIN GOAL Description: Pt will be able to verbalize pain using scale and report if medication is effective at or below level 4 Outcome: Progressing   Problem: RH KNOWLEDGE DEFICIT Goal: RH STG INCREASE KNOWLEDGE OF HYPERTENSION Description: Patient will be able to manage HTN with diet, medications using handouts and educational materials independently Outcome: Progressing Goal: RH STG INCREASE KNOWLEDGE OF STROKE PROPHYLAXIS Description: Patient will be able to manage secondary stroke prevention with diet, medications using handouts and educational materials independently Outcome: Progressing   Problem: RH KNOWLEDGE DEFICIT Goal: RH STG INCREASE KNOWLEGDE OF  HYPERLIPIDEMIA Description: Patient will be able to manage HLD with diet, medications using handouts and educational materials independently Outcome: Progressing   Problem: RH Vision Goal: RH LTG Vision (Specify) Outcome: Progressing   

## 2019-11-27 ENCOUNTER — Inpatient Hospital Stay (HOSPITAL_COMMUNITY): Payer: 59 | Admitting: Occupational Therapy

## 2019-11-27 ENCOUNTER — Inpatient Hospital Stay (HOSPITAL_COMMUNITY): Payer: 59

## 2019-11-27 ENCOUNTER — Inpatient Hospital Stay (HOSPITAL_COMMUNITY): Payer: 59 | Admitting: Physical Therapy

## 2019-11-27 IMAGING — CT CT HEAD W/O CM
4 series · 15 of 47 positions shown, 17 images · non-contrast
Comparison: [DATE] and earlier.

CLINICAL DATA: 48-year-old male status post suboccipital
craniectomy for decompression of left PICA infarct.

EXAM:
CT HEAD WITHOUT CONTRAST
TECHNIQUE: Contiguous axial images were obtained from the base of the skull
through the vertex without intravenous contrast.

[Series 3: head without · axial · non-contrast · 0.46mm/px · z∈[+1135,+1270]mm · 7 of 37 slices shown, 9 images]
[im 5/37  brain]
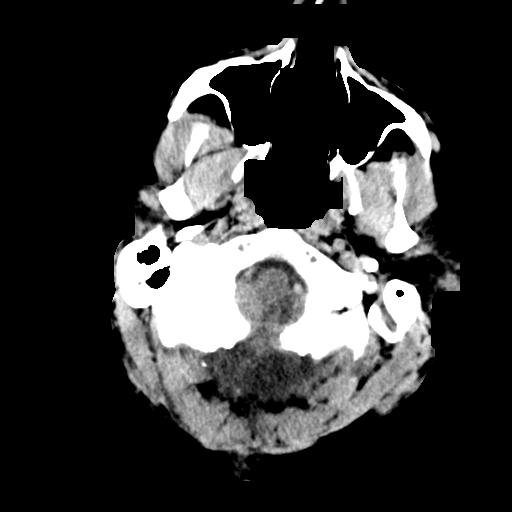
[im 5/37  bone]
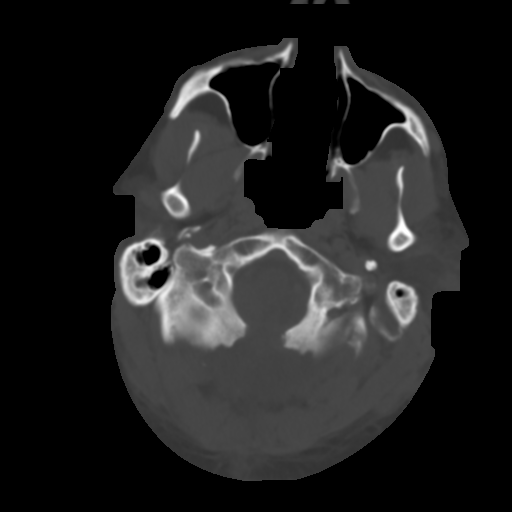
[im 10/37  brain]
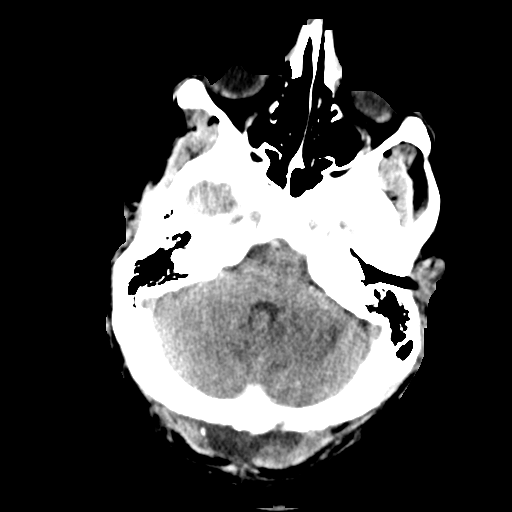
[im 14/37  brain]
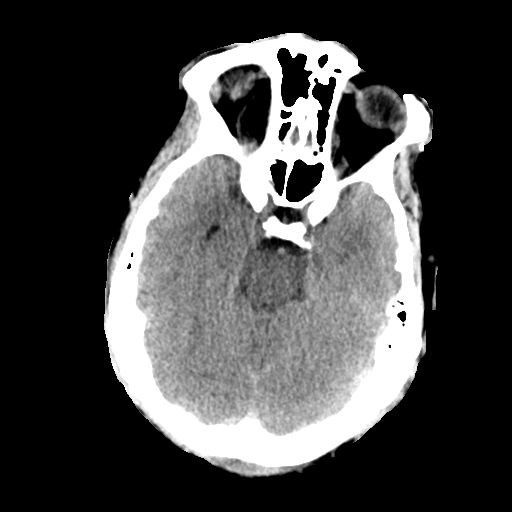
[im 19/37  brain]
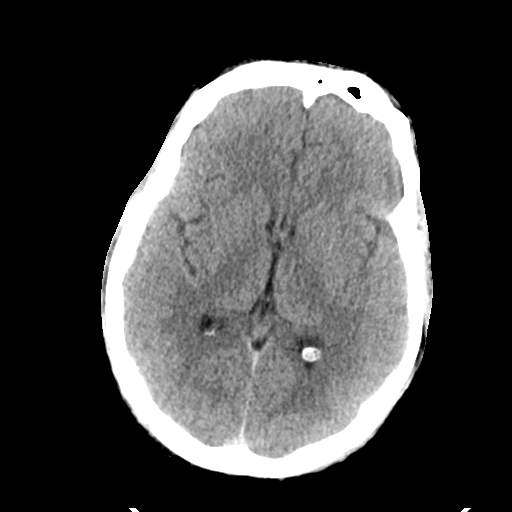
[im 23/37  brain]
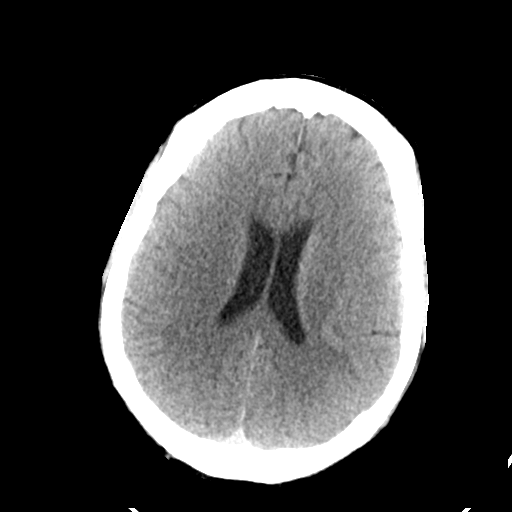
[im 23/37  bone]
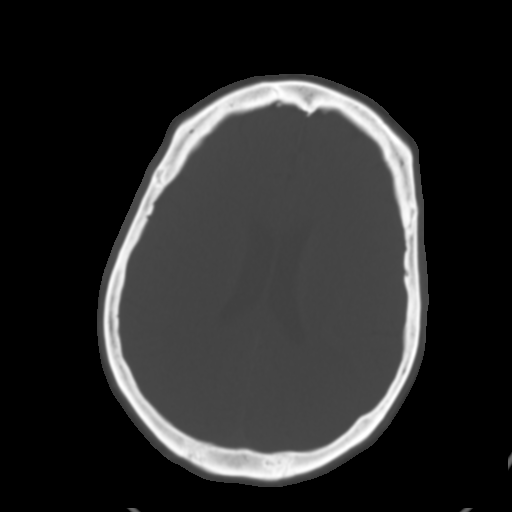
[im 28/37  brain]
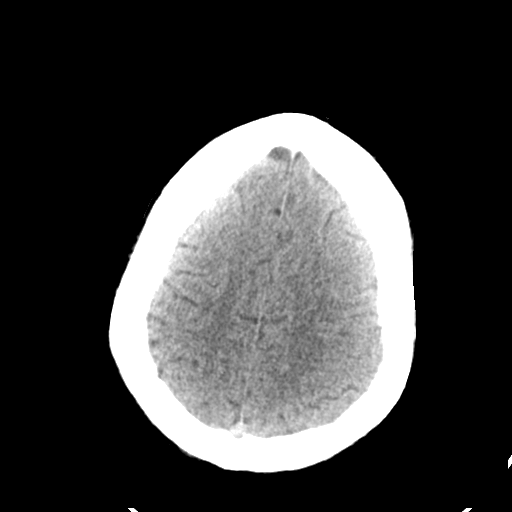
[im 32/37  brain]
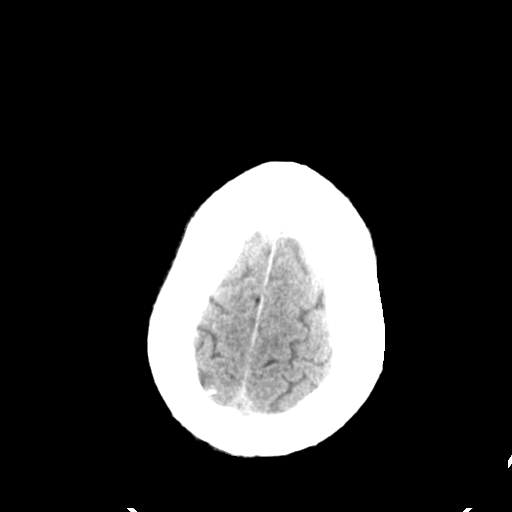

[Series 4: head bone · axial · 0.46mm/px · z∈[+1133,+1151]mm · 2 of 93 slices shown]
[im 10/93  bone]
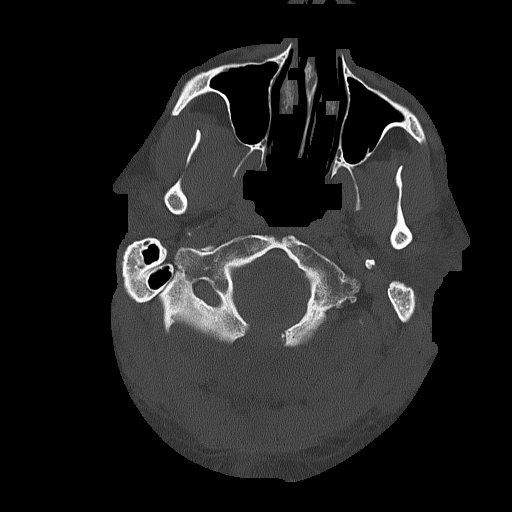
[im 19/93  bone]
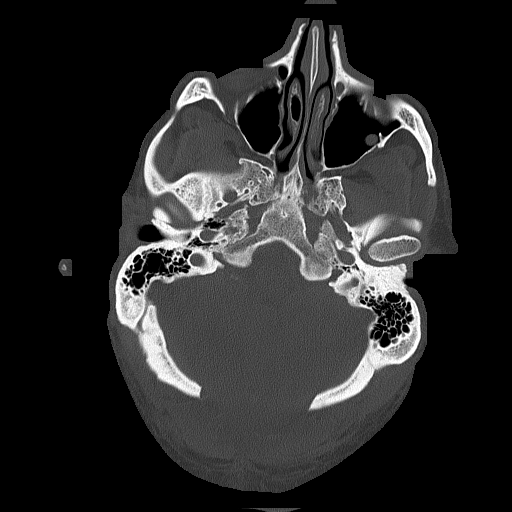

[Series 5: head without cor · coronal · non-contrast · 0.34mm/px · 3 of 71 slices shown]
[im 24/71  brain]
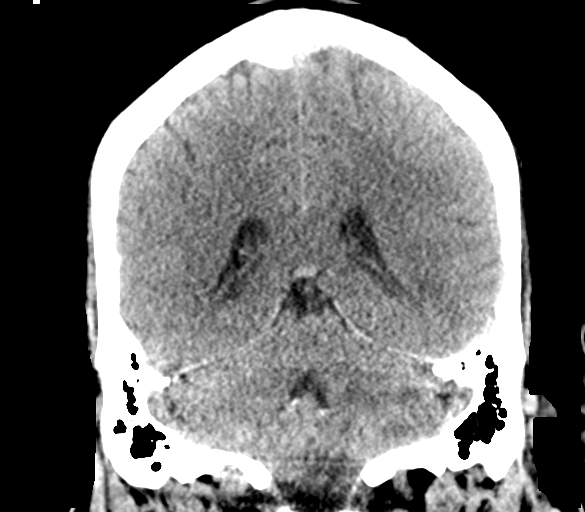
[im 32/71  brain]
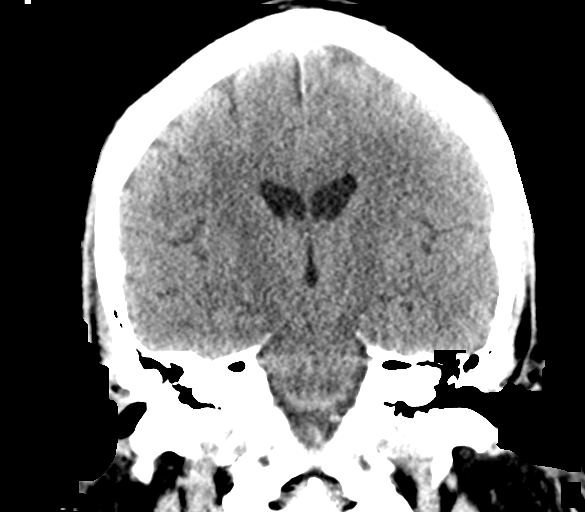
[im 39/71  brain]
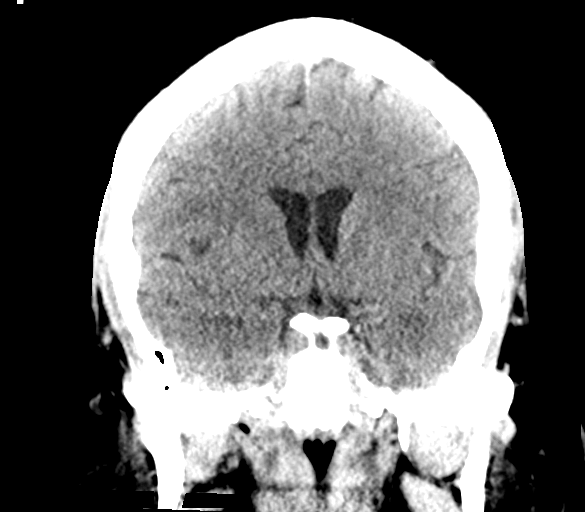

[Series 6: head without sag · sagittal · non-contrast · 0.34mm/px · 3 of 67 slices shown]
[im 23/67  brain]
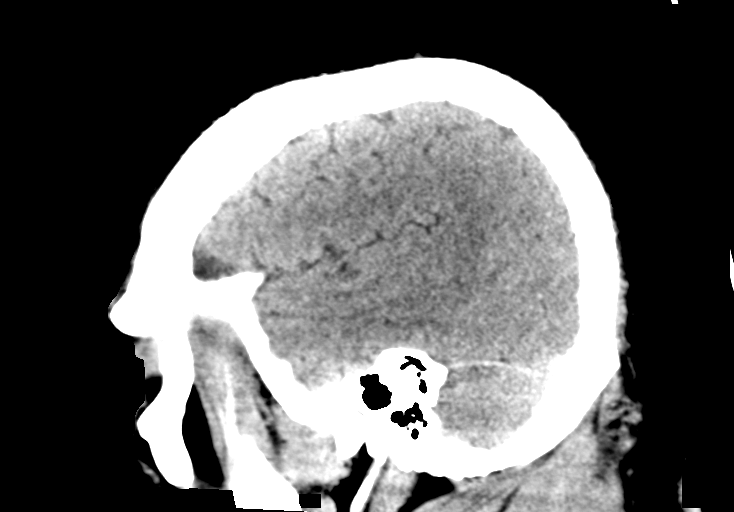
[im 34/67  brain]
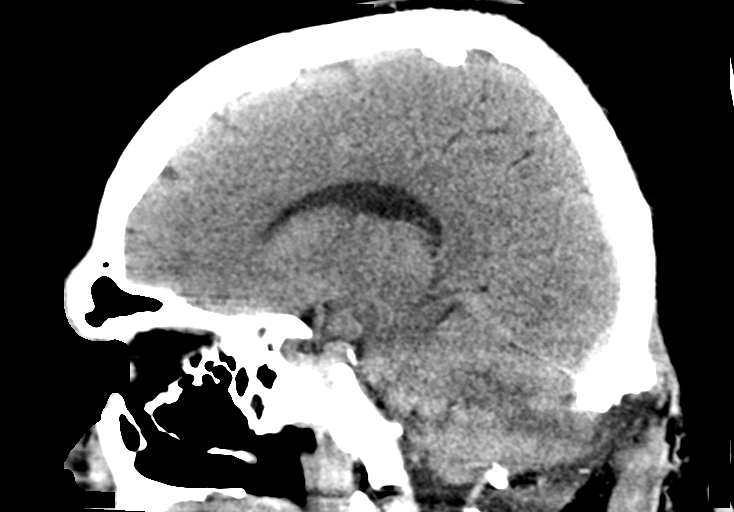
[im 45/67  brain]
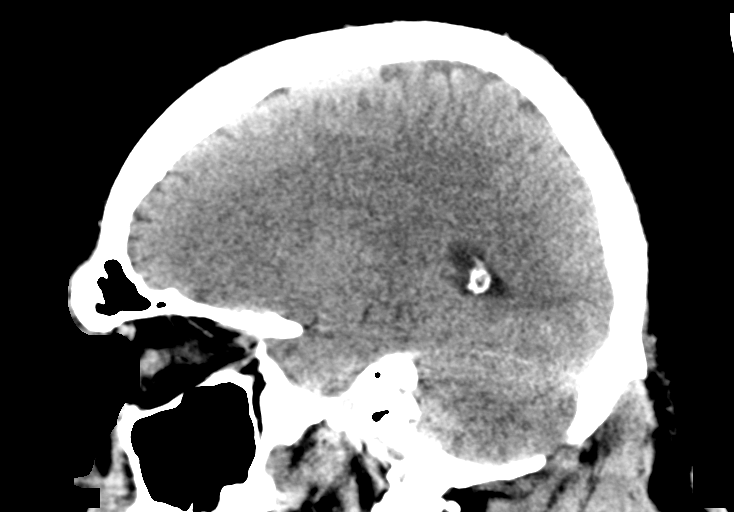

[15 of 47 positions shown; findings below may reference images not displayed]

FINDINGS: Brain: Largely resolved inferior cerebellar hemorrhage following
suboccipital decompression. There is residual bilateral inferior
cerebellar edema and/or developing encephalomalacia, greater on the
left. Improved patency of the cisterna magna and the 4th ventricle
since [DATE].

Normalized lateral and 3rd ventricle size since [DATE].
Supratentorial gray-white matter differentiation remains normal. No
new intracranial blood products.

Vascular: Stable noncontrast CT appearance of the large vessels.

Skull: Stable suboccipital craniectomy.

Sinuses/Orbits: Visualized paranasal sinuses and mastoids are stable
and well pneumatized.

Other: Suboccipital postoperative changes with trace soft tissue
fluid but resolved soft tissue gas. No new orbit or scalp soft
tissue finding.
IMPRESSION: 1. Decreased posterior fossa mass effect, resolved mild
ventriculomegaly, and largely resolved inferior cerebellar
hemorrhage since [DATE]. There is residual bilateral inferior
cerebellar edema and/or developing encephalomalacia.

2. No new intracranial abnormality.

## 2019-11-27 MED ORDER — AMLODIPINE BESYLATE 10 MG PO TABS
10.0000 mg | ORAL_TABLET | Freq: Every day | ORAL | Status: DC
  Administered 2019-11-27 – 2019-12-05 (×9): 10 mg via ORAL
  Filled 2019-11-27 (×9): qty 1

## 2019-11-27 NOTE — Progress Notes (Signed)
[place] PHYSICAL MEDICINE & REHABILITATION PROGRESS NOTE   Subjective/Complaints: [name] has no complaints overnight.  Denies constipation, insomnia, pain.  Headache is improving.   ROS:  Denies CP, SOB, N/V/D  Objective:   No results found. No results for input(s): WBC, HGB, HCT, PLT in the last 72 hours. No results for input(s): NA, K, CL, CO2, GLUCOSE, BUN, CREATININE, CALCIUM in the last 72 hours.  Intake/Output Summary (Last 24 hours) at 11/27/2019 1000 Last data filed at 11/27/2019 0900 Gross per 24 hour  Intake 1080 ml  Output --  Net 1080 ml     Physical Exam: Vital Signs Blood pressure (!) 140/100, pulse 78, temperature (!) 97.4 F (36.3 C), resp. rate 18, height 6\' 3"  (1.905 m), weight (!) 136.8 kg, SpO2 99 %.  General: Alert and oriented x 3, No apparent distress HEENT: Head is normocephalic, atraumatic, PERRLA, EOMI, sclera anicteric, oral mucosa pink and moist, dentition intact, ext ear canals clear,  Neck: Supple without JVD or lymphadenopathy Heart: Reg rate and rhythm. No murmurs rubs or gallops Chest: CTA bilaterally without wheezes, rales, or rhonchi; no distress Abdomen: Soft, non-tender, non-distended, bowel sounds positive. Extremities: No clubbing, cyanosis, or edema. Pulses are 2+ Skin: Clean and intact without signs of breakdown Psych: Normal mood.  Normal behavior. Musc: No edema in extremities.  No tenderness in extremities. Neurologic: Alert Motor: 5/5 throughout, unchanged  Assessment/Plan: 1. 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  Physiatrist is providing close team supervision and 24 hour management of active medical problems listed below.  Physiatrist and rehab team continue to assess barriers to discharge/monitor patient progress toward functional and medical goals  Care Tool:  Bathing  Bathing activity did not  occur:  (N/A) Body parts bathed by patient: Right arm, Left arm, Chest, Abdomen, Front perineal area, Buttocks, Right upper leg, Face, Right lower leg, Left lower leg, Left upper leg         Bathing assist Assist Level: Supervision/Verbal cueing     Upper Body Dressing/Undressing Upper body dressing Upper body dressing/undressing activity did not occur (including orthotics): N/A What is the patient wearing?: Pull over shirt    Upper body assist Assist Level: Set up assist    Lower Body Dressing/Undressing Lower body dressing    Lower body dressing activity did not occur: N/A What is the patient wearing?: Pants     Lower body assist Assist for lower body dressing: Supervision/Verbal cueing     Toileting Toileting    Toileting assist Assist for toileting: Contact Guard/Touching assist     Transfers Chair/bed transfer  Transfers assist     Chair/bed transfer assist level: Contact Guard/Touching assist Chair/bed transfer assistive device:   Ambulation assist      Assist level: Minimal Assistance - Patient > 75% Assistive device: Walker-rolling Max distance: 137ft   Walk 10 feet activity   Assist     Assist level: Minimal Assistance - Patient > 75% Assistive device: Walker-rolling   Walk 50 feet activity   Assist    Assist level: Minimal Assistance - Patient > 75% Assistive device: Walker-rolling    Walk 150 feet activity   Assist Walk 150 feet activity did not occur: Safety/medical concerns  Assist level: Minimal Assistance - Patient > 75% Assistive device: Walker-rolling    Walk 10 feet on uneven surface  activity   Assist Walk 10 feet  on uneven surfaces activity did not occur: Safety/medical concerns         Wheelchair     Assist Will patient use wheelchair at discharge?:  (TBD but do not anticipate)             Wheelchair 50 feet with 2 turns activity    Assist             Wheelchair 150 feet activity     Assist          Blood pressure (!) 140/100, pulse 78, temperature (!) 97.4 F (36.3 C), resp. rate 18, height 6\' 3"  (1.905 m), weight (!) 136.8 kg, SpO2 99 %.   Medical Problem List and Plan: 1.  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  Antithrombotics: -DVT/anticoagulation: Lovenox- discontinued: ambulating >175 feet             -antiplatelet therapy: Aspirin 81 mg daily and Plavix 75 mg daily x3 weeks then aspirin alone 3. Pain Management: Oxycodone as needed, tramadol as needed-adjusted, Fioricet as needed  See #11  7/12 well controlled.  4. Mood: Lexapro 10 mg daily Klonopin 0.5 mg as needed sleep             -antipsychotic agents: N/A 5. Neuropsych: This patient is?  Fully capable of making decisions on his own behalf. 6. Skin/Wound Care: Routine skin checks 7. Fluids/Electrolytes/Nutrition: Routine in and outs.  8.  PFO.  Follow-up outpatient Dr. 9/12 for consideration of closure 9.  Hypertension.  Norvasc 5 mg daily, Cozaar 50 mg daily.          Vitals:   11/26/19 1944 11/27/19 0354  BP: (!) 149/100 (!) 140/100  Pulse: 85 78  Resp: 18 18  Temp: 98.9 F (37.2 C) (!) 97.4 F (36.3 C)  SpO2: 100% 99%   Propanolol 20 3 times daily started on 7/10, increased to 40 on 7/11  7/12: Continues to be elevated: increase Norvasc to 10mg  10.  Hyperlipidemia.  Lipitor 11. Persistent severe headaches-likely vascular.    Depakote DC'd  See #9  Topamax 25 twice daily started on 7/10  Discussed medications and dosage/frequency with pharmacy as well.  Appears to be improving, continue to titrate medications as necessary 12.  Sleep apnea.  Continue CPAP 13.  Mild transaminitis  LFTs mildly elevated on  7/8  Continue to monitor  LOS: 5 days A FACE TO FACE EVALUATION WAS PERFORMED  Felina Tello P Ebba Goll 11/27/2019, 10:00 AM

## 2019-11-27 NOTE — Progress Notes (Signed)
Occupational Therapy Session Note  Patient Details  Name: [name] MRN: [id number] Date of Birth: 02-19-1971  Today's Date: 11/27/2019 OT Individual Time: 3536-1443 OT Individual Time Calculation (min): 70 min    Short Term Goals: Week 1:  OT Short Term Goal 1 (Week 1): Pt will complete tub shower transfers with min guard assist using the tub bench and RW for support. OT Short Term Goal 2 (Week 1): Pt will complete LB selfcare with min guard assist sit to stand for two consecutive session. OT Short Term Goal 3 (Week 1): Pt will complete convergence and tracking exercises with independence following handout. OT Short Term Goal 4 (Week 1): Pt will complete toilet transfers with min guard assist using the RW for support.  Skilled Therapeutic Interventions/Progress Updates:    Pt in recliner to start session with washcloth over his head secondary to headache pain.  He reported having pain medicine about one hour ago prior to this session.  Next, he was able to ambulate to the bathroom with use of the RW and min guard assist to complete toileting tasks.  After transferring back out with the RW and standing at the sink to wash his hands, he then ambulated down to the ortho gym with min guard and use of the RW for support.  He had to stop two times and stand secondary to him reporting that his brain and his feet weren't "working together".  Once in the gym, had him sit on the therapy mat and work on convergence exercises with use of the [employer].  He was able to converge his eyes at around three feet or greater, but is unable to fuse them together at closer distances.  Noted the left eye exhibits greater convergence while the right eye maintains fixed gaze.  He was able to work on this for intervals of 3-4 mins before needing a break.  He reported increased eye fatigue as well as some increased dizziness.  Next, had him standing and complete 2 intervals of 1 minute using the [employer].  First  interval he maintained wide BOS with little head movement while scanning and locating the lights.  He was able to complete in an average of 1.7 seconds per light.  With the 2nd interval he stood with his feet closer together and incorporated more head and eye movement with an average time of 1.38 seconds between each light.  After this interval he reported more dizziness and slight nausea.  Finished session with ambulation back to the room using the RW and min guard assist.  Finished session with pt laying in the bed to rest with cold washcloth given to him for his headache.  Call button and phone in reach with bed alarm in place.     Therapy Documentation Precautions:  Precautions Precautions: Fall Precaution Comments: increased light sensitivity Restrictions Weight Bearing Restrictions: No  Pain: Pain Assessment Pain Scale: Faces Pain Score: 0-No pain Faces Pain Scale: Hurts even more Pain Type: Acute pain Pain Location: Head Pain Orientation: Left;Anterior Pain Descriptors / Indicators: Aching;Headache Pain Onset: With Activity Pain Intervention(s): Medication (See eMAR);Repositioned ADL: See Care Tool Section for some details of mobility and selfcare  Therapy/Group: Individual Therapy  Wynter Grave OTR/L 11/27/2019, 3:48 PM

## 2019-11-27 NOTE — Progress Notes (Signed)
Physical Therapy Session Note  Patient Details  Name: [name] MRN: [id number] Date of Birth: 25-Jun-1970  Today's Date: 11/27/2019 PT Individual Time: 0806-0902 PT Individual Time Calculation (min): 56 min   Short Term Goals: Week 1:  PT Short Term Goal 1 (Week 1): Pt will perform sit<>stands with CGA PT Short Term Goal 2 (Week 1): Pt will perform bed<>chair transfers with CGA PT Short Term Goal 3 (Week 1): Pt will ambulate at least 159ft using LRAD with CGA PT Short Term Goal 4 (Week 1): Pt will ascend/descend 4 steps using HRs with min assist  Skilled Therapeutic Interventions/Progress Updates:   Pt received sitting in recliner eating breakfast and agreeable to therapy session. Required a few minutes to complete meal while pt reporting he had a "horrible" day yesterday and that he "sulked around" because he didn't feel well. Sit<>stands using RW with CGA for safety/steadying during session. Gait training ~168ft to main gym using RW with CGA for steadying - demonstrates slight increased postural sway compared to on Saturday with this therapist but not enough to need increased assistance as pt able to use RW to balance himself.  Gait training at hallway rail ~70ft down/back >20times without UE support - initially demonstrated step-to pattern with very guarded trunk and no arm swing but with increased distance demonstrated reciprocal stepping pattern and increasing arm swing - progressed to sudden stop/starts with pt demonstrating increased postural sway upon stopping but able to maintain upright with CGA/min assist for steadying/balance - progressed to R/L lateral head turns with pt turning his head 1x per 57ft walk and after 3 head turns pt reports significant dizziness/unstable feeling requiring therapeutic seated rest break - during R head turn had significant L lateral LOB using the wall and handrail for recovery otherwise able to turn head L with minor LOB and recover with min assist  from therapist After seated therapeutic rest break. Ascended/descended 16steps using B HRs with cuing for reciprocal stepping pattern and CGA/min assist for steadying - pt moves very slow and guarded when on steps - pt states "that wore me out." Gait training ~126ft back to room using RW with CGA for steadying. Left sitting in recliner with seat belt alarm on and needs in reach.   Therapy Documentation Precautions:  Precautions Precautions: Fall Precaution Comments: increased light sensitivity Restrictions Weight Bearing Restrictions: No  Pain: Continues to report headache pain - RN premedicated for therapy session - provided rest breaks throughout session for pain management.   Therapy/Group: Individual Therapy  Ginny Forth, PT, DPT, CSRS  11/27/2019, 7:50 AM

## 2019-11-27 NOTE — Progress Notes (Signed)
Pt asleep, not wearing CPAP when RT came by for rounds. RT woke pt, and asked if he wanted to put CPAP on. Pt states he is okay for now. Advised pt to notify for RT if any further assistance is needed.

## 2019-11-27 NOTE — Progress Notes (Signed)
Occupational Therapy Session Note  Patient Details  Name: [name] MRN: [id number] Date of Birth: Dec 15, 1970  Today's Date: 11/27/2019 OT Individual Time: 1000-1100 OT Individual Time Calculation (min): 60 min    Short Term Goals: Week 1:  OT Short Term Goal 1 (Week 1): Pt will complete tub shower transfers with min guard assist using the tub bench and RW for support. OT Short Term Goal 2 (Week 1): Pt will complete LB selfcare with min guard assist sit to stand for two consecutive session. OT Short Term Goal 3 (Week 1): Pt will complete convergence and tracking exercises with independence following handout. OT Short Term Goal 4 (Week 1): Pt will complete toilet transfers with min guard assist using the RW for support.  Skilled Therapeutic Interventions/Progress Updates:    Patient seated in recliner, asleep but easily aroused.  He c/o HA left lateral aspect of head that is constant.  With increased time he is able to stand from recliner, ambulate to bathroom using RW with CGA/CS, complete toileting with CS, transfer to shower bench CGA. Complete shower CS in seated position with CGA when in stance.  Dressing completed seated on shower bench with set up/CS.  He ambulated back to recliner CS at slow rate.  Completed oral care and applied deodrant with set up.  He remained seated in recliner at close of session, seat belt alarm set and call bell in reach.    Therapy Documentation Precautions:  Precautions Precautions: Fall Precaution Comments: increased light sensitivity Restrictions Weight Bearing Restrictions: No  Therapy/Group: Individual Therapy  Barrie Lyme 11/27/2019, 7:37 AM

## 2019-11-27 NOTE — Progress Notes (Signed)
Notified Dr. Maurice Small of pt having constant headache that's not improving much after medications given. Per MD, will place new order for head CT and follow up with pt. Notified wife due to concerns and was very thankful for reaching out to MD and letting her be aware of follow up. Pt resting in bed with cold washcloth on head for HA. Will cont to monitor.   Ross Ludwig, RN

## 2019-11-28 ENCOUNTER — Inpatient Hospital Stay (HOSPITAL_COMMUNITY): Payer: 59 | Admitting: Occupational Therapy

## 2019-11-28 ENCOUNTER — Inpatient Hospital Stay (HOSPITAL_COMMUNITY): Payer: 59 | Admitting: Physical Therapy

## 2019-11-28 LAB — CULTURE, BLOOD (ROUTINE X 2)
Culture: NO GROWTH
Culture: NO GROWTH

## 2019-11-28 MED ORDER — TOPIRAMATE 25 MG PO TABS: 50.0000 mg | ORAL_TABLET | Freq: Two times a day (BID) | ORAL | Status: DC

## 2019-11-28 MED ORDER — MELATONIN 3 MG PO TABS
3.0000 mg | ORAL_TABLET | Freq: Every day | ORAL | Status: DC
  Administered 2019-11-28 – 2019-12-05 (×8): 3 mg via ORAL
  Filled 2019-11-28 (×8): qty 1

## 2019-11-28 MED ORDER — DIAZEPAM 2 MG PO TABS
2.0000 mg | ORAL_TABLET | Freq: Every day | ORAL | Status: DC
  Administered 2019-11-28 – 2019-12-05 (×8): 2 mg via ORAL
  Filled 2019-11-28 (×8): qty 1

## 2019-11-28 MED ORDER — TOPIRAMATE 25 MG PO TABS
50.0000 mg | ORAL_TABLET | Freq: Two times a day (BID) | ORAL | Status: DC
  Administered 2019-11-28 – 2019-11-29 (×4): 50 mg via ORAL
  Filled 2019-11-28 (×5): qty 2

## 2019-11-28 MED ORDER — LOSARTAN POTASSIUM 50 MG PO TABS
75.0000 mg | ORAL_TABLET | Freq: Every day | ORAL | Status: DC
  Administered 2019-11-29 – 2019-12-06 (×8): 75 mg via ORAL
  Filled 2019-11-28 (×8): qty 2

## 2019-11-28 NOTE — Progress Notes (Addendum)
[place] PHYSICAL MEDICINE & REHABILITATION PROGRESS NOTE   Subjective/Complaints: Has severe headache this morning. Wife is at bedside. He says the migraine medication helps- I believe he is referring to Fioricet. Last received last night. Requested that RN give him some now and advised patient that he may request q6H as needed.   ROS: Denies CP, SOB, N/V/D  Objective:   CT HEAD WO CONTRAST  Result Date: 11/27/2019 CLINICAL DATA:  49 year old male status post suboccipital craniectomy for decompression of left PICA infarct. EXAM: CT HEAD WITHOUT CONTRAST TECHNIQUE: Contiguous axial images were obtained from the base of the skull through the vertex without intravenous contrast. COMPARISON:  11/17/2019 and earlier. FINDINGS: Brain: Largely resolved inferior cerebellar hemorrhage following suboccipital decompression. There is residual bilateral inferior cerebellar edema and/or developing encephalomalacia, greater on the left. Improved patency of the cisterna magna and the 4th ventricle since 11/17/2019. Normalized lateral and 3rd ventricle size since 11/17/2019. Supratentorial gray-white matter differentiation remains normal. No new intracranial blood products. Vascular: Stable noncontrast CT appearance of the large vessels. Skull: Stable suboccipital craniectomy. Sinuses/Orbits: Visualized paranasal sinuses and mastoids are stable and well pneumatized. Other: Suboccipital postoperative changes with trace soft tissue fluid but resolved soft tissue gas. No new orbit or scalp soft tissue finding. IMPRESSION: 1. Decreased posterior fossa mass effect, resolved mild ventriculomegaly, and largely resolved inferior cerebellar hemorrhage since 11/17/2019. There is residual bilateral inferior cerebellar edema and/or developing encephalomalacia. 2. No new intracranial abnormality. Electronically Signed   By: Odessa Fleming M.D.   On: 11/27/2019 21:19   No results for input(s): WBC, HGB, HCT, PLT in the last 72  hours. No results for input(s): NA, K, CL, CO2, GLUCOSE, BUN, CREATININE, CALCIUM in the last 72 hours.  Intake/Output Summary (Last 24 hours) at 11/28/2019 1137 Last data filed at 11/28/2019 0820 Gross per 24 hour  Intake 660 ml  Output --  Net 660 ml     Physical Exam: Vital Signs Blood pressure (!) 147/112, pulse 85, temperature 98.1 F (36.7 C), resp. rate 14, height 6\' 3"  (1.905 m), weight (!) 136.8 kg, SpO2 100 %.  General: Alert and oriented x 3, No apparent distress HEENT: Head is normocephalic, atraumatic, PERRLA, EOMI, sclera anicteric, oral mucosa pink and moist, dentition intact, ext ear canals clear,  Neck: Supple without JVD or lymphadenopathy Heart: Reg rate and rhythm. No murmurs rubs or gallops Chest: CTA bilaterally without wheezes, rales, or rhonchi; no distress Abdomen: Soft, non-tender, non-distended, bowel sounds positive. Extremities: No clubbing, cyanosis, or edema. Pulses are 2+ Skin: Clean and intact without signs of breakdown Psych: Normal mood.  Normal behavior. Musc: No edema in extremities.  No tenderness in extremities. Neurologic: Alert Motor: 5/5 throughout, unchanged  Assessment/Plan: 1. 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  Physiatrist is providing close team supervision and 24 hour management of active medical problems listed below.  Physiatrist and rehab team continue to assess barriers to discharge/monitor patient progress toward functional and medical goals  Care Tool:  Bathing  Bathing activity did not occur:  (N/A) Body parts bathed by patient: Right arm, Left arm, Chest, Abdomen, Front perineal area, Buttocks, Right upper leg, Face, Right lower leg, Left lower leg, Left upper leg         Bathing assist Assist Level: Supervision/Verbal cueing     Upper Body Dressing/Undressing Upper body dressing   What is the  patient wearing?: Pull over  shirt    Upper body assist Assist Level: Set up assist    Lower Body Dressing/Undressing Lower body dressing      What is the patient wearing?: Pants     Lower body assist Assist for lower body dressing: Supervision/Verbal cueing     Toileting Toileting    Toileting assist Assist for toileting: Contact Guard/Touching assist     Transfers Chair/bed transfer  Transfers assist     Chair/bed transfer assist level: Contact Guard/Touching assist Chair/bed transfer assistive device: Geologist, engineering   Ambulation assist      Assist level: Contact Guard/Touching assist Assistive device: Walker-rolling Max distance: 169ft   Walk 10 feet activity   Assist     Assist level: Contact Guard/Touching assist Assistive device: Walker-rolling   Walk 50 feet activity   Assist    Assist level: Contact Guard/Touching assist Assistive device: Walker-rolling    Walk 150 feet activity   Assist Walk 150 feet activity did not occur: Safety/medical concerns  Assist level: Contact Guard/Touching assist Assistive device: Walker-rolling    Walk 10 feet on uneven surface  activity   Assist Walk 10 feet on uneven surfaces activity did not occur: Safety/medical concerns         Wheelchair     Assist Will patient use wheelchair at discharge?: No (TBD but do not anticipate)             Wheelchair 50 feet with 2 turns activity    Assist            Wheelchair 150 feet activity     Assist          Blood pressure (!) 147/112, pulse 85, temperature 98.1 F (36.7 C), resp. rate 14, height 6\' 3"  (1.905 m), weight (!) 136.8 kg, SpO2 100 %.   Medical Problem List and Plan: 1.  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 Shows hemorrhage and edema are resolving.  2.  Antithrombotics: -DVT/anticoagulation: Lovenox- discontinued: ambulating >175 feet             -antiplatelet therapy: Aspirin 81 mg daily and Plavix 75 mg daily x3 weeks then aspirin alone 3. Pain Management: Oxycodone as needed, tramadol as needed-adjusted, Fioricet as needed  7/13: Oxycontin stopped. Wife said opioids have worsened his headaches in past and she believes this is happening now. See #11 4. Mood: Lexapro 10 mg daily Klonopin 0.5 mg as needed sleep             -antipsychotic agents: N/A 5. Neuropsych: This patient is?  Fully capable of making decisions on his own behalf. 6. Skin/Wound Care: Routine skin checks 7. Fluids/Electrolytes/Nutrition: Routine in and outs.  8.  PFO.  Follow-up outpatient Dr. 8/13 for consideration of closure 9.  Hypertension.  Norvasc 5 mg daily, Cozaar 50 mg daily.          Vitals:   11/27/19 1950 11/28/19 0502  BP: (!) 156/85 (!) 147/112  Pulse: 97 85  Resp:    Temp: 98.1 F (36.7 C) 98.1 F (36.7 C)  SpO2: 99% 100%   Propanolol 20 3 times daily started on 7/10, increased to 40 on 7/11  7/12: Continues to be elevated: increase Norvasc to 10mg   7/13: remains elevated. Kidney function  normal. Increase Cozaar to 75mg  daily 10.  Hyperlipidemia.  Lipitor 11. Persistent severe headaches-likely vascular.    Depakote DC'd  See #9  7/13: headache is severe this morning. Increase Topamax to 50mg  BID. Advised that he may request his Fioricet q6H as needed and asked RN to give him a dose this morning. He finds this does help. 12.  Sleep apnea.  Continue CPAP 13.  Mild transaminitis  LFTs mildly elevated on 7/8  Continue to monitor 14. Insomnia: melatonin 3mg  HS  LOS: 6 days A FACE TO FACE EVALUATION WAS PERFORMED  8/13 Kalene Cutler 11/28/2019, 11:37 AM

## 2019-11-28 NOTE — Plan of Care (Signed)
  Problem: Consults Goal: RH STROKE PATIENT EDUCATION Description: See Patient Education module for education specifics  Outcome: Progressing   Problem: RH BOWEL ELIMINATION Goal: RH STG MANAGE BOWEL WITH ASSISTANCE Description: STG Manage Bowel with min Assistance. Outcome: Progressing Goal: RH STG MANAGE BOWEL W/MEDICATION W/ASSISTANCE Description: STG Manage Bowel with Medication with mod I Assistance. Outcome: Progressing   Problem: RH BLADDER ELIMINATION Goal: RH STG MANAGE BLADDER WITH ASSISTANCE Description: STG Manage Bladder With min Assistance Outcome: Progressing   Problem: RH SKIN INTEGRITY Goal: RH STG SKIN FREE OF INFECTION/BREAKDOWN Description: Pt skin will be free of infection/breakdown with min assist Outcome: Progressing   Problem: RH SAFETY Goal: RH STG ADHERE TO SAFETY PRECAUTIONS W/ASSISTANCE/DEVICE Description: STG Adhere to Safety Precautions With cues/reminders Assistance/Device. Outcome: Progressing   Problem: RH COGNITION-NURSING Goal: RH STG ANTICIPATES NEEDS/CALLS FOR ASSIST W/ASSIST/CUES Description: STG Anticipates Needs/Calls for Assist With cues/reminders Assistance/Cues. Outcome: Progressing   Problem: RH PAIN MANAGEMENT Goal: RH STG PAIN MANAGED AT OR BELOW PT'S PAIN GOAL Description: Pt will be able to verbalize pain using scale and report if medication is effective at or below level 4 Outcome: Progressing   Problem: RH KNOWLEDGE DEFICIT Goal: RH STG INCREASE KNOWLEDGE OF HYPERTENSION Description: Patient will be able to manage HTN with diet, medications using handouts and educational materials independently Outcome: Progressing Goal: RH STG INCREASE KNOWLEDGE OF STROKE PROPHYLAXIS Description: Patient will be able to manage secondary stroke prevention with diet, medications using handouts and educational materials independently Outcome: Progressing   Problem: RH KNOWLEDGE DEFICIT Goal: RH STG INCREASE KNOWLEGDE OF  HYPERLIPIDEMIA Description: Patient will be able to manage HLD with diet, medications using handouts and educational materials independently Outcome: Progressing   Problem: RH Vision Goal: RH LTG Vision (Specify) Outcome: Progressing

## 2019-11-28 NOTE — Progress Notes (Signed)
Occupational Therapy Session Note  Patient Details  Name: [name] MRN: [id number] Date of Birth: 06-15-1970  Today's Date: 11/28/2019 OT Individual Time: 1093-2355 OT Individual Time Calculation (min): 59 min    Short Term Goals: Week 1:  OT Short Term Goal 1 (Week 1): Pt will complete tub shower transfers with min guard assist using the tub bench and RW for support. OT Short Term Goal 2 (Week 1): Pt will complete LB selfcare with min guard assist sit to stand for two consecutive session. OT Short Term Goal 3 (Week 1): Pt will complete convergence and tracking exercises with independence following handout. OT Short Term Goal 4 (Week 1): Pt will complete toilet transfers with min guard assist using the RW for support.  Skilled Therapeutic Interventions/Progress Updates:    Session 1: [phone]) Pt in recliner with spouse present waiting to talk to the MD.  He requested to complete shower and toileting this am.  He still reports increased headache at 5/10 at this time.  He was able to ambulate to the toilet with min guard assist using the RW for support.  He managed all clothing at min guard as well with completed removal in preparation for transfer to the tub bench.  He used the grab bars and min guard to transfer to the tub bench.  He was able to bathe sit to stand with min guard including standing with use of the grab bars for washing peri area and buttocks.  He would prop one LE up on the shower seat as well for this, which he will not be able to do at home secondary to safety and not having grab bars.  Once shower was complete, he transferred out to the toilet with min guard for donning shorts with min guard.  He then transferred out to the recliner to complete the rest of dressing.  Increased time needed secondary to MD visit and pt moving slowly secondary to increased pain and decreased ability to maintain selective attention.  Once dressing was completed with setup for his shirt and  gripper socks, he worked on oral hygiene in standing at the sink with min guard.  Finished session sitting in the recliner with call button and phone in reach and chair alarm pad in place.    Session 2: (2706-2376)  Pt completed functional mobility from the room to the therapy gym with min guard assist using the RW for support.  He then worked initially in sitting on ball toss and catch for visual tracking and head movements.  He was able to consistently catch the smaller beach ball with 90% accuracy.  Noted some decreased ability to maintain visual fixation on the ball when tracking from high to low on occasion, but overall he was able to complete head and eye movements without significant dizziness.  Next, he voiced the need to toilet, so had min ambulate with the RW down to the ADL apartment and transfer to the toilet with min guard assist.  All aspects of toileting were completed at min guard assist as well as standing at the sink to wash his hands.  He ambulated back to the gym where he continued to work on static and dynamic standing balance with use of the rebounder in standing as well as reaching to different areas down low for picking up and placing clothespins unilaterally and bilaterally.  Min assist needed for balance.  Finished with having him stand and try to complete rapid saccadic movements left to right with  LOB posteriorly noted and pt reporting some slight dizziness.  Returned to room at the end of the session via walker and min guard assist, with pt left sitting up in the recliner with call button and phone in reach and his father present as well.    Therapy Documentation Precautions:  Precautions Precautions: Fall Precaution Comments: increased light sensitivity Restrictions Weight Bearing Restrictions: No  Pain: Pain Assessment Pain Scale: 0-10 Pain Score: 8  Pain Type: Acute pain Pain Location: Head Pain Descriptors / Indicators: Aching Pain Onset: On-going Pain  Intervention(s): Medication (See eMAR) ADL: See Care Tool Section for some details of mobility and selfcare  Therapy/Group: Individual Therapy  Macon Sandiford OTR/L 11/28/2019, 12:41 PM

## 2019-11-28 NOTE — Progress Notes (Signed)
Physical Therapy Session Note  Patient Details  Name: [name] MRN: [id number] Date of Birth: 07-Feb-1971  Today's Date: 11/28/2019 PT Individual Time: 6761-9509 PT Individual Time Calculation (min): 43 min   Short Term Goals: Week 1:  PT Short Term Goal 1 (Week 1): Pt will perform sit<>stands with CGA PT Short Term Goal 2 (Week 1): Pt will perform bed<>chair transfers with CGA PT Short Term Goal 3 (Week 1): Pt will ambulate at least 191ft using LRAD with CGA PT Short Term Goal 4 (Week 1): Pt will ascend/descend 4 steps using HRs with min assist  Skilled Therapeutic Interventions/Progress Updates:    Pt received sitting in recliner with his wife and father present. Pt appears to not feel well (keepings eyes closed and head held down as well as moving slow and guarded) and per chart review and pt/family report pt is having severe headache pain continuing to report the "band" like sensation wrapping around his head. Despite this, pt agreeable to therapy session but is slow to initiate mobility towards therapy gym. Sit<>stands using RW with CGA for safety/steadying. Gait training ~143ft to therapy gym using RW with CGA for steadying - pt ambulating at even slower speed today requiring 2 standing rest breaks prior to getting to gym due to not feeling well. Upon arrival to gym pt reports he feels as though he has been "beat up" and states he feels "stupid" (described as not thinking at his normal level). Therapist provided emotional support and encouragement on pt's progress and recovery with his symptoms associated with location of CVA. 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 Lateral side stepping ~38ft down/back at hallway rail without UE support with min assist for balance as pt has moderate postural sway with this task especially when changing from one direction to the other - pt steps very slow with significantly increased time in wide based stance between steps to maintain balance. Dynamic standing balance task of alternate B LE foot taps on 6" step using B UE support on RW progressed to no UE support and increased speed of LE movements - required CGA progressed to min assist for balance due to posterior lean. Gait training ~154ft back to room using RW with CGA - again pt having even slower gait speed today, requiring 1x standing rest break prior to getting back to his room. Pt left seated in recliner with needs in reach and chair alarm on.  Therapy Documentation Precautions:  Precautions Precautions: Fall Precaution Comments: increased light sensitivity Restrictions Weight Bearing Restrictions: No  Pain:   Reports severe headache with the "band" like feeling wrapping around his head - provided seated rest breaks and modified therapeutic interventions to avoid increased symptoms.   Therapy/Group: Individual Therapy  Ginny Forth , PT, DPT, CSRS  11/28/2019, 7:52 AM

## 2019-11-28 NOTE — Progress Notes (Signed)
Patient was alert & responsive at the beginning of the shift. Visitor was present at the time. He c/o a headache & was given prn & his scheduled medication. He was taken to have a CT scan done & returned. He c/o a headache again. He states that the headache feels like a band across his forehead. When pointing to it , it seems to start on the right side & go across to the other side & is basically in the temples. He was give his scheduled oxycontin & norvasc. He was repositioned in bed & fell asleep for a litlle while. At approximately midnight, he called for more pain medication. He was informed that in a few minutes, I could give him his tramadol. When it was time, his prn tramadol & clonopin was given. He was taken to the restroom & situated in bed. After that, he slept. No acute distress was noted. He transfers fairly well with a rolling walker, a little unsteady & slow with walking. Will continue to monitor & pass on report to oncoming nurse.

## 2019-11-28 NOTE — Progress Notes (Signed)
CTH ordered and reviewed, shows good resolution of hemorrhage, ventriculomegaly resolved. Seen this morning on rounds, was up brushing his teeth, says he still feels unsteady and cognitively slow, some band-like headaches, no visual complaints, wound healing well. No neurosurgical intervention indicated, headaches per primary team. If refractory to usual treatment measures, can discuss with neurology for further guidance.

## 2019-11-29 ENCOUNTER — Inpatient Hospital Stay (HOSPITAL_COMMUNITY): Payer: 59 | Admitting: Occupational Therapy

## 2019-11-29 ENCOUNTER — Encounter (HOSPITAL_COMMUNITY): Payer: 59 | Admitting: Psychology

## 2019-11-29 ENCOUNTER — Inpatient Hospital Stay (HOSPITAL_COMMUNITY): Payer: 59 | Admitting: Physical Therapy

## 2019-11-29 LAB — CREATININE, SERUM
Creatinine, Ser: 1.06 mg/dL (ref 0.61–1.24)
GFR calc Af Amer: >60 mL/min (ref >60)
GFR calc non Af Amer: >60 mL/min (ref >60)

## 2019-11-29 NOTE — Progress Notes (Signed)
Occupational Therapy Session Note  Patient Details  Name: [name] MRN: [id number] Date of Birth: 18-Jul-1970  Today's Date: 11/29/2019 OT Individual Time: 0093-8182 OT Individual Time Calculation (min): 26 min  and Today's Date: 11/29/2019 OT Missed Time: 30 Minutes Missed Time Reason: Pain   Short Term Goals: Week 1:  OT Short Term Goal 1 (Week 1): Pt will complete tub shower transfers with min guard assist using the tub bench and RW for support. OT Short Term Goal 2 (Week 1): Pt will complete LB selfcare with min guard assist sit to stand for two consecutive session. OT Short Term Goal 3 (Week 1): Pt will complete convergence and tracking exercises with independence following handout. OT Short Term Goal 4 (Week 1): Pt will complete toilet transfers with min guard assist using the RW for support.  Skilled Therapeutic Interventions/Progress Updates:    Upon entering the room, pt seated on EOB with eyes closed and wife present in the room. Pt reporting unbearable headache pain. RN arrived with pain medication this session. Wife brought breakfast for pt and OT encouraged him to eat so he would not be taking meds on an empty stomach. OT turning up blinds to make room darker and pt's wife placing sunglasses on pt at end of session as well. Pt requesting to finish breakfast and attempt shower at next scheduled session. OT discussed possibility of having set therapy times in order to allow pt rest break where he could close eyes and potentially nap as he reports this makes him feel better. Changes made for schedule to start tomorrow as long as team is agreeable. Pt remained on EOB attempting to eat breakfast with all needs within reach and wife present in room.  30 missed minutes secondary to headache pain.   Therapy Documentation Precautions:  Precautions Precautions: Fall Precaution Comments: increased light sensitivity Restrictions Weight Bearing Restrictions: No General: General OT  Amount of Missed Time: 30 Minutes Vital Signs: Therapy Vitals Temp: 97.8 F (36.6 C) Pulse Rate: 80 Resp: 18 BP: (!) 128/97 Patient Position (if appropriate): Lying Oxygen Therapy SpO2: 99 % O2 Device: Room Air Pain:   ADL: ADL Eating: Independent Where Assessed-Eating: Chair Grooming: Supervision/safety Where Assessed-Grooming: Wheelchair Upper Body Bathing: Supervision/safety Where Assessed-Upper Body Bathing: Wheelchair Lower Body Bathing: Minimal assistance Where Assessed-Lower Body Bathing: Wheelchair Upper Body Dressing: Moderate assistance (standing) Where Assessed-Upper Body Dressing: Standing at sink Lower Body Dressing: Minimal assistance Where Assessed-Lower Body Dressing: Wheelchair, Standing at sink, Sitting at sink Toileting: Moderate assistance Where Assessed-Toileting: Teacher, adult education: Moderate assistance Toilet Transfer Method: Freight forwarder Equipment: Raised toilet seat   Therapy/Group: Individual Therapy  Alen Bleacher 11/29/2019, 8:40 AM

## 2019-11-29 NOTE — Progress Notes (Signed)
Patient refused CPAP for tonight. RT instructed patient to have RN call RT if he changes his mind. RT will monitor as needed. 

## 2019-11-29 NOTE — Progress Notes (Signed)
Physical Therapy Session Note  Patient Details  Name: [name] MRN: [id number] Date of Birth: 1970/06/08  Today's Date: 11/29/2019 PT Individual Time: 1300-1343 PT Individual Time Calculation (min): 43 min   Short Term Goals: Week 1:  PT Short Term Goal 1 (Week 1): Pt will perform sit<>stands with CGA PT Short Term Goal 2 (Week 1): Pt will perform bed<>chair transfers with CGA PT Short Term Goal 3 (Week 1): Pt will ambulate at least 145ft using LRAD with CGA PT Short Term Goal 4 (Week 1): Pt will ascend/descend 4 steps using HRs with min assist  Skilled Therapeutic Interventions/Progress Updates: Pt presents supine in bed and agreeable to therapy, although c/o HA.  Pt transfers sup to sit w/ supervision, but slow movements 2/2 pain.  Pt transfers sit to stand w/ CGA and amb 10' into BR.  Pt transfers stand <> sit w/ supervision and continent of urine.  Pt amb multiple trials w/ RW and CGA, w/ LOB x 3 each trial, but able to correct w/o PT, although guarding.  Pt amb up to 175' including turns to return to seat, noted decreased step length.  Pt amb w/ exaggerated knee flexion w/ step phase especially RLE.  Pt returned to room and wished to sit in recliner.  Seat alarm on and all needs in reach.  Father present in room.     Therapy Documentation Precautions:  Precautions Precautions: Fall Precaution Comments: increased light sensitivity Restrictions Weight Bearing Restrictions: No General:   Vital Signs:  Pain: c/o HA, not quantified. Pain Assessment Pain Scale: 0-10 Pain Score: Asleep Pain Type: Acute pain Pain Location: Head Pain Orientation: Anterior Pain Descriptors / Indicators: Headache Pain Onset: On-going Pain Intervention(s): Repositioned Mobility:        Therapy/Group: Individual Therapy  Lucio Edward 11/29/2019, 1:47 PM

## 2019-11-29 NOTE — Progress Notes (Signed)
Called to room by patient for complaint of dull ache present in the lateral aspect of the upper rib cage. Pt has no SOB, pain does not radiate, does not change in quality or intensity, skin is P/W/D. Pt is standing with therapy and speaking to writer without any difficulty. Deatra Ina PA and orders written

## 2019-11-29 NOTE — Progress Notes (Signed)
Occupational Therapy Weekly Progress Note  Patient Details  Name: [name] MRN: [id number] Date of Birth: 12-09-1970  Beginning of progress report period: November 22, 2019 End of progress report period: November 29, 2019  Today's Date: 11/29/2019 OT Individual Time: 9242-6834 OT Individual Time Calculation (min): 42 min    Patient has met 3 of 4 short term goals.  Pt is making steady progress with OT at this time.  He is completing UB selfcare with supervision and LB selfcare at min guard assist sit to stand.  He completes toilet transfers with use of the RW with min guard assist as well as transfers to the walk-in shower.  He continues to demonstrate decreased smoothness with tracking as well as decrease convergence for reading with some diplopia noted.  He demonstrates increased dizziness with head movements in standing or with mobility.  Max assist is needed for dynamic standing balance without use of an assistive device.  He continues to report increased headache pain which is being addressed medically.  Feel he is on target to meet supervision goals with expected discharge on 7/22.  Will continue with current OT POC.    Patient continues to demonstrate the following deficits: muscle weakness, unbalanced muscle activation, ataxia and decreased coordination, decreased visual motor skills, central origin and decreased standing balance, decreased postural control and decreased balance strategies and therefore will continue to benefit from skilled OT intervention to enhance overall performance with BADL, iADL and Reduce care partner burden.  Patient progressing toward long term goals..  Continue plan of care.  OT Short Term Goals Week 2:  OT Short Term Goal 1 (Week 2): Continue working on established LTGs set at supervision level overall.  Skilled Therapeutic Interventions/Progress Updates:    Session 1: [phone])  Pt worked on shower and dressing this session.  He reported pain at 5/10 in his  head before beginning.  RW was utilized for transfer to the toilet and the walk in shower with min guard assist.  He was able to complete all aspects of toileting with min guard using the RW.  With showering he would stand with use of the vertical grab bar for support while washing his front and back peri area as well as for washing his lower legs and feet, propped on the tub bench.  Min guard for balance with use of the grab bars.  He donned his shorts sitting on the commode with min guard and then completed all other aspects of dressing at the recliner at the same level.  He was left sitting in the recliner at end of session with safety alarm in place and pt resting.    Session 2: [phone])  Pt completed functional mobility down to the dayroom with use of the RW and min guard assist.  He maintained a slow rate of speed as well as very little head movement.  Once in the room, had him work on dynamic standing balance while engaged in [employer].  He had to stand and pick up bean bags from the chair on the left side and then stand and toss them without any UE support.  Min assist needed for balance at times with LOB to the right and left on two occasions.  He was able to ambulate to pick up the bags from the floor as well with min assist and wide BOS.  Noted ataxic movement with mobility with decreased smoothness of weightshift from side to side.  He completed four intervals of this  before returning to the room with use of the RW for support at min guard assist.  He was left in the recliner with his father present and call button and phone in reach.  Safety alarm pad in place as well.      Therapy Documentation Precautions:  Precautions Precautions: Fall Precaution Comments: increased light sensitivity Restrictions Weight Bearing Restrictions: No  Pain: Pain Assessment Pain Scale: 0-10 Pain Score: 6  Pain Type: Acute pain Pain Location: Head Pain Orientation: Anterior Pain Radiating Towards:   (left eye) Pain Descriptors / Indicators: Headache Pain Frequency: Constant Pain Onset: On-going Patients Stated Pain Goal: 4 Pain Intervention(s): Repositioned ADL: See Care Tool Section for some details of mobility and selfcare  Therapy/Group: Individual Therapy  [name] OTR/L 11/29/2019, 10:50 AM

## 2019-11-29 NOTE — Progress Notes (Signed)
Pt is asleep in recliner chair. Room is being kept as dark as possible and noise at a minimum.

## 2019-11-29 NOTE — Patient Care Conference (Cosign Needed)
Inpatient RehabilitationTeam Conference and Plan of Care Update Date: 11/29/2019   Time: 2:07 PM    Patient Name: [name]      Medical Record Number: [id number]  Date of Birth: June 01, 1970 Sex: Male         Room/Bed: 4W11C/4W11C-01 Payor Info: Payor: Advertising copywriter / Plan: [employer] OTHER / Product Type: *No Product type* /    Admit Date/Time:  11/22/2019  5:47 PM  Primary Diagnosis:  Ischemic stroke [facility])  Hospital Problems: Principal Problem:   Ischemic stroke (HCC) - L PICA infarct embolic in setting of PFO s/p crani Active Problems:   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)   Transaminitis    Expected Discharge Date: Expected Discharge Date: 12/07/19  Team Members Present: Physician leading conference: Dr. [name] Care Coodinator Present: Chana Bode, RN, BSN, CRRN;Christina Vita Barley, BSW Nurse Present: Other (comment) Eulah Citizen, RN) PT Present: Casimiro Needle, PT OT Present: Perrin Maltese, OT PPS Coordinator present : Fae Pippin, SLP     Current Status/Progress Goal Weekly Team Focus  Bowel/Bladder   Continent of bowel & bladder, LBM 11/29/19, had some constipation, went multiple days refusing other laxatives  remain continent  assess & assist as needed   Swallow/Nutrition/ Hydration             ADL's   setup for UB selfcare, min guard for LB selfcare as well as toileting with use of the RW and grab bars.  increased head pain with decreased convergence visually.  Dynamic standing balance without UE support is mod to max assist  supervision overall  selfcare retraining, balance retraining, transfer training, neuromuscular re-education, vision training, DME education.   Mobility   supervision bed mobility, CGA functional transfers using RW, CGA gait up to 128ft using RW, CGA 12 steps using B HRs; without RW pt requires mod/max assist for dynamic standing balance - pt demos very slow speed of movements with pt  guarding cervical spine to limit rotation and avoiding visual scanning as these worsen his symptoms  supervision overall  pt/family education, dynamic standing balance, transfers, gait training with LRAD, activity tolerance, incorporation of visual scanning and cervical rotation during functional tasks   Communication             Safety/Cognition/ Behavioral Observations            Pain   consistently c/o headache 8/10 pain scale, has tramadol, topamax, tylenol, fioricet prn  pain scale <5/10  assess & treat as needed   Skin   incision to posterior head with suture, OTA, no redness or drainage noted  no signs of infection  assess q shift     Team Discussion:  Discharge Planning/Teaching Needs:  Goal for patient to discharge home  If reccomended will schedule with spouse   Current Update: on target  Current Barriers to Discharge:  None; on target to meet supervision goals with OP PT follow up recommended  Possible Resolutions to Barriers:   Patient on target to meet rehab goals: yes, dizziness and LOB with head movements limiting progress along with self limiting actions by patient to protect self, slow movement required to avoid stimulation of symptoms and post stroke headache(s)  *See Care Plan and progress notes for long and short-term goals.   Revisions to Treatment Plan:  Teach static turns instead of moving turns    Medical Summary Current Status: Good family support, HA continue, poor sleep Weekly Focus/Goal: meds for HA, avoiding opioids  Barriers  to Discharge: Medical stability;Other (comments)  Barriers to Discharge Comments: HA pain Possible Resolutions to Barriers: med mangement for HA   Continued Need for Acute Rehabilitation Level of Care: The patient requires daily medical management by a physician with specialized training in physical medicine and rehabilitation for the following reasons: Direction of a multidisciplinary physical rehabilitation program to  maximize functional independence : Yes Medical management of patient stability for increased activity during participation in an intensive rehabilitation regime.: Yes Analysis of laboratory values and/or radiology reports with any subsequent need for medication adjustment and/or medical intervention. : Yes   I attest that I was present, lead the team conference, and concur with the assessment and plan of the team.   Chana Bode B 11/29/2019, 2:07 PM

## 2019-11-29 NOTE — Progress Notes (Signed)
[facility] PHYSICAL MEDICINE & REHABILITATION PROGRESS NOTE   Subjective/Complaints:  Discussed CT head result, pt still with HA, discussed BPs with pt and wife OT at bedside  ROS:  Denies CP, SOB, N/V/D  Objective:   CT HEAD WO CONTRAST  Result Date: 11/27/2019 CLINICAL DATA:  49 year old male status post suboccipital craniectomy for decompression of left PICA infarct. EXAM: CT HEAD WITHOUT CONTRAST TECHNIQUE: Contiguous axial images were obtained from the base of the skull through the vertex without intravenous contrast. COMPARISON:  11/17/2019 and earlier. FINDINGS: Brain: Largely resolved inferior cerebellar hemorrhage following suboccipital decompression. There is residual bilateral inferior cerebellar edema and/or developing encephalomalacia, greater on the left. Improved patency of the cisterna magna and the 4th ventricle since 11/17/2019. Normalized lateral and 3rd ventricle size since 11/17/2019. Supratentorial gray-white matter differentiation remains normal. No new intracranial blood products. Vascular: Stable noncontrast CT appearance of the large vessels. Skull: Stable suboccipital craniectomy. Sinuses/Orbits: Visualized paranasal sinuses and mastoids are stable and well pneumatized. Other: Suboccipital postoperative changes with trace soft tissue fluid but resolved soft tissue gas. No new orbit or scalp soft tissue finding. IMPRESSION: 1. Decreased posterior fossa mass effect, resolved mild ventriculomegaly, and largely resolved inferior cerebellar hemorrhage since 11/17/2019. There is residual bilateral inferior cerebellar edema and/or developing encephalomalacia. 2. No new intracranial abnormality. Electronically Signed   By: Genevie Ann M.D.   On: 11/27/2019 21:19   No results for input(s): WBC, HGB, HCT, PLT in the last 72 hours. No results for input(s): NA, K, CL, CO2, GLUCOSE, BUN, CREATININE, CALCIUM in the last 72 hours.  Intake/Output Summary (Last 24 hours) at 11/29/2019  0813 Last data filed at 11/28/2019 1854 Gross per 24 hour  Intake 582 ml  Output --  Net 582 ml     Physical Exam: Vital Signs Blood pressure (!) 128/97, pulse 80, temperature 97.8 F (36.6 C), resp. rate 18, height '6\' 3"'$  (1.905 m), weight 134.1 kg, SpO2 99 %.   General: No acute distress Mood and affect are appropriate Heart: Regular rate and rhythm no rubs murmurs or extra sounds Lungs: Clear to auscultation, breathing unlabored, no rales or wheezes Abdomen: Positive bowel sounds, soft nontender to palpation, nondistended Extremities: No clubbing, cyanosis, or edema Skin: No evidence of breakdown, no evidence of rash Neurologic: Cranial nerves II through XII intact, motor strength is 5/5 in bilateral deltoid, bicep, tricep, grip, hip flexor, knee extensors, ankle dorsiflexor and plantar flexor Sensory exam normal sensation to light touch and proprioception in bilateral upper and lower extremities Cerebellar exam normal finger to nose to finger as well as heel to shin in bilateral upper and lower extremities Musculoskeletal: Full range of motion in all 4 extremities. No joint swelling   Assessment/Plan: 1. 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  Physiatrist is providing close team supervision and 24 hour management of active medical problems listed below.  Physiatrist and rehab team continue to assess barriers to discharge/monitor patient progress toward functional and medical goals  Care Tool:  Bathing  Bathing activity did not occur:  (N/A) Body parts bathed by patient: Right arm, Left arm, Chest, Abdomen, Front perineal area, Buttocks, Right upper leg, Face, Right lower leg, Left lower leg, Left upper leg         Bathing assist Assist Level: Contact Guard/Touching assist     Upper Body Dressing/Undressing Upper body dressing   What is the patient  wearing?:  Pull over shirt    Upper body assist Assist Level: Set up assist    Lower Body Dressing/Undressing Lower body dressing      What is the patient wearing?: Pants     Lower body assist Assist for lower body dressing: Contact Guard/Touching assist     Toileting Toileting    Toileting assist Assist for toileting: Contact Guard/Touching assist     Transfers Chair/bed transfer  Transfers assist     Chair/bed transfer assist level: Contact Guard/Touching assist Chair/bed transfer assistive device: Programmer, multimedia   Ambulation assist      Assist level: Contact Guard/Touching assist Assistive device: Walker-rolling Max distance: 116f   Walk 10 feet activity   Assist     Assist level: Contact Guard/Touching assist Assistive device: Walker-rolling   Walk 50 feet activity   Assist    Assist level: Contact Guard/Touching assist Assistive device: Walker-rolling    Walk 150 feet activity   Assist Walk 150 feet activity did not occur: Safety/medical concerns  Assist level: Contact Guard/Touching assist Assistive device: Walker-rolling    Walk 10 feet on uneven surface  activity   Assist Walk 10 feet on uneven surfaces activity did not occur: Safety/medical concerns         Wheelchair     Assist Will patient use wheelchair at discharge?: No (TBD but do not anticipate)             Wheelchair 50 feet with 2 turns activity    Assist            Wheelchair 150 feet activity     Assist          Blood pressure (!) 128/97, pulse 80, temperature 97.8 F (36.6 C), resp. rate 18, height '6\' 3"'$  (1.905 m), weight 134.1 kg, SpO2 99 %.   Medical Problem List and Plan: 1.  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 Formulized individual treatment plan based on medical history, underlying problem and comorbidities.  2.  Antithrombotics: -DVT/anticoagulation: Lovenox- discontinued: ambulating >175 feet             -antiplatelet therapy: Aspirin 81 mg daily and Plavix 75 mg daily x3 weeks then aspirin alone 3. Pain Management: Oxycodone as needed, tramadol as needed-adjusted, Fioricet as needed  See #11 Topamax for post crani HA off depakote  4. Mood: Lexapro 10 mg daily Klonopin 0.5 mg as needed sleep             -antipsychotic agents: N/A 5. Neuropsych: This patient is?  Fully capable of making decisions on his own behalf. 6. Skin/Wound Care: Routine skin checks 7. Fluids/Electrolytes/Nutrition: Routine in and outs.  8.  PFO.  Follow-up outpatient Dr. CBurt Knackfor consideration of closure 9.  Hypertension.  Norvasc 5 mg daily, Cozaar 50 mg daily.          Vitals:   11/28/19 2026 11/29/19 0529  BP: (!) 142/105 (!) 128/97  Pulse: 81 80  Resp: 16 18  Temp: 98.9 F (37.2 C) 97.8 F (36.6 C)  SpO2: 100% 99%   Propanolol 20 3 times daily started on 7/10, increased to 40 on 7/11  7/12: Continues to be elevated: increase Norvasc to '10mg'$ , monitor effect  COzaar increased to '75mg'$  on 7/14 10.  Hyperlipidemia.  Lipitor 11. Persistent severe headaches-likely vascular.    Depakote DC'd  See #9  Topamax 25 twice daily started on 7/10, increased to '50mg'$  BID 7/13   12.  Sleep apnea.  Continue CPAP 13.  Mild transaminitis  LFTs mildly elevated on 7/8  Continue to monitor  LOS: 7 days A FACE TO FACE EVALUATION WAS PERFORMED  Charlett Blake 11/29/2019, 8:13 AM

## 2019-11-29 NOTE — Progress Notes (Signed)
Patient ID: [name], male   DOB: 06/13/1970, 49 y.o.   MRN: [id number] Team Conference Report to Patient/Family  Team Conference discussion was reviewed with the patient and caregiver, including goals, any changes in plan of care and target discharge date.  Patient and caregiver express understanding and are in agreement.  The patient has a target discharge date of 12/07/19.  Andria Rhein 11/29/2019, 2:06 PM

## 2019-11-29 NOTE — Plan of Care (Signed)
MD is monitoring intractable H/A  and adjusting medications

## 2019-11-30 ENCOUNTER — Inpatient Hospital Stay (HOSPITAL_COMMUNITY): Payer: 59 | Admitting: Occupational Therapy

## 2019-11-30 ENCOUNTER — Inpatient Hospital Stay (HOSPITAL_COMMUNITY): Payer: 59 | Admitting: Physical Therapy

## 2019-11-30 ENCOUNTER — Inpatient Hospital Stay (HOSPITAL_COMMUNITY): Payer: 59 | Admitting: *Deleted

## 2019-11-30 MED ORDER — TOPIRAMATE 25 MG PO TABS
75.0000 mg | ORAL_TABLET | Freq: Two times a day (BID) | ORAL | Status: DC
  Administered 2019-11-30 – 2019-12-06 (×12): 75 mg via ORAL
  Filled 2019-11-30 (×13): qty 3

## 2019-11-30 NOTE — Progress Notes (Signed)
Physical Therapy Weekly Progress Note  Patient Details  Name: [name] MRN: [id number] Date of Birth: 1971-02-20  Beginning of progress report period: November 23, 2019 End of progress report period: November 30, 2019  Today's Date: 11/30/2019 PT Individual Time: 1110-1205 PT Individual Time Calculation (min): 55 min   Patient has met 4 of 4 short term goals.  [name] is progressing well with therapy demonstrating improvements with standing balance and gait training. He continues to demonstrate ataxia in standing with impaired force regulation causing increased postural sway and/or LOB with most significant impairments noted when performing head turns or visual scanning tasks as these also provoke the worst headache, nausea, and "pass out" feeling symptoms. He performs bed mobility with supervision, sit<>stand and stand pivot transfer using RW with CGA. He is ambulating up to ~161f using RW with CGA but when ambulating without AD requires specific set-up of environment to avoid LOB such as ambulating near a wall or in maxi-sky harness for safety as when pt has LOB can require max assist for recovery. He ascends/descends up to 12 steps using B HRs with min assist using reciprocal stepping pattern.  Patient continues to demonstrate the following deficits muscle weakness, decreased cardiorespiratoy endurance, impaired timing and sequencing, unbalanced muscle activation, ataxia and decreased coordination, decreased visual motor skills and decreased standing balance, decreased postural control and decreased balance strategies and therefore will continue to benefit from skilled PT intervention to increase functional independence with mobility.  Patient progressing toward long term goals..  Continue plan of care.  PT Short Term Goals Week 1:  PT Short Term Goal 1 (Week 1): Pt will perform sit<>stands with CGA PT Short Term Goal 1 - Progress (Week 1): Met PT Short Term Goal 2 (Week 1): Pt will perform  bed<>chair transfers with CGA PT Short Term Goal 2 - Progress (Week 1): Met PT Short Term Goal 3 (Week 1): Pt will ambulate at least 155fusing LRAD with CGA PT Short Term Goal 3 - Progress (Week 1): Met PT Short Term Goal 4 (Week 1): Pt will ascend/descend 4 steps using HRs with min assist PT Short Term Goal 4 - Progress (Week 1): Met Week 2:  PT Short Term Goal 1 (Week 2): = to LTGs based on ELOS  Skilled Therapeutic Interventions/Progress Updates:  Ambulation/gait training;Balance/vestibular training;Cognitive remediation/compensation;Community reintegration;Discharge planning;Disease management/prevention;DME/adaptive equipment instruction;Functional electrical stimulation;Functional mobility training;Neuromuscular re-education;Pain management;Patient/family education;Psychosocial support;Skin care/wound management;Splinting/orthotics;Stair training;Therapeutic Activities;Therapeutic Exercise;UE/LE Strength taining/ROM;UE/LE Coordination activities;Visual/perceptual remediation/compensation   Pt received asleep, supine in bed - awakens to his name. Pt states "I'm not feeling well today" but then goes on to say "I don't want to miss this opportunity" and agreeable to therapy session. Pt requires ~91m57mtes to initiate mobility to EOB using HOB elevating features with supervision and increased time. RN notified of pt not feeling well and present for medication administration. Pt sat on EOB ~5mi191mes to ground himself prior to initiating OOB mobility as pt reports he is feeling "fuzzy" today having a hard time "focusing." but did state that last night was the first night he has slept a significant amount of time. Sit<>stands using RW with CGA for safety/steadying throughout session. Gait ~15ft72fbathroom using RW with CGA. Sit<>stand on/off toilet with CGA - continent of bladder. Standing hand hygiene at sink with CGA (pt using front of his thigh on sink to stabilize himself) - education on proper AD  management at counter. Gait ~175ft 44fym using RW with CGA for steadying - only  1 brief standing break to regain balance.  Performed the following static standing balance challenges with primarily CGA but with increased difficulty of task required min assist: - narrow BOS eyes close 20 seconds - slight sway - semi-tandem stance eyes close 20seconds each foot - mild sway - normal BOS with R/L horizontal head turns x10 reps then pt states he feels like he is "going to pass out" so provided seated rest break - pt noted to be turning his head faster than last time performed this exercise with this therapist After seated rest break: - R/L body rotations x10 total - required min assist with 1x mod assist when turning L due to posterior LOB Repeated sit<>stands, no UE support, x10 reps with min assist for balance - cuing for increased anterior trunk lean/weight shift also once in standing due to posterior lean. Gait training ~10reps down/back 79f at hallway rail, no UE support, with CGA for steadying - pt immediately demonstrating reciprocal stepping, increasing gait speed, and more relaxed UE positioning with increasing arm swing - this is improved compared to prior sessions. Gait ~1532fback to room using RW with CGA for steadying and again 1x standing break to re-balance as pt reports he felt that he was "getting out of sequence." Pt left seated in recliner with needs in reach, chair alarm on, and pt's dad present.    Therapy Documentation Precautions:  Precautions Precautions: Fall Precaution Comments: increased light sensitivity Restrictions Weight Bearing Restrictions: No  Pain:   Continues to report headache pain with pt reporting he feels "fuzzy" - RN notified and present for medication administration - provided seated rest breaks as needed during session.   Therapy/Group: Individual Therapy  CaTawana Scale PT, DPT, CSRS  11/30/2019, 8:04 AM

## 2019-11-30 NOTE — Progress Notes (Signed)
Recreational Therapy Session Note  Patient Details  Name: [name] MRN: [id number] Date of Birth: 1971/02/17 Today's Date: 11/30/2019   Pt sleeping soundly upon arrival, did not awaken to knocking on the door or his name.  Will attempt eval completion at another time. Sorren Vallier 11/30/2019, 3:45 PM

## 2019-11-30 NOTE — Progress Notes (Signed)
[facility] PHYSICAL MEDICINE & REHABILITATION PROGRESS NOTE   Subjective/Complaints: Very tired but states he slept well, refused CPAP   ROS:  Denies CP, SOB, N/V/D  Objective:   No results found. No results for input(s): WBC, HGB, HCT, PLT in the last 72 hours. Recent Labs    11/29/19 0719  CREATININE 1.06    Intake/Output Summary (Last 24 hours) at 11/30/2019 0825 Last data filed at 11/29/2019 1700 Gross per 24 hour  Intake 480 ml  Output --  Net 480 ml     Physical Exam: Vital Signs Blood pressure (!) 120/96, pulse 85, temperature 97.8 F (36.6 C), temperature source Oral, resp. rate 18, height 6\' 3"  (1.905 m), weight 134.1 kg, SpO2 98 %.   General: No acute distress Mood and affect are appropriate Heart: Regular rate and rhythm no rubs murmurs or extra sounds Lungs: Clear to auscultation, breathing unlabored, no rales or wheezes Abdomen: Positive bowel sounds, soft nontender to palpation, nondistended Extremities: No clubbing, cyanosis, or edema Skin: No evidence of breakdown, no evidence of rash Neurologic: Cranial nerves II through XII intact, motor strength is 5/5 in bilateral deltoid, bicep, tricep, grip, hip flexor, knee extensors, ankle dorsiflexor and plantar flexor Sensory exam normal sensation to light touch and proprioception in bilateral upper and lower extremities Cerebellar exam normal finger to nose to finger as well as heel to shin in bilateral upper and lower extremities Musculoskeletal:. No joint swelling   Assessment/Plan: 1. 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  Physiatrist is providing close team supervision and 24 hour management of active medical problems listed below.  Physiatrist and rehab team continue to assess barriers to discharge/monitor patient progress toward functional and medical goals  Care Tool:  Bathing   Bathing activity did not occur:  (N/A) Body parts bathed by patient: Right arm, Left arm, Chest, Abdomen, Front perineal area, Buttocks, Right upper leg, Face, Right lower leg, Left lower leg, Left upper leg         Bathing assist Assist Level: Contact Guard/Touching assist     Upper Body Dressing/Undressing Upper body dressing   What is the patient wearing?: Pull over shirt    Upper body assist Assist Level: Set up assist    Lower Body Dressing/Undressing Lower body dressing      What is the patient wearing?: Pants     Lower body assist Assist for lower body dressing: Contact Guard/Touching assist     Toileting Toileting    Toileting assist Assist for toileting: Contact Guard/Touching assist     Transfers Chair/bed transfer  Transfers assist     Chair/bed transfer assist level: Contact Guard/Touching assist Chair/bed transfer assistive device:   Ambulation assist      Assist level: Contact Guard/Touching assist Assistive device: Walker-rolling Max distance: 75'   Walk 10 feet activity   Assist     Assist level: Contact Guard/Touching assist Assistive device: Walker-rolling   Walk 50 feet activity   Assist    Assist level: Contact Guard/Touching assist Assistive device: Walker-rolling    Walk 150 feet activity   Assist Walk 150 feet activity did not occur: Safety/medical concerns  Assist level: Contact Guard/Touching assist Assistive device: Walker-rolling    Walk 10 feet on uneven surface  activity   Assist Walk 10 feet on uneven surfaces activity did not occur: Safety/medical concerns  Wheelchair     Assist Will patient use wheelchair at discharge?: No (TBD but do not anticipate)             Wheelchair 50 feet with 2 turns activity    Assist            Wheelchair 150 feet activity     Assist          Blood pressure (!) 120/96, pulse 85, temperature 97.8  F (36.6 C), temperature source Oral, resp. rate 18, height 6\' 3"  (1.905 m), weight 134.1 kg, SpO2 98 %.   Medical Problem List and Plan: 1.  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  Antithrombotics: -DVT/anticoagulation: Lovenox- discontinued: ambulating >175 feet             -antiplatelet therapy: Aspirin 81 mg daily and Plavix 75 mg daily x3 weeks then aspirin alone 3. Pain Management: Oxycodone as needed, tramadol as needed-adjusted, Fioricet as needed  See #11 Topamax for post crani HA off depakote  4. Mood: Lexapro 10 mg daily Klonopin 0.5 mg as needed sleep             -antipsychotic agents: N/A 5. Neuropsych: This patient is?  Fully capable of making decisions on his own behalf. 6. Skin/Wound Care: Routine skin checks 7. Fluids/Electrolytes/Nutrition: Routine in and outs.  8.  PFO.  Follow-up outpatient Dr. 01/18/2020 for consideration of closure 9.  Hypertension.  Norvasc 5 mg daily, Cozaar 50 mg daily.          Vitals:   11/29/19 1946 11/30/19 0612  BP: 128/82 (!) 120/96  Pulse: 87 85  Resp:    Temp: 99.6 F (37.6 C) 97.8 F (36.6 C)  SpO2: 98% 98%   Propanolol 20 3 times daily started on 7/10, increased to 40 on 7/11  7/12: Continues to be elevated: increase Norvasc to 10mg , monitor effect  COzaar increased to 75mg  on 7/14- BP improving  10.  Hyperlipidemia.  Lipitor 11. Persistent severe headaches-likely vascular.    Depakote DC'd  See #9  Topamax 25 twice daily started on 7/10, increased to 50mg  BID 7/13, will increase to 75mg  BID 7/15   12.  Sleep apnea.  Continue CPAP 13.  Mild transaminitis  LFTs mildly elevated on 7/8  Continue to monitor  LOS: 8 days A FACE TO FACE EVALUATION WAS PERFORMED  11/30/2019, 8:25 AM

## 2019-11-30 NOTE — Progress Notes (Signed)
Occupational Therapy Session Note  Patient Details  Name: [name] MRN: [id number] Date of Birth: Jun 12, 1970  Today's Date: 11/30/2019 OT Individual Time: 7829-5621 OT Individual Time Calculation (min): 58 min    Short Term Goals: Week 2:  OT Short Term Goal 1 (Week 2): Continue working on established LTGs set at supervision level overall.  Skilled Therapeutic Interventions/Progress Updates:     Session 1: (3086-5784) Pt in bed to start session with MD present.  He was agreeable to completion of toileting as well as for showering.  He was able to transfer to the toilet with min guard assist using the RW for support with completion of toileting tasks at the same level.  He was able to complete transfer over to the tub bench after transferring to the toilet at min guard with use of the grab bars for support.  Bathing was completed sit to stand with min guard overall with pt standing to wash his peri area and lower legs, but placing them one at a time on the tub bench, while holding the vertical grab bar.  No LOB noted during task while grab bar was utilized.  Finished with drying off sit to stand and then ambulating out to the EOB for dressing tasks.  He finished session with completion of UB dressing at setup level and LB dressing with min guard assist.  He transitioned to supine at supervision level and was left with call button and phone in reach with safety alarm in place.  Pt with no reports of pain when asked this am.   Session 2: (6962-9528)  Pt up in the recliner to start session. Had him ambulate down to the ortho gym for start of session with supervision.  He had to stop several times in standing secondary to feeling woozy.  Therapist educated him on trying to focus on a target in the direction he was walking to help with stabilization of gaze.  Prior to reaching the ortho gym, had him complete step in shower tub transfer using the walls to stabilize on.  He was able to complete with min  guard for stepping in and out.  He then finished functional mobility to the therapy gym.  Had pt work with [place] string in sitting for conversion of vision.  He was able to fuse his eyes together to visually seeing one target at approximately 3 and 4 ft.  He was occasionally able to fuse at closer range of 2.5 ft, however the target would become fuzzy per his report and start to separate.  After blinking his eyes and resetting, he could get it back to one target, however this was only for a brief period.  Had him work on progressive head and eye movements together to track moving target side to side in sitting.  Slight increase in symptoms noted.  Also had him complete saccadic movement to two targets left and right.  Finished work with attempt at [employer] exercise with following target with head turns opposite of the target and with eyes following the target.  He was able to complete, but at a very slow rate of speed.  With all head and eye movements, he would exhibit a catchup saccade when tracking the target.  Returned to the room at the end of the session with pt sitting up in the recliner with his father present.  Call button and phone in reach with safety pad alarm in place.  Instructed pt to continue working on convergence exercises as  well as progressive head and eyes moving together when awake in his room.    Therapy Documentation Precautions:  Precautions Precautions: Fall Precaution Comments: increased light sensitivity,trunkial ataxia Restrictions Weight Bearing Restrictions: No  Pain: Pain Assessment Pain Scale: 0-10 Pain Score: 8  Pain Type: Acute pain Pain Location: Head Pain Descriptors / Indicators: Headache Pain Frequency: Intermittent Patients Stated Pain Goal: 4 Pain Intervention(s): Medication (See eMAR) ADL: See Care Tool Section for some details of mobility and selfcare  Therapy/Group: Individual Therapy  Copelan Maultsby OTR/L 11/30/2019, 12:12 PM

## 2019-12-01 ENCOUNTER — Inpatient Hospital Stay (HOSPITAL_COMMUNITY): Payer: 59 | Admitting: Occupational Therapy

## 2019-12-01 ENCOUNTER — Inpatient Hospital Stay (HOSPITAL_COMMUNITY): Payer: 59 | Admitting: Physical Therapy

## 2019-12-01 ENCOUNTER — Inpatient Hospital Stay (HOSPITAL_COMMUNITY): Payer: 59

## 2019-12-01 ENCOUNTER — Ambulatory Visit: Payer: 59 | Admitting: Cardiovascular Disease

## 2019-12-01 NOTE — Progress Notes (Signed)
Patient ID: [name], male   DOB: 11-06-1970, 49 y.o.   MRN: [id number]   [facility] walker ordered through [employer]

## 2019-12-01 NOTE — Progress Notes (Signed)
Pt. Refused CPAP tonight. No distress noted.

## 2019-12-01 NOTE — Progress Notes (Signed)
[place] PHYSICAL MEDICINE & REHABILITATION PROGRESS NOTE   Subjective/Complaints: Patient seen sitting up, working with therapy this morning.  He states he slept fairly well overnight.  He states he wakes up around 12 AM having to urinate.  He is working with therapies.  Headaches appear to be relatively controlled.  ROS: Denies CP, SOB, N/V/D  Objective:   No results found. No results for input(s): WBC, HGB, HCT, PLT in the last 72 hours. Recent Labs    11/29/19 0719  CREATININE 1.06    Intake/Output Summary (Last 24 hours) at 12/01/2019 1128 Last data filed at 11/30/2019 1900 Gross per 24 hour  Intake 400 ml  Output --  Net 400 ml     Physical Exam: Vital Signs Blood pressure 122/82, pulse 74, temperature 97.7 F (36.5 C), temperature source Oral, resp. rate 18, height 6\' 3"  (1.905 m), weight 134.8 kg, SpO2 99 %.  Constitutional: No distress . Vital signs reviewed. HENT: Normocephalic.  Atraumatic. Eyes: EOMI. No discharge. Cardiovascular: No JVD. Respiratory: Normal effort.  No stridor. GI: Non-distended. Skin: Warm and dry.  Intact. Psych: Normal mood.  Normal behavior. Musc: No edema in extremities.  No tenderness in extremities. Neurologic: Alert Motor: 5/5 grossly throughout   Assessment/Plan: 1. 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  Physiatrist is providing close team supervision and 24 hour management of active medical problems listed below.  Physiatrist and rehab team continue to assess barriers to discharge/monitor patient progress toward functional and medical goals  Care Tool:  Bathing  Bathing activity did not occur:  (N/A) Body parts bathed by patient: Right arm, Left arm, Chest, Abdomen, Front perineal area, Buttocks, Right upper leg, Face, Right lower leg, Left lower leg, Left upper leg         Bathing assist Assist Level:  Contact Guard/Touching assist     Upper Body Dressing/Undressing Upper body dressing   What is the patient wearing?: Pull over shirt    Upper body assist Assist Level: Set up assist    Lower Body Dressing/Undressing Lower body dressing      What is the patient wearing?: Pants     Lower body assist Assist for lower body dressing: Contact Guard/Touching assist     Toileting Toileting    Toileting assist Assist for toileting: Contact Guard/Touching assist     Transfers Chair/bed transfer  Transfers assist     Chair/bed transfer assist level: Contact Guard/Touching assist Chair/bed transfer assistive device:   Ambulation assist      Assist level: Contact Guard/Touching assist Assistive device: Walker-rolling Max distance: 164ft   Walk 10 feet activity   Assist     Assist level: Contact Guard/Touching assist Assistive device: Walker-rolling   Walk 50 feet activity   Assist    Assist level: Contact Guard/Touching assist Assistive device: Walker-rolling    Walk 150 feet activity   Assist Walk 150 feet activity did not occur: Safety/medical concerns  Assist level: Contact Guard/Touching assist Assistive device: Walker-rolling    Walk 10 feet on uneven surface  activity   Assist Walk 10 feet on uneven surfaces activity did not occur: Safety/medical concerns         Wheelchair     Assist Will patient use wheelchair at discharge?: No (TBD but do not anticipate)             Wheelchair 50 feet with  2 turns activity    Assist            Wheelchair 150 feet activity     Assist          Blood pressure 122/82, pulse 74, temperature 97.7 F (36.5 C), temperature source Oral, resp. rate 18, height 6\' 3"  (1.905 m), weight 134.8 kg, SpO2 99 %.   Medical Problem List and Plan: 1.  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  Antithrombotics: -DVT/anticoagulation: Lovenox- discontinued: ambulating >175 feet             -antiplatelet therapy: Aspirin 81 mg daily and Plavix 75 mg daily x3 weeks then aspirin alone 3. Pain Management: Oxycodone as needed, tramadol as needed-adjusted, Fioricet as needed  See #11 4. Mood: Lexapro 10 mg daily Klonopin 0.5 mg as needed sleep             -antipsychotic agents: N/A 5. Neuropsych: This patient is?  Fully capable of making decisions on his own behalf. 6. Skin/Wound Care: Routine skin checks 7. Fluids/Electrolytes/Nutrition: Routine in and outs.  8.  PFO.  Follow-up outpatient Dr. 01/18/2020 for consideration of closure 9.  Hypertension.          Vitals:   12/01/19 0500 12/01/19 0843  BP:  122/82  Pulse:  74  Resp:    Temp: 97.7 F (36.5 C)   SpO2:     Propanolol 20 3 times daily started on 7/10, increased to 40 on 7/11  Increase Norvasc to 10mg  on 7/12  Cozaar increased to 75mg  on 7/14  Controlled on 7/16 10.  Hyperlipidemia.    Continue Lipitor 11. Persistent severe headaches-likely vascular.    Depakote DC'd  See #9  Topamax 25 twice daily started on 7/10, increased to 50mg  BID 7/13, will increase to 75mg  BID 7/15  Improved on 7/16 12.  Sleep apnea.  Continue CPAP 13.  Mild transaminitis  LFTs mildly elevated on 7/8, labs ordered for Monday  Continue to monitor  LOS: 9 days A FACE TO FACE EVALUATION WAS PERFORMED  Quy Lotts 12/01/2019, 11:28 AM

## 2019-12-01 NOTE — Progress Notes (Signed)
Physical Therapy Session Note  Patient Details  Name: [name] MRN: [id number] Date of Birth: 21-Jul-1970  Today's Date: 12/01/2019 PT Individual Time: 1133-1206 PT Individual Time Calculation (min): 33 min   Short Term Goals: Week 1:  PT Short Term Goal 1 (Week 1): Pt will perform sit<>stands with CGA PT Short Term Goal 1 - Progress (Week 1): Met PT Short Term Goal 2 (Week 1): Pt will perform bed<>chair transfers with CGA PT Short Term Goal 2 - Progress (Week 1): Met PT Short Term Goal 3 (Week 1): Pt will ambulate at least 177f using LRAD with CGA PT Short Term Goal 3 - Progress (Week 1): Met PT Short Term Goal 4 (Week 1): Pt will ascend/descend 4 steps using HRs with min assist PT Short Term Goal 4 - Progress (Week 1): Met Week 2:  PT Short Term Goal 1 (Week 2): = to LTGs based on ELOS  Skilled Therapeutic Interventions/Progress Updates:    PAIN Denies pain this pm, states he is "a little fuzzy" from pills.  Pt initially sleeping and somewhat slow to arouse.  Supine to sit w/cues and cga. Pt sits for 2 min to "get his bearings".  STS w/cga to Rw and gait x 1525fw/CGa, stops occasionally to "reset" himself when step length becomes less symmetrical.  Standing balance activities: Standing tapping5  5in cones placed in horizontal line first using 2 hands on RW w/close supervision, then single UE support w/cga, then no UE support w/cga to min assist, performed w/both L and RLEs, seated rest break between conditions.  Standing on foam first w/bilat UE support on RW then performed without RW - eyes open w/mild increased sway up to 1 min Eyes closed - signficant increased A/P sway and cga to min assist w/tendency towards post loss of balance after 10-20 sec count, delayed balance reactions throughout.  Gait 15074f/rw to room, turn/sit to recliner w/close supervision.  Wife in room and asking about session/progress.  Educated re: increased reliance on vision as compensation for  balance deficits/safety implications.   Therapy Documentation Precautions:  Precautions Precautions: Fall Precaution Comments: increased light sensitivity,trunkial ataxia Restrictions Weight Bearing Restrictions: No    Therapy/Group: Individual Therapy  BarCallie FieldingT [place]16/2021, 12:30 PM

## 2019-12-01 NOTE — Progress Notes (Signed)
Physical Therapy Session Note  Patient Details  Name: [name] MRN: [id number] Date of Birth: February 20, 1971  Today's Date: 12/01/2019 PT Individual Time: 9629-5284 PT Individual Time Calculation (min): 64 min   Short Term Goals: Week 2:  PT Short Term Goal 1 (Week 2): = to LTGs based on ELOS  Skilled Therapeutic Interventions/Progress Updates:    Pt received sitting on toilet with RN present - therapist assumed care of patient. Pt agreeable to therapy session. Pt noted to have walked to bathroom, no AD, with nursing staff and pt reports feeling confident with this. Sit>stand toilet>no AD with CGA for steadying. Gait ~40ft to room, no AD, with CGA for steadying. Pt's wife had brought in tennis shoes. Therapist educated pt on trying toe-up brace for R foot drop and placed in his shoe - pt donned shoes set-up assist. Gait ~172ft to main gym using RW (due to pt wearing shoes 1st time and adding brace decided to ambulate with AD prior to no AD) - CGA for steadying - 1x standing break to re-balance himself. Gait training ~15ft down/back x6 at hallway rail, no UE support, with CGA for steadying - demos more of a R foot "slap" due to pt's height and weight causing strong force into ankle PF when pt performs heel strike and the toe-up brace not strong enough to prevent this. Meghan, orthotist, present and discussed AFO options with pt and therapist - pt agreeable to trying other orthoses. When turning to sit down pt turned more quickly (progressing towards his more normal speed of movement) causing him to feel "hot all of a sudden" and "flushed" - therapist provided water and therapeutic rest break. Educated pt on [place] walk-on brace and proper placement in shoes - donned with max assist. Gait training ~62ft down/back x4 progressed to ~82ft x2, starting with 1 UE support on hallway rail progressed to no UE support, with CGA and few instances of min assist due to lateral LOB - demos reciprocal stepping  pattern with pt able to get R heel strike and no foot slap (more symmetrical step lengths and decreasing high steppage gait pattern on R LE) - pt initially reports brace feels "weird" but with increased practice pt states "this is the most normal my stepping has felt in a year." Gait training ~128ft back to room using RW with CGA for steadying - 1x standing break to re-balance. Therapist educate pt on plan to continue trying AFOs and if decide orthosis allows most normal gait mechanics and decreases fall risk may need 1/2 size bigger shoe - pt in agreement. Pt left seated in recliner with needs in reach and chair alarm on.  Therapy Documentation Precautions:  Precautions Precautions: Fall Precaution Comments: increased light sensitivity,trunkial ataxia Restrictions Weight Bearing Restrictions: No  Pain: No reports of pain during session.    Therapy/Group: Individual Therapy  Ginny Forth , PT, DPT, CSRS  12/01/2019, 12:58 PM

## 2019-12-01 NOTE — Progress Notes (Signed)
Occupational Therapy Session Note  Patient Details  Name: [name] MRN: [id number] Date of Birth: 05/07/1971  Today's Date: 12/01/2019 OT Individual Time: 0800-0900 OT Individual Time Calculation (min): 60 min    Short Term Goals: Week 2:  OT Short Term Goal 1 (Week 2): Continue working on established LTGs set at supervision level overall.  Skilled Therapeutic Interventions/Progress Updates:    Pt completed shower and dressing to start session.  No reports of pain this session, but he did state that he didn't rest well last night.  He was able to ambulate to the bathroom without an assistive device and min guard assist.  Ataxic gait with wide BOS noted.  He was able to complete toileting tasks as well as removing of his clothing with min guard assist as well.  He transferred over to the shower and completed bathing sit to stand with min guard.  He stood for all LB bathing as well as for drying off with use of the grab bar for support.  [name] then transferred out to the EOB where he completed dressing at setup level for UB and min guard for LB secondary to slight LOB with standing to pull pants up unsupported.  Next, had pt ambulate out in the hallway without an assistive device and down the hall with min guard to min assist.  Increased LOB noted with head turns with mobility as well as pt stating that he gets quite dizzy when turning his head.  Finished session with return to the room with him electing to sit up in the wheelchair with the call button and phone in reach and safety alarm in place.    Therapy Documentation Precautions:  Precautions Precautions: Fall Precaution Comments: increased light sensitivity,trunkial ataxia Restrictions Weight Bearing Restrictions: No   Pain: Pain Assessment Pain Scale: Faces Pain Score: 0-No pain ADL: See Care Tool Section for some details of selfcare and mobility   Therapy/Group: Individual Therapy  Farooq Petrovich OTR/L 12/01/2019, 12:10  PM

## 2019-12-01 NOTE — Plan of Care (Signed)
  Problem: Consults Goal: RH STROKE PATIENT EDUCATION Description: See Patient Education module for education specifics  Outcome: Progressing   Problem: RH SKIN INTEGRITY Goal: RH STG SKIN FREE OF INFECTION/BREAKDOWN Description: Pt skin will be free of infection/breakdown with min assist Outcome: Progressing   Problem: RH SAFETY Goal: RH STG ADHERE TO SAFETY PRECAUTIONS W/ASSISTANCE/DEVICE Description: STG Adhere to Safety Precautions With cues/reminders Assistance/Device. Outcome: Progressing   Problem: RH COGNITION-NURSING Goal: RH STG ANTICIPATES NEEDS/CALLS FOR ASSIST W/ASSIST/CUES Description: STG Anticipates Needs/Calls for Assist With cues/reminders Assistance/Cues. Outcome: Progressing   Problem: RH PAIN MANAGEMENT Goal: RH STG PAIN MANAGED AT OR BELOW PT'S PAIN GOAL Description: Pt will be able to verbalize pain using scale and report if medication is effective at or below level 4 Outcome: Progressing   Problem: RH KNOWLEDGE DEFICIT Goal: RH STG INCREASE KNOWLEDGE OF HYPERTENSION Description: Patient will be able to manage HTN with diet, medications using handouts and educational materials independently Outcome: Progressing Goal: RH STG INCREASE KNOWLEDGE OF STROKE PROPHYLAXIS Description: Patient will be able to manage secondary stroke prevention with diet, medications using handouts and educational materials independently Outcome: Progressing   Problem: RH KNOWLEDGE DEFICIT Goal: RH STG INCREASE KNOWLEGDE OF HYPERLIPIDEMIA Description: Patient will be able to manage HLD with diet, medications using handouts and educational materials independently Outcome: Progressing   Problem: RH Vision Goal: RH LTG Vision (Specify) Outcome: Progressing

## 2019-12-02 NOTE — Progress Notes (Signed)
[place] PHYSICAL MEDICINE & REHABILITATION PROGRESS NOTE   Subjective/Complaints: Pt states he didn't sleep well. RT reports that he refused CPAP last night. Had been sleeping reasonably well other nights. Doesn't want to take lexapro  ROS: Patient denies fever, rash, sore throat, blurred vision, nausea, vomiting, diarrhea, cough, shortness of breath or chest pain, joint or back pain, headache     Objective:   No results found. No results for input(s): WBC, HGB, HCT, PLT in the last 72 hours. No results for input(s): NA, K, CL, CO2, GLUCOSE, BUN, CREATININE, CALCIUM in the last 72 hours.  Intake/Output Summary (Last 24 hours) at 12/02/2019 1058 Last data filed at 12/01/2019 1841 Gross per 24 hour  Intake 946 ml  Output --  Net 946 ml     Physical Exam: Vital Signs Blood pressure 118/85, pulse 81, temperature 97.9 F (36.6 C), resp. rate 18, height 6\' 3"  (1.905 m), weight 133.6 kg, SpO2 100 %.  Constitutional: No distress . Vital signs reviewed. HEENT: EOMI, oral membranes moist Neck: supple Cardiovascular: RRR without murmur. No JVD    Respiratory/Chest: CTA Bilaterally without wheezes or rales. Normal effort    GI/Abdomen: BS +, non-tender, non-distended Ext: no clubbing, cyanosis, or edema Psych: flat Musc: No edema in extremities.  No tenderness in extremities. Neurologic: Alert Motor: 5/5 grossly throughout   Assessment/Plan: 1. 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  Physiatrist is providing close team supervision and 24 hour management of active medical problems listed below.  Physiatrist and rehab team continue to assess barriers to discharge/monitor patient progress toward functional and medical goals  Care Tool:  Bathing  Bathing activity did not occur:  (N/A) Body parts bathed by patient: Right arm, Left arm, Chest, Abdomen, Front perineal  area, Buttocks, Right upper leg, Face, Right lower leg, Left lower leg, Left upper leg         Bathing assist Assist Level: Contact Guard/Touching assist     Upper Body Dressing/Undressing Upper body dressing   What is the patient wearing?: Pull over shirt    Upper body assist Assist Level: Set up assist    Lower Body Dressing/Undressing Lower body dressing      What is the patient wearing?: Pants     Lower body assist Assist for lower body dressing: Contact Guard/Touching assist     Toileting Toileting    Toileting assist Assist for toileting: Contact Guard/Touching assist     Transfers Chair/bed transfer  Transfers assist     Chair/bed transfer assist level: Contact Guard/Touching assist Chair/bed transfer assistive device:  (none)   Locomotion Ambulation   Ambulation assist      Assist level: Minimal Assistance - Patient > 75% Assistive device: No Device Max distance: 71ft   Walk 10 feet activity   Assist     Assist level: Minimal Assistance - Patient > 75% Assistive device: No Device   Walk 50 feet activity   Assist    Assist level: Minimal Assistance - Patient > 75% Assistive device: No Device    Walk 150 feet activity   Assist Walk 150 feet activity did not occur: Safety/medical concerns  Assist level: Contact Guard/Touching assist Assistive device: Walker-rolling    Walk 10 feet on uneven surface  activity   Assist Walk 10 feet on uneven surfaces activity did not occur: Safety/medical concerns         Wheelchair  Assist Will patient use wheelchair at discharge?: No (TBD but do not anticipate)             Wheelchair 50 feet with 2 turns activity    Assist            Wheelchair 150 feet activity     Assist          Blood pressure 118/85, pulse 81, temperature 97.9 F (36.6 C), resp. rate 18, height 6\' 3"  (1.905 m), weight 133.6 kg, SpO2 100 %.   Medical Problem List and Plan: 1.   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  Antithrombotics: -DVT/anticoagulation: Lovenox- discontinued: ambulating >175 feet             -antiplatelet therapy: Aspirin 81 mg daily and Plavix 75 mg daily x3 weeks then aspirin alone 3. Pain Management: Oxycodone as needed, tramadol as needed-adjusted, Fioricet as needed  See #11 4. Mood: Lexapro 10 mg daily Klonopin 0.5 mg as needed sleep  -pt states he no longer wants lexapro: will hold for now. Didn't appear to be in a mood to debate it this morning  -insomnia only last night. May be due to no CPAP--observe tonight             -antipsychotic agents: N/A 5. Neuropsych: This patient is?  Fully capable of making decisions on his own behalf. 6. Skin/Wound Care: Routine skin checks 7. Fluids/Electrolytes/Nutrition: Routine in and outs.  8.  PFO.  Follow-up outpatient Dr. 01/18/2020 for consideration of closure 9.  Hypertension.          Vitals:   12/01/19 1958 12/02/19 0448  BP: 120/83 118/85  Pulse: 95 81  Resp: 18 18  Temp: 98.7 F (37.1 C) 97.9 F (36.6 C)  SpO2: 99% 100%   Propanolol 20 3 times daily started on 7/10, increased to 40 on 7/11  Increase Norvasc to 10mg  on 7/12  Cozaar increased to 75mg  on 7/14  Controlled on 7/17 10.  Hyperlipidemia.    Continue Lipitor 11. Persistent severe headaches-likely vascular.    Depakote DC'd  See #9  Topamax 25 twice daily started on 7/10, increased to 50mg  BID 7/13, will increase to 75mg  BID 7/15  Improved on 7/17 12.  Sleep apnea.  Continue CPAP 13.  Mild transaminitis  LFTs mildly elevated on 7/8, labs ordered for Monday  Continue to monitor  LOS: 10 days A FACE TO FACE EVALUATION WAS PERFORMED  12/02/2019, 10:58 AM

## 2019-12-02 NOTE — Progress Notes (Signed)
Occupational Therapy Session Note  Patient Details  Name: [name] MRN: [id number] Date of Birth: 1970/10/16  Today's Date: 12/02/2019 OT Individual Time: 6468-0321 OT Individual Time Calculation (min): 46 min    Short Term Goals: Week 1:  OT Short Term Goal 1 (Week 1): Pt will complete tub shower transfers with min guard assist using the tub bench and RW for support. OT Short Term Goal 1 - Progress (Week 1): Met OT Short Term Goal 2 (Week 1): Pt will complete LB selfcare with min guard assist sit to stand for two consecutive session. OT Short Term Goal 2 - Progress (Week 1): Met OT Short Term Goal 3 (Week 1): Pt will complete convergence and tracking exercises with independence following handout. OT Short Term Goal 3 - Progress (Week 1): Not met OT Short Term Goal 4 (Week 1): Pt will complete toilet transfers with min guard assist using the RW for support. OT Short Term Goal 4 - Progress (Week 1): Met  Skilled Therapeutic Interventions/Progress Updates:   Pt in bedside recliner with his father present, agreeable to completion of selfcare tasks.  He was able to complete functional mobility to the bathroom and to the shower with min guard assist.  He stood in order to take his shower with use of the grab bars for support.  He completed this with min guard assist including standing to dry off.  One LOB noted with ambulation out of the shower, requiring min assist.  He then completed dressing sit to stand from the toilet as well as his bedside recliner.  Once complete, had pt perform functional mobility around the unit without an assistive device and min assist.  Increased LOB noted to the left during turns to the left.  While walking he continues to demonstrate guarded rigid gait pattern with limited head movement.  Finished session with pt in the recliner with call button in reach and safety alarm in place.     Therapy Documentation Precautions:  Precautions Precautions:  Fall Precaution Comments: increased light sensitivity,trunkial ataxia Restrictions Weight Bearing Restrictions: No   Pain: Pain Assessment Pain Scale: Faces Pain Score: 0-No pain ADL: See Care Tool Section for some details of mobility and selfcare  Therapy/Group: Individual Therapy  [name] OTR/L 12/02/2019, 12:20 PM

## 2019-12-03 NOTE — Progress Notes (Signed)
Patient refuses CPAP 

## 2019-12-03 NOTE — Progress Notes (Signed)
[facility] PHYSICAL MEDICINE & REHABILITATION PROGRESS NOTE   Subjective/Complaints: Slept better last night. Sleeping soundly when I came in  ROS: Patient denies fever, rash, sore throat, blurred vision, nausea, vomiting, diarrhea, cough, shortness of breath or chest pain, joint or back pain, headache, or mood change.   Objective:   No results found. No results for input(s): WBC, HGB, HCT, PLT in the last 72 hours. No results for input(s): NA, K, CL, CO2, GLUCOSE, BUN, CREATININE, CALCIUM in the last 72 hours.  Intake/Output Summary (Last 24 hours) at 12/03/2019 1024 Last data filed at 12/03/2019 0900 Gross per 24 hour  Intake 600 ml  Output --  Net 600 ml     Physical Exam: Vital Signs Blood pressure (!) 137/93, pulse 91, temperature 97.6 F (36.4 C), resp. rate 17, height 6\' 3"  (1.905 m), weight 131.6 kg, SpO2 100 %.  Constitutional: No distress . Vital signs reviewed. HEENT: EOMI, oral membranes moist Neck: supple Cardiovascular: RRR without murmur. No JVD    Respiratory/Chest: CTA Bilaterally without wheezes or rales. Normal effort    GI/Abdomen: BS +, non-tender, non-distended Ext: no clubbing, cyanosis, or edema Psych: flat Musc: No edema in extremities.  No tenderness in extremities. Neurologic: Alert Motor: 5/5 grossly in all 4's   Assessment/Plan: 1. 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  Physiatrist is providing close team supervision and 24 hour management of active medical problems listed below.  Physiatrist and rehab team continue to assess barriers to discharge/monitor patient progress toward functional and medical goals  Care Tool:  Bathing  Bathing activity did not occur:  (N/A) Body parts bathed by patient: Right arm, Left arm, Chest, Abdomen, Front perineal area, Buttocks, Right upper leg, Face, Right lower leg, Left lower leg, Left  upper leg         Bathing assist Assist Level: Contact Guard/Touching assist (standing in the shower with use of grab bars)     Upper Body Dressing/Undressing Upper body dressing   What is the patient wearing?: Pull over shirt    Upper body assist Assist Level: Set up assist    Lower Body Dressing/Undressing Lower body dressing      What is the patient wearing?: Pants     Lower body assist Assist for lower body dressing: Contact Guard/Touching assist     Toileting Toileting    Toileting assist Assist for toileting: Minimal Assistance - Patient > 75%     Transfers Chair/bed transfer  Transfers assist     Chair/bed transfer assist level: Contact Guard/Touching assist Chair/bed transfer assistive device:  (none)   Locomotion Ambulation   Ambulation assist      Assist level: Minimal Assistance - Patient > 75% Assistive device: No Device Max distance: 38ft   Walk 10 feet activity   Assist     Assist level: Minimal Assistance - Patient > 75% Assistive device: No Device   Walk 50 feet activity   Assist    Assist level: Minimal Assistance - Patient > 75% Assistive device: No Device    Walk 150 feet activity   Assist Walk 150 feet activity did not occur: Safety/medical concerns  Assist level: Contact Guard/Touching assist Assistive device: Walker-rolling    Walk 10 feet on uneven surface  activity   Assist Walk 10 feet on uneven surfaces activity did not occur: Safety/medical concerns         Wheelchair  Assist Will patient use wheelchair at discharge?: No (TBD but do not anticipate)             Wheelchair 50 feet with 2 turns activity    Assist            Wheelchair 150 feet activity     Assist          Blood pressure (!) 137/93, pulse 91, temperature 97.6 F (36.4 C), resp. rate 17, height 6\' 3"  (1.905 m), weight 131.6 kg, SpO2 100 %.   Medical Problem List and Plan: 1.  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  Antithrombotics: -DVT/anticoagulation: Lovenox- discontinued: ambulating >175 feet             -antiplatelet therapy: Aspirin 81 mg daily and Plavix 75 mg daily x3 weeks then aspirin alone 3. Pain Management: Oxycodone as needed, tramadol as needed-adjusted, Fioricet as needed  See #11 4. Mood: Lexapro 10 mg daily Klonopin 0.5 mg as needed sleep  -pt stated he no longer wants lexapro: have held it for now  -insomnia: improved Saturday night             -antipsychotic agents: N/A 5. Neuropsych: This patient is?  Fully capable of making decisions on his own behalf. 6. Skin/Wound Care: Routine skin checks 7. Fluids/Electrolytes/Nutrition: Routine in and outs.  8.  PFO.  Follow-up outpatient Dr. Friday for consideration of closure 9.  Hypertension.          Vitals:   12/03/19 0450 12/03/19 0803  BP: 131/79 (!) 137/93  Pulse: 87 91  Resp: 17   Temp: 97.6 F (36.4 C)   SpO2: 100%    Propanolol 20 3 times daily started on 7/10, increased to 40 on 7/11  Increase Norvasc to 10mg  on 7/12  Cozaar increased to 75mg  on 7/14  Reasonable control 7/18 10.  Hyperlipidemia.    Continue Lipitor 11. Persistent severe headaches-likely vascular.    Depakote DC'd  See #9  Topamax 25 twice daily started on 7/10, increased to 50mg  BID 7/13, will increase to 75mg  BID 7/15  Improved on 7/18 12.  Sleep apnea.  Continue CPAP 13.  Mild transaminitis  LFTs mildly elevated on 7/8, labs ordered for Monday  Continue to monitor  LOS: 11 days A FACE TO FACE EVALUATION WAS PERFORMED  12/03/2019, 10:24 AM

## 2019-12-04 ENCOUNTER — Inpatient Hospital Stay (HOSPITAL_COMMUNITY): Payer: 59

## 2019-12-04 ENCOUNTER — Inpatient Hospital Stay (HOSPITAL_COMMUNITY): Payer: 59 | Admitting: Occupational Therapy

## 2019-12-04 LAB — COMPREHENSIVE METABOLIC PANEL
ALT: 206 U/L — ABNORMAL HIGH (ref 0–44)
AST: 98 U/L — ABNORMAL HIGH (ref 15–41)
Albumin: 3.8 g/dL (ref 3.5–5.0)
Alkaline Phosphatase: 119 U/L (ref 38–126)
Anion gap: 9 (ref 5–15)
BUN: 20 mg/dL (ref 6–20)
CO2: 25 mmol/L (ref 22–32)
Calcium: 9.8 mg/dL (ref 8.9–10.3)
Chloride: 107 mmol/L (ref 98–111)
Creatinine, Ser: 1.25 mg/dL — ABNORMAL HIGH (ref 0.61–1.24)
GFR calc Af Amer: >60 mL/min (ref >60)
GFR calc non Af Amer: >60 mL/min (ref >60)
Glucose, Bld: 113 mg/dL — ABNORMAL HIGH (ref 70–99)
Potassium: 4.2 mmol/L (ref 3.5–5.1)
Sodium: 141 mmol/L (ref 135–145)
Total Bilirubin: 0.8 mg/dL (ref 0.3–1.2)
Total Protein: 8.1 g/dL (ref 6.5–8.1)

## 2019-12-04 NOTE — Progress Notes (Signed)
Occupational Therapy Session Note  Patient Details  Name: [name] MRN: [id number] Date of Birth: Jun 01, 1970  Today's Date: 12/04/2019 OT Individual Time: 9767-3419 OT Individual Time Calculation (min): 61 min    Short Term Goals: Week 2:  OT Short Term Goal 1 (Week 2): Continue working on established LTGs set at supervision level overall.  Skilled Therapeutic Interventions/Progress Updates:    Pt transferred from supine to sit EOB with supervision to begin session.  He was able to complete functional mobility to gather his clothing prior with min guard assist and no device.  He then transferred to the toilet at the same level with completion of toileting tasks at supervision as well.  Next, Mr. Velis ambulated over to the shower with use of the grab bar for support and completed showering in standing.  He used his hand on the wall at times as well as use of the grab bar when working on washing and rinsing his hair as well as when bringing his LEs up for washing and drying.  He next, ambulated out to the bedside recliner with min guard and completed dressing with supervision.  Mr. Kudrna then stood at the sink with supervision for oral hygiene.  Finished session with functional mobility in the hallway and down to past the therapy gym and back with min guard assist.  He needed to stop on several occasions secondary to fatigue at times as well as feeling slightly dizzy.  He still exhibits wide BOS with mobility as well as guarded posture with decreased head turns.  He was left sitting up in the recliner with call button and phone in reach and safety alarm in place.    Therapy Documentation Precautions:  Precautions Precautions: Fall Precaution Comments: increased light sensitivity,trunkial ataxia Restrictions Weight Bearing Restrictions: No   Pain: Pain Assessment Pain Scale: 0-10 Pain Score: 0-No pain Pain Type: Acute pain Pain Location: Head Pain Orientation: Mid Pain Onset:  On-going Patients Stated Pain Goal: 2 Pain Intervention(s): Medication (See eMAR) Multiple Pain Sites: No ADL: See Care Tool Section for some details of mobility and selfcare  Therapy/Group: Individual Therapy  Anissa Abbs OTR/L 12/04/2019, 12:09 PM

## 2019-12-04 NOTE — Progress Notes (Signed)
Physical Therapy Session Note  Patient Details  Name: [name] MRN: [id number] Date of Birth: 02/16/1971  Today's Date: 12/04/2019 PT Individual Time: 1104-1205 PT Individual Time Calculation (min): 61 min   Short Term Goals: Week 1:  PT Short Term Goal 1 (Week 1): Pt will perform sit<>stands with CGA PT Short Term Goal 1 - Progress (Week 1): Met PT Short Term Goal 2 (Week 1): Pt will perform bed<>chair transfers with CGA PT Short Term Goal 2 - Progress (Week 1): Met PT Short Term Goal 3 (Week 1): Pt will ambulate at least 116f using LRAD with CGA PT Short Term Goal 3 - Progress (Week 1): Met PT Short Term Goal 4 (Week 1): Pt will ascend/descend 4 steps using HRs with min assist PT Short Term Goal 4 - Progress (Week 1): Met Week 2:  PT Short Term Goal 1 (Week 2): = to LTGs based on ELOS Week 3:     Skilled Therapeutic Interventions/Progress Updates:    PAIN denies pain  Pt initially oob in recliner w/father at bedside.  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  Worked on gait in unfamiliar environments/glass hallway w/increased visual stimulation/busy hallways/included turns/visual scanning to tolerance. Cues to relax UEs and facilitate armswing due to guarding.  Coordination and balance training: Standing tapping evenly spaced flat targets on floor L to R and r to L w/each foot w/occasional min assist for balance.  Floor ladder - performed multimple different footwork pattern/passes length of ladder and returning  For dual task challenge.  Pt performs by verbalizing pattern as he progresses along length of ladder And cga.  Steady and accurate w/pattern, but decreased overall processing w/task.  Pt w/mult episodes of appearing anxious w/physical signs of mild shaking, "clammy feeling", and/or increased RR.  All controlled  w/brief rest and focus on deep breathing/relaxation strategies. Became tearful during session stating that he received "devastating news" from MD - told he would never be able to return to his current job due to balance deficits.  Emotional support provided and pt openly discussed feelings of loss w/therapist.    At end of session, pt ambulated back to room w/cga, stopped 1x when very loud/busy environment encountered at nurses station then proceeded to room.   Pt left oob in recliner w/chair alarm set and needs in reach, father at bedside.     Therapy Documentation Precautions:  Precautions Precautions: Fall Precaution Comments: increased light sensitivity,trunkial ataxia Restrictions Weight Bearing Restrictions: No    Therapy/Group: Individual Therapy  BaCallie FieldingPTMontrose/19/2021, 12:29 PM

## 2019-12-04 NOTE — Progress Notes (Signed)
   12/04/19 1600  Clinical Encounter Type  Visited With Patient and family together  Visit Type Initial  Referral From Patient;Nurse  Consult/Referral To Chaplain  Spiritual Encounters  Spiritual Needs Other (Comment) (Advanced Directive )   Chaplain engaged in initial visit with Casimiro Needle upon consult for an Scientist, water quality.  Chaplain explained to Aboubacar and his father what an Advanced Directive is and the process to have it notarized.  Azlaan stated he would let nurse know to contact us when he's ready.

## 2019-12-04 NOTE — Progress Notes (Signed)
[place] [facility] PHYSICAL MEDICINE & REHABILITATION PROGRESS NOTE   Subjective/Complaints: HA better, discussed Job requirements, ladders, heavy physical assist  ROS: Patient denies CP, SOB, + dizziness when turning head side to side Objective:   No results found. No results for input(s): WBC, HGB, HCT, PLT in the last 72 hours. Recent Labs    12/04/19 0553  NA 141  K 4.2  CL 107  CO2 25  GLUCOSE 113*  BUN 20  CREATININE 1.25*  CALCIUM 9.8    Intake/Output Summary (Last 24 hours) at 12/04/2019 0927 Last data filed at 12/03/2019 2300 Gross per 24 hour  Intake 840 ml  Output --  Net 840 ml     Physical Exam: Vital Signs Blood pressure 131/89, pulse 84, temperature 98.3 F (36.8 C), resp. rate 18, height 6\' 3"  (1.905 m), weight 133 kg, SpO2 97 %.   General: No acute distress Mood and affect are appropriate Heart: Regular rate and rhythm no rubs murmurs or extra sounds Lungs: Clear to auscultation, breathing unlabored, no rales or wheezes Abdomen: Positive bowel sounds, soft nontender to palpation, nondistended Extremities: No clubbing, cyanosis, or edema Skin: No evidence of breakdown, no evidence of rash Motor 5/5 , no dysmetria with FNF No dysconjugate gaze or facial droop   Assessment/Plan: 1. 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  Physiatrist is providing close team supervision and 24 hour management of active medical problems listed below.  Physiatrist and rehab team continue to assess barriers to discharge/monitor patient progress toward functional and medical goals  Care Tool:  Bathing  Bathing activity did not occur:  (N/A) Body parts bathed by patient: Right arm, Left arm, Chest, Abdomen, Front perineal area, Buttocks, Right upper leg, Face, Right lower leg, Left lower leg, Left upper leg         Bathing assist Assist Level:  Supervision/Verbal cueing (standing in the shower)     Upper Body Dressing/Undressing Upper body dressing   What is the patient wearing?: Pull over shirt    Upper body assist Assist Level: Set up assist    Lower Body Dressing/Undressing Lower body dressing    Lower body dressing activity did not occur: N/A What is the patient wearing?: Pants     Lower body assist Assist for lower body dressing: Contact Guard/Touching assist     Toileting Toileting Toileting Activity did not occur (Clothing management and hygiene only): N/A (no void or bm)  Toileting assist Assist for toileting: Supervision/Verbal cueing     Transfers Chair/bed transfer  Transfers assist     Chair/bed transfer assist level: Contact Guard/Touching assist Chair/bed transfer assistive device:  (none)   Locomotion Ambulation   Ambulation assist      Assist level: Minimal Assistance - Patient > 75% Assistive device: No Device Max distance: 3ft   Walk 10 feet activity   Assist     Assist level: Minimal Assistance - Patient > 75% Assistive device: No Device   Walk 50 feet activity   Assist    Assist level: Minimal Assistance - Patient > 75% Assistive device: No Device    Walk 150 feet activity   Assist Walk 150 feet activity did not occur: Safety/medical concerns  Assist level: Contact Guard/Touching assist Assistive device: Walker-rolling    Walk 10 feet on uneven surface  activity   Assist Walk 10 feet on uneven surfaces activity did not occur: Safety/medical concerns  Wheelchair     Assist Will patient use wheelchair at discharge?: No (TBD but do not anticipate)             Wheelchair 50 feet with 2 turns activity    Assist            Wheelchair 150 feet activity     Assist          Blood pressure 131/89, pulse 84, temperature 98.3 F (36.8 C), resp. rate 18, height 6\' 3"  (1.905 m), weight 133 kg, SpO2 97 %.   Medical  Problem List and Plan: 1.  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  Antithrombotics: -DVT/anticoagulation: Lovenox- discontinued: ambulating >175 feet             -antiplatelet therapy: Aspirin 81 mg daily and Plavix 75 mg daily x3 weeks then aspirin alone 3. Pain Management: Oxycodone as needed, tramadol as needed-adjusted, Fioricet as needed  See #11 4. Mood: Lexapro 10 mg daily Klonopin 0.5 mg as needed sleep  -pt stated he no longer wants lexapro: have held it for now  -insomnia: improved Saturday night             -antipsychotic agents: N/A 5. Neuropsych: This patient is?  Fully capable of making decisions on his own behalf. 6. Skin/Wound Care: Routine skin checks 7. Fluids/Electrolytes/Nutrition: Routine in and outs. Nl BMET 8.  PFO.  Follow-up outpatient Dr. Wednesday for consideration of closure 9.  Hypertension.          Vitals:   12/03/19 1944 12/04/19 0313  BP: (!) 117/91 131/89  Pulse: 96 84  Resp: 16 18  Temp: 98.9 F (37.2 C) 98.3 F (36.8 C)  SpO2: 95% 97%   Propanolol 20 3 times daily started on 7/10, increased to 40 on 7/11  Increase Norvasc to 10mg  on 7/12  Cozaar increased to 75mg  on 7/14  Good control 7/19 10.  Hyperlipidemia.    Continue Lipitor 11. Persistent severe headaches-likely vascular.    Depakote DC'd  See #9  Topamax 25 twice daily started on 7/10, increased to 50mg  BID 7/13, will increase to 75mg  BID 7/15  Improved 7/19 12.  Sleep apnea.  Continue CPAP 13.  Mild transaminitis  LFTs mildly elevated on 7/8, labs ordered for Monday  Continue to monitor  LOS: 12 days A FACE TO FACE EVALUATION WAS PERFORMED  12/04/2019, 9:27 AM

## 2019-12-04 NOTE — Progress Notes (Signed)
Patient ID: [name], male   DOB: [birth year], 49 y.o.   MRN: [id number]  Sw followed up with patient, family member at bedside. Patient very flat, hadn't touched lunch tray. Patient reported feeling ok and doing well, just ready to discharge home.

## 2019-12-04 NOTE — Progress Notes (Signed)
Occupational Therapy Session Note  Patient Details  Name: [name] MRN: [id number] Date of Birth: 07/14/1970  Today's Date: 12/04/2019 OT Individual Time: 1300-1408 OT Individual Time Calculation (min): 68 min    Short Term Goals: Week 2:  OT Short Term Goal 1 (Week 2): Continue working on established LTGs set at supervision level overall.  Skilled Therapeutic Interventions/Progress Updates:    Patient seated in recliner, father present.  He denies pain at this time but notes not feeling well earlier (describes flushed, sweaty episodes that resolve with rest)  He also notes being more emotional today than usual which is concerning to him.  Discussed sleep impact and stress of discharge pending.  Also discussed impact of more complicated therapy sessions due to progress to date.  Completed ambulation in room to/from toilet and sink with CS.  Ambulated to therapy gym CS/CGA.  Reviewed visual motor progress and provided challenge with pursuits (simulated marzden ball) - mild difficulty with ant/post and rotary movement, VOR (horiz and vert planes - at slow rate, mild HA, c/o slight increase in neck discomfort), saccades (slow rate and rest break needed) accomodation (needed to rest - c/o increased diaphoresis - vitals:  HR 86, O2 97%, BP 111/88)   Continued to discuss keeping track of symptoms.  Reviewed and provided word search book for visual scanning in him room.  He again states that he is not feeling well but at this time feels slight tightness in his chest (HR 88, BP 105/65)   He states that he feels okay to walk back to his room - noted increased unsteadiness, sat in recliner - nursing alerted and came to room to assess.  Assisted back to bed with CGA where he remained at close of session. Bed alarm set, call bell in hand, he states that he is feeling better but tired.    Therapy Documentation Precautions:  Precautions Precautions: Fall Precaution Comments: increased light  sensitivity,trunkial ataxia Restrictions Weight Bearing Restrictions: No  Therapy/Group: Individual Therapy  Barrie Lyme 12/04/2019, 7:43 AM

## 2019-12-04 NOTE — Progress Notes (Signed)
When patient returned from therapy, pt complained of feeling weak and flush. Pt did feel hot to the touch. Temp 99.3. Pt wanted to return to bed and complains that he did not sleep well last night. Pt did not eat lunch tray but family did bring him in breakfast this morning. Pt very flat today. Refused lexapro. Father at bedside today. Pt resting now. Will continue to monitor.

## 2019-12-05 ENCOUNTER — Inpatient Hospital Stay (HOSPITAL_COMMUNITY): Payer: 59 | Admitting: Occupational Therapy

## 2019-12-05 ENCOUNTER — Inpatient Hospital Stay (HOSPITAL_COMMUNITY): Payer: 59 | Admitting: Physical Therapy

## 2019-12-05 MED ORDER — AMLODIPINE BESYLATE 10 MG PO TABS: 10.0000 mg | ORAL_TABLET | Freq: Every day | ORAL | 0 refills | Status: DC

## 2019-12-05 MED ORDER — PROPRANOLOL HCL 40 MG PO TABS: 40.0000 mg | ORAL_TABLET | Freq: Three times a day (TID) | ORAL | 0 refills | Status: DC

## 2019-12-05 MED ORDER — MECLIZINE HCL 25 MG PO TABS: 25.0000 mg | ORAL_TABLET | Freq: Three times a day (TID) | ORAL | 0 refills | Status: DC | PRN

## 2019-12-05 MED ORDER — LOSARTAN POTASSIUM 25 MG PO TABS: 75.0000 mg | ORAL_TABLET | Freq: Every day | ORAL | 0 refills | Status: DC

## 2019-12-05 MED ORDER — DIAZEPAM 2 MG PO TABS: 2.0000 mg | ORAL_TABLET | Freq: Every day | ORAL | 0 refills | Status: DC

## 2019-12-05 MED ORDER — ACETAMINOPHEN 325 MG PO TABS: 650.0000 mg | ORAL_TABLET | Freq: Four times a day (QID) | ORAL | Status: DC | PRN

## 2019-12-05 MED ORDER — TOPIRAMATE 25 MG PO TABS: 75.0000 mg | ORAL_TABLET | Freq: Two times a day (BID) | ORAL | 0 refills | Status: DC

## 2019-12-05 MED ORDER — TRAMADOL HCL 50 MG PO TABS: 100.0000 mg | ORAL_TABLET | Freq: Four times a day (QID) | ORAL | 0 refills | Status: DC | PRN

## 2019-12-05 MED ORDER — BUTALBITAL-APAP-CAFFEINE 50-325-40 MG PO TABS: 2.0000 | ORAL_TABLET | Freq: Four times a day (QID) | ORAL | 0 refills | Status: DC | PRN

## 2019-12-05 MED ORDER — ATORVASTATIN CALCIUM 80 MG PO TABS: 80.0000 mg | ORAL_TABLET | Freq: Every day | ORAL | 0 refills | Status: DC

## 2019-12-05 MED ORDER — CLONAZEPAM 0.5 MG PO TABS: 0.5000 mg | ORAL_TABLET | ORAL | 0 refills | Status: DC | PRN

## 2019-12-05 MED ORDER — ESCITALOPRAM OXALATE 10 MG PO TABS: 10.0000 mg | ORAL_TABLET | Freq: Every day | ORAL | 0 refills | Status: DC

## 2019-12-05 MED ORDER — CLOPIDOGREL BISULFATE 75 MG PO TABS
75.0000 mg | ORAL_TABLET | Freq: Every day | ORAL | 0 refills | Status: DC
Start: 2019-12-05 — End: 2019-12-20

## 2019-12-05 NOTE — Progress Notes (Signed)
Occupational Therapy Session Note  Patient Details  Name: [name] MRN: [id number] Date of Birth: 06/17/1970  Today's Date: 12/05/2019 OT Individual Time: 3295-1884 OT Individual Time Calculation (min): 74 min    Short Term Goals: Week 2:  OT Short Term Goal 1 (Week 2): Continue working on established LTGs set at supervision level overall.  Skilled Therapeutic Interventions/Progress Updates:    Pt completed bathing and dressing to start session along with toileting.  He was able to transfer with min guard assist to the toilet and then to the shower.  He completed all toileting tasks as well as showering sit to stand with supervision.  Grab bar was utilized for support when standing in the shower.  He did report feeling really hot after the shower and therapist opened the door to allow cool air to come into the bathroom.  He was able to dry in standing with supervision and then sat on the toilet for donning his shorts.  He was able to ambulate out to the recliner to complete donning the rest of his clothing with supervision.  He was able to complete application of deodorant as well as completion of oral hygiene in standing with supervision.  Next, he ambulated in the room with min guard assist for picking up dirty clothing as well as dirty towels from the floor.  Had him then ambulate out to the therapy gym at min guard assist for work on balance and vision.  Had him try to stand from the therapy mat while tossing a beach ball to himself with min assist.  He reported increased dizziness with tracking of the ball with some increased pain at his incision with cervical extension.  He also reported increased sweating and feeling on his right side only, and needed rest because of these issues.  Returned to the room at end of session with pt being left sitting up in his recliner with the call button and phone in reach.  Encouraged him to work on his puzzle book as well as homework for saccades and  convergence.    Therapy Documentation Precautions:  Precautions Precautions: Fall Precaution Comments: increased light sensitivity,trunkial ataxia Restrictions Weight Bearing Restrictions: No  Pain: Pain Assessment Pain Scale: 0-10 Pain Score: 6  Pain Type: Acute pain Pain Location: Head Pain Orientation: Right;Left Pain Descriptors / Indicators: Headache Pain Frequency: Intermittent Pain Onset: On-going Patients Stated Pain Goal: 0 Pain Intervention(s): Medication (See eMAR) ADL: See Care Tool Section for some details of mobility and selfcare  Therapy/Group: Individual Therapy  TRUE Shackleford OTR/L 12/05/2019, 12:09 PM

## 2019-12-05 NOTE — Progress Notes (Signed)
PHYSICAL MEDICINE & REHABILITATION PROGRESS NOTE   Subjective/Complaints: Slept poorly but no HA, no issues overnite   ROS: Patient denies CP, SOB, + dizziness when turning head side to side Objective:   No results found. No results for input(s): WBC, HGB, HCT, PLT in the last 72 hours. Recent Labs    12/04/19 0553  NA 141  K 4.2  CL 107  CO2 25  GLUCOSE 113*  BUN 20  CREATININE 1.25*  CALCIUM 9.8    Intake/Output Summary (Last 24 hours) at 12/05/2019 0759 Last data filed at 12/04/2019 1900 Gross per 24 hour  Intake 900 ml  Output --  Net 900 ml     Physical Exam: Vital Signs Blood pressure 99/76, pulse 63, temperature 97.7 F (36.5 C), resp. rate 16, height 6\' 3"  (1.905 m), weight 132.8 kg, SpO2 100 %.    General: No acute distress Mood and affect are appropriate Heart: Regular rate and rhythm no rubs murmurs or extra sounds Lungs: Clear to auscultation, breathing unlabored, no rales or wheezes Abdomen: Positive bowel sounds, soft nontender to palpation, nondistended Extremities: No clubbing, cyanosis, or edema Skin: No evidence of breakdown, no evidence of rash Neurologic: Cranial nerves II through XII intact, motor strength is 5/5 in bilateral deltoid, bicep, tricep, grip, hip flexor, knee extensors, ankle dorsiflexor and plantar flexor  Musculoskeletal: Full range of motion in all 4 extremities. No joint swelling No dysconjugate gaze or facial droop   Assessment/Plan: 1. 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  Physiatrist is providing close team supervision and 24 hour management of active medical problems listed below.  Physiatrist and rehab team continue to assess barriers to discharge/monitor patient progress toward functional and medical goals  Care Tool:  Bathing  Bathing activity did not occur:  (N/A) Body parts bathed by  patient: Right arm, Left arm, Chest, Abdomen, Front perineal area, Buttocks, Right upper leg, Face, Right lower leg, Left lower leg, Left upper leg         Bathing assist Assist Level: Supervision/Verbal cueing (standing in the shower)     Upper Body Dressing/Undressing Upper body dressing   What is the patient wearing?: Pull over shirt    Upper body assist Assist Level: Set up assist    Lower Body Dressing/Undressing Lower body dressing    Lower body dressing activity did not occur: N/A What is the patient wearing?: Pants     Lower body assist Assist for lower body dressing: Contact Guard/Touching assist     Toileting Toileting Toileting Activity did not occur (Clothing management and hygiene only): N/A (no void or bm)  Toileting assist Assist for toileting: Supervision/Verbal cueing     Transfers Chair/bed transfer  Transfers assist     Chair/bed transfer assist level: Contact Guard/Touching assist Chair/bed transfer assistive device:  (none)   Locomotion Ambulation   Ambulation assist      Assist level: Contact Guard/Touching assist Assistive device: No Device Max distance: >711ft   Walk 10 feet activity   Assist     Assist level: Contact Guard/Touching assist Assistive device: No Device   Walk 50 feet activity   Assist    Assist level: Contact Guard/Touching assist Assistive device: No Device    Walk 150 feet activity   Assist Walk 150 feet activity did not occur: Safety/medical concerns  Assist level: Contact Guard/Touching assist Assistive device: No Device  Walk 10 feet on uneven surface  activity   Assist Walk 10 feet on uneven surfaces activity did not occur: Safety/medical concerns         Wheelchair     Assist Will patient use wheelchair at discharge?: No (TBD but do not anticipate)             Wheelchair 50 feet with 2 turns activity    Assist            Wheelchair 150 feet activity      Assist          Blood pressure 99/76, pulse 63, temperature 97.7 F (36.5 C), resp. rate 16, height 6\' 3"  (1.905 m), weight 132.8 kg, SpO2 100 %.   Medical Problem List and Plan: 1.  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  Antithrombotics: -DVT/anticoagulation: Lovenox- discontinued: ambulating >175 feet             -antiplatelet therapy: Aspirin 81 mg daily and Plavix 75 mg daily x3 weeks then aspirin alone 3. Pain Management: Oxycodone as needed, tramadol as needed-adjusted, Fioricet as needed  See #11 4. Mood: Lexapro 10 mg daily Klonopin 0.5 mg as needed sleep  -pt stated he no longer wants lexapro: have held it for now  -insomnia: improved Saturday night             -antipsychotic agents: N/A 5. Neuropsych: This patient is?  Fully capable of making decisions on his own behalf. 6. Skin/Wound Care: Routine skin checks 7. Fluids/Electrolytes/Nutrition: Routine in and outs. Nl BMET 8.  PFO.  Follow-up outpatient Dr. Monday for consideration of closure 9.  Hypertension.          Vitals:   12/04/19 1957 12/05/19 0316  BP: (!) 130/95 99/76  Pulse: (!) 101 63  Resp: 18 16  Temp: 98 F (36.7 C) 97.7 F (36.5 C)  SpO2: 98% 100%   Propanolol 20 3 times daily started on 7/10, increased to 40 on 7/11  Increase Norvasc to 10mg  on 7/12  Cozaar increased to 75mg  on 7/14  If systolic stays below 100 mmHg today , may reduce Cozaar to 50mg   10.  Hyperlipidemia.    Continue Lipitor 11. Persistent severe headaches-likely vascular.    Depakote DC'd  See #9  Topamax 25 twice daily started on 7/10, increased to 50mg  BID 7/13, will increase to 75mg  BID 7/15  Improved 7/20  12.  Sleep apnea.  Continue  CPAP 13.  Mild transaminitis  LFTs mildly elevated on 7/8, labs ordered for Monday  Continue to monitor  LOS: 13 days A FACE TO FACE EVALUATION WAS PERFORMED  8/13 12/05/2019, 7:59 AM

## 2019-12-05 NOTE — Progress Notes (Signed)
Patient ID: [name], male   DOB: 1970-11-17, 49 y.o.   MRN: [id number]   Patient has requested that no information be presented to spouse and he will like to be primary in contact with his healthcare. Patient requesting spouse information be removed from chart. Patient stated there are "changes happening in his household". Dad also in agrees and informed sw that him and patient live 2 blocks from each other. He will ensure patient gets to therapy and follow up appointments.

## 2019-12-05 NOTE — Progress Notes (Signed)
Kindly inform the patient that homocystine level was normal

## 2019-12-05 NOTE — Discharge Summary (Signed)
Physician Discharge Summary  Patient ID: [name] MRN: [id number] DOB/AGE: 48/03/1971 48 y.o.  Admit date: 11/22/2019 Discharge date: 12/06/2019  Discharge Diagnoses:  Principal Problem:   Ischemic stroke (HCC) - L PICA infarct embolic in setting of PFO s/p crani Active Problems:   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)   Transaminitis DVT prophylaxis Mood stabilization PFO Hypertension Hyperlipidemia OSA with CPAP Obesity  Discharged Condition: Stable  Significant Diagnostic Studies: CT ANGIO HEAD W OR WO CONTRAST  Result Date: 11/16/2019 CLINICAL DATA:  Cerebellar stroke EXAM: CT ANGIOGRAPHY HEAD AND NECK TECHNIQUE: Multidetector CT imaging of the head and neck was performed using the standard protocol during bolus administration of intravenous contrast. Multiplanar CT image reconstructions and MIPs were obtained to evaluate the vascular anatomy. Carotid stenosis measurements (when applicable) are obtained utilizing NASCET criteria, using the distal internal carotid diameter as the denominator. CONTRAST:  OMNIPAQUE IOHEXOL 350 MG/ML SOLN COMPARISON:  Correlation made with MRI performed earlier same day FINDINGS: CT HEAD Brain: There is hypodensity in the left cerebellum inferiorly reflecting infarction seen on prior MRI. There is no evidence of hemorrhagic transformation. No hydrocephalus. There is no extra-axial fluid collection. Ventricles and sulci are within normal limits in size and configuration. Vascular: No hyperdense vessel or unexpected calcification. Skull: Calvarium is unremarkable. Sinuses/Orbits: No acute finding. Other: None. Review of the MIP images confirms the above findings CTA NECK Aortic arch: Great vessel origins are patent. Right carotid system: Patent. No measurable stenosis at the ICA origin Left carotid system: Patent. Trace calcified plaque at the ICA origin without measurable stenosis. Vertebral arteries: Patent and  codominant. No measurable stenosis or evidence of dissection. Skeleton: Degenerative changes of the cervical spine, greatest at C5-C6 to C7-T1. Other neck: No mass or adenopathy. Upper chest: No apical lung mass. Review of the MIP images confirms the above findings CTA HEAD Anterior circulation: Intracranial internal carotid arteries are patent. Anterior and middle cerebral arteries are patent. Posterior circulation: Intracranial vertebral arteries patent. Patent bilateral PICA origins. Basilar artery is patent. A small right AICA is noted. Superior cerebellar artery origins are patent. Patent posterior cerebral arteries. Venous sinuses: Patent as allowed by contrast bolus timing. Review of the MIP images confirms the above findings IMPRESSION: Evolving acute left cerebellar PICA territory infarction. No hydrocephalus or hemorrhage. No large vessel occlusion or hemodynamically significant stenosis. Left PICA origin is patent Electronically Signed   By: Guadlupe Spanish M.D.   On: 11/16/2019 21:17   DG Chest 2 View  Result Date: 11/16/2019 CLINICAL DATA:  Dizziness and lethargy. EXAM: CHEST - 2 VIEW COMPARISON:  Earlier portable chest x-ray, same date. FINDINGS: Borderline cardiac enlargement with left ventricular configuration. Mild tortuosity of the thoracic aorta. The mediastinal and hilar contours are unremarkable. The lungs are clear. No pleural effusion. The bony thorax is intact. IMPRESSION: No acute cardiopulmonary findings. The slightly prominent mediastinal contour appear to be due to mild tortuosity of the thoracic aorta. Electronically Signed   By: Rudie Meyer M.D.   On: 11/16/2019 10:10   CT HEAD WO CONTRAST  Result Date: 11/27/2019 CLINICAL DATA:  49 year old male status post suboccipital craniectomy for decompression of left PICA infarct. EXAM: CT HEAD WITHOUT CONTRAST TECHNIQUE: Contiguous axial images were obtained from the base of the skull through the vertex without intravenous contrast.  COMPARISON:  11/17/2019 and earlier. FINDINGS: Brain: Largely resolved inferior cerebellar hemorrhage following suboccipital decompression. There is residual bilateral inferior cerebellar edema and/or developing encephalomalacia, greater on the  left. Improved patency of the cisterna magna and the 4th ventricle since 11/17/2019. Normalized lateral and 3rd ventricle size since 11/17/2019. Supratentorial gray-white matter differentiation remains normal. No new intracranial blood products. Vascular: Stable noncontrast CT appearance of the large vessels. Skull: Stable suboccipital craniectomy. Sinuses/Orbits: Visualized paranasal sinuses and mastoids are stable and well pneumatized. Other: Suboccipital postoperative changes with trace soft tissue fluid but resolved soft tissue gas. No new orbit or scalp soft tissue finding. IMPRESSION: 1. Decreased posterior fossa mass effect, resolved mild ventriculomegaly, and largely resolved inferior cerebellar hemorrhage since 11/17/2019. There is residual bilateral inferior cerebellar edema and/or developing encephalomalacia. 2. No new intracranial abnormality. Electronically Signed   By: Odessa Fleming M.D.   On: 11/27/2019 21:19   CT HEAD WO CONTRAST  Result Date: 11/17/2019 CLINICAL DATA:  Stroke, follow-up. Suboccipital craniectomy for decompression. EXAM: CT HEAD WITHOUT CONTRAST TECHNIQUE: Contiguous axial images were obtained from the base of the skull through the vertex without intravenous contrast. COMPARISON:  Head CT 11/17/2019. FINDINGS: Brain: Postoperative changes from interval suboccipital decompressive craniectomy. There is scattered small volume acute subarachnoid hemorrhage within the posterior fossa. Persistent abnormal hypodensity at site of a subacute left cerebellar PICA territory infarct. Persistent partial effacement of the fourth ventricle. However, previously demonstrated mild lateral and third ventriculomegaly appears resolved. Trace scattered postoperative  pneumocephalus predominantly within the posterior fossa. No demarcated cortical infarct is identified. No supratentorial midline shift No evidence of intracranial mass. No midline shift. Vascular: No hyperdense vessel. Skull: Suboccipital craniectomy. Sinuses/Orbits: Visualized orbits show no acute finding. Mild scattered paranasal sinus mucosal thickening. Small left maxillary sinus mucous retention cyst. No significant mastoid effusion. Other: Postsurgical changes to the suboccipital and upper neck soft tissues with subcutaneous gas. IMPRESSION: Postoperative changes from interval suboccipital decompressive craniectomy. There is scattered small volume postoperative subarachnoid hemorrhage within the posterior fossa. Redemonstrated parenchymal edema at site of a subacute left cerebellar PICA territory infarct. Persistent partial effacement of the fourth ventricle. However, the previously demonstrated subtle lateral and third ventriculomegaly appears resolved. Electronically Signed   By: Jackey Loge DO   On: 11/17/2019 21:37   CT HEAD WO CONTRAST  Result Date: 11/17/2019 CLINICAL DATA:  Follow-up stroke EXAM: CT HEAD WITHOUT CONTRAST TECHNIQUE: Contiguous axial images were obtained from the base of the skull through the vertex without intravenous contrast. COMPARISON:  11/16/2019 FINDINGS: Brain: There again noted changes consistent with the known left cerebellar infarct. No findings to suggest acute hemorrhage, new infarct or space-occupying mass lesion are seen. Vascular: No hyperdense vessel or unexpected calcification. Skull: Normal. Negative for fracture or focal lesion. Sinuses/Orbits: No acute finding. Other: None. IMPRESSION: Left cerebellar infarct relatively stable from the prior exam. No new focal abnormality is seen. Electronically Signed   By: Alcide Clever M.D.   On: 11/17/2019 12:31   CT ANGIO NECK W OR WO CONTRAST  Result Date: 11/16/2019 CLINICAL DATA:  Cerebellar stroke EXAM: CT ANGIOGRAPHY  HEAD AND NECK TECHNIQUE: Multidetector CT imaging of the head and neck was performed using the standard protocol during bolus administration of intravenous contrast. Multiplanar CT image reconstructions and MIPs were obtained to evaluate the vascular anatomy. Carotid stenosis measurements (when applicable) are obtained utilizing NASCET criteria, using the distal internal carotid diameter as the denominator. CONTRAST:  OMNIPAQUE IOHEXOL 350 MG/ML SOLN COMPARISON:  Correlation made with MRI performed earlier same day FINDINGS: CT HEAD Brain: There is hypodensity in the left cerebellum inferiorly reflecting infarction seen on prior MRI. There is no evidence of hemorrhagic  transformation. No hydrocephalus. There is no extra-axial fluid collection. Ventricles and sulci are within normal limits in size and configuration. Vascular: No hyperdense vessel or unexpected calcification. Skull: Calvarium is unremarkable. Sinuses/Orbits: No acute finding. Other: None. Review of the MIP images confirms the above findings CTA NECK Aortic arch: Great vessel origins are patent. Right carotid system: Patent. No measurable stenosis at the ICA origin Left carotid system: Patent. Trace calcified plaque at the ICA origin without measurable stenosis. Vertebral arteries: Patent and codominant. No measurable stenosis or evidence of dissection. Skeleton: Degenerative changes of the cervical spine, greatest at C5-C6 to C7-T1. Other neck: No mass or adenopathy. Upper chest: No apical lung mass. Review of the MIP images confirms the above findings CTA HEAD Anterior circulation: Intracranial internal carotid arteries are patent. Anterior and middle cerebral arteries are patent. Posterior circulation: Intracranial vertebral arteries patent. Patent bilateral PICA origins. Basilar artery is patent. A small right AICA is noted. Superior cerebellar artery origins are patent. Patent posterior cerebral arteries. Venous sinuses: Patent as allowed by  contrast bolus timing. Review of the MIP images confirms the above findings IMPRESSION: Evolving acute left cerebellar PICA territory infarction. No hydrocephalus or hemorrhage. No large vessel occlusion or hemodynamically significant stenosis. Left PICA origin is patent Electronically Signed   By: Guadlupe Spanish M.D.   On: 11/16/2019 21:17   MR BRAIN WO CONTRAST  Addendum Date: 11/16/2019   ADDENDUM REPORT: 11/16/2019 16:35 ADDENDUM: These results were called by telephone at the time of interpretation on 11/16/2019 at 4:34 pm to provider Dr. Jacqulyn Bath, who verbally acknowledged these results. Electronically Signed   By: Jackey Loge DO   On: 11/16/2019 16:35   Result Date: 11/16/2019 CLINICAL DATA:  Persistent vertigo; history of TIA, lupus; vertigo, central. EXAM: MRI HEAD WITHOUT CONTRAST TECHNIQUE: Multiplanar, multiecho pulse sequences of the brain and surrounding structures were obtained without intravenous contrast. COMPARISON:  No pertinent prior studies available for comparison. FINDINGS: Brain: There is a focus of restricted diffusion within the left cerebellar hemisphere measuring 4.1 x 3.3 cm in transaxial dimensions consistent with acute left PICA vascular territory infarct. Corresponding T2/FLAIR hyperintensity at this site. An additional punctate acute infarct versus artifact is questioned within the left occipital lobe (series 5, image 75). There is no significant white matter disease. No evidence of intracranial mass. No chronic intracranial blood products. No extra-axial fluid collection. No midline shift. Vascular: Expected proximal arterial flow voids. However, the left PICA is poorly assessed due to small vessel size. Skull and upper cervical spine: No focal marrow lesion. Sinuses/Orbits: Visualized orbits show no acute finding. Mild ethmoid sinus mucosal thickening. Small bilateral maxillary sinus mucous retention cysts. No significant mastoid effusion IMPRESSION: 4.1 cm acute left PICA  territory ischemic infarct within the left cerebellar hemisphere. There is no significant posterior fossa mass effect at this time. An additional punctate acute infarct is questioned within the left occipital lobe. Otherwise unremarkable MRI appearance of the brain. Mild ethmoid sinus mucosal thickening. Small bilateral maxillary sinus mucous retention cysts. Electronically Signed: By: Jackey Loge DO On: 11/16/2019 16:15   DG CHEST PORT 1 VIEW  Result Date: 11/22/2019 CLINICAL DATA:  Fevers EXAM: PORTABLE CHEST 1 VIEW COMPARISON:  11/16/2019 FINDINGS: Cardiac shadow is enlarged but accentuated by the portable technique. The lungs are clear bilaterally. No focal infiltrate or sizable effusion is noted. No bony abnormality is noted. IMPRESSION: No acute abnormality noted. Electronically Signed   By: Alcide Clever M.D.   On: 11/22/2019 20:12  DG Chest Portable 1 View  Result Date: 11/16/2019 CLINICAL DATA:  Chest pain. EXAM: PORTABLE CHEST 1 VIEW COMPARISON:  02/14/2007. FINDINGS: Mild prominence of the upper mediastinum noted. This may be secondary to AP apical lordotic portable technique. Upright PA and lateral chest x-ray suggested to exclude mediastinal widening. Borderline cardiomegaly also possibly related to AP technique. Low lung volumes with basilar atelectasis. No pleural effusion or pneumothorax. No displaced rib fracture identified. IMPRESSION: 1. Mild prominence of the upper mediastinum noted. This may be secondary to AP apical lordotic portable technique. Upright PA and lateral chest x-ray suggested to exclude mediastinal widening. Borderline cardiomegaly could also possibly related to AP technique. 2.  Low lung volumes with bibasilar atelectasis. This report was phoned to [facility] in the emergency room at 8:54 a.m. on 11/16/2019. Electronically Signed   By: Maisie Fus  Register   On: 11/16/2019 08:55   VAS [place] TRANSCRANIAL DOPPLER W BUBBLES  Result Date: 11/20/2019  Transcranial Doppler with  Bubble Indications: Stroke. Performing Technologist: Jeb Levering RDMS, RVT  Examination Guidelines: A complete evaluation includes B-mode imaging, spectral Doppler, color Doppler, and power Doppler as needed of all accessible portions of each vessel. Bilateral testing is considered an integral part of a complete examination. Limited examinations for reoccurring indications may be performed as noted.  Summary:  A vascular evaluation was performed. The right middle cerebral artery was studied. An IV was inserted into the patient's left AC fossa. Verbal informed consent was obtained.  Mild to moderate HITS heard at rest. Unable to perform Valsalva due to patient condition. PFO size: Spencer degree III Positibe TCD Bubble study indicative of small to medium size right to left shunt *See table(s) above for TCD measurements and observations.  Diagnosing physician: Delia Heady MD Electronically signed by Delia Heady MD on 11/20/2019 at 12:49:49 PM.    Final    ECHOCARDIOGRAM COMPLETE  Result Date: 11/17/2019    ECHOCARDIOGRAM REPORT   Patient Name:   [name] Date of Exam: 11/17/2019 Medical Rec #:  [id number]      Height:       75.0 in Accession #:    [id number]     Weight:       317.5 lb Date of Birth:  Oct 09, 1970      BSA:          2.671 m Patient Age:    48 years       BP:           156/89 mmHg Patient Gender: M              HR:           80 bpm. Exam Location:  Inpatient Procedure: 2D Echo, Color Doppler and Cardiac Doppler Indications:    Stroke i163.9  History:        Patient has no prior history of Echocardiogram examinations.                 Risk Factors:Hypertension and Sleep Apnea.  Sonographer:    Irving Burton Senior RDCS Referring Phys: [id number] Ollen Bowl  Sonographer Comments: Unable to turn due to extreme nausea. IMPRESSIONS  1. Left ventricular ejection fraction, by estimation, is 60 to 65%. The left ventricle has normal function. The left ventricle has no regional wall motion abnormalities. There is  mild asymmetric left ventricular hypertrophy of the septal segment. Left ventricular diastolic parameters were normal.  2. Right ventricular systolic function is normal. The right ventricular size is normal.  Tricuspid regurgitation signal is inadequate for assessing PA pressure.  3. The mitral valve is normal in structure. No evidence of mitral valve regurgitation. No evidence of mitral stenosis.  4. The aortic valve is tricuspid. Aortic valve regurgitation is not visualized. No aortic stenosis is present.  5. Aortic dilatation noted. 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 Sinus of valsalva measures 43 mm, aortic arch appears 40 mm.  6. The inferior vena cava is dilated in size with <50% respiratory variability, suggesting right atrial pressure of 15 mmHg. Conclusion(s)/Recommendation(s): No intracardiac source of embolism detected on this transthoracic study. A transesophageal echocardiogram is recommended to exclude cardiac source of embolism if clinically indicated. Consider CT angiography of the aorta to define aorta dimensions. FINDINGS  Left Ventricle: Left ventricular ejection fraction, by estimation, is 60 to 65%. The left ventricle has normal function. The left ventricle has no regional wall motion abnormalities. The left ventricular internal cavity size was normal in size. There is  mild asymmetric left ventricular hypertrophy of the septal segment. Left ventricular diastolic parameters were normal. Right Ventricle: The right ventricular size is normal. No increase in right ventricular wall thickness. Right ventricular systolic function is normal. Tricuspid regurgitation signal is inadequate for assessing PA pressure. Left Atrium: Left atrial size was normal in size. Right Atrium: Right atrial size was normal in size. Pericardium: There is no evidence of pericardial effusion. Mitral Valve: The mitral valve is normal in structure. Normal mobility of the mitral valve leaflets.  Mild mitral annular calcification. No evidence of mitral valve regurgitation. No evidence of mitral valve stenosis. Tricuspid Valve: The tricuspid valve is normal in structure. Tricuspid valve regurgitation is trivial. No evidence of tricuspid stenosis. Aortic Valve: The aortic valve is tricuspid. Aortic valve regurgitation is not visualized. No aortic stenosis is present. Pulmonic Valve: The pulmonic valve was normal in structure. Pulmonic valve regurgitation is trivial. No evidence of pulmonic stenosis. Aorta: Aortic dilatation noted. 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 Venous: The inferior vena cava is dilated in size with less than 50% respiratory variability, suggesting right atrial pressure of 15 mmHg. IAS/Shunts: The interatrial septum was not well visualized.  LEFT VENTRICLE PLAX 2D LVIDd:         6.10 cm  Diastology LVIDs:         4.70 cm  LV e' lateral:   11.70 cm/s LV PW:         0.80 cm  LV E/e' lateral: 8.0 LV IVS:        1.20 cm  LV e' medial:    12.40 cm/s LVOT diam:     2.60 cm  LV E/e' medial:  7.6 LV SV:         114 LV SV Index:   43 LVOT Area:     5.31 cm  RIGHT VENTRICLE RV S prime:     19.60 cm/s TAPSE (M-mode): 3.0 cm LEFT ATRIUM             Index       RIGHT ATRIUM           Index LA diam:        3.90 cm 1.46 cm/m  RA Area:     21.50 cm LA Vol (A2C):   74.2 ml 27.78 ml/m RA Volume:   60.10 ml  22.51 ml/m LA Vol (A4C):   71.9 ml 26.92 ml/m LA Biplane Vol: 78.6 ml 29.43 ml/m  AORTIC VALVE LVOT  Vmax:   89.40 cm/s LVOT Vmean:  68.900 cm/s LVOT VTI:    0.214 m  AORTA Ao Root diam: 4.30 cm Ao Asc diam:  4.50 cm Ao Arch diam: 4.0 cm MITRAL VALVE MV Area (PHT): 3.77 cm    SHUNTS MV Decel Time: 201 msec    Systemic VTI:  0.21 m MV E velocity: 93.80 cm/s  Systemic Diam: 2.60 cm MV A velocity: 51.40 cm/s MV E/A ratio:  1.82 Weston Brass MD Electronically signed by Weston Brass MD Signature Date/Time: 11/17/2019/10:46:56 AM    Final    ECHO TEE  Result  Date: 11/21/2019    TRANSESOPHOGEAL ECHO REPORT   Patient Name:   Henry RODRIQUEZ Date of Exam: 11/21/2019 Medical Rec #:  [id number]      Height:       75.0 in Accession #:    [id number]     Weight:       325.0 lb Date of Birth:  03-02-1971      BSA:          2.697 m Patient Age:    48 years       BP:           157/106 mmHg Patient Gender: M              HR:           73 bpm. Exam Location:  Inpatient Procedure: Transesophageal Echo, Cardiac Doppler and Color Doppler Indications:     Stroke  History:         Patient has prior history of Echocardiogram examinations, most                  recent 11/17/2019.  Sonographer:     Margreta Journey Referring Phys:  [id number] LAURA R INGOLD Diagnosing Phys: [name] MD PROCEDURE: After discussion of the risks and benefits of a TEE, an informed consent was obtained from the patient. The transesophogeal probe was passed without difficulty through the esophogus of the patient. Local oropharyngeal anesthetic was provided with Cetacaine. Sedation performed by different physician. Image quality was good. The patient developed no complications during the procedure. IMPRESSIONS  1. Left ventricular ejection fraction, by estimation, is 60 to 65%. The left ventricle has normal function. The left ventricle has no regional wall motion abnormalities.  2. Right ventricular systolic function is normal. The right ventricular size is normal.  3. No left atrial/left atrial appendage thrombus was detected.  4. The mitral valve is normal in structure. Trivial mitral valve regurgitation. No evidence of mitral stenosis.  5. The aortic valve is tricuspid. Aortic valve regurgitation is trivial. No aortic stenosis is present.  6. There is mild (Grade II) plaque involving the descending aorta.  7. No shunt seen by color flow doppler. Agitated saline contrast bubble study was positive with shunting observed within 3-6 cardiac cycles suggestive of interatrial shunt. Conclusion(s)/Recommendation(s):  Study positive for intra-atrial flow communication (positive bubble study). Highly mobile intra-atrial septum, but no clear PFO or ASD color flow seen despite multiple angles/windows. Recommend consideration of additional  outpatient imaging (cardiac MRI vs. Cardiac CT) to further evaluation anatomy prior to consideration of transcatheter closure. FINDINGS  Left Ventricle: Left ventricular ejection fraction, by estimation, is 60 to 65%. The left ventricle has normal function. The left ventricle has no regional wall motion abnormalities. The left ventricular internal cavity size was normal in size. There is  no left ventricular hypertrophy. Right Ventricle: The right ventricular size is normal.  No increase in right ventricular wall thickness. Right ventricular systolic function is normal. Left Atrium: Left atrial size was not well visualized. No left atrial/left atrial appendage thrombus was detected. Right Atrium: Right atrial size was not well visualized. Pericardium: There is no evidence of pericardial effusion. Mitral Valve: The mitral valve is normal in structure. Trivial mitral valve regurgitation. No evidence of mitral valve stenosis. There is no evidence of mitral valve vegetation. Tricuspid Valve: The tricuspid valve is normal in structure. Tricuspid valve regurgitation is trivial. No evidence of tricuspid stenosis. There is no evidence of tricuspid valve vegetation. Aortic Valve: Trivial regurgitation seen at Aortic aspect of RCC-NCC commissure. The aortic valve is tricuspid. Aortic valve regurgitation is trivial. No aortic stenosis is present. There is no evidence of aortic valve vegetation. Pulmonic Valve: The pulmonic valve was normal in structure. Pulmonic valve regurgitation is trivial. There is no evidence of pulmonic valve vegetation. Aorta: The aortic root is normal in size and structure. There is mild (Grade II) plaque involving the descending aorta. IAS/Shunts: The interatrial septum is  aneurysmal. No shunt seen by color flow doppler. Agitated saline contrast was given intravenously to evaluate for intracardiac shunting. Agitated saline contrast bubble study was positive with shunting observed within 3-6 cardiac cycles suggestive of interatrial shunt. [name] MD Electronically signed by [name] MD Signature Date/Time: 11/21/2019/4:36:07 PM    Final    VAS [place] LOWER EXTREMITY VENOUS (DVT)  Result Date: 11/17/2019  Lower Venous DVTStudy Indications: Stroke.  Performing Technologist: Jeb Levering RDMS, RVT  Examination Guidelines: A complete evaluation includes B-mode imaging, spectral Doppler, color Doppler, and power Doppler as needed of all accessible portions of each vessel. Bilateral testing is considered an integral part of a complete examination. Limited examinations for reoccurring indications may be performed as noted. The reflux portion of the exam is performed with the patient in reverse Trendelenburg.  +---------+---------------+---------+-----------+----------+--------------+ RIGHT    CompressibilityPhasicitySpontaneityPropertiesThrombus Aging +---------+---------------+---------+-----------+----------+--------------+ CFV      Full           Yes      Yes                                 +---------+---------------+---------+-----------+----------+--------------+ SFJ      Full                                                        +---------+---------------+---------+-----------+----------+--------------+ FV Prox  Full                                                        +---------+---------------+---------+-----------+----------+--------------+ FV Mid   Full                                                        +---------+---------------+---------+-----------+----------+--------------+ FV DistalFull                                                         +---------+---------------+---------+-----------+----------+--------------+  PFV      Full                                                        +---------+---------------+---------+-----------+----------+--------------+ POP      Full           Yes      Yes                                 +---------+---------------+---------+-----------+----------+--------------+ PTV      Full                                                        +---------+---------------+---------+-----------+----------+--------------+ PERO     Full                                                        +---------+---------------+---------+-----------+----------+--------------+   +---------+---------------+---------+-----------+----------+--------------+ LEFT     CompressibilityPhasicitySpontaneityPropertiesThrombus Aging +---------+---------------+---------+-----------+----------+--------------+ CFV      Full           Yes      Yes                                 +---------+---------------+---------+-----------+----------+--------------+ SFJ      Full                                                        +---------+---------------+---------+-----------+----------+--------------+ FV Prox  Full                                                        +---------+---------------+---------+-----------+----------+--------------+ FV Mid   Full                                                        +---------+---------------+---------+-----------+----------+--------------+ FV DistalFull                                                        +---------+---------------+---------+-----------+----------+--------------+ PFV      Full                                                        +---------+---------------+---------+-----------+----------+--------------+  POP      Full           Yes      Yes                                  +---------+---------------+---------+-----------+----------+--------------+ PTV      Full                                                        +---------+---------------+---------+-----------+----------+--------------+ PERO     Full                                                        +---------+---------------+---------+-----------+----------+--------------+     Summary: BILATERAL: - No evidence of deep vein thrombosis seen in the lower extremities, bilaterally. -No evidence of popliteal cyst, bilaterally.   *See table(s) above for measurements and observations. Electronically signed by Sherald Hess MD on 11/17/2019 at 2:59:28 PM.    Final     Labs:  Basic Metabolic Panel: Recent Labs  Lab 11/29/19 0719 12/04/19 0553  NA  --  141  K  --  4.2  CL  --  107  CO2  --  25  GLUCOSE  --  113*  BUN  --  20  CREATININE 1.06 1.25*  CALCIUM  --  9.8    CBC: No results for input(s): WBC, NEUTROABS, HGB, HCT, MCV, PLT in the last 168 hours.  CBG: No results for input(s): GLUCAP in the last 168 hours.  Family history.  Hypertension hyperlipidemia.  Denies any diabetes mellitus colon cancer esophageal cancer or rectal cancer  Brief HPI:   [name] is a 49 y.o. right-handed male with history of hypertension, sleep apnea with intermittent CPAP, hyperlipidemia, depression with anxiety, obesity with BMI 39.68.  Patient lives with spouse.  Works as a Psychologist, occupational.  1 level home 3 steps to entry.  Presented 11/16/2019 with dizziness and gait abnormality.  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  Additional punctate acute infarct question within the left occipital lobe.  CT angiogram of head and neck negative for large vessel occlusion or stenosis.  Echocardiogram with ejection fraction of 65% normal wall abnormalities.  TCD Doppler with bubble positive for PFO at rest.  TEE highly mobile intra-atrial septum.  But no clear PFO or  ASD flow seen despite multiple angle windows.  Admission chemistries potassium 3.3 glucose 189 troponin negative alcohol negative lactic acid 5.2.  Hospital course further complicated by increasing headaches and lethargy.  Follow-up CT imaging showed brainstem compression and obstructive hydrocephalus with patient undergoing suboccipital craniotomy 11/17/2019 per Dr. Johnsie Cancel.  Neurology follow-up patient was cleared to begin aspirin and Plavix for CVA prophylaxis x3 weeks then aspirin alone.  Patient would follow-up Dr. Excell Seltzer to consider PFO closure as an outpatient.  Patient was cleared to begin subcutaneous Lovenox for DVT prophylaxis.  Initially maintained on Depakote for persistent headaches.  Close monitoring of blood pressure initially on Cleviprex.  His diet was advanced to regular.  Therapy evaluations completed and patient was  admitted for a comprehensive rehab program.   Hospital Course: [name] was admitted to rehab 11/22/2019 for inpatient therapies to consist of PT, ST and OT at least three hours five days a week. Past admission physiatrist, therapy team and rehab RN have worked together to provide customized collaborative inpatient rehab.  Pertaining to patient's left PICA territory infarction status post craniotomy 11/17/2019 for brainstem compression and  obstructive hydrocephalus.  He would follow-up neurology as well as neurosurgery.  Aspirin and Plavix x3 weeks then aspirin alone.  He had been on Lovenox for DVT prophylaxis discontinued as patient was now ambulatory.  Pain management with the use of Fioricet for headaches as well as Topamax.  He was using tramadol on a limited basis.  His blood pressure was controlled on Cozaar as well as Norvasc he would follow-up with his primary MD.  Mood stabilization with Klonopin at night for sleep as well as low-dose Valium.  He had been on Lexapro that he continued to refuse.  Noted findings of PFO patient follow-up outpatient Dr. Excell Seltzerooper for  consideration of closure.  Lipitor ongoing for hyperlipidemia.  Sleep apnea CPAP as directed.   Blood pressures were monitored on TID basis and controlled   Rehab course: During patient's stay in rehab weekly team conferences were [name] to monitor patient's progress, set goals and discuss barriers to discharge. At admission, patient required moderate assist 150 feet rolling walker minimal assist sit to stand supervision supine to sit.  Set up upper body bathing mod assist lower body bathing minimal assist upper body dressing mod assist lower body dressing  Physical exam.  Blood pressure 145/98 pulse 91 temperature 98.5 respirations 18 oxygen saturation 98% room air HEENT Head.  Craniotomy site clean and dry Eyes.  Pupils round and reactive to light no discharge without nystagmus Neck.  Supple nontender no JVD without thyromegaly Cardiac regular rate rhythm without extra sounds or murmur heard Abdomen.  Soft nontender positive bowel sounds without rebound Respiratory effort normal no respiratory distress without wheeze Neurological alert made eye contact with examiner oriented to person place and time follows simple commands Motor.  Right upper extremity and right lower extremity 4+ 5 -/5 proximal distal Right lower extremity 4+/5 proximal distal   He/  has had improvement in activity tolerance, balance, postural control as well as ability to compensate for deficits. He/ has had improvement in functional use RUE/LUE  and RLE/LLE as well as improvement in awareness.  Ambulates 700 feet x 3 contact-guard assist no assistive device mild steppage right lower extremity due to premorbid foot drop.  Perform multiple different foot work patterns passes length of ladder and returning contact-guard assist.  He would have occasional bouts of anxiety during his ambulation with support provided.  He was able to complete functional mobility to gather his clothing prior for ADLs with minimal guard assist and no  device.  Transferred to the toilet at the same level completion of toileting task at supervision level as well.  He stood at sink side for supervision for oral hygiene.  Full family teaching was completed and plan discharged home       Disposition: Discharged home    Diet: Regular  Special Instructions: No driving smoking or alcohol  Medications at discharge 1.  Tylenol as needed 2.  Norvasc 10 mg p.o. daily 3.  Aspirin 81 mg p.o. daily 4.  Lipitor 80 mg p.o. daily 5.  Fioricet 2 tabs every 6 hours as needed headache 6.  Klonopin 0.5  mg as needed sleep 7.  Plavix 75 mg daily x1 week and stop 8.  Valium 2 mg p.o. nightly 9.  Colace 100 mg p.o. twice daily 10.  Lexapro 10 mg p.o. daily.  Patient had been refusing 11.  Cozaar 75 mg p.o. daily 12.  Antivert 25 mg p.o. 3 times daily as needed 13.  Inderal 40 mg p.o. 3 times daily 14.  Topamax 75 mg p.o. twice daily 15.  Tramadol 100 mg every 6 hours as needed moderate pain  30-35 minutes were spent completing discharge summary and discharge planning  Discharge Instructions    Ambulatory referral to Neurology   Complete by: As directed    An appointment is requested in approximately 4 weeks left PICA infarction with brainstem compression and obstructive hydrocephalus   Ambulatory referral to Physical Medicine Rehab   Complete by: As directed    Moderate complexity follow-up 1 to 2 weeks left PICA infarction with brainstem compression and obstructive hydrocephalus       Follow-up Information    Kirsteins, Victorino Sparrow, MD Follow up.   Specialty: Physical Medicine and Rehabilitation Why: Office to call for appointment Contact information: [address] [place] [address] [phone]        [name], MD Follow up.   Specialty: Cardiology Why: Call for appointment for evaluation of PFO and possible closure Contact information: [address] [place] [address] [phone]         [name], MD Follow up.   Specialty: Neurosurgery Why: Call for appointment Contact information: [address] [place] [address] [phone]               Signed: Charlton Amor 12/06/2019, 5:03 AM

## 2019-12-05 NOTE — Progress Notes (Signed)
Patient ID: [name], male   DOB: Jan 10, 1971, 49 y.o.   MRN: [id number]   Patient will discharge home tomorrow after therapy sessions.

## 2019-12-05 NOTE — Progress Notes (Signed)
Physical Therapy Session Note  Patient Details  Name: [name] MRN: [id number] Date of Birth: 11-20-1970  Today's Date: 12/05/2019 PT Individual Time: 1123-1204 and 3557-3220 PT Individual Time Calculation (min): 41 min and 72 min  Short Term Goals: Week 2:  PT Short Term Goal 1 (Week 2): = to LTGs based on ELOS  Skilled Therapeutic Interventions/Progress Updates:    Session 1: Pt received sitting in recliner with his father present and pt agreeable to therapy session. Based on pt's upcoming D/C date discussed recommendation of having patient follow-up about AFO with outpatient physical therapy - pt in agreement and necessary information provided. Sit<>stands, no AD, with CGA for steadying during session. Gait ~275ft to ortho gym, no AD, with CGA - continues to have R foot drop with compensation via high steppage gait, inconsistent stance phase times with increased postural sway and path deviation, guarded trunk/neck posturing (avoding head rotations). Provided pt with bariatric rollator to trial based on pt's improving gait speed to allow increased independence and provided seated rest breaks when needed in longer distance community ambulation scenarios. Pt suddenly becomes tearful stating "I'm so overwhelmed in every aspect of my life." Therapist provided emotional support and pt reports he feels that it has been much more difficulty for him to control his emotions since th CVA - discussed the symptoms of emotional liability after a CVA and talked about coping mechanisms with pt reporting he enjoys being in his garage sitting listening to music at home. Recommended continuing to surround himself with family and picking small, safe tasks to perform in his garage with the support of his best friend for safety to find some comfort. Gait training ~321ft using rollator with CGA and pt demonstrating improved gait speed, improved gait mechanics, and overall more relaxed posture - recommended this for  D/C and updated Christina, SW. Gait training ~50ft x4 down/back at hallway rail, no UE support, while performing 1x head turn R/L each walk - pt continues to have LOB in direction OPPOSITE the way he turned his head WHEN he goes to look back forward (ex: turns his head R but then when turns to look back straight ahead losses balance to L) requiring min assist and intermittent use of hallway railing to recover balance. Gait ~148ft back to room using rollator with CGA for safety. Pt left seated in w/c with needs in reach and chair alarm on.  Session 2: Pt received sitting in recliner and agreeable to therapy session. Sit<>stands, no AD, with close supervision/CGA for steadying/safety. Gait ~274ft using rollator with CGA/close supervision for safety while performing sudden start/stops, turning, and visually scanning environment - pt continues to demo improving gait mechanics using this AD allowing improved gait speed and symmetric of LE stepping patterns - pt also demos improved safety ambulating in busy hallway while using this AD. Performed the following dynamic standing balance tasks: - reaching outside BOS to grasp bean bags and toss at corn hole board - then walking without AD to pick up bean bags all with CGA for safety - pt does have some symptoms of dizziness after bending down to pick up bags requiring seated rest break for symptom dissipation  - 4-square foot tapping towards external target on verbal command with CGA for steadying - progressed to varying type of verbal command and then had pt turn 90degrees and have to recall location of targets  - static standing balance R/L horizontal head turns x10 reps at slow speed with close supervision  - static  standing horizontal trunk rotations to look R/L over shoulder with CGA - pt reports this still makes him feel as though he is going to pass out - provided seated rest break for symptom dissipation - overhead and cross body reaching diagonals while  holding 4lb dowel rod to create minor perturbations and require head turning and trunk rotations - min assist for balance due to posterior lean - requires seated rest break after due to symptom provocation with this task - 4 square stepping over sticks starting with lateral stepping then forward/backwrads then adding diagonal stepping with a few instances of min assist due to minor LOB Pt reports that he is still having major symptoms of fatigue - educated on post-stroke related fatigue and the gradual improvement. Gait training ~218ft, no AD, with CGA for steadying and 2x of min assist due to minor lateral LOB - pt demos improving B LE step lengths, more relaxed trunk posture with improving arm swing though continues to have guarded head/neck movements - able to progress to scanning environment and performing sudden start/stops all in a more open environment. Gait ~129ft back to room using rollator with CGA/close supervision. Therapist reinforced importance of performing visual scanning and gaze stabilization exercises as this continues to be the most symptom provoking task - therapist demonstrated how to correctly perform X1 gaze stabilization task. Pt left seated in recliner with needs in reach, his dad present, and chair alarm on.   Therapy Documentation Precautions:  Precautions Precautions: Fall Precaution Comments: increased light sensitivity,trunkial ataxia Restrictions Weight Bearing Restrictions: No  Pain: Session 1: No reports of pain throughout session.  Session 2: No reports of pain throughout session.   Therapy/Group: Individual Therapy  Ginny Forth , PT, DPT, CSRS  12/05/2019, 8:03 AM

## 2019-12-06 ENCOUNTER — Inpatient Hospital Stay (HOSPITAL_COMMUNITY): Payer: 59 | Admitting: Physical Therapy

## 2019-12-06 ENCOUNTER — Inpatient Hospital Stay (HOSPITAL_COMMUNITY): Payer: 59 | Admitting: Occupational Therapy

## 2019-12-06 LAB — CREATININE, SERUM
Creatinine, Ser: 1.19 mg/dL (ref 0.61–1.24)
GFR calc Af Amer: >60 mL/min (ref >60)
GFR calc non Af Amer: >60 mL/min (ref >60)

## 2019-12-06 NOTE — Progress Notes (Signed)
Inpatient Rehabilitation Care Coordinator  Discharge Note  The overall goal for the admission was met for:   Discharge location: Yes, Home   Length of Stay: Yes, 14 Days  Discharge activity level: Yes  Home/community participation: Yes  Services provided included: MD, RD, PT, OT, SLP, RN, CM, TR, Pharmacy, Neuropsych and SW  Financial Services: Private Insurance: [facility]  Follow-up services arranged: Outpatient: [facility]  Comments (or additional information): PT  Patient/Family verbalized understanding of follow-up arrangements: Yes  Individual responsible for coordination of the follow-up plan: self, [phone]  Confirmed correct DME delivered: Dyanne Iha 12/06/2019    Dyanne Iha

## 2019-12-06 NOTE — Progress Notes (Signed)
Pt discharged to home with personal belongings, D. Anguilli, [name] provided discharge instructions. Pt verbalized understanding of medications, d/c instructions, and folllow up appt

## 2019-12-06 NOTE — Progress Notes (Signed)
Occupational Therapy Discharge Summary  Patient Details  Name: RION CATALA MRN: [id number] Date of Birth: [birth year]  Today's Date: 12/06/2019 OT Individual Time: 4580-9983 OT Individual Time Calculation (min): 45 min   Session Note:  Pt completed shower and dressing during session. He was able to ambulate to the shower and around the room with min guard assist without an assistive device to both the toilet and the walk-in shower.  He was able to complete shower in standing with close supervision and use of the grab bars for support.  He was able to also stand for drying off as well as ambulate out to the bed for dressing tasks.  After completion of dressing tasks, educated pt on gaze stabilization exercises X1 in sitting and provided handout.  He currently has saccadic and tracking exercises as well as convergence exercises that he has been doing since early admission.  Finished session with pt sitting EOB with nursing in for administration of medications.  Call button and phone in reach.   Patient has met 13 of 13 long term goals due to improved activity tolerance, improved balance, ability to compensate for deficits, improved attention, improved awareness and improved coordination.  Patient to discharge at overall Supervision level.  Patient's care partner is independent to provide the necessary physical assistance at discharge.    Reasons goals not met: NA  Recommendation:  Patient will benefit from ongoing skilled OT services in outpatient setting to continue to advance functional skills in the area of BADL, iADL, Vocation and Reduce care partner burden.  Pt continues to demonstrate central vestibular deficits with increased LOB when incorporating head and eye movements during standing or mobility.  He continues to need use of the rollator for safety with mobility as well as use of a shower seat for bathing sit to stand as well.  Recommend continued OT to address vision, balance, and  vestibular impairment so that he can return to independent level and normalcy.   Equipment: No equipment provided  Reasons for discharge: treatment goals met and discharge from hospital  Patient/family agrees with progress made and goals achieved: Yes  OT Discharge Precautions/Restrictions  Precautions Precautions: Fall Precaution Comments: increased light sensitivity,trunkial ataxia Restrictions Weight Bearing Restrictions: No   Pain  No report of pain during session ADL ADL Eating: Independent Where Assessed-Eating: Chair Grooming: Independent Where Assessed-Grooming: Wheelchair Upper Body Bathing: Supervision/safety Where Assessed-Upper Body Bathing: Shower Lower Body Bathing: Supervision/safety Where Assessed-Lower Body Bathing: Shower Upper Body Dressing: Independent Where Assessed-Upper Body Dressing: Edge of bed Lower Body Dressing: Modified independent Where Assessed-Lower Body Dressing: Edge of bed Toileting: Modified independent Where Assessed-Toileting: Glass blower/designer: Diplomatic Services operational officer Method: Counselling psychologist: Raised toilet seat Tub/Shower Transfer: Close supervison Clinical cytogeneticist Method: Optometrist: Facilities manager: Close supervision Social research officer, government Method: Heritage manager: Grab bars Vision Baseline Vision/History: Wears glasses Wears Glasses: At all times Patient Visual Report: Eye fatigue/eye pain/headache;Other (comment);Blurring of vision Vision Assessment?: Yes Eye Alignment: Within Functional Limits Ocular Range of Motion: Within Functional Limits Alignment/Gaze Preference: Within Defined Limits Tracking/Visual Pursuits: Decreased smoothness of horizontal tracking;Decreased smoothness of vertical tracking;Other (comment) (slight jerkiness with tracking, but much improved) Convergence: Within functional limits Visual  Fields: No apparent deficits Additional Comments: Pt still with some increased light sensitivity as well increased dizziness with VOR cancellation test and standing balance with head movements.  Increased convergence ability with pt reporting that when he is scanning and reading the  letters are no longer running together. Perception  Perception: Within Functional Limits Praxis Praxis: Intact Cognition Overall Cognitive Status: Within Functional Limits for tasks assessed Arousal/Alertness: Awake/alert Attention: Focused;Sustained Focused Attention: Appears intact Memory: Appears intact Problem Solving: Appears intact Safety/Judgment: Appears intact Sensation Sensation Light Touch: Appears Intact Hot/Cold: Appears Intact Proprioception: Appears Intact Stereognosis: Appears Intact Additional Comments: Pt with intact light touch and proprioception in the LUE, but does report the right side of his face and arm sweating at times and feeling hotter. Coordination Gross Motor Movements are Fluid and Coordinated: Yes Fine Motor Movements are Fluid and Coordinated: Yes Coordination and Movement Description: BUE gross and FM coordination WFLs Motor  Motor Motor: Ataxia Motor - Discharge Observations: slight trunkial ataxia still present Mobility  Bed Mobility Bed Mobility: Supine to Sit;Sit to Supine Supine to Sit: Independent Sit to Supine: Independent Transfers Sit to Stand: Independent with assistive device Stand to Sit: Independent with assistive device  Trunk/Postural Assessment  Cervical Assessment Cervical Assessment: Exceptions to WFL (slight cervical protraction present with decreased AROM for cervical extension and lateral flexion) Thoracic Assessment Thoracic Assessment: Within Functional Limits Lumbar Assessment Lumbar Assessment: Within Functional Limits  Balance Balance Balance Assessed: Yes Static Sitting Balance Static Sitting - Balance Support: Feet  supported Static Sitting - Level of Assistance: 7: Independent Dynamic Sitting Balance Dynamic Sitting - Balance Support: During functional activity Dynamic Sitting - Level of Assistance: 7: Independent Static Standing Balance Static Standing - Balance Support: During functional activity Static Standing - Level of Assistance: 6: Modified independent (Device/Increase time) Dynamic Standing Balance Dynamic Standing - Balance Support: During functional activity;Left upper extremity supported;Right upper extremity supported Dynamic Standing - Level of Assistance: 5: Stand by assistance Extremity/Trunk Assessment RUE Assessment RUE Assessment: Within Functional Limits LUE Assessment LUE Assessment: Within Functional Limits   Tyreonna Czaplicki OTR/L 12/06/2019, 4:34 PM

## 2019-12-06 NOTE — Progress Notes (Signed)
[facility] PHYSICAL MEDICINE & REHABILITATION PROGRESS NOTE   Subjective/Complaints:  Patient slept poorly last night, discussed need for cardiology neurology follow-up.  Patient complaining of depression, discussed follow-up with neuropsychology as well ROS: Patient denies CP, SOB, + dizziness when turning head side to side Objective:   No results found. No results for input(s): WBC, HGB, HCT, PLT in the last 72 hours. Recent Labs    12/04/19 0553 12/06/19 0643  NA 141  --   K 4.2  --   CL 107  --   CO2 25  --   GLUCOSE 113*  --   BUN 20  --   CREATININE 1.25* 1.19  CALCIUM 9.8  --     Intake/Output Summary (Last 24 hours) at 12/06/2019 0944 Last data filed at 12/05/2019 1330 Gross per 24 hour  Intake 360 ml  Output --  Net 360 ml     Physical Exam: Vital Signs Blood pressure 125/87, pulse 92, temperature 98.4 F (36.9 C), resp. rate 16, height 6\' 3"  (1.905 m), weight 132.9 kg, SpO2 95 %.     General: No acute distress Mood and affect are appropriate Heart: Regular rate and rhythm no rubs murmurs or extra sounds Lungs: Clear to auscultation, breathing unlabored, no rales or wheezes Abdomen: Positive bowel sounds, soft nontender to palpation, nondistended Extremities: No clubbing, cyanosis, or edema Skin: No evidence of breakdown, no evidence of rash  Musculoskeletal: Full range of motion in all 4 extremities. No joint swelling No dysconjugate gaze or facial droop   Assessment/Plan: 1. 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  Stable for D/C today F/u PCP in 3-4 weeks F/u PM&R 2 weeks See D/C summary See D/C instructions  Care Tool:  Bathing  Bathing activity did not occur:  (N/A) Body parts bathed by patient: Right arm, Left arm, Chest, Abdomen, Front perineal area, Buttocks, Right upper leg, Face, Right lower leg, Left lower leg, Left upper leg         Bathing assist Assist Level: Supervision/Verbal cueing     Upper Body  Dressing/Undressing Upper body dressing   What is the patient wearing?: Pull over shirt    Upper body assist Assist Level: Set up assist    Lower Body Dressing/Undressing Lower body dressing    Lower body dressing activity did not occur: N/A What is the patient wearing?: Pants     Lower body assist Assist for lower body dressing: Set up assist     Toileting Toileting Toileting Activity did not occur (Clothing management and hygiene only): N/A (no void or bm)  Toileting assist Assist for toileting: Supervision/Verbal cueing     Transfers Chair/bed transfer  Transfers assist     Chair/bed transfer assist level: Contact Guard/Touching assist Chair/bed transfer assistive device:  (none)   Locomotion Ambulation   Ambulation assist      Assist level: Contact Guard/Touching assist Assistive device: Rollator Max distance: 392ft   Walk 10 feet activity   Assist     Assist level: Contact Guard/Touching assist Assistive device: Rollator   Walk 50 feet activity   Assist    Assist level: Contact Guard/Touching assist Assistive device: Rollator    Walk 150 feet activity   Assist Walk 150 feet activity did not occur: Safety/medical concerns  Assist level: Contact Guard/Touching assist Assistive device: Rollator    Walk 10 feet on uneven surface  activity   Assist Walk 10 feet on uneven surfaces activity did not occur: Safety/medical concerns  Wheelchair     Assist Will patient use wheelchair at discharge?: No (TBD but do not anticipate)             Wheelchair 50 feet with 2 turns activity    Assist            Wheelchair 150 feet activity     Assist          Blood pressure 125/87, pulse 92, temperature 98.4 F (36.9 C), resp. rate 16, height 6\' 3"  (1.905 m), weight 132.9 kg, SpO2 95 %.   Medical Problem List and Plan: 1.  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  Antithrombotics: -DVT/anticoagulation: Lovenox- discontinued: ambulating >175 feet             -antiplatelet therapy: Aspirin 81 mg daily and Plavix 75 mg daily x3 weeks then aspirin alone 3. Pain Management: Oxycodone as needed, tramadol as needed-adjusted, Fioricet as needed  See #11 4. Mood: Lexapro 10 mg daily Klonopin 0.5 mg as needed sleep  -pt stated he no longer wants lexapro: have held it for now  -insomnia: improved Saturday night             -antipsychotic agents: N/A 5. Neuropsych: This patient is?  Fully capable of making decisions on his own behalf. 6. Skin/Wound Care: Routine skin checks 7. Fluids/Electrolytes/Nutrition: Routine in and outs. Nl BMET 8.  PFO.  Follow-up outpatient Dr. Sunday for consideration of closure 9.  Hypertension.          Vitals:   12/05/19 2025 12/06/19 0437  BP: (!) 135/95 125/87  Pulse: (!) 105 92  Resp: 20 16  Temp: 98.4 F (36.9 C) 98.4 F (36.9 C)  SpO2: 98% 95%   Propanolol 20 3 times daily started on 7/10, increased to 40 on 7/11  Increase Norvasc to 10mg  on 7/12  Cozaar increased to 75mg  on 7/14  If systolic stays below 100 mmHg today , may reduce Cozaar to 50mg   10.  Hyperlipidemia.    Continue Lipitor 11. Persistent severe headaches-likely vascular.    Depakote DC'd  See #9  Topamax 25 twice daily started on 7/10, increased to 50mg  BID 7/13, will increase to 75mg  BID 7/15  Improved 7/20  12.  Sleep apnea.  Continue CPAP 13.  Mild transaminitis  LFTs mildly elevated on 7/8, labs ordered for Monday  Continue to monitor  LOS: 14 days A FACE TO FACE EVALUATION WAS PERFORMED  8/13 12/06/2019, 9:44 AM

## 2019-12-06 NOTE — Progress Notes (Signed)
Physical Therapy Discharge Summary  Patient Details  Name: [name] MRN: [id number] Date of Birth: October 30, 1970  Today's Date: 12/06/2019 PT Individual Time: 0950-1032 PT Individual Time Calculation (min): 42 min    Patient has met 9 of 9 long term goals due to improved activity tolerance, improved balance, improved postural control, increased strength, decreased pain, ability to compensate for deficits, improved attention, improved awareness and improved coordination.  Patient to discharge at an ambulatory level mod-I/Supervision using rollator. Patient's care partner is independent to provide the necessary physical assistance at discharge.  All goals met.  Recommendation:  Patient will benefit from ongoing skilled PT services in outpatient setting to continue to advance safe functional mobility, address ongoing impairments in dynamic standing balance, gait training with LRAD, vestibular and occular involvement in mobility, and minimize fall risk.  Equipment: Bariatric rollator - recommended to follow-up about AFO for hx of R foot drop during outpatient therapy  Reasons for discharge: treatment goals met and discharge from hospital  Patient/family agrees with progress made and goals achieved: Yes  Skilled Therapeutic Interventions/Progress Updates:  Pt received sitting EOB with his father present and pt agreeable to therapy session. Pt had received bariatric rollator - used throughout session in preparation for discharge. Sit<>stands using rollator mod-I throughout session. Gait training ~349f to ortho gym using rollator with supervision for these longer distances - only 1x to stop and rebalance himself in a busy environment. Simulated ambulatory car transfer using rollator with supervision - pt demos great safety awareness sitting then turning to place LEs in/out of the vehicle. Gait training ~168fup/down ramp, ~1045f2 over mulch, and on/off curb step using rollator all with  supervision - requires max cuing for AD management on curb and min cuing for AD management on ramp. Gait ~150f9f main gym using rollator with supervision. Ascended/descended 12 steps using R HR only to replicate home environment with primarily reciprocal stepping pattern with intermittent step-to pattern when performing dual-task of talking - supervision for safety but no unsteadiness noted. Educated pt on fall risk safety at home and when to call 911. Reviewed floor transfer technique - pt performed with supervision. Therapist educated pt on performing 2 static standing balance tasks of horizontal head turns and turning R/L to look over shoulder as HEP - educated on safe set-up of standing with back approximately 1foo36fway from corner walls of a room and his rollator locked in front of him - provided HEP printout. Gait training ~150ft 30f to room using rollator with supervision - continues to demo improving gait mechanics and gait speed with this AD. Pt reports no questions/concerns. Left seated EOB with needs in reach and his father present.  PT Discharge Precautions/Restrictions Precautions Precautions: Fall Precaution Comments: increased light sensitivity,truncal ataxia Restrictions Weight Bearing Restrictions: No Pain Pain Assessment Pain Scale: 0-10 Pain Score: 0-No pain Perception  Perception Perception: Within Functional Limits Praxis Praxis: Intact  Cognition Overall Cognitive Status: Within Functional Limits for tasks assessed Arousal/Alertness: Awake/alert Orientation Level: Oriented X4 Attention: Focused;Sustained Focused Attention: Appears intact Sustained Attention: Appears intact Problem Solving: Appears intact Safety/Judgment: Appears intact Sensation Light Touch: Appears Intact Proprioception: Appears Intact Gross Motor Movements are Fluid and Coordinated: Yes Fine Motor Movements are Fluid and Coordinated: Yes Coordination and Movement Description: gross motor  movements WFL Motor  Motor Motor: Ataxia Motor - Discharge Observations: slight truncal ataxia still present  Mobility Bed Mobility Bed Mobility: Supine to Sit;Sit to Supine Supine to Sit: Independent Sit to Supine: Independent Transfers  Transfers: Sit to Stand;Stand to [employer] Transfers Sit to Stand: Independent with assistive device Stand to Sit: Independent with assistive device Stand Pivot Transfers: Independent with assistive device Transfer (Assistive device): Rollator Locomotion  Gait Ambulation: Yes Gait Assistance: Supervision/Verbal cueing Gait Distance (Feet): 300 Feet Assistive device: Rollator Gait Gait: Yes Gait Pattern: Impaired Gait Pattern: Poor foot clearance - right;Wide base of support (hx chronic R foot drop) Stairs / Additional Locomotion Stairs: Yes Stairs Assistance: Supervision/Verbal cueing Stair Management Technique: One rail Right Number of Stairs: 12 Height of Stairs: 6 Ramp: Supervision/Verbal cueing Curb: Supervision/Verbal cueing Wheelchair Mobility Wheelchair Mobility: No  Trunk/Postural Assessment  Cervical Assessment Cervical Assessment: Exceptions to [facility] (continues to guard neck movement to avoid rotation as this provokes symptoms) Thoracic Assessment Thoracic Assessment: Within Functional Limits Lumbar Assessment Lumbar Assessment: Within Functional Limits Postural Control Postural Control: Within Functional Limits (using rollator)  Balance Balance Balance Assessed: Yes Static Sitting Balance Static Sitting - Balance Support: Feet supported Static Sitting - Level of Assistance: 7: Independent Dynamic Sitting Balance Dynamic Sitting - Balance Support: During functional activity Dynamic Sitting - Level of Assistance: 7: Independent Static Standing Balance Static Standing - Balance Support: Bilateral upper extremity supported Static Standing - Level of Assistance: 6: Modified independent (Device/Increase time) Dynamic  Standing Balance Dynamic Standing - Balance Support: During functional activity;Left upper extremity supported;Right upper extremity supported Dynamic Standing - Level of Assistance: 5: Stand by assistance Extremity Assessment      RLE Assessment RLE Assessment: Within Functional Limits Active Range of Motion (AROM) Comments: WFL General Strength Comments: WFL grossly 5/5 except for ankle DF with hx of foot drop LLE Assessment LLE Assessment: Within Functional Limits Active Range of Motion (AROM) Comments: WFL General Strength Comments: Grossly 5/5    Tawana Scale , PT, DPT, CSRS  12/06/2019, 8:02 AM

## 2019-12-06 NOTE — Discharge Instructions (Signed)
Inpatient Rehab Discharge Instructions  LEVELL TAVANO Discharge date and time: No discharge date for patient encounter.   Activities/Precautions/ Functional Status: Activity: activity as tolerated Diet: regular diet Wound Care: keep wound clean and dry Functional status:  ___ No restrictions     ___ Walk up steps independently ___ 24/7 supervision/assistance   ___ Walk up steps with assistance ___ Intermittent supervision/assistance  ___ Bathe/dress independently ___ Walk with walker     _x__ Bathe/dress with assistance ___ Walk Independently    ___ Shower independently ___ Walk with assistance    ___ Shower with assistance ___ No alcohol     ___ Return to work/school ________ COMMUNITY REFERRALS UPON DISCHARGE:      Outpatient: PT                  Agency: [facility] Phone: [phone]              Appointment Date/Time: Facility to Determine at Discharge  Medical Equipment/Items Ordered: Bariatric Rollater                                                 Agency/Supplier: [employer]  No driving smoking or alcohol  Continue Plavix and aspirin x1 more week then aspirin alone   My questions have been answered and I understand these instructions. I will adhere to these goals and the provided educational materials after my discharge from the hospital.  Patient/Caregiver Signature _______________________________ Date __________  Clinician Signature _______________________________________ Date __________  Please bring this form and your medication list with you to all your follow-up doctor's appointments.

## 2019-12-08 ENCOUNTER — Telehealth: Payer: Self-pay | Admitting: Registered Nurse

## 2019-12-08 NOTE — Telephone Encounter (Signed)
TC call placed , no answered. Left message to return the call.

## 2019-12-18 ENCOUNTER — Other Ambulatory Visit: Payer: Self-pay

## 2019-12-18 ENCOUNTER — Encounter: Payer: Self-pay | Admitting: Registered Nurse

## 2019-12-18 ENCOUNTER — Encounter: Payer: 59 | Attending: Registered Nurse | Admitting: Registered Nurse

## 2019-12-18 VITALS — BP 113/81 | HR 84 | Temp 98.0°F | Ht 75.0 in | Wt 290.6 lb

## 2019-12-18 DIAGNOSIS — I69319 Unspecified symptoms and signs involving cognitive functions following cerebral infarction: Secondary | ICD-10-CM | POA: Diagnosis present

## 2019-12-18 DIAGNOSIS — F4323 Adjustment disorder with mixed anxiety and depressed mood: Secondary | ICD-10-CM | POA: Diagnosis present

## 2019-12-18 DIAGNOSIS — I10 Essential (primary) hypertension: Secondary | ICD-10-CM | POA: Insufficient documentation

## 2019-12-18 DIAGNOSIS — E7849 Other hyperlipidemia: Secondary | ICD-10-CM | POA: Insufficient documentation

## 2019-12-18 DIAGNOSIS — I639 Cerebral infarction, unspecified: Secondary | ICD-10-CM | POA: Insufficient documentation

## 2019-12-18 NOTE — Patient Instructions (Signed)
Call Dr Maurice Small to schedule Hospital Follow Up Visit [phone] [address]

## 2019-12-18 NOTE — Progress Notes (Signed)
Subjective:    Patient ID: [name], male    DOB: Feb 04, 1971, 49 y.o.   MRN: [id number]  HPI: [name] is a 49 y.o. male who is here for hospital follow up of his  Ischemic Stroke: L PICA infarct embolic in setting of PFO S/P Craniotomy, Essential Hypertension and Hyperlipidemia. He was brought to the Emergency Room via EMS for complaints of dizziness, Nausea, Vomiting and diaphoresis. Also reported headache on 11/16/2019. Neurology Consulted and Neurosurgery  CT Angio Head: Neck  W or WO Contrast:  IMPRESSION: Evolving acute left cerebellar PICA territory infarction. No hydrocephalus or hemorrhage. CT Neck W or WO Contrast:   MR Brain: WO Contrast:  IMPRESSION: 4.1 cm acute left PICA territory ischemic infarct within the left cerebellar hemisphere. There is no significant posterior fossa mass effect at this time.  An additional punctate acute infarct is questioned within the left occipital lobe.  Otherwise unremarkable MRI appearance of the brain.  Mild ethmoid sinus mucosal thickening. [name] bilateral maxillary sinus mucous retention cysts.  No large vessel occlusion or hemodynamically significant stenosis. Left PICA origin is patent He underwent SUBOCCIPITAL DECOMPRESSIVE CRANIECTOMY, on 11/17/2019 by [name].   [name] will follow up with [name] cardiology regarding PFO, he has a scheduled outpatient appointment on 12/20/2019.   [name] admits he's feeling overwhelmed and depressed with new onset diagnosis of having a stroke, he is tearful at times he denies suicidal ideation or suicidal plan at this time. [name] states she's with [name] at all times, we discussed counseling. [name] denies referral to psychology at this time.  [name] states he has pain in his head ( headache), neck, bilateral hips and bilateral lower extremities. He rates his pain 5. He's scheduled for outpatient therapy at [facility].   [name] states  [name] was exposed to liquid CO2 on his job 7 days prior to his admission, she wanted to know if this had anything to do with his ischemic stroke, the above will be discussed with [name] and will give her a return call, she verbalizes understanding. This provider asked [name] if she discussed the above with the doctors in the hospital, she stated no. When her husband came home and his co-workers came to their home to  visit him, the above statement was brought up.   As stated  this will be discussed with [name], she verbalizes understanding.   [name] was admitted to inpatient rehabilitation on 11/22/2019 and discharged home on 12/06/2019.    Pain Inventory Average Pain 10 Pain Right Now 5 My pain is constant, dull and aching  In the last 24 hours, has pain interfered with the following? General activity 8 Relation with others 9 Enjoyment of life 10 What TIME of day is your pain at its worst? It just depends. Anytime. Sleep (in general) Poor  Pain is worse with: inactivity Pain improves with: rest, heat/ice and medication Relief from Meds: 4  Mobility use a cane use a walker how many minutes can you walk? 5 mins ability to climb steps?  yes do you drive?  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 Do you have any goals in this area?  yes  Neuro/Psych weakness numbness tremor tingling trouble walking spasms dizziness confusion depression anxiety suicidal thoughts  Prior Studies Any changes since last visit?  yes New Patient  Physicians involved in your care Any changes since last visit?  yes   History reviewed. No pertinent family history. Social History   Socioeconomic History  . Marital status: Married    Spouse name: Not on file  . Number of children: Not on file  . Years of  education: Not on file  . Highest education level: Not on file  Occupational History  . Not on file  Tobacco Use  . Smoking status: Never Smoker  . Smokeless tobacco: Never Used  Vaping Use  . Vaping Use: Former  Substance and Sexual Activity  . Alcohol use: Not Currently    Comment: quit 2006  . Drug use: No  . Sexual activity: Yes  Other Topics Concern  . Not on file  Social History Narrative   Right handed    Lives with wife    Social Determinants of Health   Financial Resource Strain:   . Difficulty of Paying Living Expenses:   Food Insecurity:   . Worried About Programme researcher, broadcasting/film/video in the Last Year:   . Barista in the Last Year:   Transportation Needs:   . Freight forwarder (Medical):   Marland Kitchen Lack of Transportation (Non-Medical):   Physical Activity:   . Days of Exercise per Week:   . Minutes of Exercise per Session:   Stress:   . Feeling of Stress :   Social Connections:   . Frequency of Communication with Friends and Family:   . Frequency of Social Gatherings with Friends and Family:   . Attends Religious Services:   . Active Member of Clubs or Organizations:   . Attends [name] Meetings:   Marland Kitchen Marital Status:    Past Surgical History:  Procedure Laterality Date  . BUBBLE STUDY  11/21/2019   Procedure: BUBBLE STUDY;  Surgeon: Henry Red, MD;  Location: [facility] ENDOSCOPY;  Service: Cardiovascular;;  . CARPAL TUNNEL RELEASE Left 09/07/2014   Procedure: LEFT CARPAL TUNNEL RELEASE;  Surgeon: [name], MD;  Location: [facility];  Service: Orthopedics;  Laterality: Left;  . CARPAL TUNNEL RELEASE Right 10/12/2014   Procedure: RIGHT CARPAL TUNNEL RELEASE;  Surgeon: [name], MD;  Location: [facility];  Service: Orthopedics;  Laterality: Right;  . KNEE ARTHROSCOPY Left   . SUBOCCIPITAL CRANIECTOMY CERVICAL LAMINECTOMY N/A 11/17/2019   Procedure: SUBOCCIPITAL DECOMPRESSIVE CRANIECTOMY;  Surgeon: Henry Pierini, MD;  Location: [facility];  Service: Neurosurgery;  Laterality: N/A;  . TEE WITHOUT CARDIOVERSION N/A 11/21/2019   Procedure: TRANSESOPHAGEAL ECHOCARDIOGRAM (TEE);  Surgeon: Henry Red, MD;  [facility];  Service: Cardiovascular;  Laterality: N/A;   Past Medical History:  Diagnosis Date  . Arthritis    neck  . Carpal tunnel syndrome on both sides 08/2014  . DDD (degenerative disc disease), lumbar   . History of kidney stones   . Hypertension    under control with med., has been on med. x 1 yr.  . Sleep apnea    CPAP but does not use   BP 113/81   Pulse 84   Temp 98 F (36.7 C)   Ht 6\' 3"  (1.905 m)   Wt 290 lb 9.6 oz (131.8 kg)   SpO2 97%   BMI 36.32 kg/m   Opioid Risk Score:   Fall Risk Score:  `1  Depression screen PHQ 2/9  Depression screen [facility] 2/9 12/18/2019  Decreased Interest  3  Down, Depressed, Hopeless 3  PHQ - 2 Score 6  Altered sleeping 3  Tired, decreased energy 3  Change in appetite 2  Feeling bad or failure about yourself  3  Trouble concentrating 2  Moving slowly or fidgety/restless 3  Suicidal thoughts 1  PHQ-9 Score 23   Review of Systems  Constitutional: Negative.   HENT: Negative.   Eyes: Negative.   Respiratory: Negative.   Cardiovascular: Negative.   Gastrointestinal: Negative.   Endocrine: Negative.   Genitourinary: Negative.   Musculoskeletal: Positive for back pain and gait problem.  Skin: Negative.   Allergic/Immunologic: Negative.   Neurological: Positive for tremors, weakness, numbness and headaches.  Psychiatric/Behavioral:       Depression, anxiety       Objective:   Physical Exam Vitals and nursing note reviewed.  Constitutional:      Appearance: Normal appearance.  Cardiovascular:     Rate and Rhythm: Normal rate and regular rhythm.     Pulses: Normal pulses.     Heart sounds: Normal heart sounds.  Pulmonary:     Effort: Pulmonary effort is normal.     Breath sounds: Normal breath sounds.    Musculoskeletal:     Cervical back: Normal range of motion and neck supple.     Comments: Normal Muscle Bulk and Muscle Testing Reveals:  Upper Extremities: Full ROM and Muscle Strength 5/5 Thoracic Paraspinal Tenderness: T-7-T-9 Lumbar Paraspinal Tenderness: L-3-L-5 Lower Extremities : Full ROM and Muscle Strength 5/5 Arises from Table with ease using walker for support Narrow Based Gait   Skin:    General: Skin is warm and dry.  Neurological:     Mental Status: He is alert and oriented to person, place, and time.  Psychiatric:        Mood and Affect: Mood normal.        Behavior: Behavior normal.           Assessment & Plan:  1. Ischemic Stroke: L PICA infarct embolic in setting of PFO S/P Craniotomy: He is attending Outpatient Therapy at [facility]. He has a scheduled appointment with Henry. Maurice [name] and Henry Pearlean Brownie.  2. Essential Hypertension: Continue current medication regimen. Continue to Monitor. PCP Following.  3. Hyperlipidemia. Continue current medication regimen. PCP Following.   of face to face patient care time was spent during this visit. All questions were encouraged and answered.  F/U in 4-6 weeks with [name]

## 2019-12-19 NOTE — Progress Notes (Signed)
Created in error

## 2019-12-20 ENCOUNTER — Ambulatory Visit: Payer: 59 | Admitting: Cardiovascular Disease

## 2019-12-20 ENCOUNTER — Encounter: Payer: Self-pay | Admitting: Cardiovascular Disease

## 2019-12-20 ENCOUNTER — Other Ambulatory Visit: Payer: Self-pay

## 2019-12-20 VITALS — BP 118/88 | HR 86 | Ht 75.0 in | Wt 291.2 lb

## 2019-12-20 DIAGNOSIS — Q211 Atrial septal defect: Secondary | ICD-10-CM

## 2019-12-20 DIAGNOSIS — Q2112 Patent foramen ovale: Secondary | ICD-10-CM

## 2019-12-20 NOTE — Progress Notes (Signed)
Cardiology Office Note:    Date:  12/20/2019   ID:  Henry MattocksMichael J [name], DOB 31-May-1970, MRN [id number]  PCP:  Henry HazelMiller, Lisa, MD  [facility] Cardiologist:  No primary care provider on file.  [facility] Electrophysiologist:  None   Referring MD: Henry HazelMiller, Lisa, MD   Chief Complaint  Patient presents with  . PFO consult    History of Present Illness:    Henry SchwartzMichael J [name] is a 49 y.o. male referred by Dr Henry BrownieSethi for evaluation of PFO. The patient was in his normal state of health on 11/16/19 when he developed dizziness and gait abnormality, prompting ER evaluation. He was diagnosed with a left PICA territory infarction and a questionable punctate infarct in the left occipital lobe. CTA studies showed no large vessel atherosclerosis or stenosis. Echo was normal. TCD bubble study was positive for [name] grade 2-3 shunt at rest. TEE showed a positive bubble study but no clear color flow across the interatrial septum. The patient ultimately developed obstructive hydrocephalus and underwent suboccipital craniotomy on 7/2. He ultimately [facility] to [facility] and has now been discharged home.   The patient is here with his wife today. He works as a Psychologist, occupationalwelder. He reports that he was feeling very poorly for 7-10 days prior to his stroke. He complained of profuse sweating, headaches, chest pain, and episodes of anger that were very 'out of character' for him. He and his wife feel like he had overexposure to CO2 as part of his job. He would feel better when he would step away and get fresh air.  He denies exertional chest pain or shortness of breath at present.  He continues to not feel very well and feels like he is having a slow recovery.  He has upcoming physical therapy and Occupational Therapy consultations to establish an outpatient regimen.  Past Medical History:  Diagnosis Date  . Arthritis    neck  . Carpal tunnel syndrome on both sides 08/2014  . DDD (degenerative disc disease), lumbar   .  History of kidney stones   . Hypertension    under control with med., has been on med. x 1 yr.  . Sleep apnea    CPAP but does not use    Past Surgical History:  Procedure Laterality Date  . BUBBLE STUDY  11/21/2019   Procedure: BUBBLE STUDY;  Surgeon: Jodelle Redhristopher, Bridgette, MD;  [facility];  Service: Cardiovascular;;  . CARPAL TUNNEL RELEASE Left 09/07/2014   Procedure: LEFT CARPAL TUNNEL RELEASE;  Surgeon: Dominica SeverinWilliam Gramig, MD;  Location: [facility];  Service: Orthopedics;  Laterality: Left;  . CARPAL TUNNEL RELEASE Right 10/12/2014   Procedure: RIGHT CARPAL TUNNEL RELEASE;  Surgeon: Dominica SeverinWilliam Gramig, MD;  Location: [facility];  Service: Orthopedics;  Laterality: Right;  . KNEE ARTHROSCOPY Left   . SUBOCCIPITAL CRANIECTOMY CERVICAL LAMINECTOMY N/A 11/17/2019   Procedure: SUBOCCIPITAL DECOMPRESSIVE CRANIECTOMY;  Surgeon: Jadene [name], Henry A, MD;  [facility];  Service: Neurosurgery;  Laterality: N/A;  . TEE WITHOUT CARDIOVERSION N/A 11/21/2019   Procedure: TRANSESOPHAGEAL ECHOCARDIOGRAM (TEE);  Surgeon: Jodelle Redhristopher, Bridgette, MD;  [facility];  Service: Cardiovascular;  Laterality: N/A;    Current Medications: Current Meds  Medication Sig  . acetaminophen (TYLENOL) 325 MG tablet Take 2 tablets (650 mg total) by mouth every 6 (six) hours as needed for mild pain or fever (for mild pain or fever >/=99.5).  Marland Kitchen. amLODipine (NORVASC) 10 MG tablet Take 1 tablet (10 mg total) by mouth daily.  .Marland Kitchen  aspirin EC 81 MG EC tablet Take 1 tablet (81 mg total) by mouth daily. Swallow whole.  Marland Kitchen atorvastatin (LIPITOR) 80 MG tablet Take 1 tablet (80 mg total) by mouth daily.  . butalbital-acetaminophen-caffeine (FIORICET) 50-325-40 MG tablet Take 2 tablets by mouth every 6 (six) hours as needed for headache.  . clonazePAM (KLONOPIN) 0.5 MG tablet Take 1 tablet (0.5 mg total) by mouth as needed (sleep).  . docusate sodium (COLACE) 100 MG capsule Take 1 capsule  (100 mg total) by mouth 2 (two) times daily. (Patient taking differently: Take 100 mg by mouth daily as needed. )  . losartan (COZAAR) 25 MG tablet Take 3 tablets (75 mg total) by mouth daily.  . meclizine (ANTIVERT) 25 MG tablet Take 1 tablet (25 mg total) by mouth 3 (three) times daily as needed for dizziness or nausea.  . propranolol (INDERAL) 40 MG tablet Take 1 tablet (40 mg total) by mouth 3 (three) times daily.  Marland Kitchen topiramate (TOPAMAX) 25 MG tablet Take 3 tablets (75 mg total) by mouth 2 (two) times daily.  . traMADol (ULTRAM) 50 MG tablet Take 2 tablets (100 mg total) by mouth every 6 (six) hours as needed for moderate pain.  . [DISCONTINUED] clopidogrel (PLAVIX) 75 MG tablet Take 1 tablet (75 mg total) by mouth daily.     Allergies:   Patient has no known allergies.   Social History   Socioeconomic History  . Marital status: Married    Spouse name: Not on file  . Number of children: Not on file  . Years of education: Not on file  . Highest education level: Not on file  Occupational History  . Not on file  Tobacco Use  . Smoking status: Never Smoker  . Smokeless tobacco: Never Used  Vaping Use  . Vaping Use: Former  Substance and Sexual Activity  . Alcohol use: Not Currently    Comment: quit 2006  . Drug use: No  . Sexual activity: Yes  Other Topics Concern  . Not on file  Social History Narrative   Right handed    Lives with wife    Social Determinants of Health   Financial Resource Strain:   . Difficulty of Paying Living Expenses:   Food Insecurity:   . Worried About Programme researcher, broadcasting/film/video in the Last Year:   . Barista in the Last Year:   Transportation Needs:   . Freight forwarder (Medical):   Marland Kitchen Lack of Transportation (Non-Medical):   Physical Activity:   . Days of Exercise per Week:   . Minutes of Exercise per Session:   Stress:   . Feeling of Stress :   Social Connections:   . Frequency of Communication with Friends and Family:   . Frequency  of Social Gatherings with Friends and Family:   . Attends Religious Services:   . Active Member of Clubs or Organizations:   . Attends Banker Meetings:   Marland Kitchen Marital Status:      Family History: The patient's family history is not on file.  ROS:   Please see the history of present illness.    All other systems reviewed and are negative.  EKGs/Labs/Other Studies Reviewed:    The following studies were reviewed today: TCD Bubble Study: Summary:     A vascular evaluation was performed. The right middle cerebral artery was  studied. An IV was inserted into the patient's left AC fossa. Verbal  informed consent was obtained.  Mild to moderate HITS heard at rest. Unable to perform Valsalva due to  patient condition.   PFO size: [name] degree III   TEE: IMPRESSIONS    1. Left ventricular ejection fraction, by estimation, is 60 to 65%. The  left ventricle has normal function. The left ventricle has no regional  wall motion abnormalities.  2. Right ventricular systolic function is normal. The right ventricular  size is normal.  3. No left atrial/left atrial appendage thrombus was detected.  4. The mitral valve is normal in structure. Trivial mitral valve  regurgitation. No evidence of mitral stenosis.  5. The aortic valve is tricuspid. Aortic valve regurgitation is trivial.  No aortic stenosis is present.  6. There is mild (Grade II) plaque involving the descending aorta.  7. No shunt seen by color flow doppler. Agitated saline contrast bubble  study was positive with shunting observed within 3-6 cardiac cycles  suggestive of interatrial shunt.   Conclusion(s)/Recommendation(s): Study positive for intra-atrial flow  communication (positive bubble study). Highly mobile intra-atrial septum,  but no clear PFO or ASD color flow seen despite multiple angles/windows.  Recommend consideration of additional  outpatient imaging (cardiac MRI vs. Cardiac CT)  to further evaluation  anatomy prior to consideration of transcatheter closure.   MRI Brain: IMPRESSION: 4.1 cm acute left PICA territory ischemic infarct within the left cerebellar hemisphere. There is no significant posterior fossa mass effect at this time.  An additional punctate acute infarct is questioned within the left occipital lobe.  Otherwise unremarkable MRI appearance of the brain.  Mild ethmoid sinus mucosal thickening. Small bilateral maxillary sinus mucous retention cysts.  EKG:  EKG is not ordered today.  The ekg ordered 11/16/19 demonstrates NSR with nonspecific IVCD  Recent Labs: 11/16/2019: Magnesium 1.7; TSH 1.525 11/23/2019: Hemoglobin 14.6; Platelets 227 12/04/2019: ALT 206; BUN 20; Potassium 4.2; Sodium 141 12/06/2019: Creatinine, Ser 1.19  Recent Lipid Panel    Component Value Date/Time   CHOL 181 11/16/2019 1623   TRIG 122 11/20/2019 0436   HDL 40 (L) 11/16/2019 1623   CHOLHDL 4.5 11/16/2019 1623   VLDL 7 11/16/2019 1623   LDLCALC 134 (H) 11/16/2019 1623    Physical Exam:    VS:  BP 118/88   Pulse 86   Ht 6\' 3"  (1.905 m)   Wt 291 lb 3.2 oz (132.1 kg)   BMI 36.40 kg/m     Wt Readings from Last 3 Encounters:  12/20/19 291 lb 3.2 oz (132.1 kg)  12/18/19 290 lb 9.6 oz (131.8 kg)  12/06/19 292 lb 15.9 oz (132.9 kg)     GEN:  Well nourished, well developed ambulating with a walker, in no acute distress HEENT: Normal NECK: No JVD; No carotid bruits LYMPHATICS: No lymphadenopathy CARDIAC: RRR, no murmurs, rubs, gallops RESPIRATORY:  Clear to auscultation without rales, wheezing or rhonchi  ABDOMEN: Soft, non-tender, non-distended MUSCULOSKELETAL:  No edema; No deformity  SKIN: Warm and dry NEUROLOGIC:  Alert and oriented x 3 PSYCHIATRIC:  Normal affect   ASSESSMENT:    1. PFO with atrial septal aneurysm    PLAN:    In order of problems listed above:  1. I reviewed the association of PFO and cryptogenic stroke with the patient and his  wife today.  I reviewed extensive hospital records and carefully reviewed his TEE images.  Transcranial Doppler showed [name] grade 2-3 right to left shunt.  TEE also reported a positive bubble study, but the images were not captured.  On reviewing the 2D  images, they are clearly suggestive of PFO but there is no color flow seen through the PFO tunnel.  I told the patient that I feel there is a high likelihood that he truly does have a PFO that would be amenable to transcatheter closure.  With that said, he does not appear to have a large PFO even with extensive interrogation of the septum on TEE imaging.  We reviewed clinical trial data that has demonstrated statistically significant reduction in recurrent stroke with transcatheter PFO closure versus medical therapy.  However, the patient's occupational exposure may have played some role in his stroke.  I advised them that I do not have any expertise in this area and I am not sure of the impact that CO2 gas exposure could have played.  I think it is reasonable to continue with his current medical therapy, work with physical and occupational therapy, and follow-up with Dr. Pearlean Brownie as scheduled in September.  I did review the PFO closure procedure with the patient and his wife.  I explained the risks of the procedure and demonstrated how the PFO device is implanted.  If he decided to pursue PFO closure in the future, I explained that I would perform a careful intracardiac echo study with agitated saline to confirm the presence of a clinically significant PFO.  If this is demonstrated then I would proceed with transcatheter closure in that same setting.  I would be happy to see him in the future if he decides that transcatheter PFO closure is something he would like to pursue.  At this time he prefers ongoing medical therapy.   Medication Adjustments/Labs and Tests Ordered: Current medicines are reviewed at length with the patient today.  Concerns regarding  medicines are outlined above.  No orders of the defined types were placed in this encounter.  No orders of the defined types were placed in this encounter.   Patient Instructions  Please call us when/if you want to proceed with closure! Orpha Bur, RN: [phone]    Signed, [name], MD  12/20/2019 9:01 AM    [facility]

## 2019-12-20 NOTE — Patient Instructions (Signed)
Please call us when/if you want to proceed with closure! Orpha Bur, RN: [phone]

## 2019-12-21 NOTE — Telephone Encounter (Signed)
Patient seen in clinic hospital f/u

## 2019-12-22 ENCOUNTER — Telehealth: Payer: Self-pay

## 2019-12-22 ENCOUNTER — Ambulatory Visit: Payer: 59 | Admitting: Physical Therapy

## 2019-12-22 MED ORDER — MECLIZINE HCL 25 MG PO TABS: 25.0000 mg | ORAL_TABLET | Freq: Three times a day (TID) | ORAL | 0 refills | Status: DC | PRN

## 2019-12-22 MED ORDER — PROPRANOLOL HCL 40 MG PO TABS: 40.0000 mg | ORAL_TABLET | Freq: Three times a day (TID) | ORAL | 0 refills | Status: DC

## 2019-12-22 MED ORDER — TOPIRAMATE 25 MG PO TABS: 75.0000 mg | ORAL_TABLET | Freq: Two times a day (BID) | ORAL | 0 refills | Status: DC

## 2019-12-22 MED ORDER — ATORVASTATIN CALCIUM 80 MG PO TABS: 80.0000 mg | ORAL_TABLET | Freq: Every day | ORAL | 0 refills | Status: DC

## 2019-12-22 MED ORDER — AMLODIPINE BESYLATE 10 MG PO TABS: 10.0000 mg | ORAL_TABLET | Freq: Every day | ORAL | 0 refills | Status: DC

## 2019-12-22 MED ORDER — CLONAZEPAM 0.5 MG PO TABS: 0.5000 mg | ORAL_TABLET | ORAL | 0 refills | Status: DC | PRN

## 2019-12-22 MED ORDER — LOSARTAN POTASSIUM 25 MG PO TABS: 75.0000 mg | ORAL_TABLET | Freq: Every day | ORAL | 0 refills | Status: DC

## 2019-12-22 NOTE — Telephone Encounter (Signed)
Patient wife called need refill on medication. Called wife back and she was out and didn't have list. I told her I would call back in the morning.

## 2019-12-22 NOTE — Telephone Encounter (Signed)
Called wife back and she states she did call the PCP since I did tell her that some of these medications are maintenance medication and would need to be monitored by PCP. Unfortunately patient does not have an appt with PCP until 8/13 but is out of medication now.  Patient needs refill on Amlodipine, Atorvastatin, Clonazepam, Losartan Potassium, Meclizine, Propranolol and Topiramate.

## 2019-12-25 ENCOUNTER — Ambulatory Visit: Payer: 59 | Attending: Physician Assistant

## 2019-12-25 ENCOUNTER — Telehealth: Payer: Self-pay | Admitting: Neurology

## 2019-12-25 ENCOUNTER — Other Ambulatory Visit: Payer: Self-pay

## 2019-12-25 VITALS — BP 125/85 | HR 74

## 2019-12-25 DIAGNOSIS — I69351 Hemiplegia and hemiparesis following cerebral infarction affecting right dominant side: Secondary | ICD-10-CM | POA: Insufficient documentation

## 2019-12-25 DIAGNOSIS — R2681 Unsteadiness on feet: Secondary | ICD-10-CM | POA: Diagnosis present

## 2019-12-25 DIAGNOSIS — I69318 Other symptoms and signs involving cognitive functions following cerebral infarction: Secondary | ICD-10-CM | POA: Diagnosis present

## 2019-12-25 DIAGNOSIS — M6281 Muscle weakness (generalized): Secondary | ICD-10-CM | POA: Diagnosis present

## 2019-12-25 DIAGNOSIS — M21371 Foot drop, right foot: Secondary | ICD-10-CM | POA: Insufficient documentation

## 2019-12-25 DIAGNOSIS — R41842 Visuospatial deficit: Secondary | ICD-10-CM | POA: Diagnosis present

## 2019-12-25 DIAGNOSIS — R2689 Other abnormalities of gait and mobility: Secondary | ICD-10-CM | POA: Insufficient documentation

## 2019-12-25 DIAGNOSIS — R278 Other lack of coordination: Secondary | ICD-10-CM | POA: Insufficient documentation

## 2019-12-25 NOTE — Telephone Encounter (Signed)
Spoke with wife who stated patient feels like he is falling, has developed some new symptoms. They requested sooner hospital FU. I rescheduled for tomorrow. She understands to bring insurance cards, med list, wear masks, check in 30 minutes early.

## 2019-12-25 NOTE — Telephone Encounter (Signed)
[name] - This pt wanted to get in earlier because the Pt feels like he is falling. The wife would like you to call her so she can explain his issues to try to get in earlier then scheduled 9/22 appt. Thank you

## 2019-12-25 NOTE — Therapy (Signed)
[facility] [address] [place], [place], [address] Phone: [phone]   Fax:  [phone]  Physical Therapy Evaluation  Patient Details  Name: Henry SchwartzMichael J [name] MRN: [id number] Date of Birth: December 19, 1970 Referring Provider (PT): Referred by: Mariam Dollaraniel Angiulli, [name], being followed by: Claudette LawsAndrew Kirsteins, MD   Encounter Date: 12/25/2019   PT End of Session - 12/25/19 0953    Visit Number 1    Number of Visits 17    Date for PT Re-Evaluation 03/24/20   POC for 8 weeks, Cert for 90 days   Authorization Type [employer]    PT Start Time [phone]    PT Stop Time 0802    PT Time Calculation (min) 45 min    Equipment Utilized During Treatment Gait belt    Activity Tolerance Patient tolerated treatment well    Behavior During Therapy WFL for tasks assessed/performed           Past Medical History:  Diagnosis Date  . Arthritis    neck  . Carpal tunnel syndrome on both sides 08/2014  . DDD (degenerative disc disease), lumbar   . History of kidney stones   . Hypertension    under control with med., has been on med. x 1 yr.  . Sleep apnea    CPAP but does not use    Past Surgical History:  Procedure Laterality Date  . BUBBLE STUDY  11/21/2019   Procedure: BUBBLE STUDY;  Surgeon: Jodelle Redhristopher, Bridgette, MD;  [facility];  Service: Cardiovascular;;  . CARPAL TUNNEL RELEASE Left 09/07/2014   Procedure: LEFT CARPAL TUNNEL RELEASE;  Surgeon: Dominica SeverinWilliam Gramig, MD;  Location: [facility];  Service: Orthopedics;  Laterality: Left;  . CARPAL TUNNEL RELEASE Right 10/12/2014   Procedure: RIGHT CARPAL TUNNEL RELEASE;  Surgeon: Dominica SeverinWilliam Gramig, MD;  Location: [facility];  Service: Orthopedics;  Laterality: Right;  . KNEE ARTHROSCOPY Left   . SUBOCCIPITAL CRANIECTOMY CERVICAL LAMINECTOMY N/A 11/17/2019   Procedure: SUBOCCIPITAL DECOMPRESSIVE CRANIECTOMY;  Surgeon: Jadene Pierinistergard, Thomas A, MD;  [facility];  Service: Neurosurgery;  Laterality: N/A;  . TEE WITHOUT CARDIOVERSION N/A 11/21/2019   Procedure: TRANSESOPHAGEAL ECHOCARDIOGRAM (TEE);  Surgeon: Jodelle Redhristopher, Bridgette, MD;  [facility];  Service: Cardiovascular;  Laterality: N/A;    Vitals:   12/25/19 0953  BP: 125/85  Pulse: 74      Subjective Assessment - 12/25/19 0722    Subjective Patient went to ED on 11/16/19 due to complaints of vertigo and some gait difficultes. Went to inpatient rehab, and was discharge home. Patient and wife reports that things has been going well. No falls since being home. Patient reports been having headaches and describes that have been throbbing.    Patient is accompained by: Family member   Wife   Pertinent History hypertension, hyperlipidemia, depression/anxiety, degenerative disc disease in lumbar region, obesity, sleep apnea    Limitations Walking;Standing    Patient Stated Goals Back to Normal, Do the things I did prior    Currently in Pain? No/denies              [facility] - 12/25/19 [id number]      Assessment   Medical Diagnosis L PICA Infarct    Referring Provider (PT) Referred by: Mariam Dollaraniel Angiulli, [name], being followed by: Claudette LawsAndrew Kirsteins, MD    Onset Date/Surgical Date 11/16/19    Hand Dominance Right    Prior Therapy Inpatient Rehab.       Precautions  Precautions Fall    Precaution Comments Patient reporting diplopia    Required Braces or Orthoses Other Brace/Splint    Other Brace/Splint Patient reporting that they have an appointment with [facility] set up to obtain R AFO. Patient and wife reporting they began the process during inpatient rehab stay.       Balance Screen   Has the patient fallen in the past 6 months No    Has the patient had a decrease in activity level because of a fear of falling?  Yes    Is the patient reluctant to leave their home because of a fear of falling?  Yes      Home Environment   Living Environment Private residence     Living Arrangements Spouse/significant other    Available Help at Discharge Family    Type of Home House    Home Access Stairs to enter    Entrance Stairs-Number of Steps 2    Entrance Stairs-Rails Right;Left;Can reach both    Home Layout One level    Home Equipment Walker - 4 wheels;Grab bars - toilet;Grab bars - tub/shower;Tub bench      Prior Function   Level of Independence Independent    Vocation Full time employment    Engineer, drilling at [employer]    Leisure Works on [employer]   Overall Cognitive Status Within Functional Limits for tasks assessed    Behaviors Lability;Other (comment)   Patient frustrated with current status     Sensation   Light Touch Impaired by gross assessment    Additional Comments Patient able to detect light touch on RLE, but reports decreased sensation on RLE vs. LLE      Coordination   Gross Motor Movements are Fluid and Coordinated Yes    Fine Motor Movements are Fluid and Coordinated Yes    Coordination and Movement Description WFL for BLE's       ROM / Strength   AROM / PROM / Strength AROM;Strength      AROM   Overall AROM  Deficits    Overall AROM Comments R DF AROM decreased due to strength deficits      Strength   Overall Strength Deficits    Strength Assessment Site Hip;Knee;Ankle    Right/Left Hip Right;Left    Right Hip Flexion 3+/5    Right Hip ABduction 4/5    Right Hip ADduction 4/5    Left Hip Flexion 4+/5    Left Hip ABduction 4+/5    Left Hip ADduction 4+/5    Right/Left Knee Right;Left    Right Knee Flexion 4-/5    Right Knee Extension 4-/5    Left Knee Flexion 4+/5    Left Knee Extension 4+/5    Right/Left Ankle Right;Left    Right Ankle Dorsiflexion 1/5    Left Ankle Dorsiflexion 4-/5      Bed Mobility   Bed Mobility Supine to Sit;Sit to Supine   all per patient reports   Supine to Sit Independent   pt reports vertigo symptoms   Sit to Supine Independent      Transfers     Transfers Sit to Stand;Stand to Sit    Sit to Stand 4: Min guard    Five time sit to stand comments  49.56 from standard height chair with UE support    Stand to Sit 4: Min guard      Ambulation/Gait   Ambulation/Gait Yes  Ambulation/Gait Assistance 4: Min guard    Ambulation/Gait Assistance Details PT providing CGA to patient during ambulation for safety. Patient demo overall slow gait speed with ambulation, decreased endurance require rest breaks, and often needing to stop with ambulation    Ambulation Distance (Feet) 100 Feet    Assistive device Rollator    Gait Pattern Step-through pattern;Decreased step length - left;Decreased stance time - right;Decreased dorsiflexion - right;Right steppage;Wide base of support;Poor foot clearance - right    Ambulation Surface Level;Indoor    Gait velocity 47.50 secs = 0.69 ft/sec      Standardized Balance Assessment   Standardized Balance Assessment Timed Up and Go Test      Timed Up and Go Test   TUG Normal TUG    Normal TUG (seconds) 35.84    TUG Comments with rollator                Objective measurements completed on examination: See above findings.       PT Education - 12/25/19 0727    Education Details Educated on POC and evaluations findings    Person(s) Educated Patient    Methods Explanation    Comprehension Verbalized understanding            PT Short Term Goals - 12/25/19 1010      PT SHORT TERM GOAL #1   Title patient will be independent with initial HEP for strengthening/balance (All STG's Due: 01/22/20)    Baseline no HEP established    Time 4    Period Weeks    Status New    Target Date 01/22/20      PT SHORT TERM GOAL #2   Title Patient will demo ability to ambulate >500 ft on indoor level surfaces with LRAD and supervision to demo improved household mobility    Baseline 100, CGA with Rollator    Time 4    Period Weeks    Status New      PT SHORT TERM GOAL #3   Title Patient will improve TUG  score w/ LRAD to </= 25 seconds to demo improved functional mobility    Baseline 35.84 secs    Time 4    Period Weeks    Status New      PT SHORT TERM GOAL #4   Title Patient will improve 5x sit <> stand to </= 30 seconds to demonstrate improved functional strength    Baseline 49.56 secs w/ UE support    Time 4    Period Weeks    Status New      PT SHORT TERM GOAL #5   Title Berg Balance to be assessed and LTG to be set as appropriate    Baseline baseline not established    Time 4    Period Weeks    Status New             PT Long Term Goals - 12/25/19 1013      PT LONG TERM GOAL #1   Title Patient will be independent with final HEP for strengthening/balance (All LTG's due: 02/19/20)    Baseline no HEP established    Time 8    Period Weeks    Status New    Target Date 02/19/20      PT LONG TERM GOAL #2   Title Patient will demonstrate ability to ambulate >1000 on indoor/outdoor surfaces with LRAD and supervision for improved community mobility    Baseline 100 ft CGA    Time  8    Period Weeks    Status New      PT LONG TERM GOAL #3   Title Patient will improve gait speed to >/= 2.0 ft/sec  with LRAD to demonstrate improved community ambulation    Baseline 0.69 ft/sec    Time 8    Period Weeks    Status New      PT LONG TERM GOAL #4   Title Patient will improve 5x sit <> stand to </= 20 secs to demonstrate improved functional mobility    Baseline 49.56 secs    Time 8    Period Weeks    Status New      PT LONG TERM GOAL #5   Title Patient will improve Tug to </= 15 seconds with LRAD to demo improved mobility and reduced fall risk    Baseline 35.84 secs    Time 8    Period Weeks    Status New      Additional Long Term Goals   Additional Long Term Goals Yes      PT LONG TERM GOAL #6   Title LTG to be set for [employer] as appropriate    Baseline baseline not established    Time 8    Period Weeks    Status New                  Plan -  12/25/19 1003    Clinical Impression Statement Patient is a 49 y.o. male that was referred to Neuro OPPT services for a L PICA infarct. Patient also had suboccipital hydrocephalus with brainstem compression. Pt underwent suboccipital craniotomy on 11/17/19. Pt's PMH is significant for the following: hypertension, hyperlipidemia, depression/anxiety, degenerative disc disease in lumbar region, obesity, and sleep apnea. Upon evaluation, patient presents with the following impairments: impaired sensation, decreased overall strength on R side, foot drop on R, abnormal gait, decreased balance, and increased risk for falls. Patient presents to be at high risk for falls with TUG time of 35.84 secs and gait speed of 0.69 ft/sec. Patient will benefit from skilled PT services to address impairments listed above, reduce risk for falls, and maximize functional mobility.    Personal Factors and Comorbidities Comorbidity 3+    Comorbidities hypertension, hyperlipidemia, depression/anxiety, degenerative disc disease in lumbar region, obesity, sleep apnea    Examination-Activity Limitations Bend;Transfers;Stairs;Stand;Lift    Examination-Participation Restrictions Occupation;Yard Work;Community Activity;Driving    Stability/Clinical Decision Making Evolving/Moderate complexity    Clinical Decision Making Moderate    Rehab Potential Good    PT Frequency 2x / week    PT Duration 8 weeks    PT Treatment/Interventions ADLs/Self Care Home Management;Aquatic Therapy;Cryotherapy;Electrical Stimulation;Moist Heat;DME Instruction;Gait training;Stair training;Functional mobility training;Therapeutic activities;Therapeutic exercise;Balance training;Neuromuscular re-education;Patient/family education;Orthotic Fit/Training;Manual techniques;Passive range of motion;Vestibular    PT Next Visit Plan Assess Berg Balance and update Goals; Initiate HEP (seated strengthening), Assess convergence/peripheral vision.  Potential referral to  neuropsychologist?    Recommended Other Services Occupational Therapy, Neuropsychology    Consulted and Agree with Plan of Care Patient;Family member/caregiver    Family Member Consulted Wife           Patient will benefit from skilled therapeutic intervention in order to improve the following deficits and impairments:  Abnormal gait, Decreased balance, Decreased endurance, Difficulty walking, Impaired sensation, Dizziness, Decreased range of motion, Decreased activity tolerance, Decreased knowledge of use of DME, Decreased strength, Pain  Visit Diagnosis: Hemiplegia and hemiparesis following cerebral infarction affecting right dominant side (HCC)  Muscle  weakness (generalized)  Other abnormalities of gait and mobility  Unsteadiness on feet  Foot drop, right     Problem List Patient Active Problem List   Diagnosis Date Noted  . Transaminitis   . PFO (patent foramen ovale) 11/22/2019  . Headache 11/22/2019  . Cerebral edema (HCC) 11/22/2019  . Chest pain 11/22/2019  . Hyperlipidemia LDL goal <70 11/22/2019  . OSA (obstructive sleep apnea) 11/22/2019  . 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 Vascular headache   . Dyslipidemia   . Hypertension   . Intractable nausea and vomiting   . Hypokalemia   . Depression with anxiety   . Ischemic stroke (HCC) - L PICA infarct embolic in setting of PFO s/p crani   . Lumbar spondylolysis 07/31/2019  . Lumbar radiculopathy 04/07/2019  . Right foot drop 04/07/2019  . Backache 07/05/2017  . DDD (degenerative disc disease), lumbar 07/05/2017  . Spinal stenosis of lumbar region 07/05/2017    Tempie Donning, PT, DPT 12/25/2019, 10:19 AM  [facility] [address] [place], [place], [address] Phone: [phone]   Fax:  [phone]  Name: [name] MRN: [id number] Date of Birth: October 19, 1970

## 2019-12-26 ENCOUNTER — Encounter: Payer: Self-pay | Admitting: Neurology

## 2019-12-26 ENCOUNTER — Ambulatory Visit: Payer: 59 | Admitting: Neurology

## 2019-12-26 ENCOUNTER — Ambulatory Visit: Payer: 59 | Admitting: Occupational Therapy

## 2019-12-26 VITALS — BP 125/88 | HR 72 | Ht 75.0 in | Wt 290.0 lb

## 2019-12-26 DIAGNOSIS — I639 Cerebral infarction, unspecified: Secondary | ICD-10-CM | POA: Diagnosis not present

## 2019-12-26 DIAGNOSIS — I63442 Cerebral infarction due to embolism of left cerebellar artery: Secondary | ICD-10-CM

## 2019-12-26 MED ORDER — TOPIRAMATE 100 MG PO TABS: 100.0000 mg | ORAL_TABLET | Freq: Two times a day (BID) | ORAL | 3 refills | Status: DC

## 2019-12-26 NOTE — Patient Instructions (Signed)
I had a long d/w patient and his wife about his recent cryptogenic  stroke, risk for recurrent stroke/TIAs, personally independently reviewed imaging studies and stroke evaluation results and answered questions.Continue aspirin 81 mg daily  for secondary stroke prevention and maintain strict control of hypertension with blood pressure goal below 130/90, diabetes with hemoglobin A1c goal below 6.5% and lipids with LDL cholesterol goal below 70 mg/dL. I also advised the patient to eat a healthy diet with plenty of whole grains, cereals, fruits and vegetables, exercise regularly and maintain ideal body weight he was encouraged to use his wheeled walker at all times and we discussed fall safety precautions.  Continue outpatient physical and occupational therapy.  He was advised to consider elective endovascular PFO closure and follow-up with Dr. [name] for the same.  I also counseled him to quit using marijuana and cigarettes.  Followup in the future with my nurse practitioner [name] in 3 months or call earlier if necessary..  Fall Prevention in the Home, Adult Falls can cause injuries. They can happen to people of all ages. There are many things you can do to make your home safe and to help prevent falls. Ask for help when making these changes, if needed. What actions can I take to prevent falls? General Instructions  Use good lighting in all rooms. Replace any light bulbs that burn out.  Turn on the lights when you go into a dark area. Use night-lights.  Keep items that you use often in easy-to-reach places. Lower the shelves around your home if necessary.  Set up your furniture so you have a clear path. Avoid moving your furniture around.  Do not have throw rugs and other things on the floor that can make you trip.  Avoid walking on wet floors.  If any of your floors are uneven, fix them.  Add color or contrast paint or tape to clearly mark and help you see: ? Any grab bars or  handrails. ? First and last steps of stairways. ? Where the edge of each step is.  If you use a stepladder: ? Make sure that it is fully opened. Do not climb a closed stepladder. ? Make sure that both sides of the stepladder are locked into place. ? Ask someone to hold the stepladder for you while you use it.  If there are any pets around you, be aware of where they are. What can I do in the bathroom?      Keep the floor dry. Clean up any water that spills onto the floor as soon as it happens.  Remove soap buildup in the tub or shower regularly.  Use non-skid mats or decals on the floor of the tub or shower.  Attach bath mats securely with double-sided, non-slip rug tape.  If you need to sit down in the shower, use a plastic, non-slip stool.  Install grab bars by the toilet and in the tub and shower. Do not use towel bars as grab bars. What can I do in the bedroom?  Make sure that you have a light by your bed that is easy to reach.  Do not use any sheets or blankets that are too big for your bed. They should not hang down onto the floor.  Have a firm chair that has side arms. You can use this for support while you get dressed. What can I do in the kitchen?  Clean up any spills right away.  If you need to reach something above  you, use a strong step stool that has a grab bar.  Keep electrical cords out of the way.  Do not use floor polish or wax that makes floors slippery. If you must use wax, use non-skid floor wax. What can I do with my stairs?  Do not leave any items on the stairs.  Make sure that you have a light switch at the top of the stairs and the bottom of the stairs. If you do not have them, ask someone to add them for you.  Make sure that there are handrails on both sides of the stairs, and use them. Fix handrails that are broken or loose. Make sure that handrails are as long as the stairways.  Install non-slip stair treads on all stairs in your  home.  Avoid having throw rugs at the top or bottom of the stairs. If you do have throw rugs, attach them to the floor with carpet tape.  Choose a carpet that does not hide the edge of the steps on the stairway.  Check any carpeting to make sure that it is firmly attached to the stairs. Fix any carpet that is loose or worn. What can I do on the outside of my home?  Use bright outdoor lighting.  Regularly fix the edges of walkways and driveways and fix any cracks.  Remove anything that might make you trip as you walk through a door, such as a raised step or threshold.  Trim any bushes or trees on the path to your home.  Regularly check to see if handrails are loose or broken. Make sure that both sides of any steps have handrails.  Install guardrails along the edges of any raised decks and porches.  Clear walking paths of anything that might make someone trip, such as tools or rocks.  Have any leaves, snow, or ice cleared regularly.  Use sand or salt on walking paths during winter.  Clean up any spills in your garage right away. This includes grease or oil spills. What other actions can I take?  Wear shoes that: ? Have a low heel. Do not wear high heels. ? Have rubber bottoms. ? Are comfortable and fit you well. ? Are closed at the toe. Do not wear open-toe sandals.  Use tools that help you move around (mobility aids) if they are needed. These include: ? Canes. ? Walkers. ? Scooters. ? Crutches.  Review your medicines with your doctor. Some medicines can make you feel dizzy. This can increase your chance of falling. Ask your doctor what other things you can do to help prevent falls. Where to find more information  Centers for Disease Control and Prevention, STEADI: HealthcareCounselor.com.pt  [employer] on Aging: RingConnections.si Contact a doctor if:  You are afraid of falling at home.  You feel weak, drowsy, or dizzy at home.  You fall at  home. Summary  There are many simple things that you can do to make your home safe and to help prevent falls.  Ways to make your home safe include removing tripping hazards and installing grab bars in the bathroom.  Ask for help when making these changes in your home. This information is not intended to replace advice given to you by your health care provider. Make sure you discuss any questions you have with your health care provider. Document Revised: 08/25/2018 Document Reviewed: 12/17/2016 Elsevier Patient Education  2020 [employer].  Stroke Prevention Some medical conditions and behaviors are associated with a higher chance of  having a stroke. You can help prevent a stroke by making nutrition, lifestyle, and other changes, including managing any medical conditions you may have. What nutrition changes can be made?   Eat healthy foods. You can do this by: ? Choosing foods high in fiber, such as fresh fruits and vegetables and whole grains. ? Eating at least 5 or more servings of fruits and vegetables a day. Try to fill half of your plate at each meal with fruits and vegetables. ? Choosing lean protein foods, such as lean cuts of meat, poultry without skin, fish, tofu, beans, and nuts. ? Eating low-fat dairy products. ? Avoiding foods that are high in salt (sodium). This can help lower blood pressure. ? Avoiding foods that have saturated fat, trans fat, and cholesterol. This can help prevent high cholesterol. ? Avoiding processed and premade foods.  Follow your health care provider's specific guidelines for losing weight, controlling high blood pressure (hypertension), lowering high cholesterol, and managing diabetes. These may include: ? Reducing your daily calorie intake. ? Limiting your daily sodium intake to 1,500 milligrams (mg). ? Using only healthy fats for cooking, such as olive oil, canola oil, or sunflower oil. ? Counting your daily carbohydrate intake. What lifestyle  changes can be made?  Maintain a healthy weight. Talk to your health care provider about your ideal weight.  Get at least 30 minutes of moderate physical activity at least 5 days a week. Moderate activity includes brisk walking, biking, and swimming.  Do not use any products that contain nicotine or tobacco, such as cigarettes and e-cigarettes. If you need help quitting, ask your health care provider. It may also be helpful to avoid exposure to secondhand smoke.  Limit alcohol intake to no more than 1 drink a day for nonpregnant women and 2 drinks a day for men. One drink equals 12 oz of beer, 5 oz of wine, or 1 oz of hard liquor.  Stop any illegal drug use.  Avoid taking birth control pills. Talk to your health care provider about the risks of taking birth control pills if: ? You are over 48 years old. ? You smoke. ? You get migraines. ? You have ever had a blood clot. What other changes can be made?  Manage your cholesterol levels. ? Eating a healthy diet is important for preventing high cholesterol. If cholesterol cannot be managed through diet alone, you may also need to take medicines. ? Take any prescribed medicines to control your cholesterol as told by your health care provider.  Manage your diabetes. ? Eating a healthy diet and exercising regularly are important parts of managing your blood sugar. If your blood sugar cannot be managed through diet and exercise, you may need to take medicines. ? Take any prescribed medicines to control your diabetes as told by your health care provider.  Control your hypertension. ? To reduce your risk of stroke, try to keep your blood pressure below 130/80. ? Eating a healthy diet and exercising regularly are an important part of controlling your blood pressure. If your blood pressure cannot be managed through diet and exercise, you may need to take medicines. ? Take any prescribed medicines to control hypertension as told by your health care  provider. ? Ask your health care provider if you should monitor your blood pressure at home. ? Have your blood pressure checked every year, even if your blood pressure is normal. Blood pressure increases with age and some medical conditions.  Get evaluated for sleep disorders (sleep  apnea). Talk to your health care provider about getting a sleep evaluation if you snore a lot or have excessive sleepiness.  Take over-the-counter and prescription medicines only as told by your health care provider. Aspirin or blood thinners (antiplatelets or anticoagulants) may be recommended to reduce your risk of forming blood clots that can lead to stroke.  Make sure that any other medical conditions you have, such as atrial fibrillation or atherosclerosis, are managed. What are the warning signs of a stroke? The warning signs of a stroke can be easily remembered as BEFAST.  B is for balance. Signs include: ? Dizziness. ? Loss of balance or coordination. ? Sudden trouble walking.  E is for eyes. Signs include: ? A sudden change in vision. ? Trouble seeing.  F is for face. Signs include: ? Sudden weakness or numbness of the face. ? The face or eyelid drooping to one side.  A is for arms. Signs include: ? Sudden weakness or numbness of the arm, usually on one side of the body.  S is for speech. Signs include: ? Trouble speaking (aphasia). ? Trouble understanding.  T is for time. ? These symptoms may represent a serious problem that is an emergency. Do not wait to see if the symptoms will go away. Get medical help right away. Call your local emergency services (911 in the [place] Do not drive yourself to the hospital.  Other signs of stroke may include: ? A sudden, severe headache with no known cause. ? Nausea or vomiting. ? Seizure. Where to find more information For more information, visit:  American Stroke Association: www.strokeassociation.org  National Stroke Association:  www.stroke.org Summary  You can prevent a stroke by eating healthy, exercising, not smoking, limiting alcohol intake, and managing any medical conditions you may have.  Do not use any products that contain nicotine or tobacco, such as cigarettes and e-cigarettes. If you need help quitting, ask your health care provider. It may also be helpful to avoid exposure to secondhand smoke.  Remember BEFAST for warning signs of stroke. Get help right away if you or a loved one has any of these signs. This information is not intended to replace advice given to you by your health care provider. Make sure you discuss any questions you have with your health care provider. Document Revised: 04/16/2017 Document Reviewed: 06/09/2016 Elsevier Patient Education  2020 [employer].

## 2019-12-26 NOTE — Progress Notes (Signed)
[facility] [address] [place]. [place] [address] [phone]       OFFICE FOLLOW-UP NOTE  [name] Date of Birth:  Jan 25, 1971 Medical Record Number:  [id number]   HPI: [name] is a 49 year old Caucasian male seen today for initial office follow-up visit following hospital admission for stroke in July 2021.  He is accompanied by his wife.  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 He reported that he went to bed 11/15/2019 night normal, woke up next morning   at 530, left the house at 7 AM to take his father to a doctor's appointment when he had a sudden onset of dizziness while he was driving. He describes as a sudden onset of the whole world around him spinning making it difficult to concentrate on the road and drive. He immediately pulled over, put his car in park, and his cousin's house was right around the corner, who called an ambulance for him. He was very ataxic while walking. He also was complaining of chest pain going on for 2 or 3 days and in fact had been seen by the nurse at his workplace but found to be okay.Marland Kitchen He was also very ataxic sitting up in bed. He was brought into the emergency room and evaluated for his chest pain and also an MRI was obtained for his complaints of dizziness for the concern of a posterior circulation stroke. 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 He was admitted to the hospital service and a neurological consultation was obtained. He was LKW:7 AM on 11/16/2019. tPA not administered as stroke not recognized initially in the  ED-examination was normal other than subjective dizziness symptoms as documented in the ED provider note.NIH stroke scale on arrival 0 per chart review. Premorbid modified Rankin scale (mRS):0 .  MRI scan showed left posterior inferior cerebellar infarct without mass-effect and a punctate left occipital infarct.  CT angiogram of the head and neck showed no significant large vessel stenosis or occlusion.  Follow-up CT scan 11/17/2019 showed stable affect however patient had significant neurological decline subsequently requiring emergent neurosurgery consult with suboccipital decompressive craniectomy performed by Dr. Venetia Constable on 11/17/2019.  Follow-up scan showed small scattered subarachnoid hemorrhage in the posterior fossa with subacute cerebellar PICA infarct on the left with partial effacement of fourth ventricle.  2D echo showed normal ejection fraction.  TCD bubble study was positive for right to left shunt at rest but unable to perform Valsalva because of patient's condition.  TEE was subsequently obtained which showed highly mobile in her atrial septum but no clear PFO or ASD flow was noted.  A cardiac CT was recommended for further evaluation of anatomy.  Urine drug screen was positive for cannabis.  LDL cholesterol 134 mg percent.  Hemoglobin A1c 5.7.  ANA was positive and a titer of 1 is to 80 but ESR was normal at 6 mm.  Hypercoagulable panel labs were all normal.  Patient is seen by physical occupational therapy and felt to be a good patient for inpatient rehab.  He was started on aspirin Plavix for 3 weeks followed by aspirin alone.  Patient states he is to started outpatient therapy now yesterday.  He is able to walk with a wheeled walker.  He still has imbalance.  His headaches seem to be improving but he still taking Topamax 75 mg twice daily which seems to help.  He has stopped taking Fioricet.  Patient has seen Dr. Sherren Mocha on 12/20/2019 for outpatient consultation for endovascular PFO  closure but he and his wife need more time and not sure they want to do this at the present time but not willing to revisit it later.  The patient feels that his symptoms were brought on due to exposure to liquid carbon dioxide for prolonged.'s during the few weeks prior to onset of his stroke.  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  There was a leak in the pipe and he got exposed to that.  He also had other complaints of chest pain and shortness of breath at the time he had the stroke.  He wants me to comment whether his stroke was caused by his workplace exposure to cryogenic liquid or carbon dioxide. ROS:   14 system review of systems is positive for ataxia, imbalance, gait difficulty, headaches all other systems negative  PMH:  Past Medical History:  Diagnosis Date  . Arthritis    neck  . Carpal tunnel syndrome on both sides 08/2014  . DDD (degenerative disc disease), lumbar   . History of kidney stones   . Hypertension    under control with med., has been on med. x 1 yr.  . Sleep apnea    CPAP but does not use  . Stroke [facility]) 11/16/2019    Social History:  Social History   Socioeconomic History  . Marital status: Married    Spouse name: [name]  . Number of children: Not on file  . Years of education: Not on file  . Highest education level: Not on file  Occupational History  . Not on file  Tobacco Use  . Smoking status: Never Smoker  . Smokeless tobacco: Never Used  Vaping Use  . Vaping Use: Former  Substance and Sexual Activity  . Alcohol use: Not Currently    Comment: quit 2006  . Drug use: No  . Sexual activity: Yes  Other Topics Concern  . Not on file  Social History Narrative   Right handed    Lives with wife    Social Determinants of Health   Financial Resource Strain:   . Difficulty of Paying Living Expenses:   Food Insecurity:   . Worried About Sales executive in the Last Year:   . Arboriculturist in the Last Year:   Transportation Needs:   . Film/video editor (Medical):   Marland Kitchen Lack of Transportation (Non-Medical):   Physical Activity:   . Days of Exercise per Week:   . Minutes of Exercise per Session:   Stress:   . Feeling of Stress :   Social Connections:   . Frequency of Communication with Friends and Family:   . Frequency of Social Gatherings with Friends and Family:   . Attends Religious Services:   . Active Member of Clubs or Organizations:   . Attends Archivist Meetings:   Marland Kitchen Marital Status:   Intimate Partner Violence:   . Fear of Current or Ex-Partner:   . Emotionally Abused:   Marland Kitchen Physically Abused:   .  Sexually Abused:     Medications:   Current Outpatient Medications on File Prior to Visit  Medication Sig Dispense Refill  . acetaminophen (TYLENOL) 325 MG tablet Take 2 tablets (650 mg total) by mouth every 6 (six) hours as needed for mild pain or fever (for mild pain or fever >/=99.5).    Marland Kitchen amLODipine (NORVASC) 10 MG tablet Take 1 tablet (10 mg total) by mouth daily. 30 tablet 0  . aspirin EC 81 MG EC tablet Take 1 tablet (81 mg total) by mouth daily. Swallow whole. 30 tablet 11  . atorvastatin (LIPITOR) 80 MG tablet Take 1 tablet (80 mg total) by mouth daily. 30 tablet 0  . butalbital-acetaminophen-caffeine (FIORICET) 50-325-40 MG tablet Take 2 tablets by mouth every 6 (six) hours as needed for headache. 14 tablet 0  . clonazePAM (KLONOPIN) 0.5 MG tablet Take 1 tablet (0.5 mg total) by mouth as needed (sleep). 20 tablet 0  . docusate sodium (COLACE) 100 MG capsule Take 1 capsule (100 mg total) by mouth 2 (two) times daily. (Patient taking differently: Take 100 mg by mouth daily as needed. ) 10 capsule 0  . losartan (COZAAR) 25 MG tablet Take 3 tablets (75 mg total) by mouth daily. 30 tablet 0  . meclizine (ANTIVERT) 25 MG tablet Take 1 tablet (25 mg total) by mouth 3 (three) times daily as needed for dizziness  or nausea. 30 tablet 0  . propranolol (INDERAL) 40 MG tablet Take 1 tablet (40 mg total) by mouth 3 (three) times daily. 90 tablet 0  . traMADol (ULTRAM) 50 MG tablet Take 2 tablets (100 mg total) by mouth every 6 (six) hours as needed for moderate pain. 30 tablet 0   No current facility-administered medications on file prior to visit.    Allergies:  No Known Allergies  Physical Exam General: Mildly obese middle-age Caucasian male seated, in no evident distress Head: head normocephalic and atraumatic.  Neck: supple with no carotid or supraclavicular bruits Cardiovascular: regular rate and rhythm, no murmurs Musculoskeletal: no deformity Skin:  no rash/petichiae Vascular:  Normal pulses all extremities Vitals:   12/26/19 1029  BP: 125/88  Pulse: 72   Neurologic Exam Mental Status: Awake and fully alert. Oriented to place and time. Recent and remote memory intact. Attention span, concentration and fund of knowledge appropriate. Mood and affect appropriate.  Cranial Nerves: Fundoscopic exam reveals sharp disc margins. Pupils equal, briskly reactive to light. Extraocular movements full without nystagmus but has saccadic dysmetria on left lateral gaze.. Visual fields full to confrontation. Hearing intact. Facial sensation intact. Face, tongue, palate moves normally and symmetrically.  Motor: Normal bulk and tone. Normal strength in all tested extremity muscles. Sensory.: intact to touch ,pinprick .position and vibratory sensation.  Coordination: Rapid alternating movements normal in all extremities.  Mild left finger-to-nose and knee to heel dysmetria.   Gait and Station: Arises from chair with  difficulty. Stance is broad-based.  Uses a wheeled walker..  Ataxic broad-based gait with difficulty with turns.   Reflexes: 1+ and symmetric. Toes downgoing.   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  Vascular risk factors of mild obesity, hyperlipidemia, hypertension and cannabis abuse.  Patient seems convinced his stroke was brought on by exposure to liquid carbon dioxide at his workplace     PLAN: I had a long d/w patient and his wife about his recent cryptogenic  stroke, risk for recurrent stroke/TIAs, personally independently reviewed imaging studies and stroke evaluation results and answered questions.Continue aspirin 81 mg daily  for secondary stroke prevention and maintain strict control of hypertension with blood pressure goal below 130/90, diabetes with hemoglobin A1c goal below 6.5% and lipids with LDL cholesterol goal below 70 mg/dL. I also advised the patient to eat a healthy diet with plenty of whole grains, cereals, fruits and vegetables, exercise regularly and maintain ideal body weight he was encouraged to use his wheeled walker at all times and we discussed fall safety precautions.  Continue outpatient physical and occupational therapy.  He was advised to consider elective endovascular PFO closure and follow-up with Dr. Sherren Mocha for the same.  I also counseled him to quit using marijuana and cigarettes.  I am not aware of any literature suggesting increased risk of stroke with exposure to cryogenic liquid carbon dioxide. but will try to look it up. followup in the future with my nurse practitioner Janett Billow in 3 months or call earlier if necessary.. Greater than 50% of time during this 35 minute visit was spent on counseling,explanation of diagnosis of cryptogenic stroke, PFO and stroke, planning of further management, discussion with patient and family and coordination of care Antony Contras, MD  [facility] [address] [place], [place] [address]  Phone [phone] Fax [phone] Note: This document was prepared with digital dictation and possible smart phrase technology. Any transcriptional errors that result  from this process are unintentional

## 2019-12-28 ENCOUNTER — Telehealth: Payer: Self-pay | Admitting: Neurology

## 2019-12-28 NOTE — Telephone Encounter (Signed)
Pt's wife called stating that they have not heard from provider about some research he was going to do on the pt's stroke and they would like to know what the update is on this. Please advise.

## 2019-12-28 NOTE — Telephone Encounter (Signed)
Called wife and advised her of Dr Marlis Edelson note in entirety. She  verbalized understanding, appreciation.

## 2019-12-28 NOTE — Telephone Encounter (Signed)
I called the patient's wife's listed phone number but mailbox was full and was unable to leave a message.  Kindly inform them that I have looked up possible association between exposure to cryogenic liquid carbon dioxide in pub med which is the largest database for medical literature as well as in [employer] and not found any significant relationship between cryptogenic stroke, blood clots and carbon dioxide.

## 2019-12-29 ENCOUNTER — Ambulatory Visit: Payer: 59 | Admitting: Physical Therapy

## 2019-12-29 ENCOUNTER — Other Ambulatory Visit: Payer: Self-pay

## 2019-12-29 ENCOUNTER — Encounter: Payer: Self-pay | Admitting: Physical Therapy

## 2019-12-29 DIAGNOSIS — M21371 Foot drop, right foot: Secondary | ICD-10-CM

## 2019-12-29 DIAGNOSIS — I69351 Hemiplegia and hemiparesis following cerebral infarction affecting right dominant side: Secondary | ICD-10-CM

## 2019-12-29 DIAGNOSIS — R2681 Unsteadiness on feet: Secondary | ICD-10-CM

## 2019-12-29 DIAGNOSIS — M6281 Muscle weakness (generalized): Secondary | ICD-10-CM

## 2019-12-29 DIAGNOSIS — R2689 Other abnormalities of gait and mobility: Secondary | ICD-10-CM

## 2019-12-29 NOTE — Therapy (Signed)
[facility] [address] [place], [place], [address] Phone: [phone]   Fax:  [phone]  Physical Therapy Treatment  Patient Details  Name: [name] MRN: [id number] Date of Birth: 10-31-1970 Referring Provider (PT): [name], [name], [name], MD   Encounter Date: 12/29/2019   PT End of Session - 12/29/19 1110    Visit Number 2    Number of Visits 17    Date for PT Re-Evaluation 03/24/20   POC for 8 weeks, Cert for 90 days   Authorization Type [employer]    PT Start Time 0802    PT Stop Time 0840   pt needing to leave slightly early to make it to neurosurgeon appt   PT Time Calculation (min) 38 min    Equipment Utilized During Treatment Gait belt    Activity Tolerance Patient tolerated treatment well;No increased pain    Behavior During Therapy WFL for tasks assessed/performed           Past Medical History:  Diagnosis Date  . Arthritis    neck  . Carpal tunnel syndrome on both sides 08/2014  . DDD (degenerative disc disease), lumbar   . History of kidney stones   . Hypertension    under control with med., has been on med. x 1 yr.  . Sleep apnea    CPAP but does not use  . Stroke [facility]) 11/16/2019    Past Surgical History:  Procedure Laterality Date  . BUBBLE STUDY  11/21/2019   Procedure: BUBBLE STUDY;  Surgeon: [name], MD;  Location: [facility] ENDOSCOPY;  Service: Cardiovascular;;  . CARPAL TUNNEL RELEASE Left 09/07/2014   Procedure: LEFT CARPAL TUNNEL RELEASE;  Surgeon: [name], MD;  Location: [facility];  Service: Orthopedics;  Laterality: Left;  . CARPAL TUNNEL RELEASE Right 10/12/2014   Procedure: RIGHT CARPAL TUNNEL RELEASE;  Surgeon: [name], MD;  Location: [facility];  Service: Orthopedics;  Laterality: Right;  . KNEE ARTHROSCOPY Left   . SUBOCCIPITAL CRANIECTOMY CERVICAL LAMINECTOMY N/A  11/17/2019   Procedure: SUBOCCIPITAL DECOMPRESSIVE CRANIECTOMY;  Surgeon: [name], MD;  Location: [facility] OR;  Service: Neurosurgery;  Laterality: N/A;  . TEE WITHOUT CARDIOVERSION N/A 11/21/2019   Procedure: TRANSESOPHAGEAL ECHOCARDIOGRAM (TEE);  Surgeon: [name], MD;  Location: [facility] ENDOSCOPY;  Service: Cardiovascular;  Laterality: N/A;    There were no vitals filed for this visit.   Subjective Assessment - 12/29/19 0807    Subjective No falls. Saw the neurologist yesterday and going to see the neurosurgeon today. Reports that he is continuing to have headaches - worse at night. Continues to be more tired. Has episodes sometimes when he is sitting and needs to grab onto the mat or stare at an object to have this sensation of unsteadiness to go away - lasts about 30 seconds to 1 minute.    Patient is accompained by: Family member   Wife [name]   Pertinent History hypertension, hyperlipidemia, depression/anxiety, degenerative disc disease in lumbar region, obesity, sleep apnea    Limitations Walking;Standing    Patient Stated Goals Back to Normal, Do the things I did prior    Currently in Pain? Yes   "just a generalized pain - just don't feel comfortable"   Pain Score 5     Pain Location Generalized                    Access Code: [email or web address]  URL: [email or web address] Date: 12/29/2019 Prepared by: Sherlie Ban  Initiated HEP for BLE strengthening - see MedBridge for more details.   Exercises Seated March with Resistance - 1-2 x daily - 7 x weekly - 2 sets - 10 reps - with red tband resistance  Seated Hip Abduction with Resistance - 1-2 x daily - 7 x weekly - 2 sets - 10 reps - with red tband resistance  Seated Hip Adduction Isometrics with Ball - 1-2 x daily - 7 x weekly - 2 sets - 10 reps Seated Long Arc Quad - 1-2 x daily - 7 x weekly - 2 sets - 10 reps - with red tband resistance, cues for eccentric control  Seated Heel Toe Raises - 2 x  daily - 7 x weekly - 2 sets - 10 reps Sit to Stand with Armchair - 1-2 x daily - 7 x weekly - 1-2 sets - 5 reps - with rollator anteriorly           [facility] Adult PT Treatment/Exercise - 12/29/19 0001      Transfers   Transfers Sit to Stand;Stand to Sit    Sit to Stand 4: Min guard;5: Supervision    Stand to Sit 5: Supervision    Transfer Cueing pt needing cues to lock rollator before sitting down on mat table     Comments x5 reps - initial cues for proper UE placement      Ambulation/Gait   Ambulation/Gait Yes    Ambulation/Gait Assistance 4: Min guard    Ambulation/Gait Assistance Details in and out of clinic, pt performing with incr knee flexion on R to clear foot, due to decr DF on R    Ambulation Distance (Feet) 100 Feet   x2   Assistive device Rollator    Gait Pattern Step-through pattern;Decreased step length - left;Decreased stance time - right;Decreased dorsiflexion - right;Right steppage;Wide base of support;Poor foot clearance - right    Ambulation Surface Level;Indoor                  PT Education - 12/29/19 1110    Education Details possible referral for aquatic therapy (if appropriate) for skilled HEP for pt to perform in his pool at home - pt reporting he currently goes in the pool for pain relief and working on side stepping and it is very helpful, initial HEP for BLE strengthening    Person(s) Educated Patient;Spouse    Methods Explanation;Demonstration;Handout;Verbal cues;Tactile cues    Comprehension Verbalized understanding;Returned demonstration            PT Short Term Goals - 12/25/19 1010      PT SHORT TERM GOAL #1   Title patient will be independent with initial HEP for strengthening/balance (All STG's Due: 01/22/20)    Baseline no HEP established    Time 4    Period Weeks    Status New    Target Date 01/22/20      PT SHORT TERM GOAL #2   Title Patient will demo ability to ambulate >500 ft on indoor level surfaces with LRAD and  supervision to demo improved household mobility    Baseline 100, CGA with Rollator    Time 4    Period Weeks    Status New      PT SHORT TERM GOAL #3   Title Patient will improve TUG score w/ LRAD to </= 25 seconds to demo improved functional mobility    Baseline 35.84 secs    Time 4  Period Weeks    Status New      PT SHORT TERM GOAL #4   Title Patient will improve 5x sit <> stand to </= 30 seconds to demonstrate improved functional strength    Baseline 49.56 secs w/ UE support    Time 4    Period Weeks    Status New      PT SHORT TERM GOAL #5   Title Berg Balance to be assessed and LTG to be set as appropriate    Baseline baseline not established    Time 4    Period Weeks    Status New             PT Long Term Goals - 12/25/19 1013      PT LONG TERM GOAL #1   Title Patient will be independent with final HEP for strengthening/balance (All LTG's due: 02/19/20)    Baseline no HEP established    Time 8    Period Weeks    Status New    Target Date 02/19/20      PT LONG TERM GOAL #2   Title Patient will demonstrate ability to ambulate >1000 on indoor/outdoor surfaces with LRAD and supervision for improved community mobility    Baseline 100 ft CGA    Time 8    Period Weeks    Status New      PT LONG TERM GOAL #3   Title Patient will improve gait speed to >/= 2.0 ft/sec  with LRAD to demonstrate improved community ambulation    Baseline 0.69 ft/sec    Time 8    Period Weeks    Status New      PT LONG TERM GOAL #4   Title Patient will improve 5x sit <> stand to </= 20 secs to demonstrate improved functional mobility    Baseline 49.56 secs    Time 8    Period Weeks    Status New      PT LONG TERM GOAL #5   Title Patient will improve Tug to </= 15 seconds with LRAD to demo improved mobility and reduced fall risk    Baseline 35.84 secs    Time 8    Period Weeks    Status New      Additional Long Term Goals   Additional Long Term Goals Yes      PT LONG  TERM GOAL #6   Title LTG to be set for [employer] Balance as appropriate    Baseline baseline not established    Time 8    Period Weeks    Status New                 Plan - 12/29/19 1111    Clinical Impression Statement Focus of today's skilled session was initiating HEP for BLE strengthening. Pt needing cues for slow sit <> stands in order to decr feelings of unsteadiness. Pt tolerated session well, pt reporting incr back pain at times, educated on performing HEP in chair with back support. Will continue to progress towards LTGs.    Personal Factors and Comorbidities Comorbidity 3+    Comorbidities hypertension, hyperlipidemia, depression/anxiety, degenerative disc disease in lumbar region, obesity, sleep apnea    Examination-Activity Limitations Bend;Transfers;Stairs;Stand;Lift    Examination-Participation Restrictions Occupation;Yard Work;Community Activity;Driving    Stability/Clinical Decision Making Evolving/Moderate complexity    Rehab Potential Good    PT Frequency 2x / week    PT Duration 8 weeks    PT Treatment/Interventions ADLs/Self Care  Home Management;Aquatic Therapy;Cryotherapy;Electrical Stimulation;Moist Heat;DME Instruction;Gait training;Stair training;Functional mobility training;Therapeutic activities;Therapeutic exercise;Balance training;Neuromuscular re-education;Patient/family education;Orthotic Fit/Training;Manual techniques;Passive range of motion;Vestibular    PT Next Visit Plan Assess Berg Balance and update Goals (did not have time for this today); how was initial HEP? Assess convergence/peripheral vision.  Potential referral to neuropsychologist?    Recommended Other Services maybe aquatics??    Consulted and Agree with Plan of Care Patient;Family member/caregiver    Family Member Consulted Wife           Patient will benefit from skilled therapeutic intervention in order to improve the following deficits and impairments:  Abnormal gait, Decreased balance,  Decreased endurance, Difficulty walking, Impaired sensation, Dizziness, Decreased range of motion, Decreased activity tolerance, Decreased knowledge of use of DME, Decreased strength, Pain  Visit Diagnosis: No diagnosis found.     Problem List Patient Active Problem List   Diagnosis Date Noted  . Transaminitis   . PFO (patent foramen ovale) 11/22/2019  . Headache 11/22/2019  . Cerebral edema (HCC) 11/22/2019  . Chest pain 11/22/2019  . Hyperlipidemia LDL goal <70 11/22/2019  . OSA (obstructive sleep apnea) 11/22/2019  . 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 Vascular headache   . Dyslipidemia   . Hypertension   . Intractable nausea and vomiting   . Hypokalemia   . Depression with anxiety   . Ischemic stroke (HCC) - L PICA infarct embolic in setting of PFO s/p crani   . Lumbar spondylolysis 07/31/2019  . Lumbar radiculopathy 04/07/2019  . Right foot drop 04/07/2019  . Backache 07/05/2017  . DDD (degenerative disc disease), lumbar 07/05/2017  . Spinal stenosis of lumbar region 07/05/2017    Drake Leach , PT, DPT  12/29/2019, 11:15 AM  [facility] [address] [place], [place], [address] Phone: [phone]   Fax:  [phone]  Name: [name] MRN: [id number] Date of Birth: [birth year]

## 2019-12-29 NOTE — Patient Instructions (Signed)
Access Code: [email or web address] URL: [email or web address] Date: 12/29/2019 Prepared by: Sherlie Ban  Exercises Seated March with Resistance - 1-2 x daily - 7 x weekly - 2 sets - 10 reps Seated Hip Abduction with Resistance - 1-2 x daily - 7 x weekly - 2 sets - 10 reps Seated Hip Adduction Isometrics with Ball - 1-2 x daily - 7 x weekly - 2 sets - 10 reps Seated Long Arc Quad - 1-2 x daily - 7 x weekly - 2 sets - 10 reps Seated Heel Toe Raises - 2 x daily - 7 x weekly - 2 sets - 10 reps Sit to Stand with Armchair - 1-2 x daily - 7 x weekly - 1-2 sets - 5 reps

## 2020-01-01 ENCOUNTER — Other Ambulatory Visit: Payer: Self-pay

## 2020-01-01 ENCOUNTER — Ambulatory Visit: Payer: 59

## 2020-01-01 DIAGNOSIS — M6281 Muscle weakness (generalized): Secondary | ICD-10-CM

## 2020-01-01 DIAGNOSIS — I69351 Hemiplegia and hemiparesis following cerebral infarction affecting right dominant side: Secondary | ICD-10-CM

## 2020-01-01 DIAGNOSIS — M21371 Foot drop, right foot: Secondary | ICD-10-CM

## 2020-01-01 DIAGNOSIS — R2689 Other abnormalities of gait and mobility: Secondary | ICD-10-CM

## 2020-01-01 DIAGNOSIS — R2681 Unsteadiness on feet: Secondary | ICD-10-CM

## 2020-01-01 NOTE — Therapy (Signed)
[facility] [address] [place], [place], [address] Phone: [phone]   Fax:  [phone]  Physical Therapy Treatment  Patient Details  Name: [name] MRN: [id number] Date of Birth: 09-Mar-1971 Referring Provider (PT): [name], [name], [name], MD   Encounter Date: 01/01/2020   PT End of Session - 01/01/20 1016    Visit Number 3    Number of Visits 17    Date for PT Re-Evaluation 03/24/20   POC for 8 weeks, Cert for 90 days   Authorization Type [employer]    PT Start Time 1016    PT Stop Time 1100    PT Time Calculation (min) 44 min    Equipment Utilized During Treatment Gait belt    Activity Tolerance Patient tolerated treatment well;No increased pain    Behavior During Therapy WFL for tasks assessed/performed           Past Medical History:  Diagnosis Date  . Arthritis    neck  . Carpal tunnel syndrome on both sides 08/2014  . DDD (degenerative disc disease), lumbar   . History of kidney stones   . Hypertension    under control with med., has been on med. x 1 yr.  . Sleep apnea    CPAP but does not use  . Stroke [facility]) 11/16/2019    Past Surgical History:  Procedure Laterality Date  . BUBBLE STUDY  11/21/2019   Procedure: BUBBLE STUDY;  Surgeon: [name], MD;  Location: [facility] ENDOSCOPY;  Service: Cardiovascular;;  . CARPAL TUNNEL RELEASE Left 09/07/2014   Procedure: LEFT CARPAL TUNNEL RELEASE;  Surgeon: [name], MD;  Location: [facility];  Service: Orthopedics;  Laterality: Left;  . CARPAL TUNNEL RELEASE Right 10/12/2014   Procedure: RIGHT CARPAL TUNNEL RELEASE;  Surgeon: [name], MD;  Location: [facility];  Service: Orthopedics;  Laterality: Right;  . KNEE ARTHROSCOPY Left   . SUBOCCIPITAL CRANIECTOMY CERVICAL LAMINECTOMY N/A 11/17/2019   Procedure: SUBOCCIPITAL DECOMPRESSIVE CRANIECTOMY;   Surgeon: [name], MD;  Location: [facility] OR;  Service: Neurosurgery;  Laterality: N/A;  . TEE WITHOUT CARDIOVERSION N/A 11/21/2019   Procedure: TRANSESOPHAGEAL ECHOCARDIOGRAM (TEE);  Surgeon: [name], MD;  Location: [facility] ENDOSCOPY;  Service: Cardiovascular;  Laterality: N/A;    There were no vitals filed for this visit.   Subjective Assessment - 01/01/20 1020    Subjective No new complaints/since last visit. Patient reports some general soreness. No falls. Patient reports HEP is going well.    Patient is accompained by: Family member   Wife [name]   Pertinent History hypertension, hyperlipidemia, depression/anxiety, degenerative disc disease in lumbar region, obesity, sleep apnea    Limitations Walking;Standing    Patient Stated Goals Back to Normal, Do the things I did prior    Currently in Pain? Yes    Pain Score 5     Pain Location Generalized    Pain Orientation Other (Comment)                             [facility] Adult PT Treatment/Exercise - 01/01/20 0001      Transfers   Transfers Sit to Stand;Stand to Sit    Sit to Stand 5: Supervision    Stand to Sit 5: Supervision    Comments completed x 5 reps with slight UE support from hands on knee. R foot positioned posterior for  further strengthening of RLE. patient demo fatigue after 5 reps, and reports mild uncomfort in low back.       Ambulation/Gait   Ambulation/Gait Yes    Ambulation/Gait Assistance 4: Min guard    Ambulation/Gait Assistance Details completed gait training initiallly x 115 ft with Rollator. Progressed to completed ambulating without AD, x 240 ft with CGA from PT. PT providing verbal cues for improved step length. Increased fatigue noted with RLE with increased ambulation distances. Patient having to pause during ambulation to "gather thoughts" per patient reports. also reports having to focus on the floor and difficutly looking up with ambulation as often throws his balance off.       Ambulation Distance (Feet) 115 Feet   240 ft   Assistive device Rollator;None    Gait Pattern Step-through pattern;Decreased step length - left;Decreased stance time - right;Decreased dorsiflexion - right;Right steppage;Wide base of support;Poor foot clearance - right    Ambulation Surface Level;Indoor      Standardized Balance Assessment   Standardized Balance Assessment Berg Balance Test      Berg Balance Test   Sit to Stand Able to stand  independently using hands    Standing Unsupported Able to stand safely 2 minutes    Sitting with Back Unsupported but Feet Supported on Floor or Stool Able to sit safely and securely 2 minutes    Stand to Sit Controls descent by using hands    Transfers Able to transfer safely, definite need of hands    Standing Unsupported with Eyes Closed Able to stand 10 seconds with supervision    Standing Ubsupported with Feet Together Able to place feet together independently and stand for 1 minute with supervision    From Standing, Reach Forward with Outstretched Arm Can reach forward >12 cm safely (5")    From Standing Position, Pick up Object from Floor Able to pick up shoe, needs supervision    From Standing Position, Turn to Look Behind Over each Shoulder Turn sideways only but maintains balance    Turn 360 Degrees Able to turn 360 degrees safely but slowly    Standing Unsupported, Alternately Place Feet on Step/Stool Able to stand independently and complete 8 steps >20 seconds    Standing Unsupported, One Foot in Front Able to take small step independently and hold 30 seconds    Standing on One Leg Tries to lift leg/unable to hold 3 seconds but remains standing independently    Total Score 39      Self-Care   Self-Care Other Self-Care Comments    Other Self-Care Comments  Spoke to patient regarding getting apointment scheduled for neuropsych to help with emotional liability and post stroke depression. PT educaitng on benefits of neuropsych to help with  management of mood and symptoms experiencing.                     PT Short Term Goals - 01/01/20 1254      PT SHORT TERM GOAL #1   Title patient will be independent with initial HEP for strengthening/balance (All STG's Due: 01/22/20)    Baseline no HEP established    Time 4    Period Weeks    Status New    Target Date 01/22/20      PT SHORT TERM GOAL #2   Title Patient will demo ability to ambulate >500 ft on indoor level surfaces with LRAD and supervision to demo improved household mobility    Baseline 100, CGA with  Rollator    Time 4    Period Weeks    Status New      PT SHORT TERM GOAL #3   Title Patient will improve TUG score w/ LRAD to </= 25 seconds to demo improved functional mobility    Baseline 35.84 secs    Time 4    Period Weeks    Status New      PT SHORT TERM GOAL #4   Title Patient will improve 5x sit <> stand to </= 30 seconds to demonstrate improved functional strength    Baseline 49.56 secs w/ UE support    Time 4    Period Weeks    Status New      PT SHORT TERM GOAL #5   Title Berg Balance to be assessed and LTG to be set as appropriate    Baseline baseline established LTG set.    Time 4    Period Weeks    Status Achieved             PT Long Term Goals - 01/01/20 1255      PT LONG TERM GOAL #1   Title Patient will be independent with final HEP for strengthening/balance (All LTG's due: 02/19/20)    Baseline HEP established    Time 8    Period Weeks    Status New      PT LONG TERM GOAL #2   Title Patient will demonstrate ability to ambulate >1000 on indoor/outdoor surfaces with LRAD and supervision for improved community mobility    Baseline 100 ft CGA    Time 8    Period Weeks    Status New      PT LONG TERM GOAL #3   Title Patient will improve gait speed to >/= 2.0 ft/sec  with LRAD to demonstrate improved community ambulation    Baseline 0.69 ft/sec    Time 8    Period Weeks    Status New      PT LONG TERM GOAL #4    Title Patient will improve 5x sit <> stand to </= 20 secs to demonstrate improved functional mobility    Baseline 49.56 secs    Time 8    Period Weeks    Status New      PT LONG TERM GOAL #5   Title Patient will improve Tug to </= 15 seconds with LRAD to demo improved mobility and reduced fall risk    Baseline 35.84 secs    Time 8    Period Weeks    Status New      PT LONG TERM GOAL #6   Title Patient will improve Berg Balance to >/= 45/56 to demonstrate reduced fall risk and improved balance    Baseline 39/56    Time 8    Period Weeks    Status New                 Plan - 01/01/20 1250    Clinical Impression Statement Today's skilled PT session included establishing baseline for Berg Balance, with patient scoring 39/56. Completed ambulation without AD, with patient ability to ambualte x 240 ft without AD and CGA from PT. Patient will continue to benefit from skilled PT services to progress toward all goals.    Personal Factors and Comorbidities Comorbidity 3+    Comorbidities hypertension, hyperlipidemia, depression/anxiety, degenerative disc disease in lumbar region, obesity, sleep apnea    Examination-Activity Limitations Bend;Transfers;Stairs;Stand;Lift    Examination-Participation Restrictions Occupation;Yard Work;Community Activity;Driving  Stability/Clinical Decision Making Evolving/Moderate complexity    Rehab Potential Good    PT Frequency 2x / week    PT Duration 8 weeks    PT Treatment/Interventions ADLs/Self Care Home Management;Aquatic Therapy;Cryotherapy;Electrical Stimulation;Moist Heat;DME Instruction;Gait training;Stair training;Functional mobility training;Therapeutic activities;Therapeutic exercise;Balance training;Neuromuscular re-education;Patient/family education;Orthotic Fit/Training;Manual techniques;Passive range of motion;Vestibular    PT Next Visit Plan Continue gait without AD. Step Overs. Functional [place]. Recieved AFO?    Consulted and  Agree with Plan of Care Patient;Family member/caregiver    Family Member Consulted Wife           Patient will benefit from skilled therapeutic intervention in order to improve the following deficits and impairments:  Abnormal gait, Decreased balance, Decreased endurance, Difficulty walking, Impaired sensation, Dizziness, Decreased range of motion, Decreased activity tolerance, Decreased knowledge of use of DME, Decreased strength, Pain  Visit Diagnosis: Muscle weakness (generalized)  Other abnormalities of gait and mobility  Hemiplegia and hemiparesis following cerebral infarction affecting right dominant side (HCC)  Foot drop, right  Unsteadiness on feet     Problem List Patient Active Problem List   Diagnosis Date Noted  . Transaminitis   . PFO (patent foramen ovale) 11/22/2019  . Headache 11/22/2019  . Cerebral edema (HCC) 11/22/2019  . Chest pain 11/22/2019  . Hyperlipidemia LDL goal <70 11/22/2019  . OSA (obstructive sleep apnea) 11/22/2019  . 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 Vascular headache   . Dyslipidemia   . Hypertension   . Intractable nausea and vomiting   . Hypokalemia   . Depression with anxiety   . Ischemic stroke (HCC) - L PICA infarct embolic in setting of PFO s/p crani   . Lumbar spondylolysis 07/31/2019  . Lumbar radiculopathy 04/07/2019  . Right foot drop 04/07/2019  . Backache 07/05/2017  . DDD (degenerative disc disease), lumbar 07/05/2017  . Spinal stenosis of lumbar region 07/05/2017    Tempie Donning, PT, DPT 01/01/2020, 12:57 PM  [facility] [address] [place], [place], [address] Phone: [phone]   Fax:  [phone]  Name: [name] MRN: [id number] Date of Birth: Apr 26, 1971

## 2020-01-03 ENCOUNTER — Other Ambulatory Visit: Payer: Self-pay

## 2020-01-03 ENCOUNTER — Ambulatory Visit: Payer: 59

## 2020-01-03 DIAGNOSIS — M6281 Muscle weakness (generalized): Secondary | ICD-10-CM

## 2020-01-03 DIAGNOSIS — R2681 Unsteadiness on feet: Secondary | ICD-10-CM

## 2020-01-03 DIAGNOSIS — R2689 Other abnormalities of gait and mobility: Secondary | ICD-10-CM

## 2020-01-03 DIAGNOSIS — M21371 Foot drop, right foot: Secondary | ICD-10-CM

## 2020-01-03 DIAGNOSIS — I69351 Hemiplegia and hemiparesis following cerebral infarction affecting right dominant side: Secondary | ICD-10-CM | POA: Diagnosis not present

## 2020-01-03 NOTE — Patient Instructions (Signed)
Access Code: [email or web address] URL: [email or web address] Date: 01/03/2020 Prepared by: Jethro Bastos  Exercises Seated March with Resistance - 1-2 x daily - 7 x weekly - 2 sets - 10 reps Seated Hip Abduction with Resistance - 1-2 x daily - 7 x weekly - 2 sets - 10 reps Seated Hip Adduction Isometrics with Ball - 1-2 x daily - 7 x weekly - 2 sets - 10 reps Seated Long Arc Quad - 1-2 x daily - 7 x weekly - 2 sets - 10 reps Seated Heel Toe Raises - 2 x daily - 7 x weekly - 2 sets - 10 reps Sit to Stand with Armchair - 1-2 x daily - 7 x weekly - 1-2 sets - 5 reps Wide Stance with Head Rotations and Unilateral Counter Support - 1 x daily - 7 x weekly - 2 sets - 10 reps

## 2020-01-03 NOTE — Therapy (Addendum)
[facility] [address] [place], [place], [address] Phone: [phone]   Fax:  [phone]  Physical Therapy Treatment  Patient Details  Name: [name] MRN: [id number] Date of Birth: August 10, 1970 Referring Provider (PT): [name], [name], [name], MD   Encounter Date: 01/03/2020   PT End of Session - 01/03/20 0800    Visit Number 4    Number of Visits 17    Date for PT Re-Evaluation 03/24/20   POC for 8 weeks, Cert for 90 days   Authorization Type [employer]    PT Start Time 0800    PT Stop Time 0845    PT Time Calculation (min) 45 min    Equipment Utilized During Treatment Gait belt    Activity Tolerance Patient tolerated treatment well;No increased pain    Behavior During Therapy WFL for tasks assessed/performed           Past Medical History:  Diagnosis Date  . Arthritis    neck  . Carpal tunnel syndrome on both sides 08/2014  . DDD (degenerative disc disease), lumbar   . History of kidney stones   . Hypertension    under control with med., has been on med. x 1 yr.  . Sleep apnea    CPAP but does not use  . Stroke [facility]) 11/16/2019    Past Surgical History:  Procedure Laterality Date  . BUBBLE STUDY  11/21/2019   Procedure: BUBBLE STUDY;  Surgeon: [name], MD;  Location: [facility] ENDOSCOPY;  Service: Cardiovascular;;  . CARPAL TUNNEL RELEASE Left 09/07/2014   Procedure: LEFT CARPAL TUNNEL RELEASE;  Surgeon: [name], MD;  Location: [facility];  Service: Orthopedics;  Laterality: Left;  . CARPAL TUNNEL RELEASE Right 10/12/2014   Procedure: RIGHT CARPAL TUNNEL RELEASE;  Surgeon: [name], MD;  Location: [facility];  Service: Orthopedics;  Laterality: Right;  . KNEE ARTHROSCOPY Left   . SUBOCCIPITAL CRANIECTOMY CERVICAL LAMINECTOMY N/A 11/17/2019   Procedure: SUBOCCIPITAL DECOMPRESSIVE CRANIECTOMY;   Surgeon: [name], MD;  Location: [facility] OR;  Service: Neurosurgery;  Laterality: N/A;  . TEE WITHOUT CARDIOVERSION N/A 11/21/2019   Procedure: TRANSESOPHAGEAL ECHOCARDIOGRAM (TEE);  Surgeon: [name], MD;  Location: [facility] ENDOSCOPY;  Service: Cardiovascular;  Laterality: N/A;    There were no vitals filed for this visit.   Subjective Assessment - 01/03/20 0803    Subjective Patient is meeting with [employer] today at 2pm for AFO. Patient reports did not sleep well last night. Just general soreness today, no specific pain.    Patient is accompained by: Family member   Wife [name]   Pertinent History hypertension, hyperlipidemia, depression/anxiety, degenerative disc disease in lumbar region, obesity, sleep apnea    Limitations Walking;Standing    Patient Stated Goals Back to Normal, Do the things I did prior    Currently in Pain? No/denies                             [facility] Adult PT Treatment/Exercise - 01/03/20 0001      Transfers   Transfers Sit to Stand;Stand to Sit    Sit to Stand 5: Supervision    Stand to Sit 5: Supervision    Comments completed x 5 reps with activites throughout session      Ambulation/Gait   Ambulation/Gait Yes    Ambulation/Gait Assistance 4: Min guard    Ambulation/Gait  Assistance Details completed gait training without AD and Min Guard from PT. PT providing verbal cues for improved step length, one instance of needing to stop to regain thoughouts during ambulation    Ambulation Distance (Feet) 575 Feet    Assistive device None    Gait Pattern Step-through pattern;Decreased step length - left;Decreased stance time - right;Decreased dorsiflexion - right;Right steppage;Wide base of support;Poor foot clearance - right    Ambulation Surface Level;Indoor      Neuro Re-ed    Neuro Re-ed Details  In // bars starting with BUE support, completing alternating toe taps to 6" step x 10 reps, x 10 reps with single UE support, and x 10  reps with no UE support. Increased time required to complete without UE support. Increased CGA required with no UE support.       Exercises   Exercises Other Exercises    Other Exercises  Completed forward step up to 6" step with BUE x 10 reps, leading with RLE.                Balance Exercises - 01/03/20 0001      Balance Exercises: Standing   Standing Eyes Opened Wide (BOA);Head turns;Limitations    Standing Eyes Opened Limitations with Wide BOS, completed horizontal head turns x 10 reps to bilateral direction, PT educating on completing at comfortable pace. Educating on completion at home with HEP addition and ensuring to complete in a corner for safety or having a chair placed in front in case of UE support required. No instances of instability/unsteadiness noticed with completion today.     Balance Beam Standing across balance beam worked on holding steady with eyes open 2 x 30 seconds, progressed to eyes closed patient unable to maintain balance >5 seconds, requiring UE support and CGA to maintain balance, often loosing balance posteriorly. Completed horizontal head turns 1 x 10 reps with UE support standing across balance beam ,increased balance challenge noted. completed vertical head turns x 5 reps, patient reports internalized sensation of dizziness initially but resolved.     Other Standing Exercises Standing in // bars, completed forward steps over black beam x 10 reps leading with RLE, and x 10 reps leading with LLE. Patient demo more difficulty with LLE as RLE wants to go into slight knee flexion due to weakness.              PT Education - 01/03/20 0902    Education Details educated on HEP update    Person(s) Educated Patient    Methods Explanation;Demonstration;Handout    Comprehension Verbalized understanding;Returned demonstration            PT Short Term Goals - 01/01/20 1254      PT SHORT TERM GOAL #1   Title patient will be independent with initial HEP for  strengthening/balance (All STG's Due: 01/22/20)    Baseline no HEP established    Time 4    Period Weeks    Status New    Target Date 01/22/20      PT SHORT TERM GOAL #2   Title Patient will demo ability to ambulate >500 ft on indoor level surfaces with LRAD and supervision to demo improved household mobility    Baseline 100, CGA with Rollator    Time 4    Period Weeks    Status New      PT SHORT TERM GOAL #3   Title Patient will improve TUG score w/ LRAD to </= 25 seconds to  demo improved functional mobility    Baseline 35.84 secs    Time 4    Period Weeks    Status New      PT SHORT TERM GOAL #4   Title Patient will improve 5x sit <> stand to </= 30 seconds to demonstrate improved functional strength    Baseline 49.56 secs w/ UE support    Time 4    Period Weeks    Status New      PT SHORT TERM GOAL #5   Title Berg Balance to be assessed and LTG to be set as appropriate    Baseline baseline established LTG set.    Time 4    Period Weeks    Status Achieved             PT Long Term Goals - 01/01/20 1255      PT LONG TERM GOAL #1   Title Patient will be independent with final HEP for strengthening/balance (All LTG's due: 02/19/20)    Baseline HEP established    Time 8    Period Weeks    Status New      PT LONG TERM GOAL #2   Title Patient will demonstrate ability to ambulate >1000 on indoor/outdoor surfaces with LRAD and supervision for improved community mobility    Baseline 100 ft CGA    Time 8    Period Weeks    Status New      PT LONG TERM GOAL #3   Title Patient will improve gait speed to >/= 2.0 ft/sec  with LRAD to demonstrate improved community ambulation    Baseline 0.69 ft/sec    Time 8    Period Weeks    Status New      PT LONG TERM GOAL #4   Title Patient will improve 5x sit <> stand to </= 20 secs to demonstrate improved functional mobility    Baseline 49.56 secs    Time 8    Period Weeks    Status New      PT LONG TERM GOAL #5   Title  Patient will improve Tug to </= 15 seconds with LRAD to demo improved mobility and reduced fall risk    Baseline 35.84 secs    Time 8    Period Weeks    Status New      PT LONG TERM GOAL #6   Title Patient will improve Berg Balance to >/= 45/56 to demonstrate reduced fall risk and improved balance    Baseline 39/56    Time 8    Period Weeks    Status New                 Plan - 01/03/20 5366    Clinical Impression Statement Today's skilled session included continued gait training without AD with focus on improved step length and weight shift. Continued BLE strenghtening and balance exercises. Internal dizziness reported by patient with reps of vertical head turns at this time. PT educated on completion of horizontal head turns safely at home and added to HEP. Will continue to progress toward goals.    Personal Factors and Comorbidities Comorbidity 3+    Comorbidities hypertension, hyperlipidemia, depression/anxiety, degenerative disc disease in lumbar region, obesity, sleep apnea    Examination-Activity Limitations Bend;Transfers;Stairs;Stand;Lift    Examination-Participation Restrictions Occupation;Yard Work;Community Activity;Driving    Stability/Clinical Decision Making Evolving/Moderate complexity    Rehab Potential Good    PT Frequency 2x / week    PT Duration 8  weeks    PT Treatment/Interventions ADLs/Self Care Home Management;Aquatic Therapy;Cryotherapy;Electrical Stimulation;Moist Heat;DME Instruction;Gait training;Stair training;Functional mobility training;Therapeutic activities;Therapeutic exercise;Balance training;Neuromuscular re-education;Patient/family education;Orthotic Fit/Training;Manual techniques;Passive range of motion;Vestibular    PT Next Visit Plan Continue gait without AD. Functional [place] (standing), Balance Exercises. Slowly incorporating vision removed, retropulsion noted with EC. Recieved AFO?    Consulted and Agree with Plan of Care Patient;Family  member/caregiver    Family Member Consulted Wife           Patient will benefit from skilled therapeutic intervention in order to improve the following deficits and impairments:  Abnormal gait, Decreased balance, Decreased endurance, Difficulty walking, Impaired sensation, Dizziness, Decreased range of motion, Decreased activity tolerance, Decreased knowledge of use of DME, Decreased strength, Pain  Visit Diagnosis: Hemiplegia and hemiparesis following cerebral infarction affecting right dominant side (HCC)  Other abnormalities of gait and mobility  Muscle weakness (generalized)  Foot drop, right  Unsteadiness on feet     Problem List Patient Active Problem List   Diagnosis Date Noted  . Transaminitis   . PFO (patent foramen ovale) 11/22/2019  . Headache 11/22/2019  . Cerebral edema (HCC) 11/22/2019  . Chest pain 11/22/2019  . Hyperlipidemia LDL goal <70 11/22/2019  . OSA (obstructive sleep apnea) 11/22/2019  . 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 Vascular headache   . Dyslipidemia   . Hypertension   . Intractable nausea and vomiting   . Hypokalemia   . Depression with anxiety   . Ischemic stroke (HCC) - L PICA infarct embolic in setting of PFO s/p crani   . Lumbar spondylolysis 07/31/2019  . Lumbar radiculopathy 04/07/2019  . Right foot drop 04/07/2019  . Backache 07/05/2017  . DDD (degenerative disc disease), lumbar 07/05/2017  . Spinal stenosis of lumbar region 07/05/2017    Tempie DonningKaitlyn B Jaequan Propes, PT, DPT 01/03/2020, 9:02 AM  [facility] [address] [place], [place], [address] Phone: [phone]   Fax:  [phone]  Name: Manuela SchwartzMichael J Tutson MRN: [id number] Date of Birth: 12-Dec-1970

## 2020-01-05 ENCOUNTER — Ambulatory Visit: Payer: 59

## 2020-01-08 ENCOUNTER — Other Ambulatory Visit: Payer: Self-pay | Admitting: Physical Medicine & Rehabilitation

## 2020-01-08 ENCOUNTER — Other Ambulatory Visit: Payer: Self-pay

## 2020-01-08 ENCOUNTER — Ambulatory Visit: Payer: 59 | Admitting: Occupational Therapy

## 2020-01-08 DIAGNOSIS — I69351 Hemiplegia and hemiparesis following cerebral infarction affecting right dominant side: Secondary | ICD-10-CM | POA: Diagnosis not present

## 2020-01-08 DIAGNOSIS — R41842 Visuospatial deficit: Secondary | ICD-10-CM

## 2020-01-08 DIAGNOSIS — R2681 Unsteadiness on feet: Secondary | ICD-10-CM

## 2020-01-08 DIAGNOSIS — I69318 Other symptoms and signs involving cognitive functions following cerebral infarction: Secondary | ICD-10-CM

## 2020-01-08 DIAGNOSIS — R278 Other lack of coordination: Secondary | ICD-10-CM

## 2020-01-08 NOTE — Therapy (Signed)
[facility] [address] [place], [place], [address] Phone: [phone]   Fax:  [phone]  Occupational Therapy Evaluation  Patient Details  Name: [name] MRN: [id number] Date of Birth: Jul 13, 1970 No data recorded  Encounter Date: 01/08/2020   OT End of Session - 01/08/20 1448    Visit Number 1    Number of Visits 9    Authorization Type [employer] - 60 visit combined PT/OT/ST limit (hard max)    OT Start Time 1100    OT Stop Time 1145    OT Time Calculation (min) 45 min    Activity Tolerance Patient tolerated treatment well    Behavior During Therapy [facility] for tasks assessed/performed           Past Medical History:  Diagnosis Date  . Arthritis    neck  . Carpal tunnel syndrome on both sides 08/2014  . DDD (degenerative disc disease), lumbar   . History of kidney stones   . Hypertension    under control with med., has been on med. x 1 yr.  . Sleep apnea    CPAP but does not use  . Stroke [facility]) 11/16/2019    Past Surgical History:  Procedure Laterality Date  . BUBBLE STUDY  11/21/2019   Procedure: BUBBLE STUDY;  Surgeon: [name], MD;  Location: [facility] ENDOSCOPY;  Service: Cardiovascular;;  . CARPAL TUNNEL RELEASE Left 09/07/2014   Procedure: LEFT CARPAL TUNNEL RELEASE;  Surgeon: [name], MD;  Location: [facility];  Service: Orthopedics;  Laterality: Left;  . CARPAL TUNNEL RELEASE Right 10/12/2014   Procedure: RIGHT CARPAL TUNNEL RELEASE;  Surgeon: [name], MD;  Location: [facility];  Service: Orthopedics;  Laterality: Right;  . KNEE ARTHROSCOPY Left   . SUBOCCIPITAL CRANIECTOMY CERVICAL LAMINECTOMY N/A 11/17/2019   Procedure: SUBOCCIPITAL DECOMPRESSIVE CRANIECTOMY;  Surgeon: [name], MD;  Location: [facility] OR;  Service: Neurosurgery;  Laterality: N/A;  . TEE WITHOUT CARDIOVERSION N/A 11/21/2019   Procedure: TRANSESOPHAGEAL ECHOCARDIOGRAM (TEE);   Surgeon: [name], MD;  Location: [facility] ENDOSCOPY;  Service: Cardiovascular;  Laterality: N/A;    There were no vitals filed for this visit.   Subjective Assessment - 01/08/20 1113    Subjective  I sometimes get headaches if I've over done it    Patient is accompanied by: Family member   wife   Pertinent History LT PICA infarct. PMH: HTN, HLD    Limitations No driving    Currently in Pain? No/denies             [facility] OT Assessment - 01/08/20 0001      Assessment   Medical Diagnosis L PICA Infarct    Onset Date/Surgical Date 11/16/19    Hand Dominance Right    Prior Therapy Inpatient Rehab.       Precautions   Precautions Fall    Precaution Comments No driving, pt reports diplopia has almost dissolved      Balance Screen   Has the patient fallen in the past 6 months No      Home  Environment   Bathroom Shower/Tub Tub/Shower unit;Curtain   grab bars, tub bench (doesn't use bench)   Additional Comments Pt lives in 1 story home, 2 steps to enter with rails    Lives With Spouse      Prior Function   Level of Independence Independent    Vocation Full time employment    Engineer, drilling at [employer]  Leisure Works on [employer]      ADL   Eating/Feeding Independent    Grooming Independent    Hydrographic surveyorUpper Body Bathing Supervision/safety    Lower Body Bathing Supervision/safety    Upper Body Dressing Independent    Lower Body Dressing Supervision/safety   for Teaching laboratory technicianbalance   Toilet Transfer Independent    Toileting -  Database administratorHygiene Independent    Tub/Shower Transfer Supervision/safety   to CGA     IADL   Shopping Needs to be accompanied on any shopping trip   Got disoriented   Light Housekeeping --   Pt folds laundry and washes some dishes   Meal Prep Needs to have meals prepared and served   Pt used to [employer]    Community Mobility Relies on family or friends for transportation    Medication Management Has difficulty remembering to take medication        Mobility   Mobility Status Needs assist    Mobility Status Comments cane in house, rollator in community      Written Expression   Dominant Hand Right    Handwriting --   reports this has resolved     Vision - History   Baseline Vision Wears glasses for distance only      Vision Assessment   Ocular Range of Motion Within Functional Limits    Tracking/Visual Pursuits Able to track stimulus in all quads without difficulty    Convergence Impaired - to be further tested in functional context    Visual Fields No apparent deficits    Diplopia Assessment --   Resolved     Cognition   Cognition Comments Pt may have visual attention deficits as well and appears slightly impulsive - will need to further assess in functional context. Wife also has to assist in medication management      Observation/Other Assessments   Observations Pt hypersensitive to noise, light, visual stimuli      Sensation   Light Touch Appears Intact      Coordination   9 Hole Peg Test Right;Left    Right 9 Hole Peg Test 29.13 sec    Left 9 Hole Peg Test 31.50 sec      Edema   Edema none      ROM / Strength   AROM / PROM / Strength AROM;Strength      AROM   Overall AROM Comments BUE AROM WNL's      Strength   Overall Strength Comments BUE MMT grossly 5/5      Hand Function   Right Hand Grip (lbs) 125.8 lbs    Left Hand Grip (lbs) 124.3 lbs                                OT Long Term Goals - 01/08/20 1457      OT LONG TERM GOAL #1   Title Pt will perform environmental scanning w/ DME prn at 90% accuracy with dizziness less than or equal to 3/10    Time 8    Period Weeks    Status New      OT LONG TERM GOAL #2   Title Pt will read page of text (larger print prn) with only min difficulty    Time 8    Period Weeks    Status New      OT LONG TERM GOAL #3   Title Pt will perform dynamic standing activities (reaching low/high)  w/ one hand countertop support and no LOB     Time 8    Period Weeks    Status New      OT LONG TERM GOAL #4   Title Pt will verbalize understanding with memory strategies and other compensations prn    Time 8    Period Weeks    Status New      OT LONG TERM GOAL #5   Title Pt/family will verbalize understanding of driving eval info prn    Time 8    Period Weeks    Status New                 Plan - 01/08/20 1451    Clinical Impression Statement Pt is a 49 y.o. male who presents to OPOT s/p Lt PICA infarct with deficits in balance, vestibular, and mild visual deficits (diplopia has resolved) but appears to have mild convergence/divergence problems. Pt may also have mild cognitive deficits. Pt would benefit from O.T. to address these deficits as it relates to function.    OT Occupational Profile and History Problem Focused Assessment - Including review of records relating to presenting problem    Occupational performance deficits (Please refer to evaluation for details): IADL's;Work;Leisure    Body Structure / Function / Physical Skills IADL;Vestibular;Coordination;Vision    Cognitive Skills Memory;Attention;Safety Awareness    Rehab Potential Excellent    Clinical Decision Making Limited treatment options, no task modification necessary    Comorbidities Affecting Occupational Performance: None    Modification or Assistance to Complete Evaluation  No modification of tasks or assist necessary to complete eval    OT Frequency 2x / week   however most likely will only see 1x/wk due to scheduling conflicts and family preference   OT Duration 8 weeks    OT Treatment/Interventions Self-care/ADL training;Functional Mobility Training;Neuromuscular education;Therapeutic activities;Coping strategies training;Visual/perceptual remediation/compensation;Patient/family education;DME and/or AE instruction;Cognitive remediation/compensation    Plan work towards goals    Consulted and Agree with Plan of Care Patient;Family member/caregiver      Family Member Consulted wife           Patient will benefit from skilled therapeutic intervention in order to improve the following deficits and impairments:   Body Structure / Function / Physical Skills: IADL, Vestibular, Coordination, Vision Cognitive Skills: Memory, Attention, Safety Awareness     Visit Diagnosis: Unsteadiness on feet  Visuospatial deficit  Other lack of coordination  Other symptoms and signs involving cognitive functions following cerebral infarction    Problem List Patient Active Problem List   Diagnosis Date Noted  . Transaminitis   . PFO (patent foramen ovale) 11/22/2019  . Headache 11/22/2019  . Cerebral edema (HCC) 11/22/2019  . Chest pain 11/22/2019  . Hyperlipidemia LDL goal <70 11/22/2019  . OSA (obstructive sleep apnea) 11/22/2019  . 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 Vascular headache   . Dyslipidemia   . Hypertension   . Intractable nausea and vomiting   . Hypokalemia   . Depression with anxiety   . Ischemic stroke (HCC) - L PICA infarct embolic in setting of PFO s/p crani   . Lumbar spondylolysis 07/31/2019  . Lumbar radiculopathy 04/07/2019  . Right foot drop 04/07/2019  . Backache 07/05/2017  . DDD (degenerative disc disease), lumbar 07/05/2017  . Spinal stenosis of lumbar region 07/05/2017    Kelli Churn, OTR/L 01/08/2020, 3:02 PM  [facility] [address] [place],  [place], [address] Phone: [phone]   Fax:  [phone]  Name: [name] MRN: [id number] Date of Birth: 1970/05/20

## 2020-01-09 ENCOUNTER — Ambulatory Visit: Payer: 59 | Admitting: Physical Therapy

## 2020-01-10 ENCOUNTER — Telehealth: Payer: Self-pay | Admitting: Cardiovascular Disease

## 2020-01-10 NOTE — Telephone Encounter (Signed)
New message     Wife dropped off a bubble study result tested by the neuro doctor.  She want to talk to the nurse today if possible about scheduling the procedure sooner than later.

## 2020-01-11 ENCOUNTER — Other Ambulatory Visit: Payer: Self-pay

## 2020-01-11 ENCOUNTER — Ambulatory Visit: Payer: 59 | Admitting: Occupational Therapy

## 2020-01-11 ENCOUNTER — Telehealth: Payer: Self-pay | Admitting: Cardiovascular Disease

## 2020-01-11 DIAGNOSIS — I69318 Other symptoms and signs involving cognitive functions following cerebral infarction: Secondary | ICD-10-CM

## 2020-01-11 DIAGNOSIS — R41842 Visuospatial deficit: Secondary | ICD-10-CM

## 2020-01-11 DIAGNOSIS — I69351 Hemiplegia and hemiparesis following cerebral infarction affecting right dominant side: Secondary | ICD-10-CM | POA: Diagnosis not present

## 2020-01-11 NOTE — Telephone Encounter (Signed)
Wife of the patient called. She wanted to get her husband scheduled for his procedure asap.

## 2020-01-11 NOTE — Telephone Encounter (Signed)
Left message that the next available day is 9/15 and they will be called tomorrow to discuss a plan.

## 2020-01-11 NOTE — Therapy (Signed)
[facility] [address] [place], [place], [address] Phone: [phone]   Fax:  [phone]  Occupational Therapy Treatment  Patient Details  Name: [name] MRN: [id number] Date of Birth: 1970/10/17 No data recorded  Encounter Date: 01/11/2020   OT End of Session - 01/11/20 1214    Visit Number 2    Number of Visits 9    Authorization Type [employer] - 41 visit combined PT/OT/ST limit (hard max)    OT Start Time 1015    OT Stop Time 1100    OT Time Calculation (min) 45 min    Activity Tolerance Patient tolerated treatment well    Behavior During Therapy [facility] for tasks assessed/performed           Past Medical History:  Diagnosis Date  . Arthritis    neck  . Carpal tunnel syndrome on both sides 08/2014  . DDD (degenerative disc disease), lumbar   . History of kidney stones   . Hypertension    under control with med., has been on med. x 1 yr.  . Sleep apnea    CPAP but does not use  . Stroke [facility]) 11/16/2019    Past Surgical History:  Procedure Laterality Date  . BUBBLE STUDY  11/21/2019   Procedure: BUBBLE STUDY;  Surgeon: Buford Dresser, MD;  Location: [place];  Service: Cardiovascular;;  . CARPAL TUNNEL RELEASE Left 09/07/2014   Procedure: LEFT CARPAL TUNNEL RELEASE;  Surgeon: Roseanne Kaufman, MD;  Location: [place];  Service: Orthopedics;  Laterality: Left;  . CARPAL TUNNEL RELEASE Right 10/12/2014   Procedure: RIGHT CARPAL TUNNEL RELEASE;  Surgeon: Roseanne Kaufman, MD;  Location: [place];  Service: Orthopedics;  Laterality: Right;  . KNEE ARTHROSCOPY Left   . SUBOCCIPITAL CRANIECTOMY CERVICAL LAMINECTOMY N/A 11/17/2019   Procedure: SUBOCCIPITAL DECOMPRESSIVE CRANIECTOMY;  Surgeon: Judith Part, MD;  Location: [place];  Service: Neurosurgery;  Laterality: N/A;  . TEE WITHOUT CARDIOVERSION N/A 11/21/2019   Procedure: TRANSESOPHAGEAL ECHOCARDIOGRAM (TEE);   Surgeon: Buford Dresser, MD;  Location: [facility] ENDOSCOPY;  Service: Cardiovascular;  Laterality: N/A;    There were no vitals filed for this visit.   Subjective Assessment - 01/11/20 1104    Subjective  I wasn't completely honest last time; I hurt all over since my stroke. It's worse in the mornings. It may be lack of activity but it takes me a while to get moving. I use the hot tub almost everyday    Patient is accompanied by: Family member   wife   Pertinent History LT PICA infarct. PMH: HTN, HLD    Limitations No driving    Currently in Pain? Yes    Pain Score 6     Pain Location Generalized    Pain Descriptors / Indicators Headache;Sore    Pain Type Acute pain    Pain Onset More than a month ago    Pain Frequency Intermittent    Aggravating Factors  worse in am, sitting too long    Pain Relieving Factors hot tub           See below for all education.  Pt also practiced reading 1/4 page of text (larger print, lines spaced) with no difficulty.                      OT Education - 01/11/20 1114    Education Details Reviewed goals/POC, memory strategies/cognitive tips, convergence/divergence ex    Person(s)  Educated Patient    Methods Explanation;Handout   demo convergence   Comprehension Verbalized understanding               OT Long Term Goals - 01/11/20 1215      OT LONG TERM GOAL #1   Title Pt will perform environmental scanning w/ DME prn at 90% accuracy with dizziness less than or equal to 3/10    Time 8    Period Weeks    Status New      OT LONG TERM GOAL #2   Title Pt will read page of text (larger print prn) with only min difficulty    Time 8    Period Weeks    Status On-going      OT LONG TERM GOAL #3   Title Pt will perform dynamic standing activities (reaching low/high) w/ one hand countertop support and no LOB    Time 8    Period Weeks    Status New      OT LONG TERM GOAL #4   Title Pt will verbalize understanding with  memory strategies and other compensations prn    Time 8    Period Weeks    Status Achieved      OT LONG TERM GOAL #5   Title Pt/family will verbalize understanding of driving eval info prn    Time 8    Period Weeks    Status New                 Plan - 01/11/20 1215    Clinical Impression Statement Pt has met STG #4. Pt progressing towards reading goal    OT Occupational Profile and History Problem Focused Assessment - Including review of records relating to presenting problem    Occupational performance deficits (Please refer to evaluation for details): IADL's;Work;Leisure    Body Structure / Function / Physical Skills IADL;Vestibular;Coordination;Vision    Cognitive Skills Memory;Attention;Safety Awareness    Rehab Potential Excellent    Clinical Decision Making Limited treatment options, no task modification necessary    Comorbidities Affecting Occupational Performance: None    Modification or Assistance to Complete Evaluation  No modification of tasks or assist necessary to complete eval    OT Frequency 2x / week   however most likely will only see 1x/wk due to scheduling conflicts and family preference   OT Duration 8 weeks    OT Treatment/Interventions Self-care/ADL training;Functional Mobility Training;Neuromuscular education;Therapeutic activities;Coping strategies training;Visual/perceptual remediation/compensation;Patient/family education;DME and/or AE instruction;Cognitive remediation/compensation    Plan practice environmental scanning and functional balance    Consulted and Agree with Plan of Care Patient;Family member/caregiver    Family Member Consulted wife           Patient will benefit from skilled therapeutic intervention in order to improve the following deficits and impairments:   Body Structure / Function / Physical Skills: IADL, Vestibular, Coordination, Vision Cognitive Skills: Memory, Attention, Safety Awareness     Visit Diagnosis: Other  symptoms and signs involving cognitive functions following cerebral infarction  Visuospatial deficit    Problem List Patient Active Problem List   Diagnosis Date Noted  . Transaminitis   . PFO (patent foramen ovale) 11/22/2019  . Headache 11/22/2019  . Cerebral edema (HCC) 11/22/2019  . Chest pain 11/22/2019  . Hyperlipidemia LDL goal <70 11/22/2019  . OSA (obstructive sleep apnea) 11/22/2019  . 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 Vascular headache   . Dyslipidemia   .  Hypertension   . Intractable nausea and vomiting   . Hypokalemia   . Depression with anxiety   . Ischemic stroke ([place]) - L PICA infarct embolic in setting of PFO s/p crani   . Lumbar spondylolysis 07/31/2019  . Lumbar radiculopathy 04/07/2019  . Right foot drop 04/07/2019  . Backache 07/05/2017  . DDD (degenerative disc disease), lumbar 07/05/2017  . Spinal stenosis of lumbar region 07/05/2017    Carey Bullocks, OTR/L  01/11/2020, 3:42 PM  [facility] [address] [place], [place], [address] Phone: [phone]   Fax:  [phone]  Name: BERNAL LUHMAN MRN: [id number] Date of Birth: [birth year]

## 2020-01-11 NOTE — Patient Instructions (Signed)
Memory Compensation Strategies  1. Use "WARM" strategy.  W= write it down  A= associate it  R= repeat it  M= make a mental note  2.   You can keep a Memory Notebook.  Use a 3-ring notebook with sections for the following: calendar, important names and phone numbers,  medications, doctors' names/phone numbers, lists/reminders, and a section to journal what you did  each day.   3.    Use a calendar to write appointments down.  4.    Write yourself a schedule for the day.  This can be placed on the calendar or in a separate section of the Memory Notebook.  Keeping a  regular schedule can help memory.  5.    Use medication organizer with sections for each day or morning/evening pills.  You may need help loading it  6.    Keep a basket, or pegboard by the door.  Place items that you need to take out with you in the basket or on the pegboard.  You may also want to  include a message board for reminders.  7.    Use sticky notes.  Place sticky notes with reminders in a place where the task is performed.  For example: " turn off the  stove" placed by the stove, "lock the door" placed on the door at eye level, " take your medications" on  the bathroom mirror or by the place where you normally take your medications.  8.    Use alarms/timers.  Use while cooking to remind yourself to check on food or as a reminder to take your medicine, or as a  reminder to make a call, or as a reminder to perform another task, etc.     Cognitive Tips  Keep a journal/notebook with sections for the following (or use sections separately as needed):  Calendar and appointment sheet, schedule for each day, lists of reminders (such as grocery lists or "to do" list), homework assignments for therapy, important information such as family and friends names / addresses / phone numbers, medications, medical history and doctors name / phone numbers.  Avoid / remove clutter and unnecessary items from areas such as  countertops / cabinets in kitchen and bathroom, closets, etc.  Organize items by purpose.  Baskets and bins help with this.  Leave notes for reminders above task to be completed.  For example: Note to turn off stove over the the stove; note to lock door beside the door, not to brush teeth then wash face by sink, note to take medication on table etc.  To help recall names of people of people or things, mentally or verbally go through the alphabet to try to determine the 1st letter of the word as this may trigger the name or word you are looking for.  If this is too difficult and someone else knows the word, have them give you the first letter by asking, "does it start with an "A", "B", "C" etc. (or have them give you the first sound of the word or some other clue).  Review family events, occasions, names, etc.  Pictures are a good way to trigger memory.  Have others correct you if you answer something incorrectly.  Have them speak slowly with a few words to give you time to process and respond.  Don't let others automatically problem-solve. (For example: don't let them automatically lay out clothes in the correct position, but hand it to you folded so that you can figure   it out for yourself.)  However, if you need help with tasks, they should give you as little as they can so that you can be successful.  If appropriate and safe, they may allow you to make mistakes so that you can figure out how to correct the error.  (For example, they may allow you to put your shoes on the wrong foot to see if you notice that is wrong).  If you struggle, they should give you a cue.  (Example: "Do your shoes feel right?"  "Do they look right?")  

## 2020-01-12 ENCOUNTER — Ambulatory Visit: Payer: 59

## 2020-01-12 DIAGNOSIS — M6281 Muscle weakness (generalized): Secondary | ICD-10-CM

## 2020-01-12 DIAGNOSIS — M21371 Foot drop, right foot: Secondary | ICD-10-CM

## 2020-01-12 DIAGNOSIS — R2689 Other abnormalities of gait and mobility: Secondary | ICD-10-CM

## 2020-01-12 DIAGNOSIS — R2681 Unsteadiness on feet: Secondary | ICD-10-CM

## 2020-01-12 DIAGNOSIS — I69351 Hemiplegia and hemiparesis following cerebral infarction affecting right dominant side: Secondary | ICD-10-CM | POA: Diagnosis not present

## 2020-01-12 NOTE — Therapy (Signed)
[facility] [address] [place], [place], [address] Phone: [phone]   Fax:  [phone]  Physical Therapy Treatment  Patient Details  Name: [name] MRN: [id number] Date of Birth: October 28, 1970 Referring Provider (PT): Referred by: Henry Dollar, [name], being followed by: Henry Laws, MD   Encounter Date: 01/12/2020   PT End of Session - 01/12/20 9379    Visit Number 5    Number of Visits 17    Date for PT Re-Evaluation 03/24/20   POC for 8 weeks, Cert for 90 days   Authorization Type [employer]    PT Start Time [phone]    PT Stop Time 1013    PT Time Calculation (min) 42 min    Equipment Utilized During Treatment Gait belt    Activity Tolerance Patient tolerated treatment well;No increased pain    Behavior During Therapy WFL for tasks assessed/performed           Past Medical History:  Diagnosis Date  . Arthritis    neck  . Carpal tunnel syndrome on both sides 08/2014  . DDD (degenerative disc disease), lumbar   . History of kidney stones   . Hypertension    under control with med., has been on med. x 1 yr.  . Sleep apnea    CPAP but does not use  . Stroke [facility]) 11/16/2019    Past Surgical History:  Procedure Laterality Date  . BUBBLE STUDY  11/21/2019   Procedure: BUBBLE STUDY;  Surgeon: Henry Red, MD;  Location: [facility] ENDOSCOPY;  Service: Cardiovascular;;  . CARPAL TUNNEL RELEASE Left 09/07/2014   Procedure: LEFT CARPAL TUNNEL RELEASE;  Surgeon: Henry Severin, MD;  Location: [facility];  Service: Orthopedics;  Laterality: Left;  . CARPAL TUNNEL RELEASE Right 10/12/2014   Procedure: RIGHT CARPAL TUNNEL RELEASE;  Surgeon: Henry Severin, MD;  Location: [facility];  Service: Orthopedics;  Laterality: Right;  . KNEE ARTHROSCOPY Left   . SUBOCCIPITAL CRANIECTOMY CERVICAL LAMINECTOMY N/A 11/17/2019   Procedure: SUBOCCIPITAL DECOMPRESSIVE CRANIECTOMY;   Surgeon: Henry Pierini, MD;  Location: [facility] OR;  Service: Neurosurgery;  Laterality: N/A;  . TEE WITHOUT CARDIOVERSION N/A 11/21/2019   Procedure: TRANSESOPHAGEAL ECHOCARDIOGRAM (TEE);  Surgeon: Henry Red, MD;  Location: [facility] ENDOSCOPY;  Service: Cardiovascular;  Laterality: N/A;    There were no vitals filed for this visit.   Subjective Assessment - 01/12/20 0934    Subjective Patient reports is picking up AFO next week. Patient reports double vision has went away. Is wearing glasses today and feels like he can see better.    Patient is accompained by: Family member   Wife [name]   Pertinent History hypertension, hyperlipidemia, depression/anxiety, degenerative disc disease in lumbar region, obesity, sleep apnea    Limitations Walking;Standing    Patient Stated Goals Back to Normal, Do the things I did prior    Currently in Pain? Yes    Pain Score 6     Pain Location Generalized    Pain Orientation Other (Comment)   all over   Pain Descriptors / Indicators Sore    Pain Type Acute pain                             [facility] Adult PT Treatment/Exercise - 01/12/20 0001      Transfers   Transfers Sit to Stand;Stand to Sit    Sit to Stand 5: Supervision  Stand to Sit 5: Supervision    Comments completed sit <> stand with RLE positioned posteriorly x 10 reps. completed without UE support. pt reports mild soreness in back with ocmpletion, demo proper posture throughout.       Ambulation/Gait   Ambulation/Gait Yes    Ambulation/Gait Assistance 5: Supervision;4: Min guard    Ambulation/Gait Assistance Details completed gait training without AD, patient requiring intermittent CGA throughout ambulation. Verbal cues for improved step length.     Ambulation Distance (Feet) 500 Feet    Assistive device None    Gait Pattern Step-through pattern;Decreased step length - left;Decreased stance time - right;Decreased dorsiflexion - right;Right steppage;Wide base of  support;Poor foot clearance - right    Ambulation Surface Level;Indoor           Vestibular Treatment/Exercise - 01/12/20 0001      Vestibular Treatment/Exercise   Vestibular Treatment Provided Gaze    Gaze Exercises X1 Viewing Horizontal;X1 Viewing Vertical      X1 Viewing Horizontal   Foot Position seated without back support    Reps 4    Comments x 30 seconds on plain background. PT educating on proper range and frequency of head turns with completion.       X1 Viewing Vertical   Foot Position seated without back support    Reps 3    Comments x 30 seconds on plain background. PT educating on proper range and frequency of head turns with completion. mild discomfort at back of neck due to scar tissue, PT educating to complete with smaller range.               Balance Exercises - 01/12/20 0001      Balance Exercises: Standing   Standing Eyes Opened Narrow base of support (BOS);Foam/compliant surface;3 reps;30 secs    Standing Eyes Opened Limitations completed with narrow BOS and eyes open on holding balance and remaining steady without UE support. pt require instances of UE support due to increased sway    Standing Eyes Closed Wide (BOA);Foam/compliant surface;3 reps;Time;Limitations    Standing Eyes Closed Time < 10 seconds prior to require UE support. increased sway noted     Rockerboard Anterior/posterior;EO;30 seconds;5 reps;Intermittent UE support;Limitations    Rockerboard Limitations standing on rockerboard focused on holding steady with eyes open,  4 x 25-30 seconds. intermittent UE support required due to increased sway noted.              PT Education - 01/12/20 1111    Education Details HEP update (see patient instructions)    Person(s) Educated Patient    Methods Explanation;Handout;Demonstration    Comprehension Verbalized understanding;Need further instruction;Returned demonstration            PT Short Term Goals - 01/01/20 1254      PT SHORT TERM  GOAL #1   Title patient will be independent with initial HEP for strengthening/balance (All STG's Due: 01/22/20)    Baseline no HEP established    Time 4    Period Weeks    Status New    Target Date 01/22/20      PT SHORT TERM GOAL #2   Title Patient will demo ability to ambulate >500 ft on indoor level surfaces with LRAD and supervision to demo improved household mobility    Baseline 100, CGA with Rollator    Time 4    Period Weeks    Status New      PT SHORT TERM GOAL #3   Title  Patient will improve TUG score w/ LRAD to </= 25 seconds to demo improved functional mobility    Baseline 35.84 secs    Time 4    Period Weeks    Status New      PT SHORT TERM GOAL #4   Title Patient will improve 5x sit <> stand to </= 30 seconds to demonstrate improved functional strength    Baseline 49.56 secs w/ UE support    Time 4    Period Weeks    Status New      PT SHORT TERM GOAL #5   Title Berg Balance to be assessed and LTG to be set as appropriate    Baseline baseline established LTG set.    Time 4    Period Weeks    Status Achieved             PT Long Term Goals - 01/01/20 1255      PT LONG TERM GOAL #1   Title Patient will be independent with final HEP for strengthening/balance (All LTG's due: 02/19/20)    Baseline HEP established    Time 8    Period Weeks    Status New      PT LONG TERM GOAL #2   Title Patient will demonstrate ability to ambulate >1000 on indoor/outdoor surfaces with LRAD and supervision for improved community mobility    Baseline 100 ft CGA    Time 8    Period Weeks    Status New      PT LONG TERM GOAL #3   Title Patient will improve gait speed to >/= 2.0 ft/sec  with LRAD to demonstrate improved community ambulation    Baseline 0.69 ft/sec    Time 8    Period Weeks    Status New      PT LONG TERM GOAL #4   Title Patient will improve 5x sit <> stand to </= 20 secs to demonstrate improved functional mobility    Baseline 49.56 secs    Time 8     Period Weeks    Status New      PT LONG TERM GOAL #5   Title Patient will improve Tug to </= 15 seconds with LRAD to demo improved mobility and reduced fall risk    Baseline 35.84 secs    Time 8    Period Weeks    Status New      PT LONG TERM GOAL #6   Title Patient will improve Berg Balance to >/= 45/56 to demonstrate reduced fall risk and improved balance    Baseline 39/56    Time 8    Period Weeks    Status New                 Plan - 01/12/20 1112    Clinical Impression Statement Continued gait training today without AD and focused on improved step length. Patient should have personal AFO next week. Continued balance activites on rockerboard to promote hip/ankle strategies. Initiated VOR x 1 in seated with patient overall tolerating well, mild dizziness reported. Will continue to progress toward goals.    Personal Factors and Comorbidities Comorbidity 3+    Comorbidities hypertension, hyperlipidemia, depression/anxiety, degenerative disc disease in lumbar region, obesity, sleep apnea    Examination-Activity Limitations Bend;Transfers;Stairs;Stand;Lift    Examination-Participation Restrictions Occupation;Yard Work;Community Activity;Driving    Stability/Clinical Decision Making Evolving/Moderate complexity    Rehab Potential Good    PT Frequency 2x / week    PT Duration  8 weeks    PT Treatment/Interventions ADLs/Self Care Home Management;Aquatic Therapy;Cryotherapy;Electrical Stimulation;Moist Heat;DME Instruction;Gait training;Stair training;Functional mobility training;Therapeutic activities;Therapeutic exercise;Balance training;Neuromuscular re-education;Patient/family education;Orthotic Fit/Training;Manual techniques;Passive range of motion;Vestibular    PT Next Visit Plan Continue gait without AD. Functional [place] (standing), Balance Exercises. Slowly incorporating vision removed, retropulsion noted with EC. Recieved AFO?    Consulted and Agree with Plan of Care  Patient;Family member/caregiver    Family Member Consulted Wife           Patient will benefit from skilled therapeutic intervention in order to improve the following deficits and impairments:  Abnormal gait, Decreased balance, Decreased endurance, Difficulty walking, Impaired sensation, Dizziness, Decreased range of motion, Decreased activity tolerance, Decreased knowledge of use of DME, Decreased strength, Pain  Visit Diagnosis: Other abnormalities of gait and mobility  Muscle weakness (generalized)  Foot drop, right  Unsteadiness on feet  Hemiplegia and hemiparesis following cerebral infarction affecting right dominant side [facility])     Problem List Patient Active Problem List   Diagnosis Date Noted  . Transaminitis   . PFO (patent foramen ovale) 11/22/2019  . Headache 11/22/2019  . Cerebral edema (HCC) 11/22/2019  . Chest pain 11/22/2019  . Hyperlipidemia LDL goal <70 11/22/2019  . OSA (obstructive sleep apnea) 11/22/2019  . 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 Vascular headache   . Dyslipidemia   . Hypertension   . Intractable nausea and vomiting   . Hypokalemia   . Depression with anxiety   . Ischemic stroke ([facility]) - L PICA infarct embolic in setting of PFO s/p crani   . Lumbar spondylolysis 07/31/2019  . Lumbar radiculopathy 04/07/2019  . Right foot drop 04/07/2019  . Backache 07/05/2017  . DDD (degenerative disc disease), lumbar 07/05/2017  . Spinal stenosis of lumbar region 07/05/2017    Tempie Donning, PT, DPT 01/12/2020, 11:14 AM  [facility] [address] [place], [place], [address] Phone: [phone]   Fax:  [phone]  Name: [name] MRN: [id number] Date of Birth: Sep 06, 1970

## 2020-01-12 NOTE — Telephone Encounter (Signed)
Spoke with Dr. Excell Seltzer, who agrees to closure without follow-up visit (he will update H&P day of procedure). The patient's wife (DPR) agrees to closure 9/15 and labs and Covid test 9/13. She will make sure she can access MyChart over the weekend so instructions may be sent next week. She was grateful for assistance.

## 2020-01-12 NOTE — Patient Instructions (Signed)
Gaze Stabilization: Sitting    Keeping eyes on target on wall approx. 5 feet away, tilt head down 15-30 and move head side to side for 30 - 45 seconds. Repeat while moving head up and down for 30 -45 seconds. Do 2-3 sessions per day. Repeat using target on plain background.  Copyright  [employer]. All rights reserved.

## 2020-01-12 NOTE — Telephone Encounter (Signed)
[name] called back following up on scheduling and is awaiting your call.    Stated thank you.

## 2020-01-15 ENCOUNTER — Ambulatory Visit: Payer: 59

## 2020-01-15 ENCOUNTER — Other Ambulatory Visit: Payer: Self-pay

## 2020-01-15 DIAGNOSIS — I69351 Hemiplegia and hemiparesis following cerebral infarction affecting right dominant side: Secondary | ICD-10-CM | POA: Diagnosis not present

## 2020-01-15 DIAGNOSIS — R2681 Unsteadiness on feet: Secondary | ICD-10-CM

## 2020-01-15 DIAGNOSIS — R2689 Other abnormalities of gait and mobility: Secondary | ICD-10-CM

## 2020-01-15 DIAGNOSIS — M6281 Muscle weakness (generalized): Secondary | ICD-10-CM

## 2020-01-15 DIAGNOSIS — M21371 Foot drop, right foot: Secondary | ICD-10-CM

## 2020-01-15 NOTE — Therapy (Signed)
[facility] [address] [place], [place], [address] Phone: [phone]   Fax:  [phone]  Physical Therapy Treatment  Patient Details  Name: [name] MRN: [id number] Date of Birth: 03/05/1971 Referring Provider (PT): [name], [name], [name], MD   Encounter Date: 01/15/2020   PT End of Session - 01/15/20 0800    Visit Number 6    Number of Visits 17    Date for PT Re-Evaluation 03/24/20   POC for 8 weeks, Cert for 90 days   Authorization Type [employer]    PT Start Time 0800    PT Stop Time [phone]    PT Time Calculation (min) 43 min    Equipment Utilized During Treatment Gait belt    Activity Tolerance Patient tolerated treatment well;No increased pain    Behavior During Therapy WFL for tasks assessed/performed           Past Medical History:  Diagnosis Date  . Arthritis    neck  . Carpal tunnel syndrome on both sides 08/2014  . DDD (degenerative disc disease), lumbar   . History of kidney stones   . Hypertension    under control with med., has been on med. x 1 yr.  . Sleep apnea    CPAP but does not use  . Stroke [facility]) 11/16/2019    Past Surgical History:  Procedure Laterality Date  . BUBBLE STUDY  11/21/2019   Procedure: BUBBLE STUDY;  Surgeon: [name], MD;  Location: [facility] ENDOSCOPY;  Service: Cardiovascular;;  . CARPAL TUNNEL RELEASE Left 09/07/2014   Procedure: LEFT CARPAL TUNNEL RELEASE;  Surgeon: [name], MD;  Location: [facility];  Service: Orthopedics;  Laterality: Left;  . CARPAL TUNNEL RELEASE Right 10/12/2014   Procedure: RIGHT CARPAL TUNNEL RELEASE;  Surgeon: [name], MD;  Location: [facility];  Service: Orthopedics;  Laterality: Right;  . KNEE ARTHROSCOPY Left   . SUBOCCIPITAL CRANIECTOMY CERVICAL LAMINECTOMY N/A 11/17/2019   Procedure: SUBOCCIPITAL DECOMPRESSIVE CRANIECTOMY;   Surgeon: [name], MD;  Location: [facility] OR;  Service: Neurosurgery;  Laterality: N/A;  . TEE WITHOUT CARDIOVERSION N/A 11/21/2019   Procedure: TRANSESOPHAGEAL ECHOCARDIOGRAM (TEE);  Surgeon: [name], MD;  Location: [facility] ENDOSCOPY;  Service: Cardiovascular;  Laterality: N/A;    There were no vitals filed for this visit.   Subjective Assessment - 01/15/20 0802    Subjective Patient reports no new changes/complaints. No falls to report. Reports HEP addition is going well.    Patient is accompained by: Family member   Wife [name]   Pertinent History hypertension, hyperlipidemia, depression/anxiety, degenerative disc disease in lumbar region, obesity, sleep apnea    Limitations Walking;Standing    Patient Stated Goals Back to Normal, Do the things I did prior    Currently in Pain? Yes    Pain Score 5     Pain Location Generalized    Pain Orientation Other (Comment)   generalized; all over   Pain Descriptors / Indicators Sore    Pain Type Acute pain                             [facility] Adult PT Treatment/Exercise - 01/15/20 0001      Transfers   Transfers Sit to Stand;Stand to Sit    Sit to Stand 5: Supervision    Stand to Sit 5: Supervision  Ambulation/Gait   Ambulation/Gait Yes    Ambulation/Gait Assistance 4: Min guard    Ambulation/Gait Assistance Details completed gait training without AD today, with PT providing verbal cues for step length, as patient demo 1-2 instances of over striding demo increased balance challenge.     Ambulation Distance (Feet) 345 Feet    Assistive device None    Gait Pattern Step-through pattern;Decreased step length - left;Decreased stance time - right;Decreased dorsiflexion - right;Right steppage;Wide base of support;Poor foot clearance - right    Ambulation Surface Level;Indoor      Neuro Re-ed    Neuro Re-ed Details  In // bars: completed alternating forward step overs (over black beam) with intermittent UE  support as needed 1 x 15 reps, increased difficulty with stance on RLE. Patient often requiring UE support and CGA to maintain balance. Patient reports more difficulty with stepping backward and regain balance. Progressed to completing backwards steps without balance beam x 15 reps bilaterally, increased dififculty noted iwth posterior stepping and weight shift.            Vestibular Treatment/Exercise - 01/15/20 0001      Vestibular Treatment/Exercise   Vestibular Treatment Provided Gaze    Gaze Exercises X1 Viewing Horizontal;X1 Viewing Vertical      X1 Viewing Horizontal   Foot Position standing feet shoulder width apart    Reps 2    Comments x 45 seconds on plain background, mild sway noted.      X1 Viewing Vertical   Foot Position standing feet shoulder with apart    Reps 4    Comments x 45 seconds. increased sway noted with vertical in standing position. CGA at times due to balance challenge.               Balance Exercises - 01/15/20 0001      Balance Exercises: Standing   Standing Eyes Opened Wide (BOA);Head turns;Foam/compliant surface;Limitations    Standing Eyes Opened Limitations completed initially wide BOS with eyes open 2 x 1 minute each, at time patient require sudden UE support as patient reports feels like body gets out of control. Progresed to completing horizontal/vertical head turns 2 x 10 reps with wide BOS on airex. Increased difficulty with anterior/posterior noted. CGA throughout.                PT Short Term Goals - 01/01/20 1254      PT SHORT TERM GOAL #1   Title patient will be independent with initial HEP for strengthening/balance (All STG's Due: 01/22/20)    Baseline no HEP established    Time 4    Period Weeks    Status New    Target Date 01/22/20      PT SHORT TERM GOAL #2   Title Patient will demo ability to ambulate >500 ft on indoor level surfaces with LRAD and supervision to demo improved household mobility    Baseline 100, CGA  with Rollator    Time 4    Period Weeks    Status New      PT SHORT TERM GOAL #3   Title Patient will improve TUG score w/ LRAD to </= 25 seconds to demo improved functional mobility    Baseline 35.84 secs    Time 4    Period Weeks    Status New      PT SHORT TERM GOAL #4   Title Patient will improve 5x sit <> stand to </= 30 seconds to demonstrate improved functional strength  Baseline 49.56 secs w/ UE support    Time 4    Period Weeks    Status New      PT SHORT TERM GOAL #5   Title Berg Balance to be assessed and LTG to be set as appropriate    Baseline baseline established LTG set.    Time 4    Period Weeks    Status Achieved             PT Long Term Goals - 01/01/20 1255      PT LONG TERM GOAL #1   Title Patient will be independent with final HEP for strengthening/balance (All LTG's due: 02/19/20)    Baseline HEP established    Time 8    Period Weeks    Status New      PT LONG TERM GOAL #2   Title Patient will demonstrate ability to ambulate >1000 on indoor/outdoor surfaces with LRAD and supervision for improved community mobility    Baseline 100 ft CGA    Time 8    Period Weeks    Status New      PT LONG TERM GOAL #3   Title Patient will improve gait speed to >/= 2.0 ft/sec  with LRAD to demonstrate improved community ambulation    Baseline 0.69 ft/sec    Time 8    Period Weeks    Status New      PT LONG TERM GOAL #4   Title Patient will improve 5x sit <> stand to </= 20 secs to demonstrate improved functional mobility    Baseline 49.56 secs    Time 8    Period Weeks    Status New      PT LONG TERM GOAL #5   Title Patient will improve Tug to </= 15 seconds with LRAD to demo improved mobility and reduced fall risk    Baseline 35.84 secs    Time 8    Period Weeks    Status New      PT LONG TERM GOAL #6   Title Patient will improve Berg Balance to >/= 45/56 to demonstrate reduced fall risk and improved balance    Baseline 39/56    Time 8     Period Weeks    Status New                 Plan - 01/15/20 1207    Clinical Impression Statement Progressed VOR x 1 to standing today, with increased difficulty noted due to added balance challenge. Continues balance activites in standing working on anterior/posterior stepping and stepping strategy as tolerated by patient. Will continue to prgoress toward all goals.    Personal Factors and Comorbidities Comorbidity 3+    Comorbidities hypertension, hyperlipidemia, depression/anxiety, degenerative disc disease in lumbar region, obesity, sleep apnea    Examination-Activity Limitations Bend;Transfers;Stairs;Stand;Lift    Examination-Participation Restrictions Occupation;Yard Work;Community Activity;Driving    Stability/Clinical Decision Making Evolving/Moderate complexity    Rehab Potential Good    PT Frequency 2x / week    PT Duration 8 weeks    PT Treatment/Interventions ADLs/Self Care Home Management;Aquatic Therapy;Cryotherapy;Electrical Stimulation;Moist Heat;DME Instruction;Gait training;Stair training;Functional mobility training;Therapeutic activities;Therapeutic exercise;Balance training;Neuromuscular re-education;Patient/family education;Orthotic Fit/Training;Manual techniques;Passive range of motion;Vestibular    PT Next Visit Plan Continue gait without AD. Functional [place] (standing), Balance Exercises. Slowly incorporating vision removed, retropulsion noted with EC. Recieved AFO?    Consulted and Agree with Plan of Care Patient;Family member/caregiver    Family Member Consulted Wife  Patient will benefit from skilled therapeutic intervention in order to improve the following deficits and impairments:  Abnormal gait, Decreased balance, Decreased endurance, Difficulty walking, Impaired sensation, Dizziness, Decreased range of motion, Decreased activity tolerance, Decreased knowledge of use of DME, Decreased strength, Pain  Visit Diagnosis: Other abnormalities  of gait and mobility  Muscle weakness (generalized)  Foot drop, right  Unsteadiness on feet  Hemiplegia and hemiparesis following cerebral infarction affecting right dominant side [facility])     Problem List Patient Active Problem List   Diagnosis Date Noted  . Transaminitis   . PFO (patent foramen ovale) 11/22/2019  . Headache 11/22/2019  . Cerebral edema (HCC) 11/22/2019  . Chest pain 11/22/2019  . Hyperlipidemia LDL goal <70 11/22/2019  . OSA (obstructive sleep apnea) 11/22/2019  . 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 Vascular headache   . Dyslipidemia   . Hypertension   . Intractable nausea and vomiting   . Hypokalemia   . Depression with anxiety   . Ischemic stroke (HCC) - L PICA infarct embolic in setting of PFO s/p crani   . Lumbar spondylolysis 07/31/2019  . Lumbar radiculopathy 04/07/2019  . Right foot drop 04/07/2019  . Backache 07/05/2017  . DDD (degenerative disc disease), lumbar 07/05/2017  . Spinal stenosis of lumbar region 07/05/2017    Tempie Donning, PT, DPT 01/15/2020, 12:09 PM  [facility] [address] [place], [place], [address] Phone: [phone]   Fax:  [phone]  Name: [name] MRN: [id number] Date of Birth: 1970/07/04

## 2020-01-16 ENCOUNTER — Encounter (HOSPITAL_BASED_OUTPATIENT_CLINIC_OR_DEPARTMENT_OTHER): Payer: 59 | Admitting: Physical Medicine & Rehabilitation

## 2020-01-16 ENCOUNTER — Encounter: Payer: Self-pay | Admitting: Physical Medicine & Rehabilitation

## 2020-01-16 VITALS — BP 125/86 | HR 86 | Temp 98.3°F | Ht 75.0 in | Wt 301.4 lb

## 2020-01-16 DIAGNOSIS — I69319 Unspecified symptoms and signs involving cognitive functions following cerebral infarction: Secondary | ICD-10-CM | POA: Diagnosis not present

## 2020-01-16 DIAGNOSIS — I639 Cerebral infarction, unspecified: Secondary | ICD-10-CM

## 2020-01-16 DIAGNOSIS — F4323 Adjustment disorder with mixed anxiety and depressed mood: Secondary | ICD-10-CM | POA: Diagnosis not present

## 2020-01-16 NOTE — Patient Instructions (Addendum)
Topirimate is for headaches  Voltaren gel to neck 4 times a day  Desensitize neck incision but gently rubbing

## 2020-01-16 NOTE — Progress Notes (Signed)
Subjective:    Patient ID: [name], male    DOB: 09-Jun-1970, 49 y.o.   MRN: [id number] 49 y.o. right-handed male with history of hypertension, sleep apnea with intermittent CPAP, hyperlipidemia, depression with anxiety, obesity with BMI 39.68.  Patient lives with spouse.  Works as a Psychologist, occupational.  1 level home 3 steps to entry.  Presented 11/16/2019 with dizziness and gait abnormality.  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  Additional punctate acute infarct question within the left occipital lobe.  CT angiogram of head and neck negative for large vessel occlusion or stenosis.  Echocardiogram with ejection fraction of 65% normal wall abnormalities.  TCD Doppler with bubble positive for PFO at rest.  TEE highly mobile intra-atrial septum.  But no clear PFO or ASD flow seen despite multiple angle windows.  Admission chemistries potassium 3.3 glucose 189 troponin negative alcohol negative lactic acid 5.2.  Hospital course further complicated by increasing headaches and lethargy.  Follow-up CT imaging showed brainstem compression and obstructive hydrocephalus with patient undergoing suboccipital craniotomy 11/17/2019 per Dr. Johnsie Cancel.  Neurology follow-up patient was cleared to begin aspirin and Plavix for CVA prophylaxis x3 weeks then aspirin alone.  Patient would follow-up Dr. Excell Seltzer to consider PFO closure as an outpatient.  Patient was cleared to begin subcutaneous Lovenox for DVT prophylaxis.  Initially maintained on Depakote for persistent headaches.  Close monitoring of blood pressure initially on Cleviprex  Admit date: 11/22/2019 Discharge date: 12/06/2019  HPI 49 year old male who is seen in follow-up from his inpatient rehabilitation stay following large left PICA infarct which required suboccipital craniotomy for brainstem compression and obstructive hydrocephalus. The patient has been followed up by neurosurgery, Dr. Johnsie Cancel and was  told that postoperative check look good. I do not have those records. Also has seen neurology, Dr. Pearlean Brownie. Recommended continued aspirin and PFO closure. Patient saw Dr. Excell Seltzer from cardiology who is recommending PFO closure. The patient has not returned to work or driving. Patient previously worked in Production designer, theatre/television/film for [employer] which was a very physical job requiring climbing carrying objects, going anywhere from the basement to the roof according to the patient Outpt PT, OT (OT only 3 visits thus far) the patient is able to dress himself and bathe himself. He continues to use a walker to ambulate Complaints include neck pain mainly with range of motion. No pain running down the arms. Hypersensitive area over the surgical scar in the back of the neck Continued dizziness but no nausea or vomiting. Dizziness is mainly triggered by motion but sometimes can be triggered by imagining his surgery or other stressful situations. Patient complains that he "does not feel like a man" anymore, he also feels tired with minimal activity, his wife also notes problems with cognition Pain Inventory Average Pain 7 Pain Right Now 6 My pain is intermittent, constant, sharp, stabbing and aching  LOCATION OF PAIN  Back of neck pain, body pain & headache. BOWEL Number of stools per week: 4 Oral laxative use No  Type of laxative None Enema or suppository use No  History of colostomy No  Incontinent No   BLADDER Normal In and out cath, frequency No Able to self cath No  Bladder incontinence No  Frequent urination No  Leakage with coughing No  Difficulty starting stream No  Incomplete bladder emptying No    Mobility walk with assistance use a walker how many minutes can you walk? 10 mins ability to climb steps?  yes do you drive?  no  Function disabled: date disabled November 16, 2019 I need assistance with the following:  bathing and shopping Do you have any goals in this area?  yes  Neuro/Psych trouble  walking dizziness confusion depression anxiety  Prior Studies [facility]- [facility]  Physicians involved in your care [facility]   Family History  Problem Relation Age of Onset  . Heart attack Mother    Social History   Socioeconomic History  . Marital status: Married    Spouse name: [name]  . Number of children: Not on file  . Years of education: Not on file  . Highest education level: Not on file  Occupational History  . Not on file  Tobacco Use  . Smoking status: Never Smoker  . Smokeless tobacco: Never Used  Vaping Use  . Vaping Use: Former  Substance and Sexual Activity  . Alcohol use: Not Currently    Comment: quit 2006  . Drug use: No  . Sexual activity: Yes  Other Topics Concern  . Not on file  Social History Narrative   Right handed    Lives with wife    Social Determinants of Health   Financial Resource Strain:   . Difficulty of Paying Living Expenses: Not on file  Food Insecurity:   . Worried About Programme researcher, broadcasting/film/videounning Out of Food in the Last Year: Not on file  . Ran Out of Food in the Last Year: Not on file  Transportation Needs:   . Lack of Transportation (Medical): Not on file  . Lack of Transportation (Non-Medical): Not on file  Physical Activity:   . Days of Exercise per Week: Not on file  . Minutes of Exercise per Session: Not on file  Stress:   . Feeling of Stress : Not on file  Social Connections:   . Frequency of Communication with Friends and Family: Not on file  . Frequency of Social Gatherings with Friends and Family: Not on file  . Attends Religious Services: Not on file  . Active Member of Clubs or Organizations: Not on file  . Attends BankerClub or Organization Meetings: Not on file  . Marital Status: Not on file   Past Surgical History:  Procedure Laterality Date  . BUBBLE STUDY  11/21/2019   Procedure: BUBBLE STUDY;  Surgeon: Jodelle Redhristopher, Bridgette, MD;  [facility];  Service: Cardiovascular;;  .  CARPAL TUNNEL RELEASE Left 09/07/2014   Procedure: LEFT CARPAL TUNNEL RELEASE;  Surgeon: Dominica SeverinWilliam Gramig, MD;  Location: [facility];  Service: Orthopedics;  Laterality: Left;  . CARPAL TUNNEL RELEASE Right 10/12/2014   Procedure: RIGHT CARPAL TUNNEL RELEASE;  Surgeon: Dominica SeverinWilliam Gramig, MD;  Location: [facility];  Service: Orthopedics;  Laterality: Right;  . KNEE ARTHROSCOPY Left   . SUBOCCIPITAL CRANIECTOMY CERVICAL LAMINECTOMY N/A 11/17/2019   Procedure: SUBOCCIPITAL DECOMPRESSIVE CRANIECTOMY;  Surgeon: Jadene Pierinistergard, Thomas A, MD;  [facility];  Service: Neurosurgery;  Laterality: N/A;  . TEE WITHOUT CARDIOVERSION N/A 11/21/2019   Procedure: TRANSESOPHAGEAL ECHOCARDIOGRAM (TEE);  Surgeon: Jodelle Redhristopher, Bridgette, MD;  [facility];  Service: Cardiovascular;  Laterality: N/A;   Past Medical History:  Diagnosis Date  . Arthritis    neck  . Carpal tunnel syndrome on both sides 08/2014  . DDD (degenerative disc disease), lumbar   . History of kidney stones   . Hypertension    under control with med., has been on med. x 1 yr.  . Sleep apnea  CPAP but does not use  . Stroke (HCC) 11/16/2019   BP 125/86   Pulse 86   Temp 98.3 F (36.8 C)   Ht 6\' 3"  (1.905 m)   Wt (!) 301 lb 6.4 oz (136.7 kg)   SpO2 96%   BMI 37.67 kg/m   Opioid Risk Score:   Fall Risk Score:  `1  Depression screen PHQ 2/9  Depression screen PHQ 2/9 12/18/2019  Decreased Interest 3  Down, Depressed, Hopeless 3  PHQ - 2 Score 6  Altered sleeping 3  Tired, decreased energy 3  Change in appetite 2  Feeling bad or failure about yourself  3  Trouble concentrating 2  Moving slowly or fidgety/restless 3  Suicidal thoughts 1  PHQ-9 Score 23   Review of Systems  Constitutional: Negative.   HENT: Negative.   Eyes: Negative.   Respiratory: Negative.   Cardiovascular: Negative.   Gastrointestinal: Negative.   Endocrine: Negative.   Genitourinary: Negative.   Musculoskeletal:  Positive for arthralgias, gait problem and neck pain.       Balance  Allergic/Immunologic: Negative.   Neurological: Positive for dizziness and headaches.  Hematological: Negative.   Psychiatric/Behavioral: Positive for confusion.       Depression, Anxiety       Objective:   Physical Exam Vitals and nursing note reviewed.  Constitutional:      Appearance: He is obese.  HENT:     Head: Normocephalic and atraumatic.  Eyes:     Extraocular Movements: Extraocular movements intact.     Conjunctiva/sclera: Conjunctivae normal.     Pupils: Pupils are equal, round, and reactive to light.  Cardiovascular:     Rate and Rhythm: Normal rate and regular rhythm.     Heart sounds: Normal heart sounds. No murmur heard.   Pulmonary:     Effort: Pulmonary effort is normal. No respiratory distress.     Breath sounds: Normal breath sounds. No stridor.  Abdominal:     General: Abdomen is flat. Bowel sounds are normal. There is no distension.     Palpations: Abdomen is soft.     Tenderness: There is no abdominal tenderness.  Musculoskeletal:        General: Tenderness present.     Cervical back: Tenderness present.     Right lower leg: No edema.     Left lower leg: No edema.  Skin:    General: Skin is warm and dry.     Findings: No erythema.  Neurological:     Mental Status: He is alert and oriented to person, place, and time.     Cranial Nerves: No dysarthria or facial asymmetry.     Sensory: Sensation is intact.     Motor: No tremor, abnormal muscle tone or seizure activity.     Coordination: Impaired rapid alternating movements.     Gait: Gait abnormal and tandem walk abnormal.     Comments: 5/5 in bilateral deltoid bicep tricep grip hip flexor knee extensor 4 - right ankle dorsiflexor 5/5 left ankle dorsiflexor Slowed finger to thumb opposition  Unable to stand with feet together Ambulates with a walker wide-based support no evidence of toe drag or knee instability  Psychiatric:          Attention and Perception: Attention normal.        Mood and Affect: Affect is blunt and flat.        Speech: Speech is delayed.        Behavior: Behavior is slowed.  Cognition and Memory: Memory is impaired.     Comments: Unable to remember his hospitalization until out of the ICU    Hypersensitivity to touch over the craniotomy incision in the occipital area       Assessment & Plan:  #1. 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 The patient has continued problems with truncal ataxia but also general cognitive slowing, anxiety and depression. Continue outpatient PT OT. Referral to neuropsychology Follow-up with neurology Follow-up with cardiology for PFO closure Follow-up with neurosurgery Numeral 2. Postoperative neck pain hypersensitivity to touch over this surgical scar have instructed patient and wife on desensitization also recommend Voltaren gel 4 times daily to neck

## 2020-01-17 ENCOUNTER — Other Ambulatory Visit: Payer: Self-pay

## 2020-01-17 ENCOUNTER — Ambulatory Visit: Payer: 59 | Attending: Physician Assistant

## 2020-01-17 ENCOUNTER — Ambulatory Visit: Payer: 59 | Admitting: Occupational Therapy

## 2020-01-17 DIAGNOSIS — R2689 Other abnormalities of gait and mobility: Secondary | ICD-10-CM | POA: Insufficient documentation

## 2020-01-17 DIAGNOSIS — M21371 Foot drop, right foot: Secondary | ICD-10-CM | POA: Diagnosis present

## 2020-01-17 DIAGNOSIS — M6281 Muscle weakness (generalized): Secondary | ICD-10-CM | POA: Diagnosis present

## 2020-01-17 DIAGNOSIS — R41842 Visuospatial deficit: Secondary | ICD-10-CM

## 2020-01-17 DIAGNOSIS — I69351 Hemiplegia and hemiparesis following cerebral infarction affecting right dominant side: Secondary | ICD-10-CM | POA: Diagnosis present

## 2020-01-17 DIAGNOSIS — I69318 Other symptoms and signs involving cognitive functions following cerebral infarction: Secondary | ICD-10-CM | POA: Diagnosis present

## 2020-01-17 DIAGNOSIS — R2681 Unsteadiness on feet: Secondary | ICD-10-CM

## 2020-01-17 NOTE — Therapy (Signed)
[facility] [address] [place], [place], [address] Phone: [phone]   Fax:  [phone]  Occupational Therapy Treatment  Patient Details  Name: [name] MRN: [id number] Date of Birth: 1970-08-05 No data recorded  Encounter Date: 01/17/2020   OT End of Session - 01/17/20 0910    Visit Number 3    Number of Visits 9    Authorization Type [employer] - 60 visit combined PT/OT/ST limit (hard max)    OT Start Time 0845    OT Stop Time 0930    OT Time Calculation (min) 45 min    Activity Tolerance Patient tolerated treatment well    Behavior During Therapy [facility] for tasks assessed/performed           Past Medical History:  Diagnosis Date  . Arthritis    neck  . Carpal tunnel syndrome on both sides 08/2014  . DDD (degenerative disc disease), lumbar   . History of kidney stones   . Hypertension    under control with med., has been on med. x 1 yr.  . Sleep apnea    CPAP but does not use  . Stroke [facility] 11/16/2019    Past Surgical History:  Procedure Laterality Date  . BUBBLE STUDY  11/21/2019   Procedure: BUBBLE STUDY;  Surgeon: [name], MD;  Location: [facility] ENDOSCOPY;  Service: Cardiovascular;;  . CARPAL TUNNEL RELEASE Left 09/07/2014   Procedure: LEFT CARPAL TUNNEL RELEASE;  Surgeon: [name], MD;  Location: [facility];  Service: Orthopedics;  Laterality: Left;  . CARPAL TUNNEL RELEASE Right 10/12/2014   Procedure: RIGHT CARPAL TUNNEL RELEASE;  Surgeon: [name], MD;  Location: [facility];  Service: Orthopedics;  Laterality: Right;  . KNEE ARTHROSCOPY Left   . SUBOCCIPITAL CRANIECTOMY CERVICAL LAMINECTOMY N/A 11/17/2019   Procedure: SUBOCCIPITAL DECOMPRESSIVE CRANIECTOMY;  Surgeon: [name], MD;  Location: [facility] OR;  Service: Neurosurgery;  Laterality: N/A;  . TEE WITHOUT CARDIOVERSION N/A 11/21/2019   Procedure: TRANSESOPHAGEAL ECHOCARDIOGRAM (TEE);  Surgeon:  [name], MD;  Location: [facility] ENDOSCOPY;  Service: Cardiovascular;  Laterality: N/A;    There were no vitals filed for this visit.   Subjective Assessment - 01/17/20 0849    Subjective  I'm not sleeping well through the night since I came off so many medicines    Pertinent History LT PICA infarct. PMH: HTN, HLD    Limitations No driving    Currently in Pain? Yes    Pain Score 6     Pain Location Generalized    Pain Descriptors / Indicators Sore;Tender    Pain Type Acute pain    Pain Onset More than a month ago    Aggravating Factors  worse in the AM    Pain Relieving Factors hot shower          Complex word search w/ extra time but doing I'ly.  Environmental scanning finding 12/13 items on first pass (92% accuracy) and remaining item on second pass - moving slowly due to balance.  Pt demo safety with dynamic standing tasks using one hand countertop support when appropriate - pt getting items out of low cabinets, dishwasher, refrigerator, placing clothes in washer and dryer and retrieving from dryer all with good safety and no LOB. Pt given options for sweeping to prevent falls. Pt already sitting to get pants on up to thigh.  Discussed plan for next session  OT Long Term Goals - 01/17/20 0912      OT LONG TERM GOAL #1   Title Pt will perform environmental scanning w/ DME prn at 90% accuracy with dizziness less than or equal to 3/10    Time 8    Period Weeks    Status On-going   01/17/20: pt found 12/13 items on first pass     OT LONG TERM GOAL #2   Title Pt will read page of text (larger print prn) with only min difficulty    Time 8    Period Weeks    Status On-going      OT LONG TERM GOAL #3   Title Pt will perform dynamic standing activities (reaching low/high) w/ one hand countertop support and no LOB    Time 8    Period Weeks    Status On-going      OT LONG TERM GOAL #4   Title Pt will verbalize  understanding with memory strategies and other compensations prn    Time 8    Period Weeks    Status Achieved      OT LONG TERM GOAL #5   Title Pt/family will verbalize understanding of driving eval info prn    Time 8    Period Weeks    Status New                 Plan - 01/17/20 1051    Clinical Impression Statement Pt progressing towards all remaining goals. Pt demo good safety awareness w/ dynmaic standing balance    OT Occupational Profile and History Problem Focused Assessment - Including review of records relating to presenting problem    Occupational performance deficits (Please refer to evaluation for details): IADL's;Work;Leisure    Body Structure / Function / Physical Skills IADL;Vestibular;Coordination;Vision    Cognitive Skills Memory;Attention;Safety Awareness    Rehab Potential Excellent    Clinical Decision Making Limited treatment options, no task modification necessary    Comorbidities Affecting Occupational Performance: None    Modification or Assistance to Complete Evaluation  No modification of tasks or assist necessary to complete eval    OT Frequency 2x / week   however most likely will only see 1x/wk due to scheduling conflicts and family preference   OT Duration 8 weeks    OT Treatment/Interventions Self-care/ADL training;Functional Mobility Training;Neuromuscular education;Therapeutic activities;Coping strategies training;Visual/perceptual remediation/compensation;Patient/family education;DME and/or AE instruction;Cognitive remediation/compensation    Plan dynamic balance to reach towards floor, seated - work on quicker head turns (w/ cards) in prep for driving, high level attention tasks in distracting environment    Consulted and Agree with Plan of Care Patient           Patient will benefit from skilled therapeutic intervention in order to improve the following deficits and impairments:   Body Structure / Function / Physical Skills: IADL,  Vestibular, Coordination, Vision Cognitive Skills: Memory, Attention, Safety Awareness     Visit Diagnosis: Unsteadiness on feet  Visuospatial deficit    Problem List Patient Active Problem List   Diagnosis Date Noted  . Transaminitis   . PFO (patent foramen ovale) 11/22/2019  . Headache 11/22/2019  . Cerebral edema (HCC) 11/22/2019  . Chest pain 11/22/2019  . Hyperlipidemia LDL goal <70 11/22/2019  . OSA (obstructive sleep apnea) 11/22/2019  . 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 Vascular headache   . Dyslipidemia   . Hypertension   . Intractable nausea and vomiting   .  Hypokalemia   . Depression with anxiety   . Ischemic stroke (HCC) - L PICA infarct embolic in setting of PFO s/p crani   . Lumbar spondylolysis 07/31/2019  . Lumbar radiculopathy 04/07/2019  . Right foot drop 04/07/2019  . Backache 07/05/2017  . DDD (degenerative disc disease), lumbar 07/05/2017  . Spinal stenosis of lumbar region 07/05/2017    Kelli Churn, OTR/L 01/17/2020, 10:53 AM  [facility] [address] [place], [place], [address] Phone: [phone]   Fax:  [phone]  Name: ANTARIO YASUDA MRN: [id number] Date of Birth: [birth year]

## 2020-01-17 NOTE — Therapy (Signed)
[facility] [address] [place], [place], [address] Phone: [phone]   Fax:  [phone]  Physical Therapy Treatment  Patient Details  Name: [name] MRN: [id number] Date of Birth: 08/22/1970 Referring Provider (PT): [name], [name], [name], MD   Encounter Date: 01/17/2020   PT End of Session - 01/17/20 0804    Visit Number 7    Number of Visits 17    Date for PT Re-Evaluation 03/24/20   POC for 8 weeks, Cert for 90 days   Authorization Type [employer]    PT Start Time [phone]    PT Stop Time 0845    PT Time Calculation (min) 41 min    Equipment Utilized During Treatment Gait belt    Activity Tolerance Patient tolerated treatment well;No increased pain    Behavior During Therapy WFL for tasks assessed/performed           Past Medical History:  Diagnosis Date  . Arthritis    neck  . Carpal tunnel syndrome on both sides 08/2014  . DDD (degenerative disc disease), lumbar   . History of kidney stones   . Hypertension    under control with med., has been on med. x 1 yr.  . Sleep apnea    CPAP but does not use  . Stroke [facility]) 11/16/2019    Past Surgical History:  Procedure Laterality Date  . BUBBLE STUDY  11/21/2019   Procedure: BUBBLE STUDY;  Surgeon: [name], MD;  Location: [facility] ENDOSCOPY;  Service: Cardiovascular;;  . CARPAL TUNNEL RELEASE Left 09/07/2014   Procedure: LEFT CARPAL TUNNEL RELEASE;  Surgeon: [name], MD;  Location: [facility];  Service: Orthopedics;  Laterality: Left;  . CARPAL TUNNEL RELEASE Right 10/12/2014   Procedure: RIGHT CARPAL TUNNEL RELEASE;  Surgeon: [name], MD;  Location: [facility];  Service: Orthopedics;  Laterality: Right;  . KNEE ARTHROSCOPY Left   . SUBOCCIPITAL CRANIECTOMY CERVICAL LAMINECTOMY N/A 11/17/2019   Procedure: SUBOCCIPITAL DECOMPRESSIVE CRANIECTOMY;   Surgeon: [name], MD;  Location: [facility] OR;  Service: Neurosurgery;  Laterality: N/A;  . TEE WITHOUT CARDIOVERSION N/A 11/21/2019   Procedure: TRANSESOPHAGEAL ECHOCARDIOGRAM (TEE);  Surgeon: [name], MD;  Location: [facility] ENDOSCOPY;  Service: Cardiovascular;  Laterality: N/A;    There were no vitals filed for this visit.   Subjective Assessment - 01/17/20 0807    Subjective Patient reports no new changes/complaints since last visit. Had visit with Dr. [name] yesterday went well. Walking with [employer] vs. Rollator due to leaving in car.    Patient is accompained by: Family member   Wife [name]   Pertinent History hypertension, hyperlipidemia, depression/anxiety, degenerative disc disease in lumbar region, obesity, sleep apnea    Limitations Walking;Standing    Patient Stated Goals Back to Normal, Do the things I did prior    Currently in Pain? Yes    Pain Score 6     Pain Location Generalized    Pain Orientation --   All over   Pain Descriptors / Indicators Sore    Pain Type Acute pain    Pain Onset More than a month ago    Pain Frequency Intermittent    Aggravating Factors  Worse in AM    Pain Relieving Factors Hot Shower  [facility] Adult PT Treatment/Exercise - 01/17/20 0001      Ambulation/Gait   Ambulation/Gait Yes    Ambulation/Gait Assistance 4: Min guard    Ambulation/Gait Assistance Details completed gait training without AD. patient demo improved heel toe pattern with concentration. one instances of requriring to stop ambulation due to patient scanning enironment and feels as it challenged balance. x 115 with small base quad cane, with PT providing verbal cues on proper sequencing with patient demo improved completion.     Ambulation Distance (Feet) 460 Feet    Assistive device None;Small based quad cane    Gait Pattern Step-through pattern;Decreased step length - left;Decreased stance time - right;Decreased  dorsiflexion - right;Right steppage;Wide base of support;Poor foot clearance - right    Ambulation Surface Level;Indoor      Neuro Re-ed    Neuro Re-ed Details  Standing in // bars without UE support: completed alternating toe taps x 15 reps with BLE. Patient demo increased speed with toe tap on RLE to help reduce stance time on LLE. Patient able to complete with intermittent UE support to maintain balance. Standing with feet apart (shoulder width) completed horizontal head turns to R/L x 10 reps, progressed to narrow BOS (2-3 inches between feet) x 10 reps of horizontal head turns. Increased balance challenge and difficulty noted with narrow BOS.            Vestibular Treatment/Exercise - 01/17/20 0001      Vestibular Treatment/Exercise   Vestibular Treatment Provided Gaze      X1 Viewing Horizontal   Foot Position standing feet shoulder width apart    Reps 2    Comments x 45 seconds on plain background, mild sway noted.      X1 Viewing Vertical   Foot Position standing feet shoulder with apart    Reps 2    Comments x 45 seconds. increased sway noted with vertical in standing position. CGA at times due to balance challenge. Patient had one instance of nausea with completion. Able to sit and symptoms resolved quickly.               Balance Exercises - 01/17/20 0001      Balance Exercises: Standing   Balance Beam standin across red balance beam focused on holding steady with eyes open 5 x 30-45 seconds. increased cahllenge to balance with difficulty with trunk control noted, patient often demo imbalance posteriorly. require intermittent UE support with completion.                PT Short Term Goals - 01/01/20 1254      PT SHORT TERM GOAL #1   Title patient will be independent with initial HEP for strengthening/balance (All STG's Due: 01/22/20)    Baseline no HEP established    Time 4    Period Weeks    Status New    Target Date 01/22/20      PT SHORT TERM GOAL #2    Title Patient will demo ability to ambulate >500 ft on indoor level surfaces with LRAD and supervision to demo improved household mobility    Baseline 100, CGA with Rollator    Time 4    Period Weeks    Status New      PT SHORT TERM GOAL #3   Title Patient will improve TUG score w/ LRAD to </= 25 seconds to demo improved functional mobility    Baseline 35.84 secs    Time 4    Period Weeks  Status New      PT SHORT TERM GOAL #4   Title Patient will improve 5x sit <> stand to </= 30 seconds to demonstrate improved functional strength    Baseline 49.56 secs w/ UE support    Time 4    Period Weeks    Status New      PT SHORT TERM GOAL #5   Title Berg Balance to be assessed and LTG to be set as appropriate    Baseline baseline established LTG set.    Time 4    Period Weeks    Status Achieved             PT Long Term Goals - 01/01/20 1255      PT LONG TERM GOAL #1   Title Patient will be independent with final HEP for strengthening/balance (All LTG's due: 02/19/20)    Baseline HEP established    Time 8    Period Weeks    Status New      PT LONG TERM GOAL #2   Title Patient will demonstrate ability to ambulate >1000 on indoor/outdoor surfaces with LRAD and supervision for improved community mobility    Baseline 100 ft CGA    Time 8    Period Weeks    Status New      PT LONG TERM GOAL #3   Title Patient will improve gait speed to >/= 2.0 ft/sec  with LRAD to demonstrate improved community ambulation    Baseline 0.69 ft/sec    Time 8    Period Weeks    Status New      PT LONG TERM GOAL #4   Title Patient will improve 5x sit <> stand to </= 20 secs to demonstrate improved functional mobility    Baseline 49.56 secs    Time 8    Period Weeks    Status New      PT LONG TERM GOAL #5   Title Patient will improve Tug to </= 15 seconds with LRAD to demo improved mobility and reduced fall risk    Baseline 35.84 secs    Time 8    Period Weeks    Status New      PT  LONG TERM GOAL #6   Title Patient will improve Berg Balance to >/= 45/56 to demonstrate reduced fall risk and improved balance    Baseline 39/56    Time 8    Period Weeks    Status New                 Plan - 01/17/20 1120    Clinical Impression Statement PT educating on proper use of small base quad cane as patient ambulated into session with this AD today. Continued VOR x 1 in standing with patient able to complete with less sway noted today. Continued balance actvities as tolerated by patient. Will continue to progress.    Personal Factors and Comorbidities Comorbidity 3+    Comorbidities hypertension, hyperlipidemia, depression/anxiety, degenerative disc disease in lumbar region, obesity, sleep apnea    Examination-Activity Limitations Bend;Transfers;Stairs;Stand;Lift    Examination-Participation Restrictions Occupation;Yard Work;Community Activity;Driving    Stability/Clinical Decision Making Evolving/Moderate complexity    Rehab Potential Good    PT Frequency 2x / week    PT Duration 8 weeks    PT Treatment/Interventions ADLs/Self Care Home Management;Aquatic Therapy;Cryotherapy;Electrical Stimulation;Moist Heat;DME Instruction;Gait training;Stair training;Functional mobility training;Therapeutic activities;Therapeutic exercise;Balance training;Neuromuscular re-education;Patient/family education;Orthotic Fit/Training;Manual techniques;Passive range of motion;Vestibular    PT Next Visit Plan Continue  gait without AD. Functional [place] (standing), Balance Exercises. Slowly incorporating vision removed, retropulsion noted with EC. Recieved AFO?    Consulted and Agree with Plan of Care Patient;Family member/caregiver    Family Member Consulted Wife           Patient will benefit from skilled therapeutic intervention in order to improve the following deficits and impairments:  Abnormal gait, Decreased balance, Decreased endurance, Difficulty walking, Impaired sensation,  Dizziness, Decreased range of motion, Decreased activity tolerance, Decreased knowledge of use of DME, Decreased strength, Pain  Visit Diagnosis: Other abnormalities of gait and mobility  Muscle weakness (generalized)  Foot drop, right  Unsteadiness on feet  Hemiplegia and hemiparesis following cerebral infarction affecting right dominant side [facility])     Problem List Patient Active Problem List   Diagnosis Date Noted  . Transaminitis   . PFO (patent foramen ovale) 11/22/2019  . Headache 11/22/2019  . Cerebral edema (HCC) 11/22/2019  . Chest pain 11/22/2019  . Hyperlipidemia LDL goal <70 11/22/2019  . OSA (obstructive sleep apnea) 11/22/2019  . 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 Vascular headache   . Dyslipidemia   . Hypertension   . Intractable nausea and vomiting   . Hypokalemia   . Depression with anxiety   . Ischemic stroke (HCC) - L PICA infarct embolic in setting of PFO s/p crani   . Lumbar spondylolysis 07/31/2019  . Lumbar radiculopathy 04/07/2019  . Right foot drop 04/07/2019  . Backache 07/05/2017  . DDD (degenerative disc disease), lumbar 07/05/2017  . Spinal stenosis of lumbar region 07/05/2017    Tempie Donning, PT, DPT 01/17/2020, 11:22 AM  [facility] [address] [place], [place], [address] Phone: [phone]   Fax:  [phone]  Name: [name] MRN: [id number] Date of Birth: Apr 27, 1971

## 2020-01-18 ENCOUNTER — Other Ambulatory Visit: Payer: Self-pay | Admitting: Physical Medicine & Rehabilitation

## 2020-01-19 NOTE — Telephone Encounter (Signed)
Spoke with the patient's wife, who cannot figure out how to use MyChart and is anxious about procedure.  Scheduled the patient for visit with Carlean Jews 9/8 to update H&P, get EKG, labs, and obtain instructions for COVID test and PFO closure. This put her mind at ease and she was grateful for assistance.

## 2020-01-20 NOTE — Telephone Encounter (Signed)
Sounds good

## 2020-01-23 NOTE — Progress Notes (Signed)
[facility]   MULTIDISCIPLINARY HEART VALVE CLINIC                                       Cardiology Office Note    Date:  01/24/2020   ID:  [name], DOB 29-Mar-1971, MRN [id number]  PCP:  Sigmund Hazel, MD  Cardiologist:  Dr. Excell Seltzer   CC: set up PFO closure   History of Present Illness:  EMERSYN KOTARSKI is a 49 y.o. male with a history of CVA and PFO who presents to clinic to discuss percutaneous PFO closure.   The patient was in his normal state of health on 11/16/19 when he developed dizziness and gait abnormality, prompting ER evaluation. He was diagnosed with a left PICA territory infarction and a questionable punctate infarct in the left occipital lobe. CTA studies showed no large vessel atherosclerosis or stenosis. Echo was normal. TCD bubble study was positive for Spencer grade 2-3 shunt at rest. TEE showed a positive bubble study but no clear color flow across the interatrial septum. The patient ultimately developed obstructive hydrocephalus and underwent suboccipital craniotomy on 7/2. He ultimately went to [facility] and has now been discharged home.   He was seen by Dr. Excell Seltzer for consultation of percutaneous PFO closure on 12/20/19. The patient is a Psychologist, occupational and there was some question about CO2 exposure contributing to his CVA. Plans were made for continue with his current medical therapy, work with physical and occupational therapy, and follow-up with Dr. Pearlean Brownie as scheduled in September. If he decided he wanted PFO closure, Dr. Excell Seltzer would perform a careful intracardiac echo study with agitated saline to confirm the presence of a clinically significant PFO and close at that time, if present.  He sought a second opinion at [facility] neurology. Another TCD bubble study was performed which was reportedly very abnormal.  The patient then called back to to schedule PFO closure.  Today he presents to clinic for follow up. No CP or SOB. No LE edema, orthopnea or PND.  No dizziness or syncope. No blood in stool or urine. No palpitations.      Past Medical History:  Diagnosis Date  . Arthritis    neck  . Carpal tunnel syndrome on both sides 08/2014  . DDD (degenerative disc disease), lumbar   . History of kidney stones   . Hypertension    under control with med., has been on med. x 1 yr.  . Sleep apnea    CPAP but does not use  . Stroke [facility]) 11/16/2019    Past Surgical History:  Procedure Laterality Date  . BUBBLE STUDY  11/21/2019   Procedure: BUBBLE STUDY;  Surgeon: [name], MD;  Location: [facility] ENDOSCOPY;  Service: Cardiovascular;;  . CARPAL TUNNEL RELEASE Left 09/07/2014   Procedure: LEFT CARPAL TUNNEL RELEASE;  Surgeon: [name], MD;  Location: [facility];  Service: Orthopedics;  Laterality: Left;  . CARPAL TUNNEL RELEASE Right 10/12/2014   Procedure: RIGHT CARPAL TUNNEL RELEASE;  Surgeon: [name], MD;  Location: [facility];  Service: Orthopedics;  Laterality: Right;  . KNEE ARTHROSCOPY Left   . SUBOCCIPITAL CRANIECTOMY CERVICAL LAMINECTOMY N/A 11/17/2019   Procedure: SUBOCCIPITAL DECOMPRESSIVE CRANIECTOMY;  Surgeon: [name], MD;  Location: [facility] OR;  Service: Neurosurgery;  Laterality: N/A;  . TEE WITHOUT CARDIOVERSION N/A 11/21/2019   Procedure: TRANSESOPHAGEAL ECHOCARDIOGRAM (TEE);  Surgeon: Christopher, Bridgette, MD;  Location: [facility] ENDOSCOPY;  Service: Cardiovascular;  Laterality: N/A;    Current Medications: Outpatient Medications Prior to Visit  Medication Sig Dispense Refill  . aspirin EC 81 MG EC tablet Take 1 tablet (81 mg total) by mouth daily. Swallow whole. 30 tablet 11  . atorvastatin (LIPITOR) 10 MG tablet Take 10 mg by mouth daily.    . butalbital-acetaminophen-caffeine (FIORICET) 50-325-40 MG tablet Take 2 tablets by mouth every 6 (six) hours as needed for headache. 14 tablet 0  . clonazePAM (KLONOPIN) 0.5 MG tablet Take 0.5 mg by mouth daily.    . losartan  (COZAAR) 25 MG tablet Take 25 mg by mouth daily.    . meclizine (ANTIVERT) 25 MG tablet TAKE 1 TABLET(25 MG) BY MOUTH THREE TIMES DAILY AS NEEDED FOR DIZZINESS OR NAUSEA 30 tablet 0  . acetaminophen (TYLENOL) 325 MG tablet Take 2 tablets (650 mg total) by mouth every 6 (six) hours as needed for mild pain or fever (for mild pain or fever >/=99.5). (Patient not taking: Reported on 01/24/2020)    . amLODipine (NORVASC) 10 MG tablet Take 1 tablet (10 mg total) by mouth daily. (Patient not taking: Reported on 01/16/2020) 30 tablet 0  . clonazePAM (KLONOPIN) 0.5 MG tablet Take 1 tablet (0.5 mg total) by mouth as needed (sleep). (Patient not taking: Reported on 01/24/2020) 20 tablet 0  . docusate sodium (COLACE) 100 MG capsule Take 1 capsule (100 mg total) by mouth 2 (two) times daily. (Patient not taking: Reported on 01/16/2020) 10 capsule 0  . propranolol (INDERAL) 40 MG tablet Take 1 tablet (40 mg total) by mouth 3 (three) times daily. (Patient not taking: Reported on 01/16/2020) 90 tablet 0  . topiramate (TOPAMAX) 100 MG tablet Take 1 tablet (100 mg total) by mouth 2 (two) times daily. (Patient not taking: Reported on 01/24/2020) 45 tablet 3  . traMADol (ULTRAM) 50 MG tablet Take 2 tablets (100 mg total) by mouth every 6 (six) hours as needed for moderate pain. (Patient not taking: Reported on 01/24/2020) 30 tablet 0   No facility-administered medications prior to visit.     Allergies:   Patient has no known allergies.   Social History   Socioeconomic History  . Marital status: Married    Spouse name: [name]  . Number of children: Not on file  . Years of education: Not on file  . Highest education level: Not on file  Occupational History  . Not on file  Tobacco Use  . Smoking status: Never Smoker  . Smokeless tobacco: Never Used  Vaping Use  . Vaping Use: Former  Substance and Sexual Activity  . Alcohol use: Not Currently    Comment: quit 2006  . Drug use: No  . Sexual activity: Yes  Other  Topics Concern  . Not on file  Social History Narrative   Right handed    Lives with wife    Social Determinants of Health   Financial Resource Strain:   . Difficulty of Paying Living Expenses: Not on file  Food Insecurity:   . Worried About Running Out of Food in the Last Year: Not on file  . Ran Out of Food in the Last Year: Not on file  Transportation Needs:   . Lack of Transportation (Medical): Not on file  . Lack of Transportation (Non-Medical): Not on file  Physical Activity:   . Days of Exercise per Week: Not on file  . Minutes of Exercise per Session: Not on   file  Stress:   . Feeling of Stress : Not on file  Social Connections:   . Frequency of Communication with Friends and Family: Not on file  . Frequency of Social Gatherings with Friends and Family: Not on file  . Attends Religious Services: Not on file  . Active Member of Clubs or Organizations: Not on file  . Attends Banker Meetings: Not on file  . Marital Status: Not on file     Family History:  The patient's family history includes Heart attack in his mother.     ROS:   Please see the history of present illness.    ROS All other systems reviewed and are negative.   PHYSICAL EXAM:   VS:  BP 138/60   Pulse 91   Ht 6\' 3"  (1.905 m)   Wt (!) 309 lb (140.2 kg)   SpO2 97%   BMI 38.62 kg/m    GEN: Well nourished, well developed, in no acute distress HEENT: normal Neck: no JVD or masses Cardiac: RRR; no murmurs, rubs, or gallops,no edema  Respiratory:  clear to auscultation bilaterally, normal work of breathing GI: soft, nontender, nondistended, + BS MS: no deformity or atrophy Skin: warm and dry, no rash Neuro:  Alert and Oriented x 3, Strength and sensation are intact Psych: euthymic mood, full affect   Wt Readings from Last 3 Encounters:  01/24/20 (!) 309 lb (140.2 kg)  01/16/20 (!) 301 lb 6.4 oz (136.7 kg)  12/26/19 290 lb (131.5 kg)      Studies/Labs Reviewed:   EKG:  EKG is  ordered today.  The ekg ordered today demonstrates sinus  Recent Labs: 11/16/2019: Magnesium 1.7; TSH 1.525 11/23/2019: Hemoglobin 14.6; Platelets 227 12/04/2019: ALT 206; BUN 20; Potassium 4.2; Sodium 141 12/06/2019: Creatinine, Ser 1.19   Lipid Panel    Component Value Date/Time   CHOL 181 11/16/2019 1623   TRIG 122 11/20/2019 0436   HDL 40 (L) 11/16/2019 1623   CHOLHDL 4.5 11/16/2019 1623   VLDL 7 11/16/2019 1623   LDLCALC 134 (H) 11/16/2019 1623    Additional studies/ records that were reviewed today include:  TCD Bubble Study: Summary:     A vascular evaluation was performed. The right middle cerebral artery was  studied. An IV was inserted into the patient's left AC fossa. Verbal  informed consent was obtained.    Mild to moderate HITS heard at rest. Unable to perform Valsalva due to  patient condition.   PFO size: Spencer degree III   TEE: IMPRESSIONS    1. Left ventricular ejection fraction, by estimation, is 60 to 65%. The  left ventricle has normal function. The left ventricle has no regional  wall motion abnormalities.  2. Right ventricular systolic function is normal. The right ventricular  size is normal.  3. No left atrial/left atrial appendage thrombus was detected.  4. The mitral valve is normal in structure. Trivial mitral valve  regurgitation. No evidence of mitral stenosis.  5. The aortic valve is tricuspid. Aortic valve regurgitation is trivial.  No aortic stenosis is present.  6. There is mild (Grade II) plaque involving the descending aorta.  7. No shunt seen by color flow doppler. Agitated saline contrast bubble  study was positive with shunting observed within 3-6 cardiac cycles  suggestive of interatrial shunt.   Conclusion(s)/Recommendation(s): Study positive for intra-atrial flow  communication (positive bubble study). Highly mobile intra-atrial septum,  but no clear PFO or ASD color flow seen despite multiple angles/windows.  Recommend consideration of additional  outpatient imaging (cardiac MRI vs. Cardiac CT) to further evaluation  anatomy prior to consideration of transcatheter closure.   MRI Brain: IMPRESSION: 4.1 cm acute left PICA territory ischemic infarct within the left cerebellar hemisphere. There is no significant posterior fossa mass effect at this time.  An additional punctate acute infarct is questioned within the left occipital lobe.  Otherwise unremarkable MRI appearance of the brain.  Mild ethmoid sinus mucosal thickening. Small bilateral maxillary sinus mucous retention cysts.   ASSESSMENT & PLAN:   PFO with atrial septal aneurysm: plan if for PFO closure next week. Labs ordered today. He will be continued on aspirin and loaded with plavix. Of note, pt is very worried about being awake during the procedure. I will let Dr. Excell Seltzerooper know he would like to be more heavily sedated during the procedure, if possible.   The patient is counseled about the association of PFO and cryptogenic stroke. Available clinical trial data is reviewed, specifically those trials comparing transcatheter PFO closure and medical therapy with antiplatelet drugs. The patient understands the potential benefit of PFO closure with respect to secondary stroke reduction compared with medical therapy alone. Specific risks of transcatheter PFO closure are reviewed with the patient. These risks include bleeding, infection, device embolization, stroke, cardiac perforation, tamponade, arrhythmia, MI, and late device erosion. He understands these serious risks occur at low incidence of < 1%.  The patient understands the need to take dual antiplatelet therapy with aspirin and clopidogrel together for a minimum period of 3 months following transcatheter PFO closure.  They understand the need to follow SBE prophylaxis per guidelines for a period of 6 months following PFO closure.  The patient provides full informed consent for the  procedure.    Medication Adjustments/Labs and Tests Ordered: Current medicines are reviewed at length with the patient today.  Concerns regarding medicines are outlined above.  Medication changes, Labs and Tests ordered today are listed in the Patient Instructions below. Patient Instructions  Medication Instructions:  1) START PLAVIX 75 mg daily tomorrow morning  *If you need a refill on your cardiac medications before your next appointment, please call your pharmacy*    COVID SCREENING INFORMATION (9/13):  You are scheduled for your drive-thru COVID screening on 01/29/2020 between 11AM and 1PM.  Pre-Procedural COVID-19 Testing Site  [address] [place], [place] [address]  You will need to go home after your screening and quarantine until your procedure.    PFO CLOSURE INSTRUCTIONS (9/15):  You are scheduled for a PFO CLOSURE on Wednesday, {September 15 with Dr. Excell Seltzerooper.   1. [facility]: [address] [place], [place] [address] at 6:30AM (This time is two hours before your procedure to ensure your preparation). Free valet parking service is available. You are allowed ONE guest in the waiting room during your procedure. Both you and your guest must wear masks.  Special note: Every effort is made to have your procedure done on time. Please understand that emergencies sometimes delay scheduled procedures.   2. Diet: Stay well hydrated the day before your procedure! Do not eat solid foods after midnight. You may have clear liquids until 5am upon the day of the procedure.   3. Labs: TODAY! BMET, CBC   4. Medication instructions in preparation for your procedure:      1) MAKE SURE TO TAKE YOUR ASPIRIN AND PLAVIX the morning of your procedure  2) You may take your other medications as directed with sips of water   5. Plan for one night stay--bring personal belongings (this is a Risk manager, not an intention. We  want you to go home!).  6. Bring a current list of your medications and current insurance cards.  7. You MUST have a responsible person to drive you home.  8. Someone MUST be with you the first 24 hours after you arrive home or your discharge will be delayed.  9. Please wear clothes that are easy to get on and off and wear slip-on shoes.    FOLLOW-UP APPOINTMENT (10/13):  You are scheduled for a follow-up appointment on 10/13 at 2:30PM with Carlean Jews, PA.      Signed, Cline Crock, [name]  01/24/2020 4:00 PM    [facility] [address] [place], [place], [place]  [address] Phone: [phone]; Fax: [phone]

## 2020-01-23 NOTE — H&P (View-Only) (Signed)
[facility]   MULTIDISCIPLINARY HEART VALVE CLINIC                                       Cardiology Office Note    Date:  01/24/2020   ID:  [name], DOB 29-Mar-1971, MRN [id number]  PCP:  Sigmund Hazel, MD  Cardiologist:  Dr. Excell Seltzer   CC: set up PFO closure   History of Present Illness:  [name] is a 49 y.o. male with a history of CVA and PFO who presents to clinic to discuss percutaneous PFO closure.   The patient was in his normal state of health on 11/16/19 when he developed dizziness and gait abnormality, prompting ER evaluation. He was diagnosed with a left PICA territory infarction and a questionable punctate infarct in the left occipital lobe. CTA studies showed no large vessel atherosclerosis or stenosis. Echo was normal. TCD bubble study was positive for Spencer grade 2-3 shunt at rest. TEE showed a positive bubble study but no clear color flow across the interatrial septum. The patient ultimately developed obstructive hydrocephalus and underwent suboccipital craniotomy on 7/2. He ultimately went to [facility] and has now been discharged home.   He was seen by Dr. Excell Seltzer for consultation of percutaneous PFO closure on 12/20/19. The patient is a Psychologist, occupational and there was some question about CO2 exposure contributing to his CVA. Plans were made for continue with his current medical therapy, work with physical and occupational therapy, and follow-up with Dr. Pearlean Brownie as scheduled in September. If he decided he wanted PFO closure, Dr. Excell Seltzer would perform a careful intracardiac echo study with agitated saline to confirm the presence of a clinically significant PFO and close at that time, if present.  He sought a second opinion at [facility] neurology. Another TCD bubble study was performed which was reportedly very abnormal.  The patient then called back to to schedule PFO closure.  Today he presents to clinic for follow up. No CP or SOB. No LE edema, orthopnea or PND.  No dizziness or syncope. No blood in stool or urine. No palpitations.      Past Medical History:  Diagnosis Date  . Arthritis    neck  . Carpal tunnel syndrome on both sides 08/2014  . DDD (degenerative disc disease), lumbar   . History of kidney stones   . Hypertension    under control with med., has been on med. x 1 yr.  . Sleep apnea    CPAP but does not use  . Stroke [facility]) 11/16/2019    Past Surgical History:  Procedure Laterality Date  . BUBBLE STUDY  11/21/2019   Procedure: BUBBLE STUDY;  Surgeon: [name], MD;  Location: [facility] ENDOSCOPY;  Service: Cardiovascular;;  . CARPAL TUNNEL RELEASE Left 09/07/2014   Procedure: LEFT CARPAL TUNNEL RELEASE;  Surgeon: [name], MD;  Location: [facility];  Service: Orthopedics;  Laterality: Left;  . CARPAL TUNNEL RELEASE Right 10/12/2014   Procedure: RIGHT CARPAL TUNNEL RELEASE;  Surgeon: [name], MD;  Location: [facility];  Service: Orthopedics;  Laterality: Right;  . KNEE ARTHROSCOPY Left   . SUBOCCIPITAL CRANIECTOMY CERVICAL LAMINECTOMY N/A 11/17/2019   Procedure: SUBOCCIPITAL DECOMPRESSIVE CRANIECTOMY;  Surgeon: [name], MD;  Location: [facility] OR;  Service: Neurosurgery;  Laterality: N/A;  . TEE WITHOUT CARDIOVERSION N/A 11/21/2019   Procedure: TRANSESOPHAGEAL ECHOCARDIOGRAM (TEE);  Surgeon: [name], MD;  Location: [facility] ENDOSCOPY;  Service: Cardiovascular;  Laterality: N/A;    Current Medications: Outpatient Medications Prior to Visit  Medication Sig Dispense Refill  . aspirin EC 81 MG EC tablet Take 1 tablet (81 mg total) by mouth daily. Swallow whole. 30 tablet 11  . atorvastatin (LIPITOR) 10 MG tablet Take 10 mg by mouth daily.    . butalbital-acetaminophen-caffeine (FIORICET) 50-325-40 MG tablet Take 2 tablets by mouth every 6 (six) hours as needed for headache. 14 tablet 0  . clonazePAM (KLONOPIN) 0.5 MG tablet Take 0.5 mg by mouth daily.    Marland Kitchen losartan  (COZAAR) 25 MG tablet Take 25 mg by mouth daily.    . meclizine (ANTIVERT) 25 MG tablet TAKE 1 TABLET(25 MG) BY MOUTH THREE TIMES DAILY AS NEEDED FOR DIZZINESS OR NAUSEA 30 tablet 0  . acetaminophen (TYLENOL) 325 MG tablet Take 2 tablets (650 mg total) by mouth every 6 (six) hours as needed for mild pain or fever (for mild pain or fever >/=99.5). (Patient not taking: Reported on 01/24/2020)    . amLODipine (NORVASC) 10 MG tablet Take 1 tablet (10 mg total) by mouth daily. (Patient not taking: Reported on 01/16/2020) 30 tablet 0  . clonazePAM (KLONOPIN) 0.5 MG tablet Take 1 tablet (0.5 mg total) by mouth as needed (sleep). (Patient not taking: Reported on 01/24/2020) 20 tablet 0  . docusate sodium (COLACE) 100 MG capsule Take 1 capsule (100 mg total) by mouth 2 (two) times daily. (Patient not taking: Reported on 01/16/2020) 10 capsule 0  . propranolol (INDERAL) 40 MG tablet Take 1 tablet (40 mg total) by mouth 3 (three) times daily. (Patient not taking: Reported on 01/16/2020) 90 tablet 0  . topiramate (TOPAMAX) 100 MG tablet Take 1 tablet (100 mg total) by mouth 2 (two) times daily. (Patient not taking: Reported on 01/24/2020) 45 tablet 3  . traMADol (ULTRAM) 50 MG tablet Take 2 tablets (100 mg total) by mouth every 6 (six) hours as needed for moderate pain. (Patient not taking: Reported on 01/24/2020) 30 tablet 0   No facility-administered medications prior to visit.     Allergies:   Patient has no known allergies.   Social History   Socioeconomic History  . Marital status: Married    Spouse name: [name]  . Number of children: Not on file  . Years of education: Not on file  . Highest education level: Not on file  Occupational History  . Not on file  Tobacco Use  . Smoking status: Never Smoker  . Smokeless tobacco: Never Used  Vaping Use  . Vaping Use: Former  Substance and Sexual Activity  . Alcohol use: Not Currently    Comment: quit 2006  . Drug use: No  . Sexual activity: Yes  Other  Topics Concern  . Not on file  Social History Narrative   Right handed    Lives with wife    Social Determinants of Health   Financial Resource Strain:   . Difficulty of Paying Living Expenses: Not on file  Food Insecurity:   . Worried About Programme researcher, broadcasting/film/video in the Last Year: Not on file  . Ran Out of Food in the Last Year: Not on file  Transportation Needs:   . Lack of Transportation (Medical): Not on file  . Lack of Transportation (Non-Medical): Not on file  Physical Activity:   . Days of Exercise per Week: Not on file  . Minutes of Exercise per Session: Not on  file  Stress:   . Feeling of Stress : Not on file  Social Connections:   . Frequency of Communication with Friends and Family: Not on file  . Frequency of Social Gatherings with Friends and Family: Not on file  . Attends Religious Services: Not on file  . Active Member of Clubs or Organizations: Not on file  . Attends Banker Meetings: Not on file  . Marital Status: Not on file     Family History:  The patient's family history includes Heart attack in his mother.     ROS:   Please see the history of present illness.    ROS All other systems reviewed and are negative.   PHYSICAL EXAM:   VS:  BP 138/60   Pulse 91   Ht 6\' 3"  (1.905 m)   Wt (!) 309 lb (140.2 kg)   SpO2 97%   BMI 38.62 kg/m    GEN: Well nourished, well developed, in no acute distress HEENT: normal Neck: no JVD or masses Cardiac: RRR; no murmurs, rubs, or gallops,no edema  Respiratory:  clear to auscultation bilaterally, normal work of breathing GI: soft, nontender, nondistended, + BS MS: no deformity or atrophy Skin: warm and dry, no rash Neuro:  Alert and Oriented x 3, Strength and sensation are intact Psych: euthymic mood, full affect   Wt Readings from Last 3 Encounters:  01/24/20 (!) 309 lb (140.2 kg)  01/16/20 (!) 301 lb 6.4 oz (136.7 kg)  12/26/19 290 lb (131.5 kg)      Studies/Labs Reviewed:   EKG:  EKG is  ordered today.  The ekg ordered today demonstrates sinus  Recent Labs: 11/16/2019: Magnesium 1.7; TSH 1.525 11/23/2019: Hemoglobin 14.6; Platelets 227 12/04/2019: ALT 206; BUN 20; Potassium 4.2; Sodium 141 12/06/2019: Creatinine, Ser 1.19   Lipid Panel    Component Value Date/Time   CHOL 181 11/16/2019 1623   TRIG 122 11/20/2019 0436   HDL 40 (L) 11/16/2019 1623   CHOLHDL 4.5 11/16/2019 1623   VLDL 7 11/16/2019 1623   LDLCALC 134 (H) 11/16/2019 1623    Additional studies/ records that were reviewed today include:  TCD Bubble Study: Summary:     A vascular evaluation was performed. The right middle cerebral artery was  studied. An IV was inserted into the patient's left AC fossa. Verbal  informed consent was obtained.    Mild to moderate HITS heard at rest. Unable to perform Valsalva due to  patient condition.   PFO size: Spencer degree III   TEE: IMPRESSIONS    1. Left ventricular ejection fraction, by estimation, is 60 to 65%. The  left ventricle has normal function. The left ventricle has no regional  wall motion abnormalities.  2. Right ventricular systolic function is normal. The right ventricular  size is normal.  3. No left atrial/left atrial appendage thrombus was detected.  4. The mitral valve is normal in structure. Trivial mitral valve  regurgitation. No evidence of mitral stenosis.  5. The aortic valve is tricuspid. Aortic valve regurgitation is trivial.  No aortic stenosis is present.  6. There is mild (Grade II) plaque involving the descending aorta.  7. No shunt seen by color flow doppler. Agitated saline contrast bubble  study was positive with shunting observed within 3-6 cardiac cycles  suggestive of interatrial shunt.   Conclusion(s)/Recommendation(s): Study positive for intra-atrial flow  communication (positive bubble study). Highly mobile intra-atrial septum,  but no clear PFO or ASD color flow seen despite multiple angles/windows.  Recommend consideration of additional  outpatient imaging (cardiac MRI vs. Cardiac CT) to further evaluation  anatomy prior to consideration of transcatheter closure.   MRI Brain: IMPRESSION: 4.1 cm acute left PICA territory ischemic infarct within the left cerebellar hemisphere. There is no significant posterior fossa mass effect at this time.  An additional punctate acute infarct is questioned within the left occipital lobe.  Otherwise unremarkable MRI appearance of the brain.  Mild ethmoid sinus mucosal thickening. Small bilateral maxillary sinus mucous retention cysts.   ASSESSMENT & PLAN:   PFO with atrial septal aneurysm: plan if for PFO closure next week. Labs ordered today. He will be continued on aspirin and loaded with plavix. Of note, pt is very worried about being awake during the procedure. I will let Dr. Excell Seltzerooper know he would like to be more heavily sedated during the procedure, if possible.   The patient is counseled about the association of PFO and cryptogenic stroke. Available clinical trial data is reviewed, specifically those trials comparing transcatheter PFO closure and medical therapy with antiplatelet drugs. The patient understands the potential benefit of PFO closure with respect to secondary stroke reduction compared with medical therapy alone. Specific risks of transcatheter PFO closure are reviewed with the patient. These risks include bleeding, infection, device embolization, stroke, cardiac perforation, tamponade, arrhythmia, MI, and late device erosion. He understands these serious risks occur at low incidence of < 1%.  The patient understands the need to take dual antiplatelet therapy with aspirin and clopidogrel together for a minimum period of 3 months following transcatheter PFO closure.  They understand the need to follow SBE prophylaxis per guidelines for a period of 6 months following PFO closure.  The patient provides full informed consent for the  procedure.    Medication Adjustments/Labs and Tests Ordered: Current medicines are reviewed at length with the patient today.  Concerns regarding medicines are outlined above.  Medication changes, Labs and Tests ordered today are listed in the Patient Instructions below. Patient Instructions  Medication Instructions:  1) START PLAVIX 75 mg daily tomorrow morning  *If you need a refill on your cardiac medications before your next appointment, please call your pharmacy*    COVID SCREENING INFORMATION (9/13):  You are scheduled for your drive-thru COVID screening on 01/29/2020 between 11AM and 1PM.  Pre-Procedural COVID-19 Testing Site  [address] [place], [place] [address]  You will need to go home after your screening and quarantine until your procedure.    PFO CLOSURE INSTRUCTIONS (9/15):  You are scheduled for a PFO CLOSURE on Wednesday, {September 15 with Dr. Excell Seltzerooper.   1. [facility]: [address] [place], [place] [address] at 6:30AM (This time is two hours before your procedure to ensure your preparation). Free valet parking service is available. You are allowed ONE guest in the waiting room during your procedure. Both you and your guest must wear masks.  Special note: Every effort is made to have your procedure done on time. Please understand that emergencies sometimes delay scheduled procedures.   2. Diet: Stay well hydrated the day before your procedure! Do not eat solid foods after midnight. You may have clear liquids until 5am upon the day of the procedure.   3. Labs: TODAY! BMET, CBC   4. Medication instructions in preparation for your procedure:      1) MAKE SURE TO TAKE YOUR ASPIRIN AND PLAVIX the morning of your procedure  2) You may take your other medications as directed with sips of water   5. Plan for one night stay--bring personal belongings (this is a Risk manager, not an intention. We  want you to go home!).  6. Bring a current list of your medications and current insurance cards.  7. You MUST have a responsible person to drive you home.  8. Someone MUST be with you the first 24 hours after you arrive home or your discharge will be delayed.  9. Please wear clothes that are easy to get on and off and wear slip-on shoes.    FOLLOW-UP APPOINTMENT (10/13):  You are scheduled for a follow-up appointment on 10/13 at 2:30PM with Carlean Jews, PA.      Signed, Cline Crock, [name]  01/24/2020 4:00 PM    [facility] [address] [place], [place], [place]  [address] Phone: [phone]; Fax: [phone]

## 2020-01-24 ENCOUNTER — Encounter: Payer: Self-pay | Admitting: Physician Assistant

## 2020-01-24 ENCOUNTER — Ambulatory Visit (INDEPENDENT_AMBULATORY_CARE_PROVIDER_SITE_OTHER): Payer: 59 | Admitting: Physician Assistant

## 2020-01-24 ENCOUNTER — Telehealth: Payer: Self-pay | Admitting: Cardiovascular Disease

## 2020-01-24 ENCOUNTER — Ambulatory Visit: Payer: 59

## 2020-01-24 ENCOUNTER — Other Ambulatory Visit: Payer: Self-pay

## 2020-01-24 ENCOUNTER — Ambulatory Visit: Payer: 59 | Admitting: Occupational Therapy

## 2020-01-24 VITALS — BP 138/60 | HR 91 | Ht 75.0 in | Wt 309.0 lb

## 2020-01-24 DIAGNOSIS — I69351 Hemiplegia and hemiparesis following cerebral infarction affecting right dominant side: Secondary | ICD-10-CM

## 2020-01-24 DIAGNOSIS — R41842 Visuospatial deficit: Secondary | ICD-10-CM

## 2020-01-24 DIAGNOSIS — Q211 Atrial septal defect: Secondary | ICD-10-CM | POA: Diagnosis not present

## 2020-01-24 DIAGNOSIS — Q2112 Patent foramen ovale: Secondary | ICD-10-CM

## 2020-01-24 DIAGNOSIS — M21371 Foot drop, right foot: Secondary | ICD-10-CM

## 2020-01-24 DIAGNOSIS — R2681 Unsteadiness on feet: Secondary | ICD-10-CM

## 2020-01-24 DIAGNOSIS — R2689 Other abnormalities of gait and mobility: Secondary | ICD-10-CM

## 2020-01-24 DIAGNOSIS — M6281 Muscle weakness (generalized): Secondary | ICD-10-CM

## 2020-01-24 DIAGNOSIS — I69318 Other symptoms and signs involving cognitive functions following cerebral infarction: Secondary | ICD-10-CM

## 2020-01-24 MED ORDER — CLOPIDOGREL BISULFATE 75 MG PO TABS: 75.0000 mg | ORAL_TABLET | Freq: Every day | ORAL | 3 refills | Status: DC

## 2020-01-24 NOTE — Telephone Encounter (Signed)
Informed patient's wife he does not need to be fasting for blood work today. She was grateful for assistance.

## 2020-01-24 NOTE — Therapy (Signed)
[facility] [address] [place], [place], [address] Phone: [phone]   Fax:  [phone]  Occupational Therapy Treatment  Patient Details  Name: [name] MRN: [id number] Date of Birth: 16-Apr-1971 No data recorded  Encounter Date: 01/24/2020   OT End of Session - 01/24/20 1010    Visit Number 4    Number of Visits 9    Authorization Type [employer] - 60 visit combined PT/OT/ST limit (hard max)    OT Start Time 0930    OT Stop Time 1015    OT Time Calculation (min) 45 min    Activity Tolerance Patient tolerated treatment well    Behavior During Therapy [facility] for tasks assessed/performed           Past Medical History:  Diagnosis Date  . Arthritis    neck  . Carpal tunnel syndrome on both sides 08/2014  . DDD (degenerative disc disease), lumbar   . History of kidney stones   . Hypertension    under control with med., has been on med. x 1 yr.  . Sleep apnea    CPAP but does not use  . Stroke [facility]) 11/16/2019    Past Surgical History:  Procedure Laterality Date  . BUBBLE STUDY  11/21/2019   Procedure: BUBBLE STUDY;  Surgeon: [name], MD;  Location: [facility] ENDOSCOPY;  Service: Cardiovascular;;  . CARPAL TUNNEL RELEASE Left 09/07/2014   Procedure: LEFT CARPAL TUNNEL RELEASE;  Surgeon: [name], MD;  Location: [facility];  Service: Orthopedics;  Laterality: Left;  . CARPAL TUNNEL RELEASE Right 10/12/2014   Procedure: RIGHT CARPAL TUNNEL RELEASE;  Surgeon: [name], MD;  Location: [facility];  Service: Orthopedics;  Laterality: Right;  . KNEE ARTHROSCOPY Left   . SUBOCCIPITAL CRANIECTOMY CERVICAL LAMINECTOMY N/A 11/17/2019   Procedure: SUBOCCIPITAL DECOMPRESSIVE CRANIECTOMY;  Surgeon: [name], MD;  Location: [facility] OR;  Service: Neurosurgery;  Laterality: N/A;  . TEE WITHOUT CARDIOVERSION N/A 11/21/2019   Procedure: TRANSESOPHAGEAL ECHOCARDIOGRAM (TEE);  Surgeon:  [name], MD;  Location: [facility] ENDOSCOPY;  Service: Cardiovascular;  Laterality: N/A;    There were no vitals filed for this visit.   Subjective Assessment - 01/24/20 0935    Subjective  I slipped off the bed over the weekend and I've had a bad few days    Pertinent History LT PICA infarct. PMH: HTN, HLD    Limitations No driving    Currently in Pain? Yes    Pain Score 7     Pain Location Generalized    Pain Descriptors / Indicators Sore;Tender    Pain Type Acute pain    Pain Onset More than a month ago    Pain Frequency Constant    Aggravating Factors  worse in the AM    Pain Relieving Factors hot shower           Seated: quick head turns to Rt and Lt calling out cards with only reports of "uneasiness" not dizziness with more extreme head turns. Pt however does report dizziness doing this in standing.  Practiced sit to stand on foam surface x 5. Pt also practiced going down to floor on knees and getting back up as pt reports he often has to do this vs. Bending over.  Practiced bending to pick things up from low surface w/ one hand countertop support each hand.   High level attention task with radio in background in moderately distracting gym - pt omitted  2 tasks and did not allow enough time for a couple tasks.                          OT Long Term Goals - 01/17/20 0912      OT LONG TERM GOAL #1   Title Pt will perform environmental scanning w/ DME prn at 90% accuracy with dizziness less than or equal to 3/10    Time 8    Period Weeks    Status On-going   01/17/20: pt found 12/13 items on first pass     OT LONG TERM GOAL #2   Title Pt will read page of text (larger print prn) with only min difficulty    Time 8    Period Weeks    Status On-going      OT LONG TERM GOAL #3   Title Pt will perform dynamic standing activities (reaching low/high) w/ one hand countertop support and no LOB    Time 8    Period Weeks    Status On-going      OT  LONG TERM GOAL #4   Title Pt will verbalize understanding with memory strategies and other compensations prn    Time 8    Period Weeks    Status Achieved      OT LONG TERM GOAL #5   Title Pt/family will verbalize understanding of driving eval info prn    Time 8    Period Weeks    Status New                 Plan - 01/24/20 1011    Clinical Impression Statement Pt progressing towards all remaining goals. Pt demo good safety awareness w/ dynmaic standing balance. Pt limited by pain and occasional dizziness    OT Occupational Profile and History Problem Focused Assessment - Including review of records relating to presenting problem    Occupational performance deficits (Please refer to evaluation for details): IADL's;Work;Leisure    Body Structure / Function / Physical Skills IADL;Vestibular;Coordination;Vision    Cognitive Skills Memory;Attention;Safety Awareness    Rehab Potential Excellent    Clinical Decision Making Limited treatment options, no task modification necessary    Comorbidities Affecting Occupational Performance: None    Modification or Assistance to Complete Evaluation  No modification of tasks or assist necessary to complete eval    OT Frequency 2x / week   however most likely will only see 1x/wk due to scheduling conflicts and family preference   OT Duration 8 weeks    OT Treatment/Interventions Self-care/ADL training;Functional Mobility Training;Neuromuscular education;Therapeutic activities;Coping strategies training;Visual/perceptual remediation/compensation;Patient/family education;DME and/or AE instruction;Cognitive remediation/compensation    Plan assess goals and possibly d/c next session if he meets all goals. Pt may need to cancel next weeks appointments however due to upcoming surgery - pt instructed will need resume orders to return    Consulted and Agree with Plan of Care Patient           Patient will benefit from skilled therapeutic intervention in  order to improve the following deficits and impairments:   Body Structure / Function / Physical Skills: IADL, Vestibular, Coordination, Vision Cognitive Skills: Memory, Attention, Safety Awareness     Visit Diagnosis: Unsteadiness on feet  Visuospatial deficit  Other symptoms and signs involving cognitive functions following cerebral infarction  Muscle weakness (generalized)    Problem List Patient Active Problem List   Diagnosis Date Noted  . Transaminitis   . PFO (  patent foramen ovale) 11/22/2019  . Headache 11/22/2019  . Cerebral edema (HCC) 11/22/2019  . Chest pain 11/22/2019  . Hyperlipidemia LDL goal <70 11/22/2019  . OSA (obstructive sleep apnea) 11/22/2019  . 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 Vascular headache   . Dyslipidemia   . Hypertension   . Intractable nausea and vomiting   . Hypokalemia   . Depression with anxiety   . Ischemic stroke (HCC) - L PICA infarct embolic in setting of PFO s/p crani   . Lumbar spondylolysis 07/31/2019  . Lumbar radiculopathy 04/07/2019  . Right foot drop 04/07/2019  . Backache 07/05/2017  . DDD (degenerative disc disease), lumbar 07/05/2017  . Spinal stenosis of lumbar region 07/05/2017    Kelli Churn, OTR/L 01/24/2020, 10:29 AM  [facility] [address] [place], [place], [address] Phone: [phone]   Fax:  [phone]  Name: [name] MRN: [id number] Date of Birth: Apr 17, 1971

## 2020-01-24 NOTE — Patient Instructions (Signed)
Medication Instructions:  1) START PLAVIX 75 mg daily tomorrow morning  *If you need a refill on your cardiac medications before your next appointment, please call your pharmacy*    COVID SCREENING INFORMATION (9/13):  You are scheduled for your drive-thru COVID screening on 01/29/2020 between 11AM and 1PM.  Pre-Procedural COVID-19 Testing Site  [address] [place], [place] [address]  You will need to go home after your screening and quarantine until your procedure.    PFO CLOSURE INSTRUCTIONS (9/15):  You are scheduled for a PFO CLOSURE on Wednesday, {September 15 with Dr. Excell Seltzer.   1. Please arrive at the [facility] (Main Entrance A) at [facility]: [address] [place], [place] [address] at 6:30AM (This time is two hours before your procedure to ensure your preparation). Free valet parking service is available. You are allowed ONE guest in the waiting room during your procedure. Both you and your guest must wear masks.  Special note: Every effort is made to have your procedure done on time. Please understand that emergencies sometimes delay scheduled procedures.   2. Diet: Stay well hydrated the day before your procedure! Do not eat solid foods after midnight. You may have clear liquids until 5am upon the day of the procedure.   3. Labs: TODAY! BMET, CBC   4. Medication instructions in preparation for your procedure:      1) MAKE SURE TO TAKE YOUR ASPIRIN AND PLAVIX the morning of your procedure      2) You may take your other medications as directed with sips of water   5. Plan for one night stay--bring personal belongings (this is a Risk manager, not an intention. We want you to go home!).  6. Bring a current list of your medications and current insurance cards.  7. You MUST have a responsible person to drive you home.  8. Someone MUST be with you the first 24 hours after you arrive home or your discharge will be delayed.  9. Please wear clothes that are  easy to get on and off and wear slip-on shoes.    FOLLOW-UP APPOINTMENT (10/13):  You are scheduled for a follow-up appointment on 10/13 at 2:30PM with Carlean Jews, PA.

## 2020-01-24 NOTE — Therapy (Signed)
[facility] [address] [place], [place], [address] Phone: [phone]   Fax:  [phone]  Physical Therapy Treatment  Patient Details  Name: [name] MRN: [id number] Date of Birth: 07-16-1970 Referring Provider (PT): [name], [name], [name], MD   Encounter Date: 01/24/2020   PT End of Session - 01/24/20 0855    Visit Number 8    Number of Visits 17    Date for PT Re-Evaluation 03/24/20   POC for 8 weeks, Cert for 90 days   Authorization Type [employer]    PT Start Time [phone]   pt arriving late   PT Stop Time 0930    PT Time Calculation (min) 36 min    Equipment Utilized During Treatment Gait belt    Activity Tolerance Patient tolerated treatment well;No increased pain    Behavior During Therapy WFL for tasks assessed/performed           Past Medical History:  Diagnosis Date  . Arthritis    neck  . Carpal tunnel syndrome on both sides 08/2014  . DDD (degenerative disc disease), lumbar   . History of kidney stones   . Hypertension    under control with med., has been on med. x 1 yr.  . Sleep apnea    CPAP but does not use  . Stroke [facility]) 11/16/2019    Past Surgical History:  Procedure Laterality Date  . BUBBLE STUDY  11/21/2019   Procedure: BUBBLE STUDY;  Surgeon: [name], MD;  Location: [facility] ENDOSCOPY;  Service: Cardiovascular;;  . CARPAL TUNNEL RELEASE Left 09/07/2014   Procedure: LEFT CARPAL TUNNEL RELEASE;  Surgeon: [name], MD;  Location: [facility];  Service: Orthopedics;  Laterality: Left;  . CARPAL TUNNEL RELEASE Right 10/12/2014   Procedure: RIGHT CARPAL TUNNEL RELEASE;  Surgeon: [name], MD;  Location: [facility];  Service: Orthopedics;  Laterality: Right;  . KNEE ARTHROSCOPY Left   . SUBOCCIPITAL CRANIECTOMY CERVICAL LAMINECTOMY N/A 11/17/2019   Procedure: SUBOCCIPITAL DECOMPRESSIVE  CRANIECTOMY;  Surgeon: [name], MD;  Location: [facility] OR;  Service: Neurosurgery;  Laterality: N/A;  . TEE WITHOUT CARDIOVERSION N/A 11/21/2019   Procedure: TRANSESOPHAGEAL ECHOCARDIOGRAM (TEE);  Surgeon: [name], MD;  Location: [facility] ENDOSCOPY;  Service: Cardiovascular;  Laterality: N/A;    There were no vitals filed for this visit.   Subjective Assessment - 01/24/20 0856    Subjective Patient reports that he has been having general pain all ove. Had one fall off the bed where he got close to edge and slipped off. Patient is walking with small quad base cane. Did recieve AFO for RLE. Patient reports has been having headaches. Patient reports that he has procedure next week.    Patient is accompained by: Family member   Wife [name]   Pertinent History hypertension, hyperlipidemia, depression/anxiety, degenerative disc disease in lumbar region, obesity, sleep apnea    Limitations Walking;Standing    Patient Stated Goals Back to Normal, Do the things I did prior    Currently in Pain? Yes    Pain Score 7     Pain Location Generalized    Pain Orientation --   generalized all over   Pain Descriptors / Indicators Sore;Tender    Pain Type Acute pain    Pain Onset More than a month ago  [facility] Adult PT Treatment/Exercise - 01/24/20 0001      Ambulation/Gait   Ambulation/Gait Yes    Ambulation/Gait Assistance 4: Min guard    Ambulation/Gait Assistance Details completed gait training without AD and with R AFO donned x 350 ft. Patient demonstrates improved step length and confidence with ambulation with AFO donned.  PT providing CGA throughout.     Ambulation Distance (Feet) 350 Feet    Assistive device None    Gait Pattern Step-through pattern;Decreased step length - left;Decreased stance time - right;Decreased dorsiflexion - right;Right steppage;Wide base of support;Poor foot clearance - right    Ambulation Surface Level;Indoor                Balance Exercises - 01/24/20 0001      Balance Exercises: Standing   Tandem Stance Eyes open;Intermittent upper extremity support;3 reps;Time    Tandem Stance Limitations 15-20 seconds. increased difficutly with RLE posterior requiring intermittent UE support and CGA    Rockerboard Anterior/posterior;Lateral;Head turns;EO;Intermittent UE support;Limitations    Rockerboard Limitations stnading on rockerboard positioned ant/post, focus on holding steady 3 x 30 seconds with eyes open. Then completed alternating shoulder flexion x 10 reps bilaterally to further challenge balance. Progressed to completed vertical head turns x 10 reps. Increased difficulty noted with head turns. with board positioned laterally, focused on holding steady 4 x 30 seconds, increased challenge to balance and more sway noted with board laterally. Completed horizontal head turns x 10 reps. intermittent UE support required. Increased difficulty on board positioned laterally > ant/post.     Step Over Hurdles / Cones Completed forward stepping over hurdles in // bars, with focus on reciprocal stepping x 5 laps, down and back. Increased difficulty stepping with LLE due to increased balance challenge with stance on RLE. Progressed to completing lateral stepping over to R/L x 3 laps, down and back. verbal cues for foot placement. All completed without UE support, increased balance challenge noted at times requiring intermittent UE support.                PT Short Term Goals - 01/01/20 1254      PT SHORT TERM GOAL #1   Title patient will be independent with initial HEP for strengthening/balance (All STG's Due: 01/22/20)    Baseline no HEP established    Time 4    Period Weeks    Status New    Target Date 01/22/20      PT SHORT TERM GOAL #2   Title Patient will demo ability to ambulate >500 ft on indoor level surfaces with LRAD and supervision to demo improved household mobility    Baseline 100, CGA with  Rollator    Time 4    Period Weeks    Status New      PT SHORT TERM GOAL #3   Title Patient will improve TUG score w/ LRAD to </= 25 seconds to demo improved functional mobility    Baseline 35.84 secs    Time 4    Period Weeks    Status New      PT SHORT TERM GOAL #4   Title Patient will improve 5x sit <> stand to </= 30 seconds to demonstrate improved functional strength    Baseline 49.56 secs w/ UE support    Time 4    Period Weeks    Status New      PT SHORT TERM GOAL #5   Title Berg Balance to be assessed and LTG to be set  as appropriate    Baseline baseline established LTG set.    Time 4    Period Weeks    Status Achieved             PT Long Term Goals - 01/01/20 1255      PT LONG TERM GOAL #1   Title Patient will be independent with final HEP for strengthening/balance (All LTG's due: 02/19/20)    Baseline HEP established    Time 8    Period Weeks    Status New      PT LONG TERM GOAL #2   Title Patient will demonstrate ability to ambulate >1000 on indoor/outdoor surfaces with LRAD and supervision for improved community mobility    Baseline 100 ft CGA    Time 8    Period Weeks    Status New      PT LONG TERM GOAL #3   Title Patient will improve gait speed to >/= 2.0 ft/sec  with LRAD to demonstrate improved community ambulation    Baseline 0.69 ft/sec    Time 8    Period Weeks    Status New      PT LONG TERM GOAL #4   Title Patient will improve 5x sit <> stand to </= 20 secs to demonstrate improved functional mobility    Baseline 49.56 secs    Time 8    Period Weeks    Status New      PT LONG TERM GOAL #5   Title Patient will improve Tug to </= 15 seconds with LRAD to demo improved mobility and reduced fall risk    Baseline 35.84 secs    Time 8    Period Weeks    Status New      PT LONG TERM GOAL #6   Title Patient will improve Berg Balance to >/= 45/56 to demonstrate reduced fall risk and improved balance    Baseline 39/56    Time 8     Period Weeks    Status New                 Plan - 01/24/20 3335    Clinical Impression Statement Patient recieved R AFO and completed gait training with this donned today. Patient demonstrate improved step length and gait pattern with AFO donned. Continued balance activities as tolerated by patient. Will continue to progress toward LTGs.    Personal Factors and Comorbidities Comorbidity 3+    Comorbidities hypertension, hyperlipidemia, depression/anxiety, degenerative disc disease in lumbar region, obesity, sleep apnea    Examination-Activity Limitations Bend;Transfers;Stairs;Stand;Lift    Examination-Participation Restrictions Occupation;Yard Work;Community Activity;Driving    Stability/Clinical Decision Making Evolving/Moderate complexity    Rehab Potential Good    PT Frequency 2x / week    PT Duration 8 weeks    PT Treatment/Interventions ADLs/Self Care Home Management;Aquatic Therapy;Cryotherapy;Electrical Stimulation;Moist Heat;DME Instruction;Gait training;Stair training;Functional mobility training;Therapeutic activities;Therapeutic exercise;Balance training;Neuromuscular re-education;Patient/family education;Orthotic Fit/Training;Manual techniques;Passive range of motion;Vestibular    PT Next Visit Plan Continue gait without AD. Functional [place] (standing), Balance Exercises. Slowly incorporating vision removed, retropulsion noted with EC.    Consulted and Agree with Plan of Care Patient;Family member/caregiver    Family Member Consulted Wife           Patient will benefit from skilled therapeutic intervention in order to improve the following deficits and impairments:  Abnormal gait, Decreased balance, Decreased endurance, Difficulty walking, Impaired sensation, Dizziness, Decreased range of motion, Decreased activity tolerance, Decreased knowledge of use of DME, Decreased strength,  Pain  Visit Diagnosis: Unsteadiness on feet  Other abnormalities of gait and  mobility  Muscle weakness (generalized)  Foot drop, right  Hemiplegia and hemiparesis following cerebral infarction affecting right dominant side [facility])     Problem List Patient Active Problem List   Diagnosis Date Noted  . Transaminitis   . PFO (patent foramen ovale) 11/22/2019  . Headache 11/22/2019  . Cerebral edema (HCC) 11/22/2019  . Chest pain 11/22/2019  . Hyperlipidemia LDL goal <70 11/22/2019  . OSA (obstructive sleep apnea) 11/22/2019  . 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 Vascular headache   . Dyslipidemia   . Hypertension   . Intractable nausea and vomiting   . Hypokalemia   . Depression with anxiety   . Ischemic stroke (HCC) - L PICA infarct embolic in setting of PFO s/p crani   . Lumbar spondylolysis 07/31/2019  . Lumbar radiculopathy 04/07/2019  . Right foot drop 04/07/2019  . Backache 07/05/2017  . DDD (degenerative disc disease), lumbar 07/05/2017  . Spinal stenosis of lumbar region 07/05/2017    Tempie Donning, PT, DPT 01/24/2020, 11:08 AM  [facility] [address] [place], [place], [address] Phone: [phone]   Fax:  [phone]  Name: [name] MRN: [id number] Date of Birth: Dec 07, 1970

## 2020-01-24 NOTE — Telephone Encounter (Signed)
Pt's wife called to speak with Dr. Earmon Phoenix nurse asking if the pt was supposed to fast before today's appt. Says that she would be the only person to know. Please call back

## 2020-01-25 LAB — CBC
Hematocrit: 43.6 % (ref 37.5–51.0)
Hemoglobin: 14.9 g/dL (ref 13.0–17.7)
MCH: 31.9 pg (ref 26.6–33.0)
MCHC: 34.2 g/dL (ref 31.5–35.7)
MCV: 93 fL (ref 79–97)
Platelets: 235 10*3/uL (ref 150–450)
RBC: 4.67 x10E6/uL (ref 4.14–5.80)
RDW: 13.6 % (ref 11.6–15.4)
WBC: 4.1 10*3/uL (ref 3.4–10.8)

## 2020-01-25 LAB — BASIC METABOLIC PANEL
BUN/Creatinine Ratio: 11 (ref 9–20)
BUN: 11 mg/dL (ref 6–24)
CO2: 25 mmol/L (ref 20–29)
Calcium: 10 mg/dL (ref 8.7–10.2)
Chloride: 104 mmol/L (ref 96–106)
Creatinine, Ser: 0.98 mg/dL (ref 0.76–1.27)
GFR calc Af Amer: 105 mL/min/{1.73_m2} (ref >59)
GFR calc non Af Amer: 91 mL/min/{1.73_m2} (ref >59)
Glucose: 93 mg/dL (ref 65–99)
Potassium: 4.5 mmol/L (ref 3.5–5.2)
Sodium: 143 mmol/L (ref 134–144)

## 2020-01-26 ENCOUNTER — Other Ambulatory Visit: Payer: Self-pay

## 2020-01-26 ENCOUNTER — Ambulatory Visit: Payer: 59

## 2020-01-26 DIAGNOSIS — R2689 Other abnormalities of gait and mobility: Secondary | ICD-10-CM | POA: Diagnosis not present

## 2020-01-26 DIAGNOSIS — R2681 Unsteadiness on feet: Secondary | ICD-10-CM

## 2020-01-26 DIAGNOSIS — M6281 Muscle weakness (generalized): Secondary | ICD-10-CM

## 2020-01-26 DIAGNOSIS — M21371 Foot drop, right foot: Secondary | ICD-10-CM

## 2020-01-26 DIAGNOSIS — I69318 Other symptoms and signs involving cognitive functions following cerebral infarction: Secondary | ICD-10-CM

## 2020-01-26 NOTE — Therapy (Signed)
[facility] [address] [place], [place], [address] Phone: [phone]   Fax:  [phone]  Physical Therapy Treatment  Patient Details  Name: [name] MRN: [id number] Date of Birth: 1970/12/04 Referring Provider (PT): Referred by: Lauraine Rinne, [name], being followed by: Alysia Penna, MD   Encounter Date: 01/26/2020   PT End of Session - 01/26/20 0940    Visit Number 9    Number of Visits 17    Date for PT Re-Evaluation 03/24/20   POC for 8 weeks, Cert for 90 days   Authorization Type [place] Time [phone]   pt arriving late   PT Stop Time 1014    PT Time Calculation (min) 40 min    Equipment Utilized During Treatment Gait belt    Activity Tolerance Patient tolerated treatment well;No increased pain    Behavior During Therapy WFL for tasks assessed/performed           Past Medical History:  Diagnosis Date  . Arthritis    neck  . Carpal tunnel syndrome on both sides 08/2014  . DDD (degenerative disc disease), lumbar   . History of kidney stones   . Hypertension    under control with med., has been on med. x 1 yr.  . Sleep apnea    CPAP but does not use  . Stroke [facility]) 11/16/2019    Past Surgical History:  Procedure Laterality Date  . BUBBLE STUDY  11/21/2019   Procedure: BUBBLE STUDY;  Surgeon: Buford Dresser, MD;  Location: [place];  Service: Cardiovascular;;  . CARPAL TUNNEL RELEASE Left 09/07/2014   Procedure: LEFT CARPAL TUNNEL RELEASE;  Surgeon: Roseanne Kaufman, MD;  Location: [place];  Service: Orthopedics;  Laterality: Left;  . CARPAL TUNNEL RELEASE Right 10/12/2014   Procedure: RIGHT CARPAL TUNNEL RELEASE;  Surgeon: Roseanne Kaufman, MD;  Location: [place];  Service: Orthopedics;  Laterality: Right;  . KNEE ARTHROSCOPY Left   . SUBOCCIPITAL CRANIECTOMY CERVICAL LAMINECTOMY N/A 11/17/2019   Procedure: SUBOCCIPITAL DECOMPRESSIVE  CRANIECTOMY;  Surgeon: Judith Part, MD;  Location: [place];  Service: Neurosurgery;  Laterality: N/A;  . TEE WITHOUT CARDIOVERSION N/A 11/21/2019   Procedure: TRANSESOPHAGEAL ECHOCARDIOGRAM (TEE);  Surgeon: Buford Dresser, MD;  Location: [facility] ENDOSCOPY;  Service: Cardiovascular;  Laterality: N/A;    There were no vitals filed for this visit.   Subjective Assessment - 01/26/20 0938    Subjective Patient reports that general pain all over has continued, with no energy. Went to MD yesterday that went well. No headache currently.    Patient is accompained by: Family member   Wife [name]   Pertinent History hypertension, hyperlipidemia, depression/anxiety, degenerative disc disease in lumbar region, obesity, sleep apnea    Limitations Walking;Standing    Patient Stated Goals Back to Normal, Do the things I did prior    Currently in Pain? Yes    Pain Score 7     Pain Location Generalized    Pain Orientation --   all over (generalized)   Pain Descriptors / Indicators Sore;Tender    Pain Onset More than a month ago                             [facility] Adult PT Treatment/Exercise - 01/26/20 0001      Transfers   Transfers Sit to Stand;Stand to Sit    Sit to Stand 5: Supervision  Five time sit to stand comments  17.72 secs from mat at standard chair height without UE support. increased control throughout.     Stand to Sit 5: Supervision      Ambulation/Gait   Ambulation/Gait Yes    Ambulation/Gait Assistance 5: Supervision;4: Min guard    Ambulation/Gait Assistance Details completed gait training with SPC with quad tip attachment. Overall supervision throughout, but patient did require CGA one instance due to imbalance. Patient demo improved gait pattern with SPC with quad tip attachment vs. small base quad cane.     Ambulation Distance (Feet) 575 Feet    Assistive device Straight cane   with quad tip attachment   Gait Pattern Step-through pattern;Decreased step  length - left;Decreased stance time - right;Decreased dorsiflexion - right;Right steppage;Wide base of support;Poor foot clearance - right    Ambulation Surface Level;Indoor    Gait Comments PT educating on purchase options for quad tip attachment.       Standardized Balance Assessment   Standardized Balance Assessment Timed Up and Go Test      Timed Up and Go Test   TUG Normal TUG    Normal TUG (seconds) 13   without AD, supervision     Self-Care   Self-Care Other Self-Care Comments    Other Self-Care Comments  PT spent time with patient working on cancelling appointments next week due to patient having surgery. Patient requested to cancel. PT educating on need for note from MD to allow for return to therapy services.                Balance Exercises - 01/26/20 0001      Balance Exercises: Standing   Stepping Strategy Anterior;Posterior;Foam/compliant surface;Limitations    Stepping Strategy Limitations completed stepping strategy standing on blue balance beam, completed x 10 reps alternating posterior steppign strategy and x 10 reps anterior stepping strategy. Increased balance challenge noted with posterior. Intermittent UE support required for completion.     Balance Beam standing across blue balance beam completed static standing with eyes open 2 x 1 minute each. Progresed to compelting horizontal/vertical head turns 1 x 15 reps each. Increased difficulty noted with addition of head turns requiring intermittent CGA and UE support from // bars.                PT Short Term Goals - 01/26/20 0954      PT SHORT TERM GOAL #1   Title patient will be independent with initial HEP for strengthening/balance (All STG's Due: 01/22/20)    Baseline patient reports independence with HEP    Time 4    Period Weeks    Status Achieved    Target Date 01/22/20      PT SHORT TERM GOAL #2   Title Patient will demo ability to ambulate >500 ft on indoor level surfaces with LRAD and  supervision to demo improved household mobility    Baseline 575 indoor, supervision with SPC with quad tip attachment one instance of CGA    Time 4    Period Weeks    Status Partially Met      PT SHORT TERM GOAL #3   Title Patient will improve TUG score w/ LRAD to </= 25 seconds to demo improved functional mobility    Baseline 35.84 secs, 13.00 secs w/o AD    Time 4    Period Weeks    Status Achieved      PT SHORT TERM GOAL #4   Title Patient will  improve 5x sit <> stand to </= 30 seconds to demonstrate improved functional strength    Baseline 49.56 secs w/ UE support, 17.76 secs w/o UE support    Time 4    Period Weeks    Status Achieved      PT SHORT TERM GOAL #5   Title Berg Balance to be assessed and LTG to be set as appropriate    Baseline baseline established LTG set.    Time 4    Period Weeks    Status Achieved             PT Long Term Goals - 01/01/20 1255      PT LONG TERM GOAL #1   Title Patient will be independent with final HEP for strengthening/balance (All LTG's due: 02/19/20)    Baseline HEP established    Time 8    Period Weeks    Status New      PT LONG TERM GOAL #2   Title Patient will demonstrate ability to ambulate >1000 on indoor/outdoor surfaces with LRAD and supervision for improved community mobility    Baseline 100 ft CGA    Time 8    Period Weeks    Status New      PT LONG TERM GOAL #3   Title Patient will improve gait speed to >/= 2.0 ft/sec  with LRAD to demonstrate improved community ambulation    Baseline 0.69 ft/sec    Time 8    Period Weeks    Status New      PT LONG TERM GOAL #4   Title Patient will improve 5x sit <> stand to </= 20 secs to demonstrate improved functional mobility    Baseline 49.56 secs    Time 8    Period Weeks    Status New      PT LONG TERM GOAL #5   Title Patient will improve Tug to </= 15 seconds with LRAD to demo improved mobility and reduced fall risk    Baseline 35.84 secs    Time 8    Period  Weeks    Status New      PT LONG TERM GOAL #6   Title Patient will improve Berg Balance to >/= 45/56 to demonstrate reduced fall risk and improved balance    Baseline 39/56    Time 8    Period Weeks    Status New                 Plan - 01/26/20 1113    Clinical Impression Statement Today's skilled PT session included assessment of patient's progress toward STG. Patient able to meet STGs today, with partially meeting STG #2 as patient required one instnace of CGA during ambulation. Patient demo significant improves in 5x sit<>stand and TUG times demonstrating improved mobility and reduced fall risk. patient will continue to benefit from skilled PT services to progress toward all goals.    Personal Factors and Comorbidities Comorbidity 3+    Comorbidities hypertension, hyperlipidemia, depression/anxiety, degenerative disc disease in lumbar region, obesity, sleep apnea    Examination-Activity Limitations Bend;Transfers;Stairs;Stand;Lift    Examination-Participation Restrictions Occupation;Yard Work;Community Activity;Driving    Stability/Clinical Decision Making Evolving/Moderate complexity    Rehab Potential Good    PT Frequency 2x / week    PT Duration 8 weeks    PT Treatment/Interventions ADLs/Self Care Home Management;Aquatic Therapy;Cryotherapy;Electrical Stimulation;Moist Heat;DME Instruction;Gait training;Stair training;Functional mobility training;Therapeutic activities;Therapeutic exercise;Balance training;Neuromuscular re-education;Patient/family education;Orthotic Fit/Training;Manual techniques;Passive range of motion;Vestibular    PT  Next Visit Plan Did we get new cane? Continue gait without AD. Functional [place] (standing), Balance Exercises. Slowly incorporating vision removed, retropulsion noted with EC.    Consulted and Agree with Plan of Care Patient;Family member/caregiver    Family Member Consulted Wife           Patient will benefit from skilled  therapeutic intervention in order to improve the following deficits and impairments:  Abnormal gait, Decreased balance, Decreased endurance, Difficulty walking, Impaired sensation, Dizziness, Decreased range of motion, Decreased activity tolerance, Decreased knowledge of use of DME, Decreased strength, Pain  Visit Diagnosis: Unsteadiness on feet  Other symptoms and signs involving cognitive functions following cerebral infarction  Muscle weakness (generalized)  Other abnormalities of gait and mobility  Foot drop, right     Problem List Patient Active Problem List   Diagnosis Date Noted  . Transaminitis   . PFO (patent foramen ovale) 11/22/2019  . Headache 11/22/2019  . Cerebral edema ([place]) 11/22/2019  . Chest pain 11/22/2019  . Hyperlipidemia LDL goal <70 11/22/2019  . OSA (obstructive sleep apnea) 11/22/2019  . 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 ([place]) 11/22/2019  . Vascular headache   . Dyslipidemia   . Hypertension   . Intractable nausea and vomiting   . Hypokalemia   . Depression with anxiety   . Ischemic stroke ([place]) - L PICA infarct embolic in setting of PFO s/p crani   . Lumbar spondylolysis 07/31/2019  . Lumbar radiculopathy 04/07/2019  . Right foot drop 04/07/2019  . Backache 07/05/2017  . DDD (degenerative disc disease), lumbar 07/05/2017  . Spinal stenosis of lumbar region 07/05/2017    Jones Bales, PT, DPT 01/26/2020, 11:16 AM  [facility] [address] [place], [place], [address] Phone: [phone]   Fax:  [phone]  Name: KIELAN DREISBACH MRN: [id number] Date of Birth: Jan 25, 1971

## 2020-01-29 ENCOUNTER — Other Ambulatory Visit (HOSPITAL_COMMUNITY)
Admission: RE | Admit: 2020-01-29 | Discharge: 2020-01-29 | Disposition: A | Discharge status: Home / Self Care | Payer: 59 | Source: Ambulatory Visit | Attending: Cardiovascular Disease | Admitting: Cardiovascular Disease

## 2020-01-29 ENCOUNTER — Other Ambulatory Visit: Payer: 59

## 2020-01-29 DIAGNOSIS — Z01812 Encounter for preprocedural laboratory examination: Secondary | ICD-10-CM | POA: Insufficient documentation

## 2020-01-29 DIAGNOSIS — Z20822 Contact with and (suspected) exposure to covid-19: Secondary | ICD-10-CM | POA: Diagnosis not present

## 2020-01-29 LAB — SARS CORONAVIRUS 2 (TAT 6-24 HRS): SARS Coronavirus 2: NEGATIVE

## 2020-01-30 ENCOUNTER — Encounter: Payer: 59 | Admitting: Occupational Therapy

## 2020-01-30 ENCOUNTER — Telehealth: Payer: Self-pay | Admitting: *Deleted

## 2020-01-30 ENCOUNTER — Ambulatory Visit: Payer: 59

## 2020-01-30 NOTE — Telephone Encounter (Signed)
Pt contacted pre-PFO closure scheduled at [facility] for: Wednesday January 31, 2020 8:30 AM Verified arrival time and place: [facility] Main Entrance A [facility]) at: 6:30 AM   No solid food after midnight prior to cath, clear liquids until 5 AM day of procedure.   AM meds can be  taken pre-cath with sips of water including: ASA 81 mg Plavix 75 mg   Confirmed patient has responsible adult to drive home post procedure and be with patient first 24 hours after arriving home: yes  You are allowed ONE visitor in the waiting room during the time you are at the hospital for your procedure. Both you and your visitor must wear a mask once you enter the hospital.       COVID-19 Pre-Screening Questions:  . In the past 10 days have you had a new cough, shortness of breath, headache, congestion, fever (100 or greater) unexplained body aches, new sore throat, or sudden loss of taste or sense of smell? no . In the past 10 days have you been around anyone with known Covid 19? no . Have you been vaccinated for COVID-19? no   Reviewed procedure/mask/visitor instructions, COVID-19 questions with patient.

## 2020-01-31 ENCOUNTER — Ambulatory Visit (HOSPITAL_COMMUNITY)
Admission: RE | Admit: 2020-01-31 | Discharge: 2020-01-31 | Disposition: A | Discharge status: Home / Self Care | Payer: 59 | Attending: Cardiovascular Disease | Admitting: Cardiovascular Disease

## 2020-01-31 ENCOUNTER — Ambulatory Visit (HOSPITAL_BASED_OUTPATIENT_CLINIC_OR_DEPARTMENT_OTHER): Payer: 59

## 2020-01-31 ENCOUNTER — Other Ambulatory Visit (HOSPITAL_COMMUNITY): Payer: 59

## 2020-01-31 ENCOUNTER — Other Ambulatory Visit: Payer: Self-pay

## 2020-01-31 ENCOUNTER — Ambulatory Visit (HOSPITAL_COMMUNITY)
Admission: RE | Disposition: A | Discharge status: Home / Self Care | Payer: 59 | Source: Home / Self Care | Attending: Cardiovascular Disease

## 2020-01-31 DIAGNOSIS — I10 Essential (primary) hypertension: Secondary | ICD-10-CM | POA: Diagnosis not present

## 2020-01-31 DIAGNOSIS — Q211 Atrial septal defect: Secondary | ICD-10-CM

## 2020-01-31 DIAGNOSIS — Z8673 Personal history of transient ischemic attack (TIA), and cerebral infarction without residual deficits: Secondary | ICD-10-CM | POA: Diagnosis not present

## 2020-01-31 DIAGNOSIS — G4730 Sleep apnea, unspecified: Secondary | ICD-10-CM | POA: Diagnosis not present

## 2020-01-31 DIAGNOSIS — Z7982 Long term (current) use of aspirin: Secondary | ICD-10-CM | POA: Diagnosis not present

## 2020-01-31 DIAGNOSIS — Q2112 Patent foramen ovale: Secondary | ICD-10-CM

## 2020-01-31 DIAGNOSIS — Z79899 Other long term (current) drug therapy: Secondary | ICD-10-CM | POA: Insufficient documentation

## 2020-01-31 HISTORY — PX: PATENT FORAMEN OVALE(PFO) CLOSURE: CATH118300

## 2020-01-31 LAB — ECHOCARDIOGRAM LIMITED
Height: 75 in
S' Lateral: 4.1 cm
Weight: 4960 oz

## 2020-01-31 LAB — POCT ACTIVATED CLOTTING TIME
Activated Clotting Time: 246 seconds
Activated Clotting Time: 158 seconds

## 2020-01-31 SURGERY — PATENT FORAMEN OVALE (PFO) CLOSURE
Anesthesia: LOCAL

## 2020-01-31 MED ORDER — MORPHINE SULFATE (PF) 4 MG/ML IV SOLN: 2.0000 mg | INTRAVENOUS | Status: DC | PRN

## 2020-01-31 MED ORDER — ACETAMINOPHEN 325 MG PO TABS: 650.0000 mg | ORAL_TABLET | ORAL | Status: DC | PRN

## 2020-01-31 MED ORDER — SODIUM CHLORIDE 0.9 % IV SOLN: 250.0000 mL | INTRAVENOUS | Status: DC | PRN

## 2020-01-31 MED ORDER — MIDAZOLAM HCL 2 MG/2ML IJ SOLN
INTRAMUSCULAR | Status: AC
  Filled 2020-01-31: qty 2

## 2020-01-31 MED ORDER — SODIUM CHLORIDE 0.9 % WEIGHT BASED INFUSION
3.0000 mL/kg/h | INTRAVENOUS | Status: AC
  Administered 2020-01-31: 3 mL/kg/h via INTRAVENOUS

## 2020-01-31 MED ORDER — HEPARIN SODIUM (PORCINE) 1000 UNIT/ML IJ SOLN
INTRAMUSCULAR | Status: AC
  Filled 2020-01-31: qty 1

## 2020-01-31 MED ORDER — SODIUM CHLORIDE 0.9% FLUSH: 3.0000 mL | Freq: Two times a day (BID) | INTRAVENOUS | Status: DC

## 2020-01-31 MED ORDER — LIDOCAINE HCL (PF) 1 % IJ SOLN
INTRAMUSCULAR | Status: AC
  Filled 2020-01-31: qty 30

## 2020-01-31 MED ORDER — HEPARIN (PORCINE) IN NACL 1000-0.9 UT/500ML-% IV SOLN
INTRAVENOUS | Status: AC
  Filled 2020-01-31: qty 1000

## 2020-01-31 MED ORDER — SODIUM CHLORIDE 0.9% FLUSH: 3.0000 mL | INTRAVENOUS | Status: DC | PRN

## 2020-01-31 MED ORDER — LIDOCAINE HCL (PF) 1 % IJ SOLN
INTRAMUSCULAR | Status: DC | PRN
  Administered 2020-01-31: 15 mL

## 2020-01-31 MED ORDER — FENTANYL CITRATE (PF) 100 MCG/2ML IJ SOLN
INTRAMUSCULAR | Status: AC
  Filled 2020-01-31: qty 2

## 2020-01-31 MED ORDER — SODIUM CHLORIDE 0.9 % WEIGHT BASED INFUSION: 1.0000 mL/kg/h | INTRAVENOUS | Status: DC

## 2020-01-31 MED ORDER — DEXTROSE 5 % IV SOLN
3.0000 g | INTRAVENOUS | Status: AC
  Administered 2020-01-31: 3 g via INTRAVENOUS
  Filled 2020-01-31: qty 3

## 2020-01-31 MED ORDER — HEPARIN SODIUM (PORCINE) 1000 UNIT/ML IJ SOLN
INTRAMUSCULAR | Status: DC | PRN
  Administered 2020-01-31: 10000 [IU] via INTRAVENOUS

## 2020-01-31 MED ORDER — FENTANYL CITRATE (PF) 100 MCG/2ML IJ SOLN
INTRAMUSCULAR | Status: DC | PRN
Start: 2020-01-31 — End: 2020-01-31
  Administered 2020-01-31 (×2): 50 ug via INTRAVENOUS

## 2020-01-31 MED ORDER — ASPIRIN 81 MG PO CHEW: 81.0000 mg | CHEWABLE_TABLET | ORAL | Status: DC

## 2020-01-31 MED ORDER — ONDANSETRON HCL 4 MG/2ML IJ SOLN: 4.0000 mg | Freq: Four times a day (QID) | INTRAMUSCULAR | Status: DC | PRN

## 2020-01-31 MED ORDER — MIDAZOLAM HCL 2 MG/2ML IJ SOLN
INTRAMUSCULAR | Status: DC | PRN
  Administered 2020-01-31 (×2): 2 mg via INTRAVENOUS

## 2020-01-31 MED ORDER — CLOPIDOGREL BISULFATE 75 MG PO TABS: 75.0000 mg | ORAL_TABLET | ORAL | Status: DC

## 2020-01-31 MED ORDER — LABETALOL HCL 5 MG/ML IV SOLN: 10.0000 mg | INTRAVENOUS | Status: DC | PRN

## 2020-01-31 MED ORDER — HYDRALAZINE HCL 20 MG/ML IJ SOLN: 10.0000 mg | INTRAMUSCULAR | Status: DC | PRN

## 2020-01-31 MED ORDER — HEPARIN (PORCINE) IN NACL 1000-0.9 UT/500ML-% IV SOLN
INTRAVENOUS | Status: DC | PRN
  Administered 2020-01-31: 500 mL

## 2020-01-31 SURGICAL SUPPLY — 14 items
CATH ACUNAV 8FR 90CM (CATHETERS) ×2 IMPLANT
CATH INFINITI 6F MPA2 100CM (CATHETERS) ×2 IMPLANT
COVER SWIFTLINK CONNECTOR (BAG) ×2 IMPLANT
GUIDEWIRE AMPLATZER 1.5JX260 (WIRE) ×2 IMPLANT
OCCLUDER AMPLATZER PFO 25MM (Prosthesis & Implant Heart) ×2 IMPLANT
PACK CARDIAC CATHETERIZATION (CUSTOM PROCEDURE TRAY) ×2 IMPLANT
PROTECTION STATION PRESSURIZED (MISCELLANEOUS) ×4
SHEATH PINNACLE 8F 10CM (SHEATH) ×2 IMPLANT
SHEATH PINNACLE 9F 10CM (SHEATH) ×2 IMPLANT
SHEATH PROBE COVER 6X72 (BAG) ×2 IMPLANT
STATION PROTECTION PRESSURIZED (MISCELLANEOUS) ×2 IMPLANT
SYS DELIVER AMP TREVISIO 8FR (SHEATH) ×2
SYSTEM DELIVER AMP TREVIS 8FR (SHEATH) ×1 IMPLANT
WIRE EMERALD 3MM-J .035X150CM (WIRE) ×2 IMPLANT

## 2020-01-31 NOTE — Discharge Instructions (Signed)
Patent Foramen Ovale Closure, Adult, Care After This sheet gives you information about how to care for yourself after your procedure. Your health care provider may also give you more specific instructions. If you have problems or questions, contact your health care provider. What can I expect after the procedure? After the procedure, it is common to have:  Bruising.  Tenderness in the area where the long, thin tube was inserted into your inner thigh or groin area (catheter insertion site). Follow these instructions at home: Catheter insertion site care   Follow instructions from your health care provider about how to take care of your incision or puncture. Make sure you: ? Wash your hands with soap and water before you change your bandage (dressing). If soap and water are not available, use hand sanitizer. ? Change your dressing as told by your health care provider. ? Leave stitches (sutures), skin glue, or adhesive strips in place. These skin closures may need to stay in place for 2 weeks or longer. If adhesive strip edges start to loosen and curl up, you may trim the loose edges. Do not remove adhesive strips completely unless your health care provider tells you to do that.  Check your catheter insertion site every day for signs of infection. Check for: ? Redness, swelling, or pain. ? Fluid or blood. ? Warmth. ? Pus or a bad smell. ? A lump or bump. Activity  Do not lift anything that is heavier than 10 lb (4.5 kg), or the limit that your health care provider tells you, until he or she says that it is safe.  Return to your normal activities as told by your health care provider. Ask your health care provider what activities are safe for you.  Do not drive until your health care provider approves. Medicines  Take over-the-counter and prescription medicines only as told by your health care provider.  You may need to take medicines to prevent blood clots for 6 months or longer. You  may have to take aspirin and clopidogrel. Clopidogrel is a blood thinner (anticoagulant) that helps to prevent heart attacks and strokes. Lifestyle  Avoid drinking alcohol.  Do not use any products that contain nicotine or tobacco, such as cigarettes and e-cigarettes. If you need help quitting, ask your health care provider. General instructions  Drink enough fluid to keep your urine clear or pale yellow. This helps to get rid of the dye that was used during the procedure.  Tell all health care providers and dental care providers who care for you that you had a patent foramen ovale closure. Do this before having any type of test or surgery.  Ask your health care provider if you need to take antibiotic medicine before dental procedures and surgeries. This may be necessary to prevent infection.  While taking anticoagulants: ? Prevent falls by removing loose rugs and extension cords from areas where you walk. ? Be very careful when using knives, scissors, or other sharp objects. ? Do not play contact sports or participate in other activities that have a high risk of injury.  Do not take baths, swim, or use a hot tub until your health care provider approves.  Keep all follow-up visits as told by your health care provider. This is important. Contact a health care provider if:  You have a fever.  You have pain that does not get better with medicine.  You have fluid or blood coming from your catheter insertion site.  You have a hard lump or  bump at your catheter insertion site. Get help right away if:  You have trouble breathing.  You have chest pain.  You have redness, swelling, or pain around your catheter insertion site.  Your catheter insertion site feels warm to the touch.  You have pus or a bad smell coming from your catheter insertion site.  You have severe pain in your arm or jaw. Summary  After the procedure, it is common for you to have some bruising and tenderness  where a tube was inserted into your inner thigh or groin area (catheter insertion site).  Check your catheter insertion site every day for signs of infection, such as redness, swelling, or pain.  Before any procedure or test, tell all health care providers and dental providers who care for you that you had a patent foramen ovale closure. This information is not intended to replace advice given to you by your health care provider. Make sure you discuss any questions you have with your health care provider. Document Revised: 08/30/2018 Document Reviewed: 06/04/2016 Elsevier Patient Education  2020 [employer].

## 2020-01-31 NOTE — Interval H&P Note (Signed)
History and Physical Interval Note:  01/31/2020 7:50 AM  [name]  has presented today for surgery, with the diagnosis of PFO.  The various methods of treatment have been discussed with the patient and family. After consideration of risks, benefits and other options for treatment, the patient has consented to  Procedure(s): PATENT FORAMEN OVALE (PFO) CLOSURE (N/A) as a surgical intervention.  The patient's history has been reviewed, patient examined, no change in status, stable for surgery.  I have reviewed the patient's chart and labs.  Questions were answered to the patient's satisfaction.     [name]

## 2020-01-31 NOTE — Progress Notes (Signed)
  Echocardiogram 2D Echocardiogram has been performed.  [name] 01/31/2020, 1:12 PM

## 2020-01-31 NOTE — Progress Notes (Signed)
Site area: Right groin a 8 and 9 french venous sheath was removed  Site Prior to Removal:  Level 0  Pressure Applied For 25 MINUTES    Bedrest Beginning at 1110am  Manual:   Yes.    Patient Status During Pull:  stable  Post Pull Groin Site:  Level 0  Post Pull Instructions Given:  Yes.    Post Pull Pulses Present:  Yes.    Dressing Applied:  Yes.    Comments:

## 2020-02-01 ENCOUNTER — Encounter: Payer: 59 | Admitting: Occupational Therapy

## 2020-02-01 ENCOUNTER — Ambulatory Visit: Payer: 59

## 2020-02-01 ENCOUNTER — Encounter (HOSPITAL_COMMUNITY): Payer: Self-pay | Admitting: Cardiovascular Disease

## 2020-02-01 MED FILL — Heparin Sod (Porcine)-NaCl IV Soln 1000 Unit/500ML-0.9%: INTRAVENOUS | Qty: 500 | Status: AC

## 2020-02-05 ENCOUNTER — Ambulatory Visit: Payer: 59 | Admitting: Occupational Therapy

## 2020-02-05 ENCOUNTER — Ambulatory Visit: Payer: 59

## 2020-02-07 ENCOUNTER — Inpatient Hospital Stay: Payer: 59 | Admitting: Neurology

## 2020-02-08 ENCOUNTER — Ambulatory Visit: Payer: 59

## 2020-02-08 ENCOUNTER — Encounter: Payer: 59 | Admitting: Occupational Therapy

## 2020-02-12 ENCOUNTER — Ambulatory Visit: Payer: 59 | Admitting: Occupational Therapy

## 2020-02-12 ENCOUNTER — Ambulatory Visit: Payer: 59

## 2020-02-12 ENCOUNTER — Other Ambulatory Visit: Payer: Self-pay

## 2020-02-12 DIAGNOSIS — I69318 Other symptoms and signs involving cognitive functions following cerebral infarction: Secondary | ICD-10-CM

## 2020-02-12 DIAGNOSIS — R2681 Unsteadiness on feet: Secondary | ICD-10-CM

## 2020-02-12 DIAGNOSIS — M6281 Muscle weakness (generalized): Secondary | ICD-10-CM

## 2020-02-12 DIAGNOSIS — R2689 Other abnormalities of gait and mobility: Secondary | ICD-10-CM | POA: Diagnosis not present

## 2020-02-12 NOTE — Patient Instructions (Signed)
Gaze Stabilization: Standing Feet Together    Feet together, keeping eyes on target on wall 5  feet away, tilt head down 15-30 and move head side to side for 60 seconds. Repeat while moving head up and down for 60 seconds. Do 2-3 sessions per day. Repeat using target on pattern background.  Copyright  VHI. All rights reserved.

## 2020-02-12 NOTE — Therapy (Signed)
[facility] [address] [place], [place], [address] Phone: [phone]   Fax:  [phone]  Physical Therapy Treatment  Patient Details  Name: [name] MRN: [id number] Date of Birth: 11-Jun-1970 Referring Provider (PT): [name], [name], [name], MD   Encounter Date: 02/12/2020   PT End of Session - 02/12/20 1019    Visit Number 10    Number of Visits 17    Date for PT Re-Evaluation 03/24/20   POC for 8 weeks, Cert for 90 days   Authorization Type [employer]    PT Start Time 1017    PT Stop Time 1058    PT Time Calculation (min) 41 min    Equipment Utilized During Treatment Gait belt    Activity Tolerance Patient tolerated treatment well;No increased pain    Behavior During Therapy WFL for tasks assessed/performed           Past Medical History:  Diagnosis Date  . Arthritis    neck  . Carpal tunnel syndrome on both sides 08/2014  . DDD (degenerative disc disease), lumbar   . History of kidney stones   . Hypertension    under control with med., has been on med. x 1 yr.  . Sleep apnea    CPAP but does not use  . Stroke [facility]) 11/16/2019    Past Surgical History:  Procedure Laterality Date  . BUBBLE STUDY  11/21/2019   Procedure: BUBBLE STUDY;  Surgeon: [name], MD;  [facility] END[facility];  Service: Cardiovascular;;  . CARPAL TUNNEL RELEASE Left 09/07/2014   Procedure: LEFT CARPAL TUNNEL RELEASE;  Surgeon: [name], MD;  Location: [facility];  Service: Orthopedics;  Laterality: Left;  . CARPAL TUNNEL RELEASE Right 10/12/2014   Procedure: RIGHT CARPAL TUNNEL RELEASE;  Surgeon: [name], MD;  Location: [facility];  Service: Orthopedics;  Laterality: Right;  . KNEE ARTHROSCOPY Left   . PATENT FORAMEN OVALE(PFO) CLOSURE N/A 01/31/2020   Procedure: PATENT FORAMEN OVALE (PFO) CLOSURE;  Surgeon: [name], MD;  Location: [facility] INVASIVE CV LAB;  Service: Cardiovascular;  Laterality: N/A;  . SUBOCCIPITAL CRANIECTOMY CERVICAL LAMINECTOMY N/A 11/17/2019   Procedure: SUBOCCIPITAL DECOMPRESSIVE CRANIECTOMY;  Surgeon: [name], MD;  [facility] OR;  Service: Neurosurgery;  Laterality: N/A;  . TEE WITHOUT CARDIOVERSION N/A 11/21/2019   Procedure: TRANSESOPHAGEAL ECHOCARDIOGRAM (TEE);  Surgeon: [name], MD;  Location: [facility] ENDOSCOPY;  Service: Cardiovascular;  Laterality: N/A;    There were no vitals filed for this visit.   Subjective Assessment - 02/12/20 1018    Subjective Patient reports that surgent went well. Reports that he is in pain this morning because he has been passing a kidney stone. Patient continues to report general fatigue. Patient reports has only been using cane on unlevel terrain. Patient reports that he did go to flea market, and was overstimulated challenging the balance.    Patient is accompained by: Family member   Wife [name]   Pertinent History hypertension, hyperlipidemia, depression/anxiety, degenerative disc disease in lumbar region, obesity, sleep apnea    Limitations Walking;Standing    Patient Stated Goals Back to Normal, Do the things I did prior    Currently in Pain? Yes    Pain Score 6     Pain Location Generalized    Pain Orientation Other (Comment)   Generalized   Pain Descriptors / Indicators Other (Comment)   Fatigue   Pain Onset  More than a month ago                             [name] Adult PT Treatment/Exercise - 02/12/20 0001      Ambulation/Gait   Ambulation/Gait Yes    Ambulation/Gait Assistance 5: Supervision    Ambulation/Gait Assistance Details completed gait training without AD over level surfaces. Patient demo improved step length overall and gait speed with ambulation    Ambulation Distance (Feet) 600 Feet    Assistive device None    Gait Pattern Step-through pattern;Decreased step length -  left;Decreased stance time - right;Decreased dorsiflexion - right;Right steppage;Wide base of support;Poor foot clearance - right    Ambulation Surface Level;Indoor      High Level Balance   High Level Balance Activities Backward walking;Head turns    High Level Balance Comments In hallway: completed the following horizontal/vertical head turns 2 x 45' each, followed by backwards walking 4 x 45'. CGA required iwth backwards walking, able to maintian balance without UE support.            Vestibular Treatment/Exercise - 02/12/20 0001      Vestibular Treatment/Exercise   Vestibular Treatment Provided Gaze    Gaze Exercises X1 Viewing Horizontal;X2 Viewing Horizontal      X1 Viewing Horizontal   Foot Position standing feet together    Reps 2    Comments 60 seconds. mild sway noted with completion      X2 Viewing Horizontal   Foot Position standing feet apart    Reps 2     Comments x 45 secs each. increased coordination challenge, no dizziness noted.               Balance Exercises - 02/12/20 0001      Balance Exercises: Standing   Standing Eyes Opened Narrow base of support (BOS);Head turns;Foam/compliant surface;Limitations    Standing Eyes Opened Limitations 2 x 10 reps of horizontal/vertical head turns    Standing Eyes Closed Foam/compliant surface;3 reps;Time;Limitations;Narrow base of support (BOS)    Standing Eyes Closed Time completed eyes closed, 3 x 15-25 seconds    Standing Eyes Closed Limitations increased sway noted requiring UE support from // bars    Rockerboard Anterior/posterior;Lateral;EO;EC;Intermittent UE support;Limitations    Rockerboard Limitations completed standing on rockerboard holding steady with eyes open, then progressed to closing eyes, patient able to hold 15 seconds and demo increased sway/truncal ataxia. Progressed to board positioned lateral, worked on maintaining steady x 2 minutes. Then progressed to completing lateral weight shift to R/L x 10  reps. PT requiring verbal cues for improved weight shift with completion. Completed without UE suppotr, patient demo one instance of LOB to L side.    Other Standing Exercises standing on airex pad, completed alternating toe taps to cones forward, completed 2 x 15 reps. completed without UE support, requiring increased CGA at times and UE support. On firm surface in // bars: completing alternating marching without UE support with eyes open, then progressed to completing alternating marching x 10 reps with eyes closed. Increased difficulty noted with patient reports difficulty to feel where he is oriented in space.              PT Education - 02/12/20 1059    Education Details HEP update (gaze stabilization; see patient instructions)    Person(s) Educated Patient    Methods Explanation;Demonstration;Handout    Comprehension Verbalized understanding;Returned demonstration  PT Short Term Goals - 01/26/20 0954      PT SHORT TERM GOAL #1   Title patient will be independent with initial HEP for strengthening/balance (All STG's Due: 01/22/20)    Baseline patient reports independence with HEP    Time 4    Period Weeks    Status Achieved    Target Date 01/22/20      PT SHORT TERM GOAL #2   Title Patient will demo ability to ambulate >500 ft on indoor level surfaces with LRAD and supervision to demo improved household mobility    Baseline 575 indoor, supervision with SPC with quad tip attachment one instance of CGA    Time 4    Period Weeks    Status Partially Met      PT SHORT TERM GOAL #3   Title Patient will improve TUG score w/ LRAD to </= 25 seconds to demo improved functional mobility    Baseline 35.84 secs, 13.00 secs w/o AD    Time 4    Period Weeks    Status Achieved      PT SHORT TERM GOAL #4   Title Patient will improve 5x sit <> stand to </= 30 seconds to demonstrate improved functional strength    Baseline 49.56 secs w/ UE support, 17.76 secs w/o UE support     Time 4    Period Weeks    Status Achieved      PT SHORT TERM GOAL #5   Title Berg Balance to be assessed and LTG to be set as appropriate    Baseline baseline established LTG set.    Time 4    Period Weeks    Status Achieved             PT Long Term Goals - 01/01/20 1255      PT LONG TERM GOAL #1   Title Patient will be independent with final HEP for strengthening/balance (All LTG's due: 02/19/20)    Baseline HEP established    Time 8    Period Weeks    Status New      PT LONG TERM GOAL #2   Title Patient will demonstrate ability to ambulate >1000 on indoor/outdoor surfaces with LRAD and supervision for improved community mobility    Baseline 100 ft CGA    Time 8    Period Weeks    Status New      PT LONG TERM GOAL #3   Title Patient will improve gait speed to >/= 2.0 ft/sec  with LRAD to demonstrate improved community ambulation    Baseline 0.69 ft/sec    Time 8    Period Weeks    Status New      PT LONG TERM GOAL #4   Title Patient will improve 5x sit <> stand to </= 20 secs to demonstrate improved functional mobility    Baseline 49.56 secs    Time 8    Period Weeks    Status New      PT LONG TERM GOAL #5   Title Patient will improve Tug to </= 15 seconds with LRAD to demo improved mobility and reduced fall risk    Baseline 35.84 secs    Time 8    Period Weeks    Status New      PT LONG TERM GOAL #6   Title Patient will improve Berg Balance to >/= 45/56 to demonstrate reduced fall risk and improved balance    Baseline 39/56  Time 8    Period Weeks    Status New                 Plan - 02/12/20 1157    Clinical Impression Statement Today's skilled PT session included continued gait training without AD and progressing balance exercises as tolerated. Patient continue to demo increased challenge with vision removed and on unsteady surfaces. Progressed VOR exercises as tolerated by patient. Will continue to progress as tolerated by patient.     Personal Factors and Comorbidities Comorbidity 3+    Comorbidities hypertension, hyperlipidemia, depression/anxiety, degenerative disc disease in lumbar region, obesity, sleep apnea    Examination-Activity Limitations Bend;Transfers;Stairs;Stand;Lift    Examination-Participation Restrictions Occupation;Yard Work;Community Activity;Driving    Stability/Clinical Decision Making Evolving/Moderate complexity    Rehab Potential Good    PT Frequency 2x / week    PT Duration 8 weeks    PT Treatment/Interventions ADLs/Self Care Home Management;Aquatic Therapy;Cryotherapy;Electrical Stimulation;Moist Heat;DME Instruction;Gait training;Stair training;Functional mobility training;Therapeutic activities;Therapeutic exercise;Balance training;Neuromuscular re-education;Patient/family education;Orthotic Fit/Training;Manual techniques;Passive range of motion;Vestibular    PT Next Visit Plan Continue gait without AD. Functional [place] (standing), Balance Exercises. Slowly incorporating vision removed, retropulsion noted with EC.    Consulted and Agree with Plan of Care Patient;Family member/caregiver    Family Member Consulted Wife           Patient will benefit from skilled therapeutic intervention in order to improve the following deficits and impairments:  Abnormal gait, Decreased balance, Decreased endurance, Difficulty walking, Impaired sensation, Dizziness, Decreased range of motion, Decreased activity tolerance, Decreased knowledge of use of DME, Decreased strength, Pain  Visit Diagnosis: Unsteadiness on feet  Other symptoms and signs involving cognitive functions following cerebral infarction  Muscle weakness (generalized)  Other abnormalities of gait and mobility     Problem List Patient Active Problem List   Diagnosis Date Noted  . Transaminitis   . PFO with atrial septal aneurysm 11/22/2019  . Headache 11/22/2019  . Cerebral edema ([place]) 11/22/2019  . Chest pain 11/22/2019  .  Hyperlipidemia LDL goal <70 11/22/2019  . OSA (obstructive sleep apnea) 11/22/2019  . 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 Vascular headache   . Dyslipidemia   . Hypertension   . Intractable nausea and vomiting   . Hypokalemia   . Depression with anxiety   . Ischemic stroke ([place]) - L PICA infarct embolic in setting of PFO s/p crani   . Lumbar spondylolysis 07/31/2019  . Lumbar radiculopathy 04/07/2019  . Right foot drop 04/07/2019  . Backache 07/05/2017  . DDD (degenerative disc disease), lumbar 07/05/2017  . Spinal stenosis of lumbar region 07/05/2017    Jones Bales, PT, DPT 02/12/2020, 12:12 PM  [facility] [address] [place], [place], [address] Phone: [phone]   Fax:  [phone]  Name: AKSHATH MCCAREY MRN: [id number] Date of Birth: 08-30-1970

## 2020-02-12 NOTE — Therapy (Signed)
[facility] [address] [place], [place], [address] Phone: [phone]   Fax:  [phone]  Occupational Therapy Treatment  Patient Details  Name: [name] MRN: [id number] Date of Birth: 1970/12/23 No data recorded  Encounter Date: 02/12/2020   OT End of Session - 02/12/20 1010    Visit Number 5    Number of Visits 9    Authorization Type [employer] - 60 visit combined PT/OT/ST limit (hard max)    OT Start Time 0930    OT Stop Time 1015    OT Time Calculation (min) 45 min    Activity Tolerance Patient tolerated treatment well    Behavior During Therapy [facility] for tasks assessed/performed           Past Medical History:  Diagnosis Date  . Arthritis    neck  . Carpal tunnel syndrome on both sides 08/2014  . DDD (degenerative disc disease), lumbar   . History of kidney stones   . Hypertension    under control with med., has been on med. x 1 yr.  . Sleep apnea    CPAP but does not use  . Stroke [facility]) 11/16/2019    Past Surgical History:  Procedure Laterality Date  . BUBBLE STUDY  11/21/2019   Procedure: BUBBLE STUDY;  Surgeon: [name], MD;  Location: [facility] ENDOSCOPY;  Service: Cardiovascular;;  . CARPAL TUNNEL RELEASE Left 09/07/2014   Procedure: LEFT CARPAL TUNNEL RELEASE;  Surgeon: [name], MD;  Location: [facility];  Service: Orthopedics;  Laterality: Left;  . CARPAL TUNNEL RELEASE Right 10/12/2014   Procedure: RIGHT CARPAL TUNNEL RELEASE;  Surgeon: [name], MD;  Location: [facility];  Service: Orthopedics;  Laterality: Right;  . KNEE ARTHROSCOPY Left   . PATENT FORAMEN OVALE(PFO) CLOSURE N/A 01/31/2020   Procedure: PATENT FORAMEN OVALE (PFO) CLOSURE;  Surgeon: [name], MD;  Location: [facility] INVASIVE CV LAB;  Service: Cardiovascular;  Laterality: N/A;  . SUBOCCIPITAL CRANIECTOMY CERVICAL LAMINECTOMY N/A 11/17/2019   Procedure: SUBOCCIPITAL DECOMPRESSIVE  CRANIECTOMY;  Surgeon: [name], MD;  Location: [facility] OR;  Service: Neurosurgery;  Laterality: N/A;  . TEE WITHOUT CARDIOVERSION N/A 11/21/2019   Procedure: TRANSESOPHAGEAL ECHOCARDIOGRAM (TEE);  Surgeon: [name], MD;  Location: [facility] ENDOSCOPY;  Service: Cardiovascular;  Laterality: N/A;    There were no vitals filed for this visit.   Subjective Assessment - 02/12/20 0939    Subjective  Pt had PFO closure on 01/31/20 - doctor said I could resume therapy after a week, that's why I cancelled last week.. I haven't had any decline from the procedure. Pt also reports passing a kidney stone this morning and not feeling great.    Pertinent History LT PICA infarct. PMH: HTN, HLD    Limitations No driving    Currently in Pain? Yes    Pain Score 8     Pain Location Back    Pain Orientation Lower    Pain Descriptors / Indicators Aching;Pounding    Pain Type Acute pain    Pain Onset More than a month ago    Pain Frequency Constant    Aggravating Factors  worse today because he passed a kidney stone    Pain Relieving Factors hot shower          Practiced sit to stand from low surface while on compliant surface without UE support x 5 reps.  Seated on physioball: practiced head turns side to side and diagonal patterns w/  no dizziness reported. Environmental scanning finding 13/14 items w/ only mild dizziness when looking up to Rt side.  Visual scanning with cognitive component with 100% accuracy. Discussed return to driving with MD clearance and graduated driving program with direct supervision.  Reviewed goals and progress to date.                            OT Long Term Goals - 02/12/20 1011      OT LONG TERM GOAL #1   Title Pt will perform environmental scanning w/ DME prn at 90% accuracy with dizziness less than or equal to 3/10    Time 8    Period Weeks    Status Achieved   01/17/20: pt found 12/13 items on first pass, 02/12/20: pt found 13/14 items w/  mild dizziness only once     OT LONG TERM GOAL #2   Title Pt will read page of text (larger print prn) with only min difficulty    Time 8    Period Weeks    Status Achieved      OT LONG TERM GOAL #3   Title Pt will perform dynamic standing activities (reaching low/high) w/ one hand countertop support and no LOB    Time 8    Period Weeks    Status Achieved      OT LONG TERM GOAL #4   Title Pt will verbalize understanding with memory strategies and other compensations prn    Time 8    Period Weeks    Status Achieved      OT LONG TERM GOAL #5   Title Pt/family will verbalize understanding of driving eval info prn    Time 8    Period Weeks    Status Deferred                 Plan - 02/12/20 1423    Clinical Impression Statement Pt has met all LTG's at this time. Pt reports no further O.T. needs    Occupational performance deficits (Please refer to evaluation for details): IADL's;Work;Leisure    Body Structure / Function / Physical Skills IADL;Vestibular;Coordination;Vision    Cognitive Skills Memory;Attention;Safety Awareness    Rehab Potential Excellent    OT Treatment/Interventions Self-care/ADL training;Functional Mobility Training;Neuromuscular education;Therapeutic activities;Coping strategies training;Visual/perceptual remediation/compensation;Patient/family education;DME and/or AE instruction;Cognitive remediation/compensation    Plan D/C O.T.    Consulted and Agree with Plan of Care Patient           Patient will benefit from skilled therapeutic intervention in order to improve the following deficits and impairments:   Body Structure / Function / Physical Skills: IADL, Vestibular, Coordination, Vision Cognitive Skills: Memory, Attention, Safety Awareness     Visit Diagnosis: Other symptoms and signs involving cognitive functions following cerebral infarction  Muscle weakness (generalized)  Unsteadiness on feet    Problem List Patient Active Problem  List   Diagnosis Date Noted  . Transaminitis   . PFO with atrial septal aneurysm 11/22/2019  . Headache 11/22/2019  . Cerebral edema ([place]) 11/22/2019  . Chest pain 11/22/2019  . Hyperlipidemia LDL goal <70 11/22/2019  . OSA (obstructive sleep apnea) 11/22/2019  . 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 Vascular headache   . Dyslipidemia   . Hypertension   . Intractable nausea and vomiting   . Hypokalemia   . Depression with anxiety   . Ischemic stroke ([place]) - L PICA infarct embolic  in setting of PFO s/p crani   . Lumbar spondylolysis 07/31/2019  . Lumbar radiculopathy 04/07/2019  . Right foot drop 04/07/2019  . Backache 07/05/2017  . DDD (degenerative disc disease), lumbar 07/05/2017  . Spinal stenosis of lumbar region 07/05/2017     OCCUPATIONAL THERAPY DISCHARGE SUMMARY  Visits from Start of Care: 5  Current functional level related to goals / functional outcomes: Pt met all LTG's - see above   Remaining deficits: Dizziness w/ head turns when walking   Education / Equipment: HEP, safety strategies  Plan: Patient agrees to discharge.  Patient goals were met. Patient is being discharged due to meeting the stated rehab goals.  ?????        Carey Bullocks, OTR/L  02/12/2020, 2:25 PM  [facility] [address] [place], [place], [address] Phone: [phone]   Fax:  [phone]  Name: [name] MRN: [id number] Date of Birth: Apr 24, 1971

## 2020-02-13 ENCOUNTER — Encounter: Payer: 59 | Admitting: Physical Medicine & Rehabilitation

## 2020-02-14 ENCOUNTER — Ambulatory Visit: Payer: 59

## 2020-02-14 ENCOUNTER — Ambulatory Visit: Payer: 59 | Admitting: Occupational Therapy

## 2020-02-14 ENCOUNTER — Other Ambulatory Visit: Payer: Self-pay

## 2020-02-14 DIAGNOSIS — I69318 Other symptoms and signs involving cognitive functions following cerebral infarction: Secondary | ICD-10-CM

## 2020-02-14 DIAGNOSIS — R2689 Other abnormalities of gait and mobility: Secondary | ICD-10-CM | POA: Diagnosis not present

## 2020-02-14 DIAGNOSIS — R2681 Unsteadiness on feet: Secondary | ICD-10-CM

## 2020-02-14 DIAGNOSIS — M6281 Muscle weakness (generalized): Secondary | ICD-10-CM

## 2020-02-14 NOTE — Therapy (Signed)
[facility] [address] [place], [place], [address] Phone: [phone]   Fax:  [phone]  Physical Therapy Treatment  Patient Details  Name: [name] MRN: [id number] Date of Birth: 12-Sep-1970 Referring Provider (PT): [name], [name], [name], MD   Encounter Date: 02/14/2020   PT End of Session - 02/14/20 1019    Visit Number 11    Number of Visits 17    Date for PT Re-Evaluation 03/24/20   POC for 8 weeks, Cert for 90 days   Authorization Type [employer]    PT Start Time 1016    PT Stop Time 1100    PT Time Calculation (min) 44 min    Equipment Utilized During Treatment Gait belt    Activity Tolerance Patient tolerated treatment well;No increased pain    Behavior During Therapy [facility] for tasks assessed/performed           Past Medical History:  Diagnosis Date   Arthritis    neck   Carpal tunnel syndrome on both sides 08/2014   DDD (degenerative disc disease), lumbar    History of kidney stones    Hypertension    under control with med., has been on med. x 1 yr.   Sleep apnea    CPAP but does not use   Stroke (HCC) 11/16/2019    Past Surgical History:  Procedure Laterality Date   BUBBLE STUDY  11/21/2019   Procedure: BUBBLE STUDY;  Surgeon: [name], MD;  Location: [facility] ENDOSCOPY;  Service: Cardiovascular;;   CARPAL TUNNEL RELEASE Left 09/07/2014   Procedure: LEFT CARPAL TUNNEL RELEASE;  Surgeon: [name], MD;  Location: [facility];  Service: Orthopedics;  Laterality: Left;   CARPAL TUNNEL RELEASE Right 10/12/2014   Procedure: RIGHT CARPAL TUNNEL RELEASE;  Surgeon: [name], MD;  Location: [facility];  Service: Orthopedics;  Laterality: Right;   KNEE ARTHROSCOPY Left    PATENT FORAMEN OVALE(PFO) CLOSURE N/A 01/31/2020   Procedure: PATENT FORAMEN OVALE (PFO) CLOSURE;  Surgeon: [name], MD;  Location: [facility] INVASIVE CV LAB;  Service: Cardiovascular;  Laterality: N/A;   SUBOCCIPITAL CRANIECTOMY CERVICAL LAMINECTOMY N/A 11/17/2019   Procedure: SUBOCCIPITAL DECOMPRESSIVE CRANIECTOMY;  Surgeon: [name], MD;  Location: [facility] OR;  Service: Neurosurgery;  Laterality: N/A;   TEE WITHOUT CARDIOVERSION N/A 11/21/2019   Procedure: TRANSESOPHAGEAL ECHOCARDIOGRAM (TEE);  Surgeon: [name], MD;  Location: [facility] ENDOSCOPY;  Service: Cardiovascular;  Laterality: N/A;    There were no vitals filed for this visit.   Subjective Assessment - 02/14/20 1020    Subjective Patient reports general body aches this morning. No new issues/complaints to report.    Patient is accompained by: Family member   Wife [name]   Pertinent History hypertension, hyperlipidemia, depression/anxiety, degenerative disc disease in lumbar region, obesity, sleep apnea    Limitations Walking;Standing    Patient Stated Goals Back to Normal, Do the things I did prior    Currently in Pain? Yes    Pain Score 6     Pain Location Generalized    Pain Orientation Other (Comment)   Generalized; All Over   Pain Descriptors / Indicators Aching    Pain Type Acute pain    Pain Onset More than a month ago                             [facility]  PT Treatment/Exercise - 02/14/20 0001      Ambulation/Gait   Ambulation/Gait Yes    Ambulation/Gait Assistance 5: Supervision    Ambulation/Gait Assistance Details completed gait training without AD on indoor surfaces, superivison throughout. no instances of imbalance, patient demo heavy reliance on vision    Ambulation Distance (Feet) 300 Feet    Assistive device None    Gait Pattern Step-through pattern;Decreased step length - left;Decreased stance time - right;Decreased dorsiflexion - right;Right steppage;Wide base of support;Poor foot clearance - right    Ambulation Surface Level;Indoor      High Level Balance   High Level Balance  Activities Head turns    High Level Balance Comments Completed ambulation with horizontal head turns x 150 ft, followed by completing diagonal head turns to R/L x 150 ft. Increased sway and dysequilibrium noted with head turns during ambulation. Require intermittent CGA      Neuro Re-ed    Neuro Re-ed Details  Seated on green air disc completed the following: alternating LAQ 2 x 10 reps, and alternating marching 2 x 10. verbal cues for abdominal engagement and core control. Patient demo increased difficulty with alternating marching and difficulty maintianing balance seated on disc. In // bars: completed toe tap to cone with followed stepover to further promote SLS and balance, completed x 4 laps, down and back. Progressed to completing reciprocal stepping over orange hurdles x 4 laps, down and back in // bars. Intermittent UE support required for toe tap with addition of stepover, due to increased balance challenge.       Exercises   Exercises Other Exercises    Other Exercises  completed alternating dead bugs 2 x 5 reps to promote improved coordination and abdominal activation. PT providing verbal cues for sequencing and core contraction with completion. Seated on edge of mat, completed seated trunk flexion stretch with therapy ball x 10 reps with 10 second hold to promote reduced tension in low back. patient reports improved pain after completion.                     PT Short Term Goals - 01/26/20 0954      PT SHORT TERM GOAL #1   Title patient will be independent with initial HEP for strengthening/balance (All STG's Due: 01/22/20)    Baseline patient reports independence with HEP    Time 4    Period Weeks    Status Achieved    Target Date 01/22/20      PT SHORT TERM GOAL #2   Title Patient will demo ability to ambulate >500 ft on indoor level surfaces with LRAD and supervision to demo improved household mobility    Baseline 575 indoor, supervision with SPC with quad tip attachment  one instance of CGA    Time 4    Period Weeks    Status Partially Met      PT SHORT TERM GOAL #3   Title Patient will improve TUG score w/ LRAD to </= 25 seconds to demo improved functional mobility    Baseline 35.84 secs, 13.00 secs w/o AD    Time 4    Period Weeks    Status Achieved      PT SHORT TERM GOAL #4   Title Patient will improve 5x sit <> stand to </= 30 seconds to demonstrate improved functional strength    Baseline 49.56 secs w/ UE support, 17.76 secs w/o UE support    Time 4    Period Weeks  Status Achieved      PT SHORT TERM GOAL #5   Title Berg Balance to be assessed and LTG to be set as appropriate    Baseline baseline established LTG set.    Time 4    Period Weeks    Status Achieved             PT Long Term Goals - 01/01/20 1255      PT LONG TERM GOAL #1   Title Patient will be independent with final HEP for strengthening/balance (All LTG's due: 02/19/20)    Baseline HEP established    Time 8    Period Weeks    Status New      PT LONG TERM GOAL #2   Title Patient will demonstrate ability to ambulate >1000 on indoor/outdoor surfaces with LRAD and supervision for improved community mobility    Baseline 100 ft CGA    Time 8    Period Weeks    Status New      PT LONG TERM GOAL #3   Title Patient will improve gait speed to >/= 2.0 ft/sec  with LRAD to demonstrate improved community ambulation    Baseline 0.69 ft/sec    Time 8    Period Weeks    Status New      PT LONG TERM GOAL #4   Title Patient will improve 5x sit <> stand to </= 20 secs to demonstrate improved functional mobility    Baseline 49.56 secs    Time 8    Period Weeks    Status New      PT LONG TERM GOAL #5   Title Patient will improve Tug to </= 15 seconds with LRAD to demo improved mobility and reduced fall risk    Baseline 35.84 secs    Time 8    Period Weeks    Status New      PT LONG TERM GOAL #6   Title Patient will improve Berg Balance to >/= 45/56 to demonstrate  reduced fall risk and improved balance    Baseline 39/56    Time 8    Period Weeks    Status New                 Plan - 02/14/20 1107    Clinical Impression Statement Today's skilled PT session included continued dynamic balance activites including walking with head turns and negotiating over obstacles. Completed seated activities to promote seated balance and coordination, patient demo increased difficulty with seated on green air disc due to decreased trunk control. Will continue to progress toward all LTG's.    Personal Factors and Comorbidities Comorbidity 3+    Comorbidities hypertension, hyperlipidemia, depression/anxiety, degenerative disc disease in lumbar region, obesity, sleep apnea    Examination-Activity Limitations Bend;Transfers;Stairs;Stand;Lift    Examination-Participation Restrictions Occupation;Yard Work;Community Activity;Driving    Stability/Clinical Decision Making Evolving/Moderate complexity    Rehab Potential Good    PT Frequency 2x / week    PT Duration 8 weeks    PT Treatment/Interventions ADLs/Self Care Home Management;Aquatic Therapy;Cryotherapy;Electrical Stimulation;Moist Heat;DME Instruction;Gait training;Stair training;Functional mobility training;Therapeutic activities;Therapeutic exercise;Balance training;Neuromuscular re-education;Patient/family education;Orthotic Fit/Training;Manual techniques;Passive range of motion;Vestibular    PT Next Visit Plan Check LTGs and Re-Cert. Functional [place] (standing), Balance Exercises. Slowly incorporating vision removed, retropulsion noted with EC.    Consulted and Agree with Plan of Care Patient;Family member/caregiver    Family Member Consulted Wife           Patient will benefit from skilled  therapeutic intervention in order to improve the following deficits and impairments:  Abnormal gait, Decreased balance, Decreased endurance, Difficulty walking, Impaired sensation, Dizziness, Decreased range of  motion, Decreased activity tolerance, Decreased knowledge of use of DME, Decreased strength, Pain  Visit Diagnosis: Other symptoms and signs involving cognitive functions following cerebral infarction  Muscle weakness (generalized)  Unsteadiness on feet  Other abnormalities of gait and mobility     Problem List Patient Active Problem List   Diagnosis Date Noted   Transaminitis    PFO with atrial septal aneurysm 11/22/2019   Headache 11/22/2019   Cerebral edema ([place]) 11/22/2019   Chest pain 11/22/2019   Hyperlipidemia LDL goal <70 11/22/2019   OSA (obstructive sleep apnea) 11/22/2019   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) 11/22/2019   Vascular headache    Dyslipidemia    Hypertension    Intractable nausea and vomiting    Hypokalemia    Depression with anxiety    Ischemic stroke ([place]) - L PICA infarct embolic in setting of PFO s/p crani    Lumbar spondylolysis 07/31/2019   Lumbar radiculopathy 04/07/2019   Right foot drop 04/07/2019   Backache 07/05/2017   DDD (degenerative disc disease), lumbar 07/05/2017   Spinal stenosis of lumbar region 07/05/2017    Jones Bales, PT, DPT 02/14/2020, 11:11 AM  [facility] [address] [place], [place], [address] Phone: [phone]   Fax:  [phone]  Name: [name] MRN: [id number] Date of Birth: 1970/08/11

## 2020-02-16 ENCOUNTER — Ambulatory Visit: Payer: 59 | Admitting: Physical Medicine & Rehabilitation

## 2020-02-20 ENCOUNTER — Ambulatory Visit: Payer: 59 | Attending: Physician Assistant

## 2020-02-20 ENCOUNTER — Other Ambulatory Visit: Payer: Self-pay

## 2020-02-20 DIAGNOSIS — M6281 Muscle weakness (generalized): Secondary | ICD-10-CM | POA: Diagnosis present

## 2020-02-20 DIAGNOSIS — R2681 Unsteadiness on feet: Secondary | ICD-10-CM | POA: Insufficient documentation

## 2020-02-20 DIAGNOSIS — R278 Other lack of coordination: Secondary | ICD-10-CM | POA: Diagnosis present

## 2020-02-20 DIAGNOSIS — R2689 Other abnormalities of gait and mobility: Secondary | ICD-10-CM | POA: Insufficient documentation

## 2020-02-20 DIAGNOSIS — M21371 Foot drop, right foot: Secondary | ICD-10-CM | POA: Insufficient documentation

## 2020-02-20 NOTE — Therapy (Signed)
[facility] [address] [place], [place], [address] Phone: [phone]   Fax:  [phone]  Physical Therapy Treatment/Re-Certification  Patient Details  Name: [name] MRN: [id number] Date of Birth: Jan 12, 1971 Referring Provider (PT): Referred by: Lauraine Rinne, [name], being followed by: Alysia Penna, MD   Encounter Date: 02/20/2020   PT End of Session - 02/20/20 1230    Visit Number 12    Number of Visits 24    Date for PT Re-Evaluation 05/20/20   POC for 6 weeks, cert for 90 days   Authorization Type [employer]    PT Start Time 1230    PT Stop Time 1315    PT Time Calculation (min) 45 min    Equipment Utilized During Treatment Gait belt    Activity Tolerance Patient tolerated treatment well;No increased pain    Behavior During Therapy WFL for tasks assessed/performed           Past Medical History:  Diagnosis Date  . Arthritis    neck  . Carpal tunnel syndrome on both sides 08/2014  . DDD (degenerative disc disease), lumbar   . History of kidney stones   . Hypertension    under control with med., has been on med. x 1 yr.  . Sleep apnea    CPAP but does not use  . Stroke [facility]) 11/16/2019    Past Surgical History:  Procedure Laterality Date  . BUBBLE STUDY  11/21/2019   Procedure: BUBBLE STUDY;  Surgeon: Buford Dresser, MD;  Location: [facility];  Service: Cardiovascular;;  . CARPAL TUNNEL RELEASE Left 09/07/2014   Procedure: LEFT CARPAL TUNNEL RELEASE;  Surgeon: Roseanne Kaufman, MD;  Location: [facility];  Service: Orthopedics;  Laterality: Left;  . CARPAL TUNNEL RELEASE Right 10/12/2014   Procedure: RIGHT CARPAL TUNNEL RELEASE;  Surgeon: Roseanne Kaufman, MD;  Location: [place] [facility];  Service: Orthopedics;  Laterality: Right;  . KNEE ARTHROSCOPY Left   . PATENT FORAMEN OVALE(PFO) CLOSURE N/A 01/31/2020   Procedure: PATENT FORAMEN OVALE (PFO) CLOSURE;   Surgeon: Sherren Mocha, MD;  Location: [facility] CV [facility];  Service: Cardiovascular;  Laterality: N/A;  . SUBOCCIPITAL CRANIECTOMY CERVICAL LAMINECTOMY N/A 11/17/2019   Procedure: SUBOCCIPITAL DECOMPRESSIVE CRANIECTOMY;  Surgeon: Judith Part, MD;  Location: [facility];  Service: Neurosurgery;  Laterality: N/A;  . TEE WITHOUT CARDIOVERSION N/A 11/21/2019   Procedure: TRANSESOPHAGEAL ECHOCARDIOGRAM (TEE);  Surgeon: Buford Dresser, MD;  Location: [facility] ENDOSCOPY;  Service: Cardiovascular;  Laterality: N/A;    There were no vitals filed for this visit.   Subjective Assessment - 02/20/20 1232    Subjective Patient reports that he has continued to pass kidney stones, which has caused him alot of pain. Patient reports that he passed one this morning. Patient reports that did have some dizziness this morning when he turned quickly, but no falls to report.    Patient is accompained by: Family member   [name] [name]   Pertinent History hypertension, hyperlipidemia, depression/anxiety, degenerative disc disease in lumbar region, obesity, sleep apnea    Limitations Walking;Standing    Patient Stated Goals Back to Normal, Do the things I did prior    Currently in Pain? Yes    Pain Score 7     Pain Location Generalized    Pain Orientation Other (Comment)   generalized; all over   Pain Descriptors / Indicators Aching    Pain Onset More than a month ago  [facility] Adult PT Treatment/Exercise - 02/20/20 0001      Transfers   Transfers Sit to Stand;Stand to Sit    Sit to Stand 6: Modified independent (Device/Increase time)    Five time sit to stand comments  16.2 seconds from mat at standard chair height with UE support.     Stand to Sit 6: Modified independent (Device/Increase time)      Ambulation/Gait   Ambulation/Gait Yes    Ambulation/Gait Assistance 5: Supervision;4: Min guard    Ambulation/Gait Assistance Details patient ambulating x 1010 ft on  indoor surfaces without. Patient did require intermittent CGA at times due to imbalance with turns around corners and in narrow areas in crowded gym.     Ambulation Distance (Feet) 1010 Feet    Assistive device None    Gait Pattern Step-through pattern;Decreased step length - left;Decreased stance time - right;Decreased dorsiflexion - right;Right steppage;Wide base of support;Poor foot clearance - right    Ambulation Surface Level;Indoor    Gait velocity 11.09 secs = 2.96 ft/sec      Standardized Balance Assessment   Standardized Balance Assessment Berg Balance Test;Timed Up and Go Test      Berg Balance Test   Sit to Stand Able to stand without using hands and stabilize independently    Standing Unsupported Able to stand safely 2 minutes   increased sway but able to maintain balance   Sitting with Back Unsupported but Feet Supported on Floor or Stool Able to sit safely and securely 2 minutes    Stand to Sit Sits safely with minimal use of hands    Transfers Able to transfer safely, minor use of hands    Standing Unsupported with Eyes Closed Able to stand 10 seconds with supervision   increased sway, require supervision   Standing Ubsupported with Feet Together Able to place feet together independently and stand for 1 minute with supervision    From Standing, Reach Forward with Outstretched Arm Can reach confidently >25 cm (10")    From Standing Position, Pick up Object from Floor Able to pick up shoe, needs supervision    From Standing Position, Turn to Look Behind Over each Shoulder Looks behind from both sides and weight shifts well    Turn 360 Degrees Able to turn 360 degrees safely but slowly    Standing Unsupported, Alternately Place Feet on Step/Stool Able to stand independently and complete 8 steps >20 seconds    Standing Unsupported, One Foot in Front Able to take small step independently and hold 30 seconds    Standing on One Leg Tries to lift leg/unable to hold 3 seconds but  remains standing independently    Total Score 45      Timed Up and Go Test   TUG Normal TUG    Normal TUG (seconds) 12.81   w/o AD     Exercises   Exercises Other Exercises    Other Exercises  Reviewed HEP and educated to continue completion for further progress.                   PT Education - 02/20/20 1236    Education Details educated on progress toward LTG, updated POC    Person(s) Educated Patient    Methods Explanation    Comprehension Verbalized understanding            PT Short Term Goals - 01/26/20 0954      PT SHORT TERM GOAL #1   Title patient will be  independent with initial HEP for strengthening/balance (All STG's Due: 01/22/20)    Baseline patient reports independence with HEP    Time 4    Period Weeks    Status Achieved    Target Date 01/22/20      PT SHORT TERM GOAL #2   Title Patient will demo ability to ambulate >500 ft on indoor level surfaces with LRAD and supervision to demo improved household mobility    Baseline 575 indoor, supervision with SPC with quad tip attachment one instance of CGA    Time 4    Period Weeks    Status Partially Met      PT SHORT TERM GOAL #3   Title Patient will improve TUG score w/ LRAD to </= 25 seconds to demo improved functional mobility    Baseline 35.84 secs, 13.00 secs w/o AD    Time 4    Period Weeks    Status Achieved      PT SHORT TERM GOAL #4   Title Patient will improve 5x sit <> stand to </= 30 seconds to demonstrate improved functional strength    Baseline 49.56 secs w/ UE support, 17.76 secs w/o UE support    Time 4    Period Weeks    Status Achieved      PT SHORT TERM GOAL #5   Title Berg Balance to be assessed and LTG to be set as appropriate    Baseline baseline established LTG set.    Time 4    Period Weeks    Status Achieved             PT Long Term Goals - 02/20/20 1241      PT LONG TERM GOAL #1   Title Patient will be independent with final HEP for strengthening/balance (All  LTG's due: 02/19/20)    Baseline Patient reports independence with HEP    Time 8    Period Weeks    Status Achieved      PT LONG TERM GOAL #2   Title Patient will demonstrate ability to ambulate >1000 on indoor/outdoor surfaces with LRAD and supervision for improved community mobility    Baseline 1100 ft with supervision, CGA intermittent due to imbalance with turns.    Time 8    Period Weeks    Status Partially Met      PT LONG TERM GOAL #3   Title Patient will improve gait speed to >/= 2.0 ft/sec  with LRAD to demonstrate improved community ambulation    Baseline 0.69 ft/sec, 2.96 ft/sec    Time 8    Period Weeks    Status Achieved      PT LONG TERM GOAL #4   Title Patient will improve 5x sit <> stand to </= 20 secs to demonstrate improved functional mobility    Baseline 49.56 secs, 16.2 secs w/ UE support    Time 8    Period Weeks    Status Achieved      PT LONG TERM GOAL #5   Title Patient will improve Tug to </= 15 seconds with LRAD to demo improved mobility and reduced fall risk    Baseline 35.84 secs, 12.81 secs w/o AD    Time 8    Period Weeks    Status Achieved      PT LONG TERM GOAL #6   Title Patient will improve Berg Balance to >/= 45/56 to demonstrate reduced fall risk and improved balance    Baseline 39/56, 45/56  Time 8    Period Weeks    Status Achieved           Updated Short Term Goals:   PT Short Term Goals - 02/20/20 1329      PT SHORT TERM GOAL #1   Title STG = LTG           Updated Long Term Goals:   PT Long Term Goals - 02/20/20 1241      PT LONG TERM GOAL #1   Title Patient will be independent with progressive HEP for balance/coordination (All LTG's due: 04/02/20)    Baseline continue to progress HEP    Time 6    Period Weeks    Status Revised    Target Date 04/02/20      PT LONG TERM GOAL #2   Title Patient will demonstrate ability to ambulate >1000 on indoor/outdoor surfaces without AD, and supervision for improved  community mobility    Baseline 1100 ft with supervision, CGA intermittent due to imbalance with turns.    Time 6    Period Weeks    Status Revised      PT LONG TERM GOAL #3   Title Patient will demo ability to ambulate > 115 ft with scanning environment and no imbalance to demonstrate improved community negotiation    Baseline imbalance with head turns    Time 6    Period Weeks    Status Revised      PT LONG TERM GOAL #4   Title Patient will improve Berg Balance to >/= 50/56 to demonstrate reduced fall risk and improved balance    Baseline 45/56    Time 6    Period Weeks    Status Revised      PT LONG TERM GOAL #5   Title --    Baseline --    Time --    Period --    Status --      PT LONG TERM GOAL #6   Title --    Baseline --    Time --    Period --    Status --               Plan - 02/20/20 1326    Clinical Impression Statement Today's skilled PT session included assessment of patient's progress toward LTG's. Patient able to meet all LTGs today, with partially meeting LTG #2 due to requiring CGA with ambulation when body turns are incorporated and in busy environment due to imbalance. Patient has demonstrated progress with PT services, demonstrating improved standing balance and functional mobility. Patient scored 45/56 today on Berg Balance Test, demonstrating reduced fall risk. Patient still demonstrates increased balance challenge and truncal ataxia with various activities especially vision removed.  Patient will continue to benefit from skilled PT services to further reduce risk for falls and maximize functional mobility at this time.    Personal Factors and Comorbidities Comorbidity 3+    Comorbidities hypertension, hyperlipidemia, depression/anxiety, degenerative disc disease in lumbar region, obesity, sleep apnea    Examination-Activity Limitations Bend;Transfers;Stairs;Stand;Lift    Examination-Participation Restrictions Occupation;Yard Work;Community  Activity;Driving    Stability/Clinical Decision Making Evolving/Moderate complexity    Rehab Potential Good    PT Frequency 2x / week    PT Duration 6 weeks    PT Treatment/Interventions ADLs/Self Care Home Management;Aquatic Therapy;Cryotherapy;Electrical Stimulation;Moist Heat;DME Instruction;Gait training;Stair training;Functional mobility training;Therapeutic activities;Therapeutic exercise;Balance training;Neuromuscular re-education;Patient/family education;Orthotic Fit/Training;Manual techniques;Passive range of motion;Vestibular    PT Next Visit Plan Balance Exercises. Working on  head turns, full body turns. Slowly incorporating vision removed, retropulsion noted with EC.    Consulted and Agree with Plan of Care Patient;Family member/caregiver    Family Member Consulted [name]           Patient will benefit from skilled therapeutic intervention in order to improve the following deficits and impairments:  Abnormal gait, Decreased balance, Decreased endurance, Difficulty walking, Impaired sensation, Dizziness, Decreased range of motion, Decreased activity tolerance, Decreased knowledge of use of DME, Decreased strength, Pain  Visit Diagnosis: Muscle weakness (generalized)  Unsteadiness on feet  Other abnormalities of gait and mobility  Foot drop, right  Other lack of coordination     Problem List Patient Active Problem List   Diagnosis Date Noted  . Transaminitis   . PFO with atrial septal aneurysm 11/22/2019  . Headache 11/22/2019  . Cerebral edema ([place]) 11/22/2019  . Chest pain 11/22/2019  . Hyperlipidemia LDL goal <70 11/22/2019  . OSA (obstructive sleep apnea) 11/22/2019  . 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 Vascular headache   . Dyslipidemia   . Hypertension   . Intractable nausea and vomiting   . Hypokalemia   . Depression with anxiety   . Ischemic stroke ([place]) - L PICA infarct embolic in setting of PFO s/p crani     . Lumbar spondylolysis 07/31/2019  . Lumbar radiculopathy 04/07/2019  . Right foot drop 04/07/2019  . Backache 07/05/2017  . DDD (degenerative disc disease), lumbar 07/05/2017  . Spinal stenosis of lumbar region 07/05/2017    Jones Bales, PT, DPT 02/20/2020, 1:29 PM  [facility] [address] [place], [place], [address] Phone: [phone]   Fax:  [phone]  Name: ROWE WARMAN MRN: [id number] Date of Birth: March 23, 1971

## 2020-02-21 ENCOUNTER — Ambulatory Visit: Payer: 59

## 2020-02-23 ENCOUNTER — Encounter: Payer: Self-pay | Admitting: Physical Medicine & Rehabilitation

## 2020-02-23 ENCOUNTER — Other Ambulatory Visit: Payer: Self-pay

## 2020-02-23 ENCOUNTER — Encounter: Payer: 59 | Attending: Registered Nurse | Admitting: Physical Medicine & Rehabilitation

## 2020-02-23 VITALS — BP 133/89 | HR 85 | Temp 98.1°F | Ht 75.0 in | Wt 310.0 lb

## 2020-02-23 DIAGNOSIS — I639 Cerebral infarction, unspecified: Secondary | ICD-10-CM

## 2020-02-23 MED ORDER — TOPIRAMATE 100 MG PO TABS: 100.0000 mg | ORAL_TABLET | Freq: Two times a day (BID) | ORAL | 2 refills | Status: DC

## 2020-02-23 MED ORDER — DULOXETINE HCL 30 MG PO CPEP: 30.0000 mg | ORAL_CAPSULE | Freq: Every day | ORAL | 1 refills | Status: DC

## 2020-02-23 NOTE — Patient Instructions (Signed)
Duloxetine for body pain, depression, anxiety   Topiramate for headache  Avoid taking meclizine, fioricet, soma, as they may contribute to fatigue

## 2020-02-23 NOTE — Progress Notes (Addendum)
Subjective:    Patient ID: [name], male    DOB: [birth year], 49 y.o.   MRN: [id number] 49 y.o. right-handed male with history of hypertension, sleep apnea with intermittent CPAP, hyperlipidemia, depression with anxiety, obesity with BMI 39.68.  Patient lives with spouse.  Works as a Building control surveyor.  1 level home 3 steps to entry.  Presented 11/16/2019 with dizziness and gait abnormality.  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  Additional punctate acute infarct question within the left occipital lobe.  CT angiogram of head and neck negative for large vessel occlusion or stenosis.  Echocardiogram with ejection fraction of 65% normal wall abnormalities.  TCD Doppler with bubble positive for PFO at rest.  TEE highly mobile intra-atrial septum.  But no clear PFO or ASD flow seen despite multiple angle windows.  Admission chemistries potassium 3.3 glucose 189 troponin negative alcohol negative lactic acid 5.2.  Hospital course further complicated by increasing headaches and lethargy.  Follow-up CT imaging showed brainstem compression and obstructive hydrocephalus with patient undergoing suboccipital craniotomy 11/17/2019 per Dr. Venetia Constable.  Neurology follow-up patient was cleared to begin aspirin and Plavix for CVA prophylaxis x3 weeks then aspirin alone.  Patient would follow-up Dr. Burt Knack to consider PFO closure as an outpatient.  Patient was cleared to begin subcutaneous Lovenox for DVT prophylaxis.  Initially maintained on Depakote for persistent headaches.  Close monitoring of blood pressure initially on Cleviprex  Admit date: 11/22/2019 Discharge date: 12/06/2019 HPI Since last visit in August, the patient has undergone PFO closure per Dr. Sherren Mocha.  The patient had no postoperative complications.  He feels like he is making progress although he needs to remind himself of where he started compared to where he is right now.  Reviewed therapy  notes, no longer receiving OT since he met OT goals.  PT continues to work on balance and improving fatigue. Main complaints today are headache, he has come off his topiramate about a week ago with some increased pain in fact this sometimes wakes him up. In addition the patient complains of all over body pain that has increased since his stroke.  He states that he has seen a rheumatologist for this problem before.  He was told that he has something like fibromyalgia even though it is not fibromyalgia. He is independent with all self-care and mobility He ambulates without assistive device and goes over 1000 feet.  He does have to move slowly and has  wide-based gait.  He has difficulty turning and must do this very slowly He lives with his wife, he has not returned to work.  He has a very physical job as a Building control surveyor and must go up and down tall ladders and work at [employer].  He has not returned to driving. He still has problems with dizziness and visual blurring when he shifts his focus.  He had particular problems going to a flea market and had to sit down Pain Inventory Average Pain 8 Pain Right Now 8 My pain is ?  LOCATION OF PAIN  Back pain, pain all over  BOWEL Number of stools per week: 4 Oral laxative use No  Type of laxative n/a Enema or suppository use No  History of colostomy No  Incontinent No   BLADDER Normal In and out cath, frequency n/a Able to self cath na/a Bladder incontinence No  Frequent urination No  Leakage with coughing No  Difficulty starting stream No  Incomplete  bladder emptying No    Mobility walk without assistance ability to climb steps?  no do you drive?  no  Function disabled: date disabled .  Neuro/Psych dizziness confusion depression anxiety  Prior Studies Any changes since last visit?  no  Physicians involved in your care Any changes since last visit?  no   Family History  Problem Relation Age of Onset  . Heart attack Mother     Social History   Socioeconomic History  . Marital status: Married    Spouse name: [name]  . Number of children: Not on file  . Years of education: Not on file  . Highest education level: Not on file  Occupational History  . Not on file  Tobacco Use  . Smoking status: Never Smoker  . Smokeless tobacco: Never Used  Vaping Use  . Vaping Use: Former  Substance and Sexual Activity  . Alcohol use: Not Currently    Comment: quit 2006  . Drug use: No  . Sexual activity: Yes  Other Topics Concern  . Not on file  Social History Narrative   Right handed    Lives with wife    Social Determinants of Health   Financial Resource Strain:   . Difficulty of Paying Living Expenses: Not on file  Food Insecurity:   . Worried About Charity fundraiser in the Last Year: Not on file  . Ran Out of Food in the Last Year: Not on file  Transportation Needs:   . Lack of Transportation (Medical): Not on file  . Lack of Transportation (Non-Medical): Not on file  Physical Activity:   . Days of Exercise per Week: Not on file  . Minutes of Exercise per Session: Not on file  Stress:   . Feeling of Stress : Not on file  Social Connections:   . Frequency of Communication with Friends and Family: Not on file  . Frequency of Social Gatherings with Friends and Family: Not on file  . Attends Religious Services: Not on file  . Active Member of Clubs or Organizations: Not on file  . Attends Archivist Meetings: Not on file  . Marital Status: Not on file   Past Surgical History:  Procedure Laterality Date  . BUBBLE STUDY  11/21/2019   Procedure: BUBBLE STUDY;  Surgeon: Buford Dresser, MD;  Location: [place];  Service: Cardiovascular;;  . CARPAL TUNNEL RELEASE Left 09/07/2014   Procedure: LEFT CARPAL TUNNEL RELEASE;  Surgeon: Roseanne Kaufman, MD;  Location: [place];  Service: Orthopedics;  Laterality: Left;  . CARPAL TUNNEL RELEASE Right 10/12/2014   Procedure:  RIGHT CARPAL TUNNEL RELEASE;  Surgeon: Roseanne Kaufman, MD;  Location: [place];  Service: Orthopedics;  Laterality: Right;  . KNEE ARTHROSCOPY Left   . PATENT FORAMEN OVALE(PFO) CLOSURE N/A 01/31/2020   Procedure: PATENT FORAMEN OVALE (PFO) CLOSURE;  Surgeon: Sherren Mocha, MD;  Location: [place] [facility];  Service: Cardiovascular;  Laterality: N/A;  . SUBOCCIPITAL CRANIECTOMY CERVICAL LAMINECTOMY N/A 11/17/2019   Procedure: SUBOCCIPITAL DECOMPRESSIVE CRANIECTOMY;  Surgeon: Judith Part, MD;  Location: [place];  Service: Neurosurgery;  Laterality: N/A;  . TEE WITHOUT CARDIOVERSION N/A 11/21/2019   Procedure: TRANSESOPHAGEAL ECHOCARDIOGRAM (TEE);  Surgeon: Buford Dresser, MD;  Location: [facility];  Service: Cardiovascular;  Laterality: N/A;   Past Medical History:  Diagnosis Date  . Arthritis    neck  . Carpal tunnel syndrome on both sides 08/2014  . DDD (degenerative disc disease), lumbar   .  History of kidney stones   . Hypertension    under control with med., has been on med. x 1 yr.  . Sleep apnea    CPAP but does not use  . Stroke (HCC) 11/16/2019   BP 133/89   Pulse 85   Temp 98.1 F (36.7 C)   Ht 6\' 3"  (1.905 m)   Wt (!) 310 lb (140.6 kg)   SpO2 96%   BMI 38.75 kg/m   Opioid Risk Score:   Fall Risk Score:  `1  Depression screen PHQ 2/9  Depression screen [facility] 2/9 01/16/2020 12/18/2019  Decreased Interest 1 3  Down, Depressed, Hopeless 1 3  PHQ - 2 Score 2 6  Altered sleeping - 3  Tired, decreased energy - 3  Change in appetite - 2  Feeling bad or failure about yourself  - 3  Trouble concentrating - 2  Moving slowly or fidgety/restless - 3  Suicidal thoughts - 1  PHQ-9 Score - 23    Review of Systems  Constitutional: Negative.   HENT: Negative.   Eyes: Negative.   Respiratory: Negative.   Gastrointestinal: Negative.   Endocrine: Negative.   Genitourinary: Negative.   Musculoskeletal: Positive for arthralgias, back pain, gait  problem and myalgias.  Skin: Negative.   Allergic/Immunologic: Negative.   Hematological: Negative.   Psychiatric/Behavioral: Negative.   All other systems reviewed and are negative.      Objective:   Physical Exam Vitals and nursing note reviewed.  Constitutional:      Appearance: He is obese.  HENT:     Head: Normocephalic and atraumatic.  Eyes:     General: No visual field deficit.    Extraocular Movements: Extraocular movements intact.     Conjunctiva/sclera: Conjunctivae normal.     Pupils: Pupils are equal, round, and reactive to light.  Musculoskeletal:     Cervical back: Normal range of motion.  Neurological:     Mental Status: He is alert and oriented to person, place, and time.     Cranial Nerves: No dysarthria or facial asymmetry.     Motor: No abnormal muscle tone or seizure activity.     Coordination: Romberg sign positive. Finger-Nose-Finger Test abnormal. Rapid alternating movements normal.     Gait: Gait abnormal and tandem walk abnormal.     Comments: Extraocular muscles are intact, he does complain of some visual blurring when looking toward the temporal field bilaterally but no overt double vision. Motor strength is 5/5 bilateral deltoid bicep tricep grip hip flexor knee extensor left ankle dorsiflexor Right ankle dorsiflexors 0/5 There is minimal dysmetria finger-nose-finger testing left greater than right side. Finger thumb opposition is intact with the exception of the little finger which is limited by his anatomy and history of carpal tunnel  Ambulates without assistive device wide-based gait unable to perform tandem gait Patient is able to stand with his eyes open and his feet together but has increased sway.  He is unable to stand with his eyes closed   Psychiatric:        Attention and Perception: Attention normal.        Mood and Affect: Mood is anxious and depressed. Affect is flat.        Speech: Speech normal.        Cognition and Memory:  Cognition normal.           Assessment & Plan:  #1.  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  He is  making good progress with his therapy.  He is independent with his activities of daily living but still has significant balance problems which he is still working on with PT. He has undergone PFO closure which should greatly reduce recurrent stroke risk.  Continue outpatient PT, follow-up with neurology as well as neurosurgery  #2.  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  We discussed that his feelings of fatigue and cognitive slowing should improve with time and also as his general physical conditioning improves. We discussed avoiding medications that are used for symptomatic relief such as meclizine and carisoprodol as they have side effects of drowsiness   3.  Diffuse body pain, may have a fibromyalgia variant versus somatic pain related to depression.  In either case I think duloxetine would be indicated start at 30 mg daily may need to go up to 40 or 60 mg after a month discussed the side effect of nausea which usually is short-lived  Per neuropsych having GI issues with cymbalta, will trial celexa  4.  Vascular headache post stroke will resume topiramate will increase to 100 mg twice daily Physical medicine rehab follow-up in 1 month

## 2020-02-26 ENCOUNTER — Ambulatory Visit: Payer: 59

## 2020-02-26 ENCOUNTER — Other Ambulatory Visit: Payer: Self-pay

## 2020-02-26 DIAGNOSIS — M21371 Foot drop, right foot: Secondary | ICD-10-CM

## 2020-02-26 DIAGNOSIS — R278 Other lack of coordination: Secondary | ICD-10-CM

## 2020-02-26 DIAGNOSIS — M6281 Muscle weakness (generalized): Secondary | ICD-10-CM

## 2020-02-26 DIAGNOSIS — R2689 Other abnormalities of gait and mobility: Secondary | ICD-10-CM

## 2020-02-26 DIAGNOSIS — R2681 Unsteadiness on feet: Secondary | ICD-10-CM

## 2020-02-26 NOTE — Therapy (Signed)
[facility] [address] [place], [place], [address] Phone: [phone]   Fax:  [phone]  Physical Therapy Treatment  Patient Details  Name: Henry SchwartzMichael J [name] MRN: [id number] Date of Birth: Oct 23, 1970 Referring Provider (PT): Referred by: Mariam Dollaraniel Angiulli, [name], being followed by: Claudette LawsAndrew Kirsteins, MD   Encounter Date: 02/26/2020   PT End of Session - 02/26/20 0807    Visit Number 13    Number of Visits 24    Date for PT Re-Evaluation 05/20/20   POC for 6 weeks, cert for 90 days   Authorization Type [employer]    PT Start Time 0805   pt arriving late   PT Stop Time 0845    PT Time Calculation (min) 40 min    Equipment Utilized During Treatment Gait belt    Activity Tolerance Patient tolerated treatment well;No increased pain    Behavior During Therapy WFL for tasks assessed/performed           Past Medical History:  Diagnosis Date  . Arthritis    neck  . Carpal tunnel syndrome on both sides 08/2014  . DDD (degenerative disc disease), lumbar   . History of kidney stones   . Hypertension    under control with med., has been on med. x 1 yr.  . Sleep apnea    CPAP but does not use  . Stroke [facility](HCC) 11/16/2019    Past Surgical History:  Procedure Laterality Date  . BUBBLE STUDY  11/21/2019   Procedure: BUBBLE STUDY;  Surgeon: Jodelle Redhristopher, Bridgette, MD;  [facility];  Service: Cardiovascular;;  . CARPAL TUNNEL RELEASE Left 09/07/2014   Procedure: LEFT CARPAL TUNNEL RELEASE;  Surgeon: Dominica SeverinWilliam Gramig, MD;  Location: [facility];  Service: Orthopedics;  Laterality: Left;  . CARPAL TUNNEL RELEASE Right 10/12/2014   Procedure: RIGHT CARPAL TUNNEL RELEASE;  Surgeon: Dominica SeverinWilliam Gramig, MD;  Location: [facility];  Service: Orthopedics;  Laterality: Right;  . KNEE ARTHROSCOPY Left   . PATENT FORAMEN OVALE(PFO) CLOSURE N/A 01/31/2020   Procedure: PATENT FORAMEN OVALE (PFO) CLOSURE;   Surgeon: Tonny Bollmanooper, Chaden, MD;  [facility];  Service: Cardiovascular;  Laterality: N/A;  . SUBOCCIPITAL CRANIECTOMY CERVICAL LAMINECTOMY N/A 11/17/2019   Procedure: SUBOCCIPITAL DECOMPRESSIVE CRANIECTOMY;  Surgeon: Jadene Pierinistergard, Thomas A, MD;  [facility];  Service: Neurosurgery;  Laterality: N/A;  . TEE WITHOUT CARDIOVERSION N/A 11/21/2019   Procedure: TRANSESOPHAGEAL ECHOCARDIOGRAM (TEE);  Surgeon: Jodelle Redhristopher, Bridgette, MD;  [facility];  Service: Cardiovascular;  Laterality: N/A;    There were no vitals filed for this visit.   Subjective Assessment - 02/26/20 0807    Subjective Patient reports that patient was not feeling well over the weekend, but is feeling better now. Reports appointment with Dr. Wynn BankerKirsteins went well.    Patient is accompained by: Family member   Wife [name]   Pertinent History hypertension, hyperlipidemia, depression/anxiety, degenerative disc disease in lumbar region, obesity, sleep apnea    Limitations Walking;Standing    Patient Stated Goals Back to Normal, Do the things I did prior    Currently in Pain? Yes    Pain Score 7     Pain Location Generalized    Pain Orientation Other (Comment)   All Over   Pain Descriptors / Indicators Aching;Sore    Pain Type Acute pain    Pain Onset More than a month ago  [facility] Adult PT Treatment/Exercise - 02/26/20 0001      Ambulation/Gait   Ambulation/Gait Yes    Ambulation/Gait Assistance 5: Supervision;4: Min guard    Ambulation/Gait Assistance Details completed ambulation x 400 ft without AD, and supervisoin from PT. verbal cues for improved heel toe pattern. Patient not ambulating with AFO donned.     Ambulation Distance (Feet) 400 Feet    Assistive device None    Gait Pattern Step-through pattern;Decreased step length - left;Decreased stance time - right;Decreased dorsiflexion - right;Right steppage;Wide base of support;Poor foot clearance - right     Ambulation Surface Level;Indoor      High Level Balance   High Level Balance Activities Head turns;Turns    High Level Balance Comments Completed ambulation with horizontal/vertical head turns 4 x 50' each. Increased challenge continue to be noted with horizontal > often requiring CGA to maintain balance. In // bars: completed 180 deg turns to R/L with 2-3 steps between, increase time required to turn. Patient demo increased difficulty with turning to L and more imbalance noted requiring UE support at times and CGA from PT. Completed x 6 turns each direction.       Neuro Re-ed    Neuro Re-ed Details  Seated on green air disc completed the following exercises: B shoulder flexion x 5 reps, alternating knee extension x 10 reps. Completed alteranting marching with BLE x 10 reps, and progressed to opposite shoulder/hip flexion x 10 reps. Increased challenge noted with completion of UE and LE use requiring UE use at times for imbalance. PT providing verbal cues for improved core contraction with completion.                Balance Exercises - 02/26/20 0001      Balance Exercises: Standing   Rockerboard Anterior/posterior;Lateral;Head turns;Intermittent UE support;Limitations    Rockerboard Limitations standing on rockerboard positioned ant/post x 3 minutes with focus on holding steady without UE support, progressed to addition of vertical head turns x 10 reps, slow pace. With board positioned laterally x 3 minutes with holding steady, progressed to additoin of horizontal head turns x 10 reps each direction. Increased balance challenge noted with vertical/horizontal head turns with imbalance noted, requiring UE support and CGA from PT.                PT Short Term Goals - 02/20/20 1329      PT SHORT TERM GOAL #1   Title STG = LTG             PT Long Term Goals - 02/20/20 1241      PT LONG TERM GOAL #1   Title Patient will be independent with progressive HEP for  balance/coordination (All LTG's due: 04/02/20)    Baseline continue to progress HEP    Time 6    Period Weeks    Status Revised    Target Date 04/02/20      PT LONG TERM GOAL #2   Title Patient will demonstrate ability to ambulate >1000 on indoor/outdoor surfaces without AD, and supervision for improved community mobility    Baseline 1100 ft with supervision, CGA intermittent due to imbalance with turns.    Time 6    Period Weeks    Status Revised      PT LONG TERM GOAL #3   Title Patient will demo ability to ambulate > 115 ft with scanning environment and no imbalance to demonstrate improved community negotiation    Baseline imbalance with head turns  Time 6    Period Weeks    Status Revised      PT LONG TERM GOAL #4   Title Patient will improve Berg Balance to >/= 50/56 to demonstrate reduced fall risk and improved balance    Baseline 45/56    Time 6    Period Weeks    Status Revised      PT LONG TERM GOAL #5   Title --    Baseline --    Time --    Period --    Status --      PT LONG TERM GOAL #6   Title --    Baseline --    Time --    Period --    Status --                 Plan - 02/26/20 0855    Clinical Impression Statement Today's skilled session included continued gait training with additoin of horizontal/vertical head turns. Initiated work on 180 degs turns without UE support and good speed to further challenge balance, as well as continue working on improved trunk control throughout session. Will continue to progress toward all goals.    Personal Factors and Comorbidities Comorbidity 3+    Comorbidities hypertension, hyperlipidemia, depression/anxiety, degenerative disc disease in lumbar region, obesity, sleep apnea    Examination-Activity Limitations Bend;Transfers;Stairs;Stand;Lift    Examination-Participation Restrictions Occupation;Yard Work;Community Activity;Driving    Stability/Clinical Decision Making Evolving/Moderate complexity    Rehab  Potential Good    PT Frequency 2x / week    PT Duration 6 weeks    PT Treatment/Interventions ADLs/Self Care Home Management;Aquatic Therapy;Cryotherapy;Electrical Stimulation;Moist Heat;DME Instruction;Gait training;Stair training;Functional mobility training;Therapeutic activities;Therapeutic exercise;Balance training;Neuromuscular re-education;Patient/family education;Orthotic Fit/Training;Manual techniques;Passive range of motion;Vestibular    PT Next Visit Plan Balance Exercises. Working on head turns, full body turns. Slowly incorporating vision removed, retropulsion noted with EC.    Consulted and Agree with Plan of Care Patient;Family member/caregiver    Family Member Consulted Wife           Patient will benefit from skilled therapeutic intervention in order to improve the following deficits and impairments:  Abnormal gait, Decreased balance, Decreased endurance, Difficulty walking, Impaired sensation, Dizziness, Decreased range of motion, Decreased activity tolerance, Decreased knowledge of use of DME, Decreased strength, Pain  Visit Diagnosis: Muscle weakness (generalized)  Unsteadiness on feet  Other abnormalities of gait and mobility  Other lack of coordination  Foot drop, right     Problem List Patient Active Problem List   Diagnosis Date Noted  . Transaminitis   . PFO with atrial septal aneurysm 11/22/2019  . Headache 11/22/2019  . Cerebral edema (HCC) 11/22/2019  . Chest pain 11/22/2019  . Hyperlipidemia LDL goal <70 11/22/2019  . OSA (obstructive sleep apnea) 11/22/2019  . 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 Vascular headache   . Dyslipidemia   . Hypertension   . Intractable nausea and vomiting   . Hypokalemia   . Depression with anxiety   . Ischemic stroke (HCC) - L PICA infarct embolic in setting of PFO s/p crani   . Lumbar spondylolysis 07/31/2019  . Lumbar radiculopathy 04/07/2019  . Right foot drop  04/07/2019  . Backache 07/05/2017  . DDD (degenerative disc disease), lumbar 07/05/2017  . Spinal stenosis of lumbar region 07/05/2017    Tempie Donning, PT, DPT 02/26/2020, 8:57 AM  [facility] [address] [place], [place], [address] Phone:  [phone]   Fax:  [phone]  Name: BRANDIS MATSUURA MRN: [id number] Date of Birth: 1971-02-07

## 2020-02-27 ENCOUNTER — Other Ambulatory Visit: Payer: Self-pay | Admitting: Physical Medicine & Rehabilitation

## 2020-02-28 ENCOUNTER — Ambulatory Visit (INDEPENDENT_AMBULATORY_CARE_PROVIDER_SITE_OTHER): Payer: 59 | Admitting: Physician Assistant

## 2020-02-28 ENCOUNTER — Ambulatory Visit: Payer: 59

## 2020-02-28 ENCOUNTER — Encounter: Payer: Self-pay | Admitting: Physician Assistant

## 2020-02-28 ENCOUNTER — Other Ambulatory Visit: Payer: Self-pay

## 2020-02-28 ENCOUNTER — Other Ambulatory Visit: Payer: Self-pay | Admitting: Physician Assistant

## 2020-02-28 VITALS — BP 122/88 | HR 80 | Ht 75.0 in | Wt 310.0 lb

## 2020-02-28 DIAGNOSIS — R2681 Unsteadiness on feet: Secondary | ICD-10-CM

## 2020-02-28 DIAGNOSIS — R319 Hematuria, unspecified: Secondary | ICD-10-CM

## 2020-02-28 DIAGNOSIS — Z8774 Personal history of (corrected) congenital malformations of heart and circulatory system: Secondary | ICD-10-CM | POA: Diagnosis not present

## 2020-02-28 DIAGNOSIS — R002 Palpitations: Secondary | ICD-10-CM

## 2020-02-28 DIAGNOSIS — M6281 Muscle weakness (generalized): Secondary | ICD-10-CM

## 2020-02-28 DIAGNOSIS — R2689 Other abnormalities of gait and mobility: Secondary | ICD-10-CM

## 2020-02-28 DIAGNOSIS — M21371 Foot drop, right foot: Secondary | ICD-10-CM

## 2020-02-28 MED ORDER — CLOPIDOGREL BISULFATE 75 MG PO TABS: 75.0000 mg | ORAL_TABLET | Freq: Every day | ORAL | 3 refills | Status: AC

## 2020-02-28 NOTE — Progress Notes (Signed)
[facility]   MULTIDISCIPLINARY HEART VALVE CLINIC                                       Cardiology Office Note    Date:  02/28/2020   ID:  Henry SchwartzMichael J Monterosso, DOB 1970-10-16, MRN [id number]  PCP:  Sigmund HazelMiller, Lisa, MD  Cardiologist:  Dr. Excell Seltzerooper   CC: 1 month s/p PFO closure   History of Present Illness:  [name] is a 49 y.o. male with a history of CVA and PFO s/p percutaneous PFO closure (01/31/20) who presents to clinic for follow up.  The patient was in his normal state of health on 11/16/19 when he developed dizziness and gait abnormality, prompting ER evaluation. He was diagnosed with a left PICA territory infarction and a questionable punctate infarct in the left occipital lobe. CTA studies showed no large vessel atherosclerosis or stenosis. Echo was normal. TCD bubble study was positive for Spencer grade 2-3 shunt at rest. TEE showed a positive bubble study but no clear color flow across the interatrial septum. The patient ultimately developed obstructive hydrocephalus and underwent suboccipital craniotomy on 7/2. He ultimately went to [facility] and then discharged home.   He was seen by Dr. Excell Seltzerooper for consultation of percutaneous PFO closure on 12/20/19. The patient is a Psychologist, occupationalwelder and there was some question about CO2 exposure contributing to his CVA. Plans were made for continue with his current medical therapy, work with physical and occupational therapy, and follow-up with Dr. Pearlean BrownieSethi as scheduled in September. If he decided he wanted PFO closure, Dr. Excell Seltzerooper would perform a careful intracardiac echo study with agitated saline to confirm the presence of a clinically significant PFO and close at that time, if present.  [facility]. Another TCD bubble study was performed which was reportedly very abnormal.  The patient then called back to to schedule PFO closure.  He underwent successful transcatheter PFO closure using a 25 mm Amplatzer  PFO Occluder under ICE and fluoroscopic guidance on 01/31/20. Post op limited echo showed normally functioning/placed Amplatzer occluder device. He was discharged home on aspirin and plavix x 6 months.   Today he presents to clinic for follow up. No CP or SOB. Has has a little anxiety with some tighntess in his chest and occassionally palitations. No LE edema, orthopnea or PND. No dizziness or syncope. No blood in stool but has a history of kidney stones and had pain with some blood in urine. The pain he had was typical of his kidney stones.   Past Medical History:  Diagnosis Date  . Arthritis    neck  . Carpal tunnel syndrome on both sides 08/2014  . DDD (degenerative disc disease), lumbar   . History of kidney stones   . Hypertension    under control with med., has been on med. x 1 yr.  . Sleep apnea    CPAP but does not use  . Stroke [facility](HCC) 11/16/2019    Past Surgical History:  Procedure Laterality Date  . BUBBLE STUDY  11/21/2019   Procedure: BUBBLE STUDY;  Surgeon: Jodelle Redhristopher, Bridgette, MD;  [facility];  Service: Cardiovascular;;  . CARPAL TUNNEL RELEASE Left 09/07/2014   Procedure: LEFT CARPAL TUNNEL RELEASE;  Surgeon: Dominica SeverinWilliam Gramig, MD;  Location: [facility];  Service: Orthopedics;  Laterality: Left;  . CARPAL TUNNEL RELEASE  Right 10/12/2014   Procedure: RIGHT CARPAL TUNNEL RELEASE;  Surgeon: [name], MD;  Location: [facility];  Service: Orthopedics;  Laterality: Right;  . KNEE ARTHROSCOPY Left   . PATENT FORAMEN OVALE(PFO) CLOSURE N/A 01/31/2020   Procedure: PATENT FORAMEN OVALE (PFO) CLOSURE;  Surgeon: [name], MD;  Location: [facility] INVASIVE CV LAB;  Service: Cardiovascular;  Laterality: N/A;  . SUBOCCIPITAL CRANIECTOMY CERVICAL LAMINECTOMY N/A 11/17/2019   Procedure: SUBOCCIPITAL DECOMPRESSIVE CRANIECTOMY;  Surgeon: [name], MD;  Location: [facility] OR;  Service: Neurosurgery;  Laterality: N/A;  . TEE WITHOUT CARDIOVERSION  N/A 11/21/2019   Procedure: TRANSESOPHAGEAL ECHOCARDIOGRAM (TEE);  Surgeon: [name], MD;  Location: [facility] ENDOSCOPY;  Service: Cardiovascular;  Laterality: N/A;    Current Medications: Outpatient Medications Prior to Visit  Medication Sig Dispense Refill  . aspirin EC 81 MG EC tablet Take 1 tablet (81 mg total) by mouth daily. Swallow whole. 30 tablet 11  . atorvastatin (LIPITOR) 10 MG tablet Take 10 mg by mouth daily.    . butalbital-acetaminophen-caffeine (FIORICET) 50-325-40 MG tablet Take 2 tablets by mouth every 6 (six) hours as needed for headache. 14 tablet 0  . carisoprodol (SOMA) 350 MG tablet Take 350 mg by mouth 4 (four) times daily as needed (pain).     . clonazePAM (KLONOPIN) 0.5 MG tablet Take 0.5 mg by mouth at bedtime.     . clopidogrel (PLAVIX) 75 MG tablet Take 1 tablet (75 mg total) by mouth daily. 90 tablet 3  . cyanocobalamin (,VITAMIN B-12,) 1000 MCG/ML injection Inject 1,000 mcg into the muscle once a week.    . DULoxetine (CYMBALTA) 30 MG capsule Take 1 capsule (30 mg total) by mouth daily. 30 capsule 1  . losartan (COZAAR) 50 MG tablet Take 50 mg by mouth daily.    . meclizine (ANTIVERT) 25 MG tablet TAKE 1 TABLET(25 MG) BY MOUTH THREE TIMES DAILY AS NEEDED FOR DIZZINESS OR NAUSEA 30 tablet 0  . Phenylephrine HCl (SINEX REGULAR NA) Place 1 spray into both nostrils daily.    Marland Kitchen topiramate (TOPAMAX) 100 MG tablet Take 1 tablet (100 mg total) by mouth 2 (two) times daily. 60 tablet 2  . escitalopram (LEXAPRO) 10 MG tablet Take 10 mg by mouth daily. (Patient not taking: Reported on 02/28/2020)     No facility-administered medications prior to visit.     Allergies:   Patient has no known allergies.   Social History   Socioeconomic History  . Marital status: Married    Spouse name: [name]  . Number of children: Not on file  . Years of education: Not on file  . Highest education level: Not on file  Occupational History  . Not on file  Tobacco Use  .  Smoking status: Never Smoker  . Smokeless tobacco: Never Used  Vaping Use  . Vaping Use: Former  Substance and Sexual Activity  . Alcohol use: Not Currently    Comment: quit 2006  . Drug use: No  . Sexual activity: Yes  Other Topics Concern  . Not on file  Social History Narrative   Right handed    Lives with wife    Social Determinants of Health   Financial Resource Strain:   . Difficulty of Paying Living Expenses: Not on file  Food Insecurity:   . Worried About Programme researcher, broadcasting/film/video in the Last Year: Not on file  . Ran Out of Food in the Last Year: Not on file  Transportation Needs:   .  Lack of Transportation (Medical): Not on file  . Lack of Transportation (Non-Medical): Not on file  Physical Activity:   . Days of Exercise per Week: Not on file  . Minutes of Exercise per Session: Not on file  Stress:   . Feeling of Stress : Not on file  Social Connections:   . Frequency of Communication with Friends and Family: Not on file  . Frequency of Social Gatherings with Friends and Family: Not on file  . Attends Religious Services: Not on file  . Active Member of Clubs or Organizations: Not on file  . Attends Banker Meetings: Not on file  . Marital Status: Not on file     Family History:  The patient's family history includes Heart attack in his mother.     ROS:   Please see the history of present illness.    ROS All other systems reviewed and are negative.   PHYSICAL EXAM:   VS:  BP 122/88   Pulse 80   Ht 6\' 3"  (1.905 m)   Wt (!) 310 lb (140.6 kg)   SpO2 95%   BMI 38.75 kg/m    GEN: Well nourished, well developed, in no acute distress HEENT: normal Neck: no JVD or masses Cardiac: RRR; no murmurs, rubs, or gallops,no edema  Respiratory:  clear to auscultation bilaterally, normal work of breathing GI: soft, nontender, nondistended, + BS MS: no deformity or atrophy Skin: warm and dry, no rash Neuro:  Alert and Oriented x 3, Strength and sensation are  intact Psych: euthymic mood, full affect   Wt Readings from Last 3 Encounters:  02/28/20 (!) 310 lb (140.6 kg)  02/23/20 (!) 310 lb (140.6 kg)  01/31/20 (!) 310 lb (140.6 kg)      Studies/Labs Reviewed:   EKG:  EKG is NOT ordered today.   Recent Labs: 11/16/2019: Magnesium 1.7; TSH 1.525 12/04/2019: ALT 206 01/24/2020: BUN 11; Creatinine, Ser 0.98; Hemoglobin 14.9; Platelets 235; Potassium 4.5; Sodium 143   Lipid Panel    Component Value Date/Time   CHOL 181 11/16/2019 1623   TRIG 122 11/20/2019 0436   HDL 40 (L) 11/16/2019 1623   CHOLHDL 4.5 11/16/2019 1623   VLDL 7 11/16/2019 1623   LDLCALC 134 (H) 11/16/2019 1623    Additional studies/ records that were reviewed today include:  01/31/20 PATENT FORAMEN OVALE (PFO) CLOSURE  Conclusion  Successful transcatheter PFO closure using a 25 mm Amplatzer PFO Occluder under ICE and fluoroscopic guidance Recommendations  Antiplatelet/Anticoag Recommend uninterrupted dual antiplatelet therapy with Aspirin 81mg  daily and Clopidogrel 75mg  daily for a minimum of 6 months (stable ischemic heart disease-Class I recommendation).   ___________________  Echo 01/31/20 IMPRESSIONS 1. Left ventricular ejection fraction, by estimation, is 60 to 65%. The  left ventricle has normal function. The left ventricle has no regional  wall motion abnormalities.  2. Atrial septal occluder device is in place.  3. The mitral valve is normal in structure. No evidence of mitral valve  regurgitation. No evidence of mitral stenosis.  4. The aortic valve is normal in structure. Aortic valve regurgitation is  not visualized.  5. Aortic dilatation noted. There is mild to moderate dilatation of the  ascending aorta, measuring 39 mm.   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 Groin site healing well. Continue on aspirin and Plavix x 6 months. He can stop plavix 07/30/20. He understands the need for SBE  prophylaxis x 6 months.  I will see him back in 1 year with an echo/bubble.   Hematuria: has a history of kidney stones with hematuria. Had an episode typical of pain with kidney stones with some blood in urine. Bleeding probably exacerbated by Plavix. He will follow up with urology if he continues to have this.  Palpitations: pt has had some moments of feeling like his heart racing. We discussed the 1% incidence of afib after PFO closure and associated stroke risk. He thinks it's just anxiety but will continue to monitor. I offered him an ambulatory monitor, but he declines at this time.    Medication Adjustments/Labs and Tests Ordered: Current medicines are reviewed at length with the patient today.  Concerns regarding medicines are outlined above.  Medication changes, Labs and Tests ordered today are listed in the Patient Instructions below. There are no Patient Instructions on file for this visit.   Signed, Cline Crock, [name]  02/28/2020 3:15 PM    [facility] [address] [place], [place], [place]  [address] Phone: [phone]; Fax: [phone]

## 2020-02-28 NOTE — Addendum Note (Signed)
Addended by: Lendon Ka on: 02/28/2020 03:27 PM   Modules accepted: Orders

## 2020-02-28 NOTE — Patient Instructions (Signed)
Medication Instructions:  Stop Plavix on 07/30/2020  *If you need a refill on your cardiac medications before your next appointment, please call your pharmacy*   Lab Work: none If you have labs (blood work) drawn today and your tests are completely normal, you will receive your results only by: Marland Kitchen MyChart Message (if you have MyChart) OR . A paper copy in the mail If you have any lab test that is abnormal or we need to change your treatment, we will call you to review the results.   Testing/Procedures: BUBBLE STUDY Your physician has requested that you have an echocardiogram. Echocardiography is a painless test that uses sound waves to create images of your heart. It provides your doctor with information about the size and shape of your heart and how well your heart's chambers and valves are working. This procedure takes approximately one hour. There are no restrictions for this procedure.    Follow-Up: At [facility], you and your health needs are our priority.  As part of our continuing mission to provide you with exceptional heart care, we have created designated Provider Care Teams.  These Care Teams include your primary Cardiologist (physician) and Advanced Practice Providers (APPs -  Physician Assistants and Nurse Practitioners) who all work together to provide you with the care you need, when you need it.    Your next appointment:   12 month(s)  The format for your next appointment:   In Person  Provider:   Carlean Jews, [name]   Other Instructions

## 2020-02-28 NOTE — Therapy (Signed)
[facility] [address] [place], [place], [address] Phone: [phone]   Fax:  [phone]  Physical Therapy Treatment  Patient Details  Name: [name] MRN: [id number] Date of Birth: 10/06/1970 Referring Provider (PT): [name], [name], [name], MD   Encounter Date: 02/28/2020   PT End of Session - 02/28/20 0849    Visit Number 14    Number of Visits 24    Date for PT Re-Evaluation 05/20/20   POC for 6 weeks, cert for 90 days   Authorization Type [employer]    PT Start Time [phone]    PT Stop Time [phone]    PT Time Calculation (min) 41 min    Equipment Utilized During Treatment Gait belt    Activity Tolerance Patient tolerated treatment well;No increased pain    Behavior During Therapy WFL for tasks assessed/performed           Past Medical History:  Diagnosis Date  . Arthritis    neck  . Carpal tunnel syndrome on both sides 08/2014  . DDD (degenerative disc disease), lumbar   . History of kidney stones   . Hypertension    under control with med., has been on med. x 1 yr.  . Sleep apnea    CPAP but does not use  . Stroke [facility]) 11/16/2019    Past Surgical History:  Procedure Laterality Date  . BUBBLE STUDY  11/21/2019   Procedure: BUBBLE STUDY;  Surgeon: [name], MD;  Location: [facility] ENDOSCOPY;  Service: Cardiovascular;;  . CARPAL TUNNEL RELEASE Left 09/07/2014   Procedure: LEFT CARPAL TUNNEL RELEASE;  Surgeon: [name], MD;  Location: [facility];  Service: Orthopedics;  Laterality: Left;  . CARPAL TUNNEL RELEASE Right 10/12/2014   Procedure: RIGHT CARPAL TUNNEL RELEASE;  Surgeon: [name], MD;  Location: [facility];  Service: Orthopedics;  Laterality: Right;  . KNEE ARTHROSCOPY Left   . PATENT FORAMEN OVALE(PFO) CLOSURE N/A 01/31/2020   Procedure: PATENT FORAMEN OVALE (PFO) CLOSURE;  Surgeon: [name], MD;  Location: [facility] INVASIVE CV LAB;  Service: Cardiovascular;  Laterality: N/A;  . SUBOCCIPITAL CRANIECTOMY CERVICAL LAMINECTOMY N/A 11/17/2019   Procedure: SUBOCCIPITAL DECOMPRESSIVE CRANIECTOMY;  Surgeon: [name], MD;  Location: [facility] OR;  Service: Neurosurgery;  Laterality: N/A;  . TEE WITHOUT CARDIOVERSION N/A 11/21/2019   Procedure: TRANSESOPHAGEAL ECHOCARDIOGRAM (TEE);  Surgeon: [name], MD;  Location: [facility] ENDOSCOPY;  Service: Cardiovascular;  Laterality: N/A;    There were no vitals filed for this visit.   Subjective Assessment - 02/28/20 0850    Subjective Patient no new complaints since last visit.    Patient is accompained by: Family member   Wife [name]   Pertinent History hypertension, hyperlipidemia, depression/anxiety, degenerative disc disease in lumbar region, obesity, sleep apnea    Limitations Walking;Standing    Patient Stated Goals Back to Normal, Do the things I did prior    Currently in Pain? Yes    Pain Score 8     Pain Location Generalized    Pain Orientation Other (Comment)    Pain Descriptors / Indicators Aching;Sore    Pain Type Acute pain    Pain Onset More than a month ago                    [facility] Adult PT Treatment/Exercise - 02/28/20 0001      Ambulation/Gait   Ambulation/Gait Yes  Ambulation/Gait Assistance 5: Supervision    Ambulation/Gait Assistance Details completed gait training without AD. no AFO donned. Progressed to completing ambulation with horizontal head turns x 230 ft to promote scanning environment. patient reports disorientation with head turns, requriing intermittent stopping and CGA from PT.     Ambulation Distance (Feet) 230 Feet   x 2   Assistive device None    Gait Pattern Step-through pattern;Decreased step length - left;Decreased stance time - right;Decreased dorsiflexion - right;Right steppage;Wide base of support;Poor foot clearance - right    Ambulation Surface Level;Indoor      High  Level Balance   High Level Balance Activities Turns    High Level Balance Comments In // bars completed 180 deg body turns to R/L x 5 reps each direction, progressed to completed full 360 deg turns to R/L x 3 each. Increased challenge noted with turning to L and patient reports feeling imbalance and dizzy with completion. seated rest breaks required throughout completion.       Neuro Re-ed    Neuro Re-ed Details  Standing without UE support completed visual tracking with ball in horizontal/vertical direction x 10 reps in each direction, increased dizziness with horizontal tracking noted. Progressed to completed clockwise circles x 10 reps, increased dysequilibrium noted with completion of circles, require CGA from PT. Patient needing to sit after completion due to queasy feeling in stomach, symptoms resolved with seated break. HR; 79 after completion.                Balance Exercises - 02/28/20 0001      Balance Exercises: Standing   Standing Eyes Opened Narrow base of support (BOS);Head turns;Limitations    Standing Eyes Opened Limitations standing with feet together completed horizontal head turns 1 x 15 reps.                PT Short Term Goals - 02/20/20 1329      PT SHORT TERM GOAL #1   Title STG = LTG             PT Long Term Goals - 02/20/20 1241      PT LONG TERM GOAL #1   Title Patient will be independent with progressive HEP for balance/coordination (All LTG's due: 04/02/20)    Baseline continue to progress HEP    Time 6    Period Weeks    Status Revised    Target Date 04/02/20      PT LONG TERM GOAL #2   Title Patient will demonstrate ability to ambulate >1000 on indoor/outdoor surfaces without AD, and supervision for improved community mobility    Baseline 1100 ft with supervision, CGA intermittent due to imbalance with turns.    Time 6    Period Weeks    Status Revised      PT LONG TERM GOAL #3   Title Patient will demo ability to ambulate > 115 ft  with scanning environment and no imbalance to demonstrate improved community negotiation    Baseline imbalance with head turns    Time 6    Period Weeks    Status Revised      PT LONG TERM GOAL #4   Title Patient will improve Berg Balance to >/= 50/56 to demonstrate reduced fall risk and improved balance    Baseline 45/56    Time 6    Period Weeks    Status Revised      PT LONG TERM GOAL #5   Title --  Baseline --    Time --    Period --    Status --      PT LONG TERM GOAL #6   Title --    Baseline --    Time --    Period --    Status --                 Plan - 02/28/20 0911    Clinical Impression Statement Continued focus of therapy session incorporating habituation exercises and visual tracking with standing activites. Increased dizziness and nauseous feeling ntoed with completion of some activites. Seated rest break required. Vital response normal. Will conitnue to progress.    Personal Factors and Comorbidities Comorbidity 3+    Comorbidities hypertension, hyperlipidemia, depression/anxiety, degenerative disc disease in lumbar region, obesity, sleep apnea    Examination-Activity Limitations Bend;Transfers;Stairs;Stand;Lift    Examination-Participation Restrictions Occupation;Yard Work;Community Activity;Driving    Stability/Clinical Decision Making Evolving/Moderate complexity    Rehab Potential Good    PT Frequency 2x / week    PT Duration 6 weeks    PT Treatment/Interventions ADLs/Self Care Home Management;Aquatic Therapy;Cryotherapy;Electrical Stimulation;Moist Heat;DME Instruction;Gait training;Stair training;Functional mobility training;Therapeutic activities;Therapeutic exercise;Balance training;Neuromuscular re-education;Patient/family education;Orthotic Fit/Training;Manual techniques;Passive range of motion;Vestibular    PT Next Visit Plan Habituation Exercises. Balance Exercises. Working on head turns, full body turns. Slowly incorporating vision removed,  retropulsion noted with EC.    Consulted and Agree with Plan of Care Patient;Family member/caregiver    Family Member Consulted Wife           Patient will benefit from skilled therapeutic intervention in order to improve the following deficits and impairments:  Abnormal gait, Decreased balance, Decreased endurance, Difficulty walking, Impaired sensation, Dizziness, Decreased range of motion, Decreased activity tolerance, Decreased knowledge of use of DME, Decreased strength, Pain  Visit Diagnosis: Muscle weakness (generalized)  Unsteadiness on feet  Other abnormalities of gait and mobility  Foot drop, right     Problem List Patient Active Problem List   Diagnosis Date Noted  . Transaminitis   . PFO with atrial septal aneurysm 11/22/2019  . Headache 11/22/2019  . Cerebral edema (HCC) 11/22/2019  . Chest pain 11/22/2019  . Hyperlipidemia LDL goal <70 11/22/2019  . OSA (obstructive sleep apnea) 11/22/2019  . 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 Vascular headache   . Dyslipidemia   . Hypertension   . Intractable nausea and vomiting   . Hypokalemia   . Depression with anxiety   . Ischemic stroke (HCC) - L PICA infarct embolic in setting of PFO s/p crani   . Lumbar spondylolysis 07/31/2019  . Lumbar radiculopathy 04/07/2019  . Right foot drop 04/07/2019  . Backache 07/05/2017  . DDD (degenerative disc disease), lumbar 07/05/2017  . Spinal stenosis of lumbar region 07/05/2017    Tempie Donning, PT, DPT  02/28/2020, 11:22 AM  [facility] [address] [place], [place], [address] Phone: [phone]   Fax:  [phone]  Name: [name] MRN: [id number] Date of Birth: Jun 12, 1970

## 2020-03-04 ENCOUNTER — Ambulatory Visit: Payer: 59

## 2020-03-06 ENCOUNTER — Ambulatory Visit: Payer: 59

## 2020-03-11 ENCOUNTER — Ambulatory Visit: Payer: 59

## 2020-03-13 ENCOUNTER — Ambulatory Visit: Payer: 59

## 2020-03-18 ENCOUNTER — Other Ambulatory Visit: Payer: Self-pay

## 2020-03-18 ENCOUNTER — Other Ambulatory Visit: Payer: Self-pay | Admitting: Physical Medicine & Rehabilitation

## 2020-03-18 ENCOUNTER — Ambulatory Visit: Payer: 59 | Attending: Physician Assistant

## 2020-03-18 DIAGNOSIS — R2681 Unsteadiness on feet: Secondary | ICD-10-CM | POA: Diagnosis present

## 2020-03-18 DIAGNOSIS — M21371 Foot drop, right foot: Secondary | ICD-10-CM | POA: Diagnosis present

## 2020-03-18 DIAGNOSIS — R278 Other lack of coordination: Secondary | ICD-10-CM | POA: Insufficient documentation

## 2020-03-18 DIAGNOSIS — R2689 Other abnormalities of gait and mobility: Secondary | ICD-10-CM | POA: Diagnosis present

## 2020-03-18 DIAGNOSIS — M6281 Muscle weakness (generalized): Secondary | ICD-10-CM | POA: Insufficient documentation

## 2020-03-18 NOTE — Therapy (Signed)
[facility] [address] [place], [place], [address] Phone: [phone]   Fax:  [phone]  Physical Therapy Treatment  Patient Details  Name: [name] MRN: [id number] Date of Birth: 1970/10/16 Referring Provider (PT): Referred by: Mariam Dollaraniel Angiulli, [name], being followed by: Claudette LawsAndrew Kirsteins, MD   Encounter Date: 03/18/2020   PT End of Session - 03/18/20 0850    Visit Number 15    Number of Visits 24    Date for PT Re-Evaluation 05/20/20   POC for 6 weeks, cert for 90 days   Authorization Type [employer]    PT Start Time [id number]    PT Stop Time 0930    PT Time Calculation (min) 43 min    Equipment Utilized During Treatment Gait belt    Activity Tolerance Patient tolerated treatment well;No increased pain    Behavior During Therapy WFL for tasks assessed/performed           Past Medical History:  Diagnosis Date  . Arthritis    neck  . Carpal tunnel syndrome on both sides 08/2014  . DDD (degenerative disc disease), lumbar   . History of kidney stones   . Hypertension    under control with med., has been on med. x 1 yr.  . Sleep apnea    CPAP but does not use  . Stroke [facility](HCC) 11/16/2019    Past Surgical History:  Procedure Laterality Date  . BUBBLE STUDY  11/21/2019   Procedure: BUBBLE STUDY;  Surgeon: Jodelle Redhristopher, Bridgette, MD;  [facility];  Service: Cardiovascular;;  . CARPAL TUNNEL RELEASE Left 09/07/2014   Procedure: LEFT CARPAL TUNNEL RELEASE;  Surgeon: Dominica SeverinWilliam Gramig, MD;  Location: [facility];  Service: Orthopedics;  Laterality: Left;  . CARPAL TUNNEL RELEASE Right 10/12/2014   Procedure: RIGHT CARPAL TUNNEL RELEASE;  Surgeon: Dominica SeverinWilliam Gramig, MD;  Location:  SURGERY CENTER;  Service: Orthopedics;  Laterality: Right;  . KNEE ARTHROSCOPY Left   . PATENT FORAMEN OVALE(PFO) CLOSURE N/A 01/31/2020   Procedure: PATENT FORAMEN OVALE (PFO) CLOSURE;  Surgeon: Tonny Bollmanooper,  Reiner, MD;  [facility];  Service: Cardiovascular;  Laterality: N/A;  . SUBOCCIPITAL CRANIECTOMY CERVICAL LAMINECTOMY N/A 11/17/2019   Procedure: SUBOCCIPITAL DECOMPRESSIVE CRANIECTOMY;  Surgeon: Jadene Pierinistergard, Thomas A, MD;  Location: [facility];  Service: Neurosurgery;  Laterality: N/A;  . TEE WITHOUT CARDIOVERSION N/A 11/21/2019   Procedure: TRANSESOPHAGEAL ECHOCARDIOGRAM (TEE);  Surgeon: Jodelle Redhristopher, Bridgette, MD;  [facility];  Service: Cardiovascular;  Laterality: N/A;    There were no vitals filed for this visit.   Subjective Assessment - 03/18/20 0851    Subjective Patient no new complaints since last visit. Patient returns after beign on vacation for 1-2 weeks. Patient reports no falls or near falls. Still has feeling of being disoriented with crowded environments, as it can be overwhelming.    Patient is accompained by: Family member   Wife [name]   Pertinent History hypertension, hyperlipidemia, depression/anxiety, degenerative disc disease in lumbar region, obesity, sleep apnea    Limitations Walking;Standing    Patient Stated Goals Back to Normal, Do the things I did prior    Currently in Pain? Yes    Pain Score 8     Pain Location Back    Pain Orientation Lower    Pain Descriptors / Indicators Aching;Sore    Pain Type Chronic pain    Pain Onset More than a month ago  [facility] Adult PT Treatment/Exercise - 03/18/20 0934      Ambulation/Gait   Ambulation/Gait Yes    Ambulation/Gait Assistance 5: Supervision    Ambulation/Gait Assistance Details completed ambulation x 200 ft with no AD. improved step length noted with ambulation.     Ambulation Distance (Feet) 200 Feet    Assistive device None    Gait Pattern Step-through pattern;Decreased step length - left;Decreased stance time - right;Decreased dorsiflexion - right;Right steppage;Wide base of support;Poor foot clearance - right    Ambulation Surface Level;Indoor      High Level  Balance   High Level Balance Activities Head turns    High Level Balance Comments completed ambulation x 100 ft with horizontal/vertical head turns in hallway. CGA required with vertical head turns.           Completed entire review of HEP. Patient demonstrating improved strength at this time, but continues to demo decreased balance. Updated HEP to further address balance deficits.   Prior HEP:  Access Code: [email or web address] URL: [email or web address] Date: 01/03/2020 Prepared by: Jethro Bastos  Exercises Seated March with Resistance - 1-2 x daily - 7 x weekly - 2 sets - 10 reps Seated Hip Abduction with Resistance - 1-2 x daily - 7 x weekly - 2 sets - 10 reps Seated Hip Adduction Isometrics with Ball - 1-2 x daily - 7 x weekly - 2 sets - 10 reps Seated Long Arc Quad - 1-2 x daily - 7 x weekly - 2 sets - 10 reps Seated Heel Toe Raises - 2 x daily - 7 x weekly - 2 sets - 10 reps Sit to Stand with Armchair - 1-2 x daily - 7 x weekly - 1-2 sets - 5 reps Wide Stance with Head Rotations and Unilateral Counter Support - 1 x daily - 7 x weekly - 2 sets - 10 reps      Updated HEP (Completed all of the following exercises today for updated HEP, with patient tolerating well). Did attempt to complete toe raises in standing position, with increased difficulty noted requiring to continue to be completed in seated position at this time.   Access Code: [email or web address] URL: [email or web address] Date: 03/18/2020 Prepared by: Jethro Bastos  Exercises Romberg Stance with Head Rotation - 1 x daily - 5 x weekly - 2 sets - 10 reps Romberg Stance with Head Nods - 1 x daily - 5 x weekly - 2 sets - 10 reps Standing with Feet Apart and Eyes Closed - 1 x daily - 5 x weekly - 1 sets - 3 reps - 30 hold Standing Heel Raise with Support - 1 x daily - 5 x weekly - 2 sets - 10 reps Seated Toe Raise - 1 x daily - 5 x weekly - 2 sets - 10 reps Standing March with Counter Support - 1 x daily -  5 x weekly - 2 sets - 10 reps Walking with Head Rotation - 1 x daily - 5 x weekly - 3 sets - 10 reps         PT Education - 03/18/20 0931    Education Details Educated on HEP update    Person(s) Educated Patient    Methods Explanation;Demonstration;Handout    Comprehension Verbalized understanding;Returned demonstration            PT Short Term Goals - 02/20/20 1329      PT SHORT TERM GOAL #1   Title STG = LTG  PT Long Term Goals - 02/20/20 1241      PT LONG TERM GOAL #1   Title Patient will be independent with progressive HEP for balance/coordination (All LTG's due: 04/02/20)    Baseline continue to progress HEP    Time 6    Period Weeks    Status Revised    Target Date 04/02/20      PT LONG TERM GOAL #2   Title Patient will demonstrate ability to ambulate >1000 on indoor/outdoor surfaces without AD, and supervision for improved community mobility    Baseline 1100 ft with supervision, CGA intermittent due to imbalance with turns.    Time 6    Period Weeks    Status Revised      PT LONG TERM GOAL #3   Title Patient will demo ability to ambulate > 115 ft with scanning environment and no imbalance to demonstrate improved community negotiation    Baseline imbalance with head turns    Time 6    Period Weeks    Status Revised      PT LONG TERM GOAL #4   Title Patient will improve Berg Balance to >/= 50/56 to demonstrate reduced fall risk and improved balance    Baseline 45/56    Time 6    Period Weeks    Status Revised      PT LONG TERM GOAL #5   Title --    Baseline --    Time --    Period --    Status --      PT LONG TERM GOAL #6   Title --    Baseline --    Time --    Period --    Status --                 Plan - 03/18/20 9629    Clinical Impression Statement Today's skilled PT session included review of current HEP and progressed as tolerated. Initiated completely new HEP today as patient has dmeonstrated improved strength,  and need to continue to work on balance deficits at this time. Updated HEP to include balance exercies as tolerated by patient. Will continue to progress toward goals.    Personal Factors and Comorbidities Comorbidity 3+    Comorbidities hypertension, hyperlipidemia, depression/anxiety, degenerative disc disease in lumbar region, obesity, sleep apnea    Examination-Activity Limitations Bend;Transfers;Stairs;Stand;Lift    Examination-Participation Restrictions Occupation;Yard Work;Community Activity;Driving    Stability/Clinical Decision Making Evolving/Moderate complexity    Rehab Potential Good    PT Frequency 2x / week    PT Duration 6 weeks    PT Treatment/Interventions ADLs/Self Care Home Management;Aquatic Therapy;Cryotherapy;Electrical Stimulation;Moist Heat;DME Instruction;Gait training;Stair training;Functional mobility training;Therapeutic activities;Therapeutic exercise;Balance training;Neuromuscular re-education;Patient/family education;Orthotic Fit/Training;Manual techniques;Passive range of motion;Vestibular    PT Next Visit Plan How was HEP update? Habituation Exercises. Balance Exercises. Working on head turns, full body turns. Slowly incorporating vision removed, retropulsion noted with EC.    Consulted and Agree with Plan of Care Patient;Family member/caregiver    Family Member Consulted Wife           Patient will benefit from skilled therapeutic intervention in order to improve the following deficits and impairments:  Abnormal gait, Decreased balance, Decreased endurance, Difficulty walking, Impaired sensation, Dizziness, Decreased range of motion, Decreased activity tolerance, Decreased knowledge of use of DME, Decreased strength, Pain  Visit Diagnosis: Muscle weakness (generalized)  Unsteadiness on feet  Other abnormalities of gait and mobility  Foot drop, right     Problem List  Patient Active Problem List   Diagnosis Date Noted  . Transaminitis   . PFO with  atrial septal aneurysm 11/22/2019  . Headache 11/22/2019  . Cerebral edema (HCC) 11/22/2019  . Chest pain 11/22/2019  . Hyperlipidemia LDL goal <70 11/22/2019  . OSA (obstructive sleep apnea) 11/22/2019  . 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 Vascular headache   . Dyslipidemia   . Hypertension   . Intractable nausea and vomiting   . Hypokalemia   . Depression with anxiety   . Ischemic stroke (HCC) - L PICA infarct embolic in setting of PFO s/p crani   . Lumbar spondylolysis 07/31/2019  . Lumbar radiculopathy 04/07/2019  . Right foot drop 04/07/2019  . Backache 07/05/2017  . DDD (degenerative disc disease), lumbar 07/05/2017  . Spinal stenosis of lumbar region 07/05/2017    Tempie Donning, PT, DPT 03/18/2020, 9:40 AM  [facility] [address] [place], [place], [address] Phone: [phone]   Fax:  [phone]  Name: SHAYAN BRAMHALL MRN: [id number] Date of Birth: [birth year]

## 2020-03-18 NOTE — Patient Instructions (Signed)
Access Code: [email or web address] URL: [email or web address] Date: 03/18/2020 Prepared by: Jethro Bastos  Exercises Romberg Stance with Head Rotation - 1 x daily - 5 x weekly - 2 sets - 10 reps Romberg Stance with Head Nods - 1 x daily - 5 x weekly - 2 sets - 10 reps Standing with Feet Apart and Eyes Closed - 1 x daily - 5 x weekly - 1 sets - 3 reps - 30 hold Standing Heel Raise with Support - 1 x daily - 5 x weekly - 2 sets - 10 reps Seated Toe Raise - 1 x daily - 5 x weekly - 2 sets - 10 reps Standing March with Counter Support - 1 x daily - 5 x weekly - 2 sets - 10 reps Walking with Head Rotation - 1 x daily - 5 x weekly - 3 sets - 10 reps

## 2020-03-19 ENCOUNTER — Encounter: Payer: 59 | Attending: Registered Nurse | Admitting: Psychology

## 2020-03-19 ENCOUNTER — Other Ambulatory Visit: Payer: Self-pay

## 2020-03-19 ENCOUNTER — Encounter: Payer: Self-pay | Admitting: Psychology

## 2020-03-19 DIAGNOSIS — I69319 Unspecified symptoms and signs involving cognitive functions following cerebral infarction: Secondary | ICD-10-CM | POA: Diagnosis present

## 2020-03-19 DIAGNOSIS — I639 Cerebral infarction, unspecified: Secondary | ICD-10-CM

## 2020-03-19 DIAGNOSIS — F4323 Adjustment disorder with mixed anxiety and depressed mood: Secondary | ICD-10-CM

## 2020-03-19 NOTE — Progress Notes (Signed)
Neuropsychological Consultation   Patient:   [name]   DOB:   03/12/1971  MR Number:  [id number]  Location:  [facility] FOR PAIN AND [facility] MEDICINE [facility] PHYSICAL MEDICINE AND REHABILITATION [address] [place], [address] [id number] [facility]  [place] [address] Dept: [phone]           Date of Service:   03/19/2020  Start Time:   10 AM End Time:   12 PM  Today's visit was an in person visit that was conducted in my outpatient clinic office.  The patient and his wife are present along with myself for this visit.  1 hour and 20 minutes were spent in the clinical interview process and 40 minutes were spent in records review and treatment planning.  Provider/Observer:  Arley Phenix, Psy.D.       Clinical Neuropsychologist       Billing Code/Service: [phone]  Reason for Service:  [name] is a 49 year old male referred by [name], MD for neuropsychological consultation due to coping and adjustment issues with significant residual motor deficits and coordination deficits of his cerebrovascular accident.  The patient has a prior medical history including hypertension, sleep apnea, hyperlipidemia, depression with anxiety, obesity with BMI 39.68.  Patient presented on 11/16/2019 with dizziness and gait abnormalities.  MRI showed a 1.1 cm acute left PICA territory ischemic infarction within the left cerebral hemisphere also impacting cerebellar regions.  Additional punctuate acute infarcts were noted in the left occipital lobe.  Follow-up CT imaging showed brainstem compression with obstructive hydrocephalus with patient undergoing suboccipital craniotomy on 11/17/2019.  Patient subsequently was treated in the comprehensive inpatient rehabilitation program and is also followed up with outpatient OT/PT therapies with patient reaching his goals in PT.  The patient is continued to have fatigue, diffuse body pains, significant motor deficits including deficits in  coordination and gait changes as well.  The patient has not been able to return to work as his job as a Psychologist, occupational was a very physical job in his motor coordination deficits of left that unattainable at this point.  The patient continues with significant pain, depression and agitation.  The patient has been started on a number of medications with the most recent addition of Cymbalta started 3 weeks ago with some significant improvement particular around agitation and reactive emotional responses.  The patient also takes Klonopin at times for anxiety and distress.  The patient takes Topamax for ongoing headaches.  The patient reports that every day is a struggle for him.  The patient reports that he wakes up in the morning feeling significant body pain and it takes him a while to oriented to his surroundings and get going due to the pain.  He reports that he did have some pain before but this is much worse than everything is amplified post cerebrovascular event.  The patient's wife reports that she has seen progression in his overall function and now is walking much better but is very mechanical in his gait.  The patient continues to have significant balance and coordination issues.  Another major issue is ongoing sleep disturbance.  The patient reports that he has no normality to his sleep patterns.  He has been diagnosed with obstructive sleep apnea and uses a CPAP machine and has become much better as far as his consistency.  Appetite is returned to baseline.  He does acknowledge changes in his memory but the biggest cognitive changes a change in his loss of keeping up with  time and relationships of time frame.  There is also report of changes in sense of taste or smell.  Behavioral Observation: [name]  presents as a 49 y.o.-year-old Right Caucasian Male who appeared his stated age. his dress was Appropriate and he was Well Groomed and his manners were Appropriate to the situation.  his participation  was indicative of Appropriate and Attentive behaviors.  There were physical disabilities noted.  he displayed an appropriate level of cooperation and motivation.     Interactions:    Active Appropriate and Attentive  Attention:   within normal limits and attention span and concentration were age appropriate  Memory:   abnormal; remote memory intact, recent memory impaired  Visuo-spatial:  not examined but the patient does report changes in visual spatial perception and other vision changes particular around peripheral vision.  Speech (Volume):  normal  Speech:   normal; normal  Thought Process:  Coherent and Relevant  Though Content:  WNL; not suicidal and not homicidal  Orientation:   person, place, time/date and situation  Judgment:   Good  Planning:   Good  Affect:    Anxious and Depressed  Mood:    Dysphoric  Insight:   Good  Intelligence:   normal  Marital Status/Living: The patient was born and raised in [place] with no siblings.  The patient is married and lives with his wife.  They have been married for 4 years.  The patient had 1 previous marriage that lasted for 7 years.  The patient has 2 stepchildren.  Current Employment: The patient is not currently working due to residual effects of his cerebrovascular accident.  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  Substance Use:  No concerns of substance abuse are reported.  Education:   HS Graduate  Medical History:   Past Medical History:  Diagnosis Date   Arthritis    neck   Carpal tunnel syndrome on both sides 08/2014   DDD (degenerative disc disease), lumbar    History of kidney stones    Hypertension    under control with med., has been on med. x 1 yr.   Sleep apnea    CPAP but does not use   Stroke [facility]) 11/16/2019   Psychiatric History:  No prior psychiatric  history  Family Med/Psych History:  Family History  Problem Relation Age of Onset   Heart attack Mother     Impression/DX:  Nasean Zapf is a 49 year old male referred by [name], MD for neuropsychological consultation due to coping and adjustment issues with significant residual motor deficits and coordination deficits of his cerebrovascular accident.  The patient has a prior medical history including hypertension, sleep apnea, hyperlipidemia, depression with anxiety, obesity with BMI 39.68.  Patient presented on 11/16/2019 with dizziness and gait abnormalities.  MRI showed a 1.1 cm acute left PICA territory ischemic infarction within the left cerebral hemisphere also impacting cerebellar regions.  Additional punctuate acute infarcts were noted in the left occipital lobe.  Follow-up CT imaging showed brainstem compression with obstructive hydrocephalus with patient undergoing suboccipital craniotomy on 11/17/2019.  Patient subsequently was treated in the comprehensive inpatient rehabilitation program and is also followed up with outpatient OT/PT therapies with patient reaching his goals in PT.  The patient is continued to have fatigue, diffuse body pains, significant motor deficits including deficits in coordination and gait changes as  well.  The patient has not been able to return to work as his job as a Psychologist, occupational was a very physical job in his motor coordination deficits of left that unattainable at this point.  The patient continues with significant pain, depression and agitation.  Disposition/Plan:  We have set the patient up for formal therapeutic interventions around adjusting and coping to residual effects of his cerebrovascular accident.  The patient continues to have significant deficits around motor functioning and some cognitive changes and is not been able to return to work.  Patient has recently been started on an SNRI to help with both pain as well as mood disturbance.  Diagnosis:     Ischemic stroke (HCC) - L PICA infarct embolic in setting of PFO s/p crani  Cognitive deficit, post-stroke  Adjustment reaction with anxiety and depression         Electronically Signed   _______________________ Arley Phenix, Psy.D.

## 2020-03-20 ENCOUNTER — Ambulatory Visit: Payer: 59

## 2020-03-22 ENCOUNTER — Encounter: Payer: 59 | Admitting: Physical Medicine & Rehabilitation

## 2020-03-25 ENCOUNTER — Other Ambulatory Visit: Payer: Self-pay

## 2020-03-25 ENCOUNTER — Ambulatory Visit: Payer: 59

## 2020-03-25 DIAGNOSIS — M6281 Muscle weakness (generalized): Secondary | ICD-10-CM

## 2020-03-25 DIAGNOSIS — R2689 Other abnormalities of gait and mobility: Secondary | ICD-10-CM

## 2020-03-25 DIAGNOSIS — R2681 Unsteadiness on feet: Secondary | ICD-10-CM

## 2020-03-25 DIAGNOSIS — R278 Other lack of coordination: Secondary | ICD-10-CM

## 2020-03-25 DIAGNOSIS — M21371 Foot drop, right foot: Secondary | ICD-10-CM

## 2020-03-25 NOTE — Therapy (Signed)
[facility] [address] [place], [place], [address] Phone: [phone]   Fax:  [phone]  Physical Therapy Treatment  Patient Details  Name: [name] MRN: [id number] Date of Birth: May 12, 1971 Referring Provider (PT): [name], [name], [name], MD   Encounter Date: 03/25/2020   PT End of Session - 03/25/20 0850    Visit Number 16    Number of Visits 24    Date for PT Re-Evaluation 05/20/20   POC for 6 weeks, cert for 90 days   Authorization Type [employer]    PT Start Time [phone]    PT Stop Time 0930    PT Time Calculation (min) 43 min    Equipment Utilized During Treatment Gait belt    Activity Tolerance Patient tolerated treatment well;No increased pain    Behavior During Therapy [facility] for tasks assessed/performed           Past Medical History:  Diagnosis Date   Arthritis    neck   Carpal tunnel syndrome on both sides 08/2014   DDD (degenerative disc disease), lumbar    History of kidney stones    Hypertension    under control with med., has been on med. x 1 yr.   Sleep apnea    CPAP but does not use   Stroke (HCC) 11/16/2019    Past Surgical History:  Procedure Laterality Date   BUBBLE STUDY  11/21/2019   Procedure: BUBBLE STUDY;  Surgeon: [name], MD;  Location: [facility] ENDOSCOPY;  Service: Cardiovascular;;   CARPAL TUNNEL RELEASE Left 09/07/2014   Procedure: LEFT CARPAL TUNNEL RELEASE;  Surgeon: [name], MD;  Location: [facility];  Service: Orthopedics;  Laterality: Left;   CARPAL TUNNEL RELEASE Right 10/12/2014   Procedure: RIGHT CARPAL TUNNEL RELEASE;  Surgeon: [name], MD;  Location: [facility];  Service: Orthopedics;  Laterality: Right;   KNEE ARTHROSCOPY Left    PATENT FORAMEN OVALE(PFO) CLOSURE N/A 01/31/2020   Procedure: PATENT FORAMEN OVALE (PFO) CLOSURE;  Surgeon: [name], MD;  Location: [facility] INVASIVE CV LAB;  Service: Cardiovascular;  Laterality: N/A;   SUBOCCIPITAL CRANIECTOMY CERVICAL LAMINECTOMY N/A 11/17/2019   Procedure: SUBOCCIPITAL DECOMPRESSIVE CRANIECTOMY;  Surgeon: [name], MD;  Location: [facility] OR;  Service: Neurosurgery;  Laterality: N/A;   TEE WITHOUT CARDIOVERSION N/A 11/21/2019   Procedure: TRANSESOPHAGEAL ECHOCARDIOGRAM (TEE);  Surgeon: [name], MD;  Location: [facility] ENDOSCOPY;  Service: Cardiovascular;  Laterality: N/A;    There were no vitals filed for this visit.   Subjective Assessment - 03/25/20 0850    Subjective Patient reports had appt with [facility], is going to be working on sleep hygiene. Patient reports that he is having difficulty cutting off brain prior to sleeping. No falls since last visit. Patietn reports new additions to HEP exercises are going well.    Patient is accompained by: Family member   Wife [name]   Pertinent History hypertension, hyperlipidemia, depression/anxiety, degenerative disc disease in lumbar region, obesity, sleep apnea    Limitations Walking;Standing    Patient Stated Goals Back to Normal, Do the things I did prior    Currently in Pain? Yes    Pain Score 6     Pain Location Generalized    Pain Orientation --   all over (generalized)   Pain Descriptors / Indicators Aching;Sore    Pain Type Chronic pain    Pain Onset More than a month ago  [facility] Adult PT Treatment/Exercise - 03/25/20 0001      Ambulation/Gait   Ambulation/Gait Yes    Ambulation/Gait Assistance 5: Supervision    Ambulation/Gait Assistance Details completed ambulation with scanning enviroment for numbered cards x 300 ft. Patient reports getting disoriented 2-3x during completion, one occured with quick full body turns. continued imbalance noted with horizontal head turns and scanning environment.     Ambulation Distance (Feet) 300 Feet    Assistive device None    Gait Pattern  Step-through pattern;Decreased step length - left;Decreased stance time - right;Decreased dorsiflexion - right;Right steppage;Wide base of support;Poor foot clearance - right    Ambulation Surface Level;Indoor      High Level Balance   High Level Balance Activities Head turns    High Level Balance Comments completed ambulation with horizontal/vertical head turns x 100 ft each. increased challenge with horizontal > vertical. Progressed to addition of dual tasking including counting backwards by 1's, 2's. No increased balance challenge noted with addition of counting.            Vestibular Treatment/Exercise - 03/25/20 0001      Vestibular Treatment/Exercise   Vestibular Treatment Provided Habituation    Habituation Exercises 180 degree Turns;360 degree Turns      180 degree Turns   Number of Reps  10    Symptom Description  completed x 10 reps of 180 deg turns to R/L, increased challenge with turn to L      360 degree Turns   Number of Reps  5    Symptom Description  increased symptoms with increased speed of completion, completed x 5 reps to R/L.          New Additions to HEP in [place]:   Access Code: [email or web address] URL: [email or web address] Date: 03/25/2020 Prepared by: Jethro Bastos  Exercises Romberg Stance with Head Rotation - 1 x daily - 5 x weekly - 2 sets - 10 reps Romberg Stance with Head Nods - 1 x daily - 5 x weekly - 2 sets - 10 reps Standing with Feet Apart and Eyes Closed - 1 x daily - 5 x weekly - 1 sets - 3 reps - 30 hold Standing Heel Raise with Support - 1 x daily - 5 x weekly - 2 sets - 10 reps Seated Toe Raise - 1 x daily - 5 x weekly - 2 sets - 10 reps Standing March with Counter Support - 1 x daily - 5 x weekly - 2 sets - 10 reps Walking with Head Rotation - 1 x daily - 5 x weekly - 3 sets - 10 reps Walk Turns - 1 x daily - 5 x weekly - 2 sets - 10 reps        PT Education - 03/25/20 0925    Education Details Educated on HEP addition  (walking turns)    Person(s) Educated Patient    Methods Explanation;Demonstration;Handout    Comprehension Verbalized understanding;Returned demonstration            PT Short Term Goals - 02/20/20 1329      PT SHORT TERM GOAL #1   Title STG = LTG             PT Long Term Goals - 02/20/20 1241      PT LONG TERM GOAL #1   Title Patient will be independent with progressive HEP for balance/coordination (All LTG's due: 04/02/20)    Baseline continue to progress HEP    Time 6  Period Weeks    Status Revised    Target Date 04/02/20      PT LONG TERM GOAL #2   Title Patient will demonstrate ability to ambulate >1000 on indoor/outdoor surfaces without AD, and supervision for improved community mobility    Baseline 1100 ft with supervision, CGA intermittent due to imbalance with turns.    Time 6    Period Weeks    Status Revised      PT LONG TERM GOAL #3   Title Patient will demo ability to ambulate > 115 ft with scanning environment and no imbalance to demonstrate improved community negotiation    Baseline imbalance with head turns    Time 6    Period Weeks    Status Revised      PT LONG TERM GOAL #4   Title Patient will improve Berg Balance to >/= 50/56 to demonstrate reduced fall risk and improved balance    Baseline 45/56    Time 6    Period Weeks    Status Revised      PT LONG TERM GOAL #5   Title --    Baseline --    Time --    Period --    Status --      PT LONG TERM GOAL #6   Title --    Baseline --    Time --    Period --    Status --                 Plan - 03/25/20 1157    Clinical Impression Statement Todays skilled PT session continued focus on balance exercises with ambulation as tolerated. Continue to incorporate horizontal/vertical head turns and full body turns into exercises, intermittent rest breaks required. Patient continues to require CGA intermittently due to increased balance challenge. Updated HEP to include full body turns.  Will continue to progress toward all goals.    Personal Factors and Comorbidities Comorbidity 3+    Comorbidities hypertension, hyperlipidemia, depression/anxiety, degenerative disc disease in lumbar region, obesity, sleep apnea    Examination-Activity Limitations Bend;Transfers;Stairs;Stand;Lift    Examination-Participation Restrictions Occupation;Yard Work;Community Activity;Driving    Stability/Clinical Decision Making Evolving/Moderate complexity    Rehab Potential Good    PT Frequency 2x / week    PT Duration 6 weeks    PT Treatment/Interventions ADLs/Self Care Home Management;Aquatic Therapy;Cryotherapy;Electrical Stimulation;Moist Heat;DME Instruction;Gait training;Stair training;Functional mobility training;Therapeutic activities;Therapeutic exercise;Balance training;Neuromuscular re-education;Patient/family education;Orthotic Fit/Training;Manual techniques;Passive range of motion;Vestibular    PT Next Visit Plan Habituation Exercises. Balance Exercises. Working on head turns, full body turns. Slowly incorporating vision removed, retropulsion noted with EC.    Consulted and Agree with Plan of Care Patient;Family member/caregiver    Family Member Consulted Wife           Patient will benefit from skilled therapeutic intervention in order to improve the following deficits and impairments:  Abnormal gait, Decreased balance, Decreased endurance, Difficulty walking, Impaired sensation, Dizziness, Decreased range of motion, Decreased activity tolerance, Decreased knowledge of use of DME, Decreased strength, Pain  Visit Diagnosis: Muscle weakness (generalized)  Unsteadiness on feet  Other abnormalities of gait and mobility  Foot drop, right  Other lack of coordination     Problem List Patient Active Problem List   Diagnosis Date Noted   Transaminitis    PFO with atrial septal aneurysm 11/22/2019   Headache 11/22/2019   Cerebral edema (HCC) 11/22/2019   Chest pain  11/22/2019   Hyperlipidemia LDL goal <70 11/22/2019  OSA (obstructive sleep apnea) 11/22/2019   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) 11/22/2019   Vascular headache    Dyslipidemia    Hypertension    Intractable nausea and vomiting    Hypokalemia    Depression with anxiety    Ischemic stroke (HCC) - L PICA infarct embolic in setting of PFO s/p crani    Lumbar spondylolysis 07/31/2019   Lumbar radiculopathy 04/07/2019   Right foot drop 04/07/2019   Backache 07/05/2017   DDD (degenerative disc disease), lumbar 07/05/2017   Spinal stenosis of lumbar region 07/05/2017    Tempie Donning, PT, DPT 03/25/2020, 11:58 AM  [facility] [address] [place], [place], [address] Phone: [phone]   Fax:  [phone]  Name: BADEN BETSCH MRN: [id number] Date of Birth: 1970/12/21

## 2020-03-25 NOTE — Patient Instructions (Addendum)
Access Code: [email or web address] URL: [email or web address] Date: 03/25/2020 Prepared by: Jethro Bastos  Exercises Romberg Stance with Head Rotation - 1 x daily - 5 x weekly - 2 sets - 10 reps Romberg Stance with Head Nods - 1 x daily - 5 x weekly - 2 sets - 10 reps Standing with Feet Apart and Eyes Closed - 1 x daily - 5 x weekly - 1 sets - 3 reps - 30 hold Standing Heel Raise with Support - 1 x daily - 5 x weekly - 2 sets - 10 reps Seated Toe Raise - 1 x daily - 5 x weekly - 2 sets - 10 reps Standing March with Counter Support - 1 x daily - 5 x weekly - 2 sets - 10 reps Walking with Head Rotation - 1 x daily - 5 x weekly - 3 sets - 10 reps Walk Turns - 1 x daily - 5 x weekly - 2 sets - 10 reps

## 2020-03-27 ENCOUNTER — Ambulatory Visit: Payer: 59

## 2020-03-27 ENCOUNTER — Other Ambulatory Visit: Payer: Self-pay

## 2020-03-27 DIAGNOSIS — M6281 Muscle weakness (generalized): Secondary | ICD-10-CM | POA: Diagnosis not present

## 2020-03-27 DIAGNOSIS — R2689 Other abnormalities of gait and mobility: Secondary | ICD-10-CM

## 2020-03-27 DIAGNOSIS — M21371 Foot drop, right foot: Secondary | ICD-10-CM

## 2020-03-27 DIAGNOSIS — R2681 Unsteadiness on feet: Secondary | ICD-10-CM

## 2020-03-27 NOTE — Therapy (Signed)
[facility] [address] [place], [place], [address] Phone: [phone]   Fax:  [phone]  Physical Therapy Treatment  Patient Details  Name: [name] MRN: [id number] Date of Birth: 10-19-1970 Referring Provider (PT): [name], [name], [name], MD   Encounter Date: 03/27/2020   PT End of Session - 03/27/20 0849    Visit Number 17    Number of Visits 24    Date for PT Re-Evaluation 05/20/20   POC for 6 weeks, cert for 90 days   Authorization Type [employer]    PT Start Time 0848    PT Stop Time 0929    PT Time Calculation (min) 41 min    Equipment Utilized During Treatment Gait belt    Activity Tolerance Patient tolerated treatment well;No increased pain    Behavior During Therapy WFL for tasks assessed/performed           Past Medical History:  Diagnosis Date  . Arthritis    neck  . Carpal tunnel syndrome on both sides 08/2014  . DDD (degenerative disc disease), lumbar   . History of kidney stones   . Hypertension    under control with med., has been on med. x 1 yr.  . Sleep apnea    CPAP but does not use  . Stroke [facility]) 11/16/2019    Past Surgical History:  Procedure Laterality Date  . BUBBLE STUDY  11/21/2019   Procedure: BUBBLE STUDY;  Surgeon: [name], MD;  Location: [facility] ENDOSCOPY;  Service: Cardiovascular;;  . CARPAL TUNNEL RELEASE Left 09/07/2014   Procedure: LEFT CARPAL TUNNEL RELEASE;  Surgeon: [name], MD;  Location: [facility];  Service: Orthopedics;  Laterality: Left;  . CARPAL TUNNEL RELEASE Right 10/12/2014   Procedure: RIGHT CARPAL TUNNEL RELEASE;  Surgeon: [name], MD;  Location: [facility];  Service: Orthopedics;  Laterality: Right;  . KNEE ARTHROSCOPY Left   . PATENT FORAMEN OVALE(PFO) CLOSURE N/A 01/31/2020   Procedure: PATENT FORAMEN OVALE (PFO) CLOSURE;  Surgeon: [name], MD;  Location: [facility] INVASIVE CV LAB;  Service: Cardiovascular;  Laterality: N/A;  . SUBOCCIPITAL CRANIECTOMY CERVICAL LAMINECTOMY N/A 11/17/2019   Procedure: SUBOCCIPITAL DECOMPRESSIVE CRANIECTOMY;  Surgeon: [name], MD;  Location: [facility] OR;  Service: Neurosurgery;  Laterality: N/A;  . TEE WITHOUT CARDIOVERSION N/A 11/21/2019   Procedure: TRANSESOPHAGEAL ECHOCARDIOGRAM (TEE);  Surgeon: [name], MD;  Location: [facility] ENDOSCOPY;  Service: Cardiovascular;  Laterality: N/A;    There were no vitals filed for this visit.   Subjective Assessment - 03/27/20 0851    Subjective Patient reports that was laying down on left side and look up why doing some painting at home/working on materials at home. Patient reports that began to feel sick to stomach and reports took 1 -2 hours to recover. Patient also reports he had an anxiety attack yesterday. Patient reports feeling groggy this morning.    Patient is accompained by: Family member   Wife [name]   Pertinent History hypertension, hyperlipidemia, depression/anxiety, degenerative disc disease in lumbar region, obesity, sleep apnea    Limitations Walking;Standing    Patient Stated Goals Back to Normal, Do the things I did prior    Currently in Pain? Yes    Pain Score 7     Pain Location Generalized    Pain Orientation --   All Over   Pain Descriptors / Indicators Aching;Sore    Pain Type Chronic pain  Pain Onset More than a month ago                   [facility] - 03/27/20 0916      Ambulation/Gait   Ambulation/Gait Yes    Ambulation/Gait Assistance 6: Modified independent (Device/Increase time)    Ambulation/Gait Assistance Details throughout therapy gym with acitivites    Ambulation Distance (Feet) --   clinic distances   Assistive device None    Gait Pattern Step-through pattern;Decreased step length - left;Decreased stance time - right;Decreased dorsiflexion - right;Right steppage;Wide  base of support;Poor foot clearance - right    Ambulation Surface Level;Indoor      Neuro Re-ed    Neuro Re-ed Details  Standing on firm surface completed reaching for cones x 6. Progressed to completing standing on blue mat (complaint surface) 2 x 6 reps. Mild dizziness with completion. Standing in // bars: completed alternating marching x 10 reps, progressed to completing alteranting marching with shoulder/ hip flexion. increased balance challenge with completion of this as well as difficulty with coordination requiring intermittent UE support and CGA from PT.       Exercises   Exercises Other Exercises    Other Exercises  seated on green air disc: completed alternating marching x 10 reps with BLE, progressed to addition of UE completed atlernating hip/shoulder flexion x 10 reps, increased challenge with coordinating. Progressed to completing alternating LAQ x 10 reps billaterally without UE support. Completed on green disc, completed trunk rotations to R/L with 1# weighted ball, 2 x 10 reps to bilateral direction. Progressed to completing diagonal rotations to bilateral directions x 10 reps each direction, patient reports challenging due to ball being only in focus, as everything in background was blurry.              Balance Exercises - 03/27/20 0916      Balance Exercises: Standing   Rockerboard Anterior/posterior;Lateral;Head turns;EO;Intermittent UE support;Limitations    Rockerboard Limitations Standing on rockerboard positioned A/P holding steady x 1 minute, then progressed to completing horizontal/vertical head turns x 15 reps each. With board positioned laterally, completed holding steady 2 x 1 min each. Then progressed to completing standing with B shoulder flexion with 1# weighted ball. Progressed to completing horizontal/vertical head tuns x 15 reps. Increased balance challenge with board positioned lateral > A/P.                PT Short Term Goals - 02/20/20 1329      PT  SHORT TERM GOAL #1   Title STG = LTG             PT Long Term Goals - 02/20/20 1241      PT LONG TERM GOAL #1   Title Patient will be independent with progressive HEP for balance/coordination (All LTG's due: 04/02/20)    Baseline continue to progress HEP    Time 6    Period Weeks    Status Revised    Target Date 04/02/20      PT LONG TERM GOAL #2   Title Patient will demonstrate ability to ambulate >1000 on indoor/outdoor surfaces without AD, and supervision for improved community mobility    Baseline 1100 ft with supervision, CGA intermittent due to imbalance with turns.    Time 6    Period Weeks    Status Revised      PT LONG TERM GOAL #3   Title Patient will demo ability to ambulate > 115 ft with  scanning environment and no imbalance to demonstrate improved community negotiation    Baseline imbalance with head turns    Time 6    Period Weeks    Status Revised      PT LONG TERM GOAL #4   Title Patient will improve Berg Balance to >/= 50/56 to demonstrate reduced fall risk and improved balance    Baseline 45/56    Time 6    Period Weeks    Status Revised      PT LONG TERM GOAL #5   Title --    Baseline --    Time --    Period --    Status --      PT LONG TERM GOAL #6   Title --    Baseline --    Time --    Period --    Status --                 Plan - 03/27/20 0934    Clinical Impression Statement Continued balance exercises today focus on continued activites to promote core control/coordination/seated and standing balance, including activites on rockerboard. Patient conitnues to demo decreased coordination and core control with activites in standing as well as seated on green air disc. Will continue to progress toward all goals.    Personal Factors and Comorbidities Comorbidity 3+    Comorbidities hypertension, hyperlipidemia, depression/anxiety, degenerative disc disease in lumbar region, obesity, sleep apnea    Examination-Activity Limitations  Bend;Transfers;Stairs;Stand;Lift    Examination-Participation Restrictions Occupation;Yard Work;Community Activity;Driving    Stability/Clinical Decision Making Evolving/Moderate complexity    Rehab Potential Good    PT Frequency 2x / week    PT Duration 6 weeks    PT Treatment/Interventions ADLs/Self Care Home Management;Aquatic Therapy;Cryotherapy;Electrical Stimulation;Moist Heat;DME Instruction;Gait training;Stair training;Functional mobility training;Therapeutic activities;Therapeutic exercise;Balance training;Neuromuscular re-education;Patient/family education;Orthotic Fit/Training;Manual techniques;Passive range of motion;Vestibular    PT Next Visit Plan Try Core exercises seated on Ball. Discharge planned for Fridays visit due to patient wanting to take break from PT services.    Consulted and Agree with Plan of Care Patient;Family member/caregiver    Family Member Consulted Wife           Patient will benefit from skilled therapeutic intervention in order to improve the following deficits and impairments:  Abnormal gait, Decreased balance, Decreased endurance, Difficulty walking, Impaired sensation, Dizziness, Decreased range of motion, Decreased activity tolerance, Decreased knowledge of use of DME, Decreased strength, Pain  Visit Diagnosis: Muscle weakness (generalized)  Unsteadiness on feet  Other abnormalities of gait and mobility  Foot drop, right     Problem List Patient Active Problem List   Diagnosis Date Noted  . Transaminitis   . PFO with atrial septal aneurysm 11/22/2019  . Headache 11/22/2019  . Cerebral edema (HCC) 11/22/2019  . Chest pain 11/22/2019  . Hyperlipidemia LDL goal <70 11/22/2019  . OSA (obstructive sleep apnea) 11/22/2019  . 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 Vascular headache   . Dyslipidemia   . Hypertension   . Intractable nausea and vomiting   . Hypokalemia   . Depression with anxiety   .  Ischemic stroke (HCC) - L PICA infarct embolic in setting of PFO s/p crani   . Lumbar spondylolysis 07/31/2019  . Lumbar radiculopathy 04/07/2019  . Right foot drop 04/07/2019  . Backache 07/05/2017  . DDD (degenerative disc disease), lumbar 07/05/2017  . Spinal stenosis of lumbar region 07/05/2017    Tempie Donning, PT,  DPT 03/27/2020, 9:39 AM  [facility] [address] [place], [place], [address] Phone: [phone]   Fax:  [phone]  Name: [name] MRN: [id number] Date of Birth: 02-22-1971

## 2020-03-28 ENCOUNTER — Encounter (HOSPITAL_BASED_OUTPATIENT_CLINIC_OR_DEPARTMENT_OTHER): Payer: 59 | Admitting: Psychology

## 2020-03-28 ENCOUNTER — Other Ambulatory Visit: Payer: Self-pay

## 2020-03-28 DIAGNOSIS — I69319 Unspecified symptoms and signs involving cognitive functions following cerebral infarction: Secondary | ICD-10-CM

## 2020-03-28 DIAGNOSIS — I639 Cerebral infarction, unspecified: Secondary | ICD-10-CM | POA: Diagnosis not present

## 2020-03-28 DIAGNOSIS — F4323 Adjustment disorder with mixed anxiety and depressed mood: Secondary | ICD-10-CM | POA: Diagnosis not present

## 2020-04-02 ENCOUNTER — Ambulatory Visit: Payer: 59

## 2020-04-02 ENCOUNTER — Ambulatory Visit (INDEPENDENT_AMBULATORY_CARE_PROVIDER_SITE_OTHER): Payer: 59 | Admitting: Adult Health

## 2020-04-02 ENCOUNTER — Other Ambulatory Visit: Payer: Self-pay

## 2020-04-02 ENCOUNTER — Encounter: Payer: Self-pay | Admitting: Adult Health

## 2020-04-02 VITALS — BP 142/91 | HR 78 | Ht 75.0 in | Wt 315.0 lb

## 2020-04-02 DIAGNOSIS — M6281 Muscle weakness (generalized): Secondary | ICD-10-CM

## 2020-04-02 DIAGNOSIS — Q211 Atrial septal defect: Secondary | ICD-10-CM

## 2020-04-02 DIAGNOSIS — I639 Cerebral infarction, unspecified: Secondary | ICD-10-CM | POA: Diagnosis not present

## 2020-04-02 DIAGNOSIS — E785 Hyperlipidemia, unspecified: Secondary | ICD-10-CM

## 2020-04-02 DIAGNOSIS — F418 Other specified anxiety disorders: Secondary | ICD-10-CM

## 2020-04-02 DIAGNOSIS — R2681 Unsteadiness on feet: Secondary | ICD-10-CM

## 2020-04-02 DIAGNOSIS — M21371 Foot drop, right foot: Secondary | ICD-10-CM

## 2020-04-02 DIAGNOSIS — G4733 Obstructive sleep apnea (adult) (pediatric): Secondary | ICD-10-CM

## 2020-04-02 DIAGNOSIS — Q2112 Patent foramen ovale: Secondary | ICD-10-CM

## 2020-04-02 DIAGNOSIS — Z9989 Dependence on other enabling machines and devices: Secondary | ICD-10-CM

## 2020-04-02 DIAGNOSIS — R2689 Other abnormalities of gait and mobility: Secondary | ICD-10-CM

## 2020-04-02 DIAGNOSIS — I10 Essential (primary) hypertension: Secondary | ICD-10-CM

## 2020-04-02 MED ORDER — CLONAZEPAM 0.5 MG PO TABS: 0.5000 mg | ORAL_TABLET | Freq: Two times a day (BID) | ORAL | 0 refills | Status: DC | PRN

## 2020-04-02 NOTE — Progress Notes (Signed)
[facility] [address] [place]. [place] [address] [phone]       OFFICE FOLLOW-UP NOTE  [name] [name] [name] Date of Birth:  06/22/1970 Medical Record Number:  [id number]   Reason for visit: Stroke follow-up  Chief Complaint  [name] [name] presents with  . Follow-up    rm 9  . Cerebrovascular Accident    pt said he has no new sx. Pt said he is just here for a routine f/u.      HPI:   Today, 04/02/2020, [name] [name] [name] returns for 39-monthstroke follow-up unaccompanied.  He reports residual balance difficulties with occasional dizziness currently working with PT progressing towards goals.  He is also been struggling with anxiety, diffuse body pains and fatigue with some improvement after starting Cymbalta 30 mg daily.  He has also been following with Dr. RSima Matasneuropsychology which has been beneficial.  Denies residual visual difficulties or headaches.  Previously on topiramate but he has self discontinued without recurrence of headaches.  Daytime fatigue has been slowly improving but has had difficulty sleeping throughout [name] night as he reports his mind wanders.  Previously on clonazepam which greatly benefited him in regards to insomnia but he has had difficulty obtaining a refill.  Reports compliance with CPAP for OSA management. Underwent successful PFO closure on 01/31/2020 by Dr. CBurt Knack  He has remained on aspirin and Plavix without bleeding or bruising for secondary stroke prevention and post PFO closure for total of 631-monthuration.  He also remains on atorvastatin 10 mg daily without myalgias.  Blood pressure today 142/91.  No further concerns at this time.    History provided for reference purposes only Initial visit 12/26/2019 Dr. SeLeonie ManMr. [name] [name] a 49 year old Caucasian male seen today for initial office follow-up visit following hospital admission for stroke in July 2021.  He is accompanied by his wife.  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 He reported that he went to bed 11/15/2019 night normal, woke up next morning   at 530, left [name] house at 7 AM to take his father to a doctor's appointment when he had a sudden onset of dizziness while he was driving. He describes as a sudden onset of [name] whole world around him spinning making it difficult to concentrate on [name] road and drive. He immediately pulled over, put his car in park, and his cousin's house was right around [name] corner, who called an ambulance for him. He was very ataxic while walking. He also was complaining of chest pain going on for 2 or 3 days and in fact had been seen by [name] nurse at his workplace but found to be okay.. Marland Kitchene was also very ataxic sitting up in bed. He was brought into [name] emergency room and evaluated for his chest pain and also an MRI was obtained for his complaints of dizziness for [name] concern of a posterior circulation stroke. 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 He was admitted to [name] hospital service and a neurological consultation was obtained. He was LKW:7 AM on 11/16/2019. tPA not administered as stroke not recognized initially in [name] ED-examination was  normal other than subjective dizziness symptoms as documented in [name] ED provider note.NIH stroke scale on arrival 0 per chart review. Premorbid modified Rankin scale (mRS):0 .  MRI scan showed left posterior inferior cerebellar infarct without mass-effect and a punctate left occipital infarct.  CT angiogram of [name] head and neck showed no significant large vessel stenosis or occlusion.  Follow-up CT scan 11/17/2019  showed stable affect however [name] [name] had significant neurological decline subsequently requiring emergent neurosurgery consult with suboccipital decompressive craniectomy performed by Dr. Venetia Constable on 11/17/2019.  Follow-up scan showed small scattered subarachnoid hemorrhage in [name] posterior fossa with subacute cerebellar PICA infarct on [name] left with partial effacement of fourth ventricle.  2D echo showed normal ejection fraction.  TCD bubble study was positive for right to left shunt at rest but unable to perform Valsalva because of [name] [name]'s condition.  TEE was subsequently obtained which showed highly mobile in her atrial septum but no clear PFO or ASD flow was noted.  A cardiac CT was recommended for further evaluation of anatomy.  Urine drug screen was positive for cannabis.  LDL cholesterol 134 mg percent.  Hemoglobin A1c 5.7.  ANA was positive and a titer of 1 is to 80 but ESR was normal at 6 mm.  Hypercoagulable panel labs were all normal.  [name] [name] is seen by physical occupational therapy and felt to be a good [name] [name] for inpatient rehab.  He was started on aspirin Plavix for 3 weeks followed by aspirin alone.    [name] [name] states he is to started outpatient therapy now yesterday.  He is able to walk with a wheeled walker.  He still has imbalance.  His headaches seem to be improving but he still taking Topamax 75 mg twice daily which seems to help.  He has stopped taking Fioricet.  [name] [name] has seen Dr. Sherren Mocha on 12/20/2019 for outpatient consultation for endovascular PFO closure but he and his wife need more time and not sure they want to do this at [name] present time but not willing to revisit it later.  [name] [name] [name] feels that his symptoms were brought on due to exposure to liquid carbon dioxide for prolonged.'s during [name] few weeks prior to onset of his stroke.  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  There was a  leak in [name] pipe and he got exposed to that.  He also had other complaints of chest pain and shortness of breath at [name] time he had [name] stroke.  He wants me to comment whether his stroke was caused by his workplace exposure to cryogenic liquid or carbon dioxide.    ROS:   14 system review of systems is positive for imbalance, depression, anxiety, fatigue and insomnia.  All other systems negative  PMH:  Past Medical History:  Diagnosis Date  . Arthritis    neck  . Carpal tunnel syndrome on both sides 08/2014  . DDD (degenerative disc disease), lumbar   . History of kidney stones   . Hypertension    under control with med., has been on med. x 1 yr.  . Sleep apnea    CPAP but does not use  . Stroke [facility]) 11/16/2019    Social History:  Social History   Socioeconomic History  . Marital status: Married    Spouse name: [name]  . Number of children: Not on file  . Years of education: Not on file  .  Highest education level: Not on file  Occupational History  . Not on file  Tobacco Use  . Smoking status: Never Smoker  . Smokeless tobacco: Never Used  Vaping Use  . Vaping Use: Former  Substance and Sexual Activity  . Alcohol use: Not Currently    Comment: quit 2006  . Drug use: No  . Sexual activity: Yes  Other Topics Concern  . Not on file  Social History Narrative   Right handed    Lives with wife    Social Determinants of Health   Financial Resource Strain:   . Difficulty of Paying Living Expenses: Not on file  Food Insecurity:   . Worried About Charity fundraiser in [name] Last Year: Not on file  . Ran Out of Food in [name] Last Year: Not on file  Transportation Needs:   . Lack of Transportation (Medical): Not on file  . Lack of Transportation (Non-Medical): Not on file  Physical Activity:   . Days of Exercise per Week: Not on file  . Minutes of Exercise per Session: Not on file  Stress:   . Feeling of Stress : Not on file  Social Connections:   . Frequency of  Communication with Friends and Family: Not on file  . Frequency of Social Gatherings with Friends and Family: Not on file  . Attends Religious Services: Not on file  . Active Member of Clubs or Organizations: Not on file  . Attends Archivist Meetings: Not on file  . Marital Status: Not on file  Intimate Partner Violence:   . Fear of Current or Ex-Partner: Not on file  . Emotionally Abused: Not on file  . Physically Abused: Not on file  . Sexually Abused: Not on file    Medications:   Current Outpatient Medications on File Prior to Visit  Medication Sig Dispense Refill  . aspirin EC 81 MG EC tablet Take 1 tablet (81 mg total) by mouth daily. Swallow whole. 30 tablet 11  . atorvastatin (LIPITOR) 10 MG tablet Take 10 mg by mouth daily.    . clopidogrel (PLAVIX) 75 MG tablet Take 1 tablet (75 mg total) by mouth daily. 90 tablet 3  . cyanocobalamin (,VITAMIN B-12,) 1000 MCG/ML injection Inject 1,000 mcg into [name] muscle once a week.    . DULoxetine (CYMBALTA) 30 MG capsule Take 1 capsule (30 mg total) by mouth daily. 30 capsule 1  . losartan (COZAAR) 50 MG tablet Take 50 mg by mouth daily.    . meclizine (ANTIVERT) 25 MG tablet TAKE 1 TABLET(25 MG) BY MOUTH THREE TIMES DAILY AS NEEDED FOR DIZZINESS OR NAUSEA 30 tablet 0   No current facility-administered medications on file prior to visit.    Allergies:  No Known Allergies  Physical Exam Today's Vitals   04/02/20 0733  BP: (!) 142/91  Pulse: 78  Weight: (!) 315 lb (142.9 kg)  Height: _0  (1.905 m)   Body mass index is 39.37 kg/m.  General: Obese pleasant middle-age Caucasian male seated, in no evident distress Head: head normocephalic and atraumatic.  Neck: supple with no carotid or supraclavicular bruits Cardiovascular: regular rate and rhythm, no murmurs Musculoskeletal: no deformity Skin:  no rash/petichiae Vascular:  Normal pulses all extremities  Neurologic Exam Mental Status: Awake and fully alert. Fluent  speech and language.  Oriented to place and time. Recent and remote memory intact. Attention span, concentration and fund of knowledge appropriate. Mood and affect appropriate.  Cranial Nerves: Pupils equal, briskly  reactive to light. Extraocular movements full without nystagmus. Visual fields full to confrontation. Hearing intact. Facial sensation intact. Face, tongue, palate moves normally and symmetrically.  Motor: Normal bulk and tone. Normal strength in all tested extremity muscles except chronic right foot drop. Sensory.: intact to touch ,pinprick .position and vibratory sensation.  Coordination: Rapid alternating movements normal in all extremities.  Finger-to-nose and knee to heel mild left-sided dysmetria Gait and Station: Arises from chair with  difficulty. Stance is broad-based.  Gait demonstrates normal stride length with mild imbalance without assistive device.  Mild to moderate difficulty performing tandem walk.  Romberg positive. Reflexes: 1+ and symmetric. Toes downgoing.       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  Vascular risk factors of PFO, mild obesity, hyperlipidemia, hypertension and cannabis abuse.      1. L PICA stroke, cryptogenic:  a. Residual imbalance currently working with PT with continued progression towards goals.  He discussed potential return to work with his goal around [name] first of [name] year.  We discussed that this may be a reasonable goal with continued participation with PT.   b. Continue aspirin 81 mg daily and clopidogrel 75 mg daily  and atorvastatin 10 mg daily for secondary stroke prevention.  c. Discussed secondary stroke prevention measures and importance of close PCP f/u for aggressive stroke risk factor management  2. PFO:  a. s/p successful closure 01/31/2020 by Dr. Burt Knack. Per recommendations, continue DAPT for total of 44-monthduration post  procedure with plans on repeating echo 1 year after procedure 3. OSA on CPAP:  a. Endorses compliance which was highly encouraged to continue.  Complaints of daytime fatigue therefore will attempt to obtain compliance report to ensure optimal treatment post stroke. Also difficulty with insomnia previously well managed on clonazepam therefore refill will be placed to restart clonazepam as underlying insomnia may be contributing to fatigue.  We also discussed continued fatigue may be post stroke and should continue to improve as time goes on.   4. Depression/anxiety:  a. Adjustment difficulty post stroke.  Repeat is great improvement after starting Cymbalta.  Continue to follow with Dr. RSima Matasfor ongoing benefit.  He has been having less anxiety attacks but will continue to occur occasionally. As this may impact continued recovery, refill for clonazepam 0.5 mg twice daily as needed for anxiety and sleep.  Request ongoing refills provided by PCP for long-term monitoring and management.  He was not interested in increasing Cymbalta dosage at this time 5. HTN: BP goal <130/90. Stable today on losartan per PCP 6. HLD: LDL goal <70. On atorvastatin 10 mg daily per PCP    Follow-up in 4 months or call earlier if needed   CC:  GNew Oxfordprovider: Dr. SLyn Records LLattie Haw MD      I spent 40 minutes of face-to-face and non-face-to-face time with [name] [name].  This included previsit chart review, lab review, study review, order entry, electronic health record documentation, [name] [name] education and discussion regarding history of prior stroke, residual deficits, complaints of fatigue and depression/anxiety, importance of managing stroke risk factors and answered all other questions to [name] [name] satisfaction  JFrann Rider AGNP-BC  [facility] [address] [address] Phone [phone] [phone]: This document was prepared with digital dictation and  possible smart phrase technology. Any transcriptional errors that result from this process are unintentional.

## 2020-04-02 NOTE — Patient Instructions (Signed)
Continue working with PT with ongoing improvement and progressing towards your goals  Refill placed for clonazepam 0.5 mg twice daily as needed for anxiety and sleep -request ongoing refills through your PCP for long-term monitoring and management  Ongoing use of CPAP for sleep apnea management - we will try to obtain a download report to ensure satisfactory treatment of your apnea since your stroke  Continue aspirin 81 mg daily and clopidogrel 75 mg daily  and atorvastatin for secondary stroke prevention  Continue to follow up with PCP regarding cholesterol and blood pressure management  Maintain strict control of hypertension with blood pressure goal below 130/90 and cholesterol with LDL cholesterol (bad cholesterol) goal below 70 mg/dL.       Followup in the future with me in 4 months or call earlier if needed       Thank you for coming to see [place] at [facility]. I hope we have been able to provide you high quality care today.  You may receive a patient satisfaction survey over the next few weeks. We would appreciate your feedback and comments so that we may continue to improve ourselves and the health of our patients.

## 2020-04-02 NOTE — Therapy (Signed)
[facility] [address] [place], [place], [address] Phone: [phone]   Fax:  [phone]  Physical Therapy Treatment  Patient Details  Name: [name] MRN: [id number] Date of Birth: [birth year] Referring Provider (PT): [name], [name], [name], MD   Encounter Date: 04/02/2020   PT End of Session - 04/02/20 0932    Visit Number 18    Number of Visits 24    Date for PT Re-Evaluation 05/20/20   POC for 6 weeks, cert for 90 days   Authorization Type [employer]    PT Start Time [phone]   pt using bathroom prior to session   PT Stop Time 1015    PT Time Calculation (min) 40 min    Equipment Utilized During Treatment Gait belt    Activity Tolerance Patient tolerated treatment well;No increased pain    Behavior During Therapy [facility] for tasks assessed/performed           Past Medical History:  Diagnosis Date   Arthritis    neck   Carpal tunnel syndrome on both sides 08/2014   DDD (degenerative disc disease), lumbar    History of kidney stones    Hypertension    under control with med., has been on med. x 1 yr.   Sleep apnea    CPAP but does not use   Stroke (HCC) 11/16/2019    Past Surgical History:  Procedure Laterality Date   BUBBLE STUDY  11/21/2019   Procedure: BUBBLE STUDY;  Surgeon: [name], MD;  Location: [facility] ENDOSCOPY;  Service: Cardiovascular;;   CARPAL TUNNEL RELEASE Left 09/07/2014   Procedure: LEFT CARPAL TUNNEL RELEASE;  Surgeon: [name], MD;  Location: [facility];  Service: Orthopedics;  Laterality: Left;   CARPAL TUNNEL RELEASE Right 10/12/2014   Procedure: RIGHT CARPAL TUNNEL RELEASE;  Surgeon: [name], MD;  Location: [facility];  Service: Orthopedics;  Laterality: Right;   KNEE ARTHROSCOPY Left    PATENT FORAMEN OVALE(PFO) CLOSURE N/A 01/31/2020   Procedure: PATENT FORAMEN  OVALE (PFO) CLOSURE;  Surgeon: [name], MD;  Location: [facility] INVASIVE CV LAB;  Service: Cardiovascular;  Laterality: N/A;   SUBOCCIPITAL CRANIECTOMY CERVICAL LAMINECTOMY N/A 11/17/2019   Procedure: SUBOCCIPITAL DECOMPRESSIVE CRANIECTOMY;  Surgeon: [name], MD;  Location: [facility] OR;  Service: Neurosurgery;  Laterality: N/A;   TEE WITHOUT CARDIOVERSION N/A 11/21/2019   Procedure: TRANSESOPHAGEAL ECHOCARDIOGRAM (TEE);  Surgeon: [name], MD;  Location: [facility] ENDOSCOPY;  Service: Cardiovascular;  Laterality: N/A;    There were no vitals filed for this visit.   Subjective Assessment - 04/02/20 0935    Subjective Reports appt with [facility] went well this morning. Patient reporting no new changes/complaints since last visit. Has a personal goal to go back to restricted work at beginning of year.    Patient is accompained by: Family member   Wife [name]   Pertinent History hypertension, hyperlipidemia, depression/anxiety, degenerative disc disease in lumbar region, obesity, sleep apnea    Limitations Walking;Standing    Patient Stated Goals Back to Normal, Do the things I did prior    Currently in Pain? Yes    Pain Score 6     Pain Location Back    Pain Orientation Lower    Pain Descriptors / Indicators Aching;Sore    Pain Type Chronic pain    Pain Onset More than a month ago  [facility] Adult PT Treatment/Exercise - 04/02/20 0940      Ambulation/Gait   Ambulation/Gait Yes    Ambulation/Gait Assistance 6: Modified independent (Device/Increase time)    Ambulation/Gait Assistance Details with high level balance    Assistive device None    Gait Pattern Step-through pattern;Decreased step length - left;Decreased stance time - right;Decreased dorsiflexion - right;Right steppage;Wide base of support;Poor foot clearance - right    Ambulation Surface Level;Indoor      Standardized Balance Assessment   Standardized Balance Assessment Berg Balance Test      Berg  Balance Test   Sit to Stand Able to stand without using hands and stabilize independently    Standing Unsupported Able to stand safely 2 minutes    Sitting with Back Unsupported but Feet Supported on Floor or Stool Able to sit safely and securely 2 minutes    Stand to Sit Sits safely with minimal use of hands    Transfers Able to transfer safely, minor use of hands    Standing Unsupported with Eyes Closed Able to stand 10 seconds safely    Standing Ubsupported with Feet Together Able to place feet together independently and stand 1 minute safely    From Standing, Reach Forward with Outstretched Arm Can reach confidently >25 cm (10")    From Standing Position, Pick up Object from Floor Able to pick up shoe safely and easily    From Standing Position, Turn to Look Behind Over each Shoulder Looks behind from both sides and weight shifts well    Turn 360 Degrees Able to turn 360 degrees safely in 4 seconds or less    Standing Unsupported, Alternately Place Feet on Step/Stool Able to stand independently and safely and complete 8 steps in 20 seconds    Standing Unsupported, One Foot in Front Able to plae foot ahead of the other independently and hold 30 seconds    Standing on One Leg Able to lift leg independently and hold 5-10 seconds    Total Score 54      High Level Balance   High Level Balance Activities Head turns    High Level Balance Comments completed ambulation with horizontal/vertical head turn 2 x 50' with working on maintaining pace during completion.                Balance Exercises - 04/02/20 0001      Balance Exercises: Standing   Balance Beam standing across blue balance beam with wide BOS completed horizontal/vertical head turns 1 x 10 reps, progrssed to completing with narrow BOS 1 x 10 reps of horizontal/vertical, increased challenge with narrow BOS. With narrow BOS completed B shoulder flexion with 1# weighted ball with visual tracking, incrased balance challenge with  increased posterior sway. Intermittent UE support and CGA from PT.     Sidestepping Foam/compliant support;3 reps;Limitations    Sidestepping Limitations on blue balance beam completed side stepping down and back x 3 laps. CGA required with completion.              PT Education - 04/02/20 0937    Education Details Educated on [employer]ill LynchBerg Balance Results    Person(s) Educated Patient    Methods Explanation    Comprehension Verbalized understanding            PT Short Term Goals - 02/20/20 1329      PT SHORT TERM GOAL #1   Title STG = LTG  PT Long Term Goals - 04/02/20 0950      PT LONG TERM GOAL #1   Title Patient will be independent with progressive HEP for balance/coordination (All LTG's due: 04/02/20)    Baseline continue to progress HEP    Time 6    Period Weeks    Status Revised      PT LONG TERM GOAL #2   Title Patient will demonstrate ability to ambulate >1000 on indoor/outdoor surfaces without AD, and supervision for improved community mobility    Baseline 1100 ft with supervision, CGA intermittent due to imbalance with turns.    Time 6    Period Weeks    Status Revised      PT LONG TERM GOAL #3   Title Patient will demo ability to ambulate > 115 ft with scanning environment and no imbalance to demonstrate improved community negotiation    Baseline imbalance with head turns    Time 6    Period Weeks    Status Revised      PT LONG TERM GOAL #4   Title Patient will improve Berg Balance to >/= 50/56 to demonstrate reduced fall risk and improved balance    Baseline 45/56, 54/56    Time 6    Period Weeks    Status Achieved                 Plan - 04/02/20 1238    Clinical Impression Statement Began assessment of LTG today during session, patient able to meet LTG #4 today with [employer]. Patient scored 54/56 on Berg Balance Test today demonstrating improved balance. Continued session working on high level balance as tolerated by patient.  Will plan to finish asessment of LTG at next session and discharge, per patient request.    Personal Factors and Comorbidities Comorbidity 3+    Comorbidities hypertension, hyperlipidemia, depression/anxiety, degenerative disc disease in lumbar region, obesity, sleep apnea    Examination-Activity Limitations Bend;Transfers;Stairs;Stand;Lift    Examination-Participation Restrictions Occupation;Yard Work;Community Activity;Driving    Stability/Clinical Decision Making Evolving/Moderate complexity    Rehab Potential Good    PT Frequency 2x / week    PT Duration 6 weeks    PT Treatment/Interventions ADLs/Self Care Home Management;Aquatic Therapy;Cryotherapy;Electrical Stimulation;Moist Heat;DME Instruction;Gait training;Stair training;Functional mobility training;Therapeutic activities;Therapeutic exercise;Balance training;Neuromuscular re-education;Patient/family education;Orthotic Fit/Training;Manual techniques;Passive range of motion;Vestibular    PT Next Visit Plan Finish checking goals. Discharge planned for Fridays visit due to patient wanting to take break from PT services.    Consulted and Agree with Plan of Care Patient;Family member/caregiver    Family Member Consulted Wife           Patient will benefit from skilled therapeutic intervention in order to improve the following deficits and impairments:  Abnormal gait, Decreased balance, Decreased endurance, Difficulty walking, Impaired sensation, Dizziness, Decreased range of motion, Decreased activity tolerance, Decreased knowledge of use of DME, Decreased strength, Pain  Visit Diagnosis: Muscle weakness (generalized)  Unsteadiness on feet  Other abnormalities of gait and mobility  Foot drop, right     Problem List Patient Active Problem List   Diagnosis Date Noted   Transaminitis    PFO with atrial septal aneurysm 11/22/2019   Headache 11/22/2019   Cerebral edema (HCC) 11/22/2019   Chest pain 11/22/2019    Hyperlipidemia LDL goal <70 11/22/2019   OSA (obstructive sleep apnea) 11/22/2019   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) 11/22/2019   Vascular headache    Dyslipidemia    Hypertension  Intractable nausea and vomiting    Hypokalemia    Depression with anxiety    Ischemic stroke (HCC) - L PICA infarct embolic in setting of PFO s/p crani    Lumbar spondylolysis 07/31/2019   Lumbar radiculopathy 04/07/2019   Right foot drop 04/07/2019   Backache 07/05/2017   DDD (degenerative disc disease), lumbar 07/05/2017   Spinal stenosis of lumbar region 07/05/2017    Tempie Donning, PT, DPT 04/02/2020, 12:41 PM  [facility] [address] [place], [place], [address] Phone: [phone]   Fax:  [phone]  Name: RONELLE SMALLMAN MRN: [id number] Date of Birth: November 22, 1970

## 2020-04-05 ENCOUNTER — Ambulatory Visit: Payer: 59

## 2020-04-05 ENCOUNTER — Other Ambulatory Visit: Payer: Self-pay

## 2020-04-05 DIAGNOSIS — R2689 Other abnormalities of gait and mobility: Secondary | ICD-10-CM

## 2020-04-05 DIAGNOSIS — M21371 Foot drop, right foot: Secondary | ICD-10-CM

## 2020-04-05 DIAGNOSIS — M6281 Muscle weakness (generalized): Secondary | ICD-10-CM | POA: Diagnosis not present

## 2020-04-05 DIAGNOSIS — R2681 Unsteadiness on feet: Secondary | ICD-10-CM

## 2020-04-05 NOTE — Therapy (Signed)
[facility] [address] [place], [place], [address] Phone: [phone]   Fax:  [phone]  Physical Therapy Treatment  Patient Details  Name: [name] MRN: [id number] Date of Birth: Jul 23, 1970 Referring Provider (PT): Referred by: Lauraine Rinne, [name], being followed by: Alysia Penna, MD  PHYSICAL THERAPY DISCHARGE SUMMARY  Visits from Start of Care: 19  Current functional level related to goals / functional outcomes: See Clinical Impression Statement for Details   Remaining deficits: Decreased Balance; Decreased Toe Clearance   Education / Equipment: HEP/Walking Program  Plan: Patient agrees to discharge.  Patient goals were met. Patient is being discharged due to meeting the stated rehab goals.  ?????       Encounter Date: 04/05/2020   PT End of Session - 04/05/20 0935    Visit Number 19    Number of Visits 24    Date for PT Re-Evaluation 05/20/20   POC for 6 weeks, cert for 90 days   Authorization Type [employer]    PT Start Time [phone]    PT Stop Time [phone]    PT Time Calculation (min) 28 min    Equipment Utilized During Treatment Gait belt    Activity Tolerance Patient tolerated treatment well;No increased pain    Behavior During Therapy WFL for tasks assessed/performed           Past Medical History:  Diagnosis Date  . Arthritis    neck  . Carpal tunnel syndrome on both sides 08/2014  . DDD (degenerative disc disease), lumbar   . History of kidney stones   . Hypertension    under control with med., has been on med. x 1 yr.  . Sleep apnea    CPAP but does not use  . Stroke [facility]) 11/16/2019    Past Surgical History:  Procedure Laterality Date  . BUBBLE STUDY  11/21/2019   Procedure: BUBBLE STUDY;  Surgeon: Buford Dresser, MD;  Location: [place];  Service: Cardiovascular;;  . CARPAL TUNNEL RELEASE Left 09/07/2014   Procedure: LEFT CARPAL TUNNEL RELEASE;   Surgeon: Roseanne Kaufman, MD;  Location: [place];  Service: Orthopedics;  Laterality: Left;  . CARPAL TUNNEL RELEASE Right 10/12/2014   Procedure: RIGHT CARPAL TUNNEL RELEASE;  Surgeon: Roseanne Kaufman, MD;  Location: [facility];  Service: Orthopedics;  Laterality: Right;  . KNEE ARTHROSCOPY Left   . PATENT FORAMEN OVALE(PFO) CLOSURE N/A 01/31/2020   Procedure: PATENT FORAMEN OVALE (PFO) CLOSURE;  Surgeon: Sherren Mocha, MD;  Location: [place] CV LAB;  Service: Cardiovascular;  Laterality: N/A;  . SUBOCCIPITAL CRANIECTOMY CERVICAL LAMINECTOMY N/A 11/17/2019   Procedure: SUBOCCIPITAL DECOMPRESSIVE CRANIECTOMY;  Surgeon: Judith Part, MD;  Location: [place];  Service: Neurosurgery;  Laterality: N/A;  . TEE WITHOUT CARDIOVERSION N/A 11/21/2019   Procedure: TRANSESOPHAGEAL ECHOCARDIOGRAM (TEE);  Surgeon: Buford Dresser, MD;  Location: [facility] ENDOSCOPY;  Service: Cardiovascular;  Laterality: N/A;    There were no vitals filed for this visit.   Subjective Assessment - 04/05/20 0936    Subjective Patient reports no new changes/complaints. Reports he contines to make a conscuis effort to slow down his movements to avoid vertigo.    Patient is accompained by: Family member   Wife [name]   Pertinent History hypertension, hyperlipidemia, depression/anxiety, degenerative disc disease in lumbar region, obesity, sleep apnea    Limitations Walking;Standing    Patient Stated Goals Back to Normal, Do the things I did prior    Currently in  Pain? Yes    Pain Score 6     Pain Location Back    Pain Orientation Lower    Pain Descriptors / Indicators Aching;Sore    Pain Type Chronic pain    Pain Onset More than a month ago              [facility] Adult PT Treatment/Exercise - 04/05/20 0001      Ambulation/Gait   Ambulation/Gait Yes    Ambulation/Gait Assistance 6: Modified independent (Device/Increase time)    Ambulation/Gait Assistance Details completed ambulation  on unlevel outdoors surfaces without AD and Mod I level x 1300 ft. No instances of imbalance noted with ambulation on unlevel surfaces including paved, grass. Patient demo improved activity tolerance and balance overall.     Ambulation Distance (Feet) 200 Feet   x 1, 1300 x 1   Assistive device None    Gait Pattern Step-through pattern;Decreased step length - left;Decreased stance time - right;Decreased dorsiflexion - right;Right steppage;Wide base of support;Poor foot clearance - right    Ambulation Surface Level;Indoor;Unlevel;Outdoor;Paved;Grass    Gait Comments completed ambulation x 200 ft with horizontal head turns to promote improved scanning environment, no LOB or stumbles notices, and no increase in symptoms.              Completed entire verbal review of current HEP and educated on compliance upon discharge:  Access Code: [email or web address] URL: [email or web address] Date: 03/25/2020 Prepared by: Baldomero Lamy  Exercises Romberg Stance with Head Rotation - 1 x daily - 5 x weekly - 2 sets - 10 reps Romberg Stance with Head Nods - 1 x daily - 5 x weekly - 2 sets - 10 reps Standing with Feet Apart and Eyes Closed - 1 x daily - 5 x weekly - 1 sets - 3 reps - 30 hold Standing Heel Raise with Support - 1 x daily - 5 x weekly - 2 sets - 10 reps Seated Toe Raise - 1 x daily - 5 x weekly - 2 sets - 10 reps Standing March with Counter Support - 1 x daily - 5 x weekly - 2 sets - 10 reps Walking with Head Rotation - 1 x daily - 5 x weekly - 3 sets - 10 reps Walk Turns - 1 x daily - 5 x weekly - 2 sets - 10 reps       PT Short Term Goals - 02/20/20 1329      PT SHORT TERM GOAL #1   Title STG = LTG             PT Long Term Goals - 04/05/20 0949      PT LONG TERM GOAL #1   Title Patient will be independent with progressive HEP for balance/coordination (All LTG's due: 04/02/20)    Baseline independence with HEP    Time 6    Period Weeks    Status Achieved      PT  LONG TERM GOAL #2   Title Patient will demonstrate ability to ambulate >1000 on indoor/outdoor surfaces without AD, and supervision for improved community mobility    Baseline 1300 ft Mod I no AD on outdoor surfaces    Time 6    Period Weeks    Status Achieved      PT LONG TERM GOAL #3   Title Patient will demo ability to ambulate > 115 ft with scanning environment and no imbalance to demonstrate improved community negotiation    Baseline 200 ft no imbalance with  head turns    Time 6    Period Weeks    Status Achieved      PT LONG TERM GOAL #4   Title Patient will improve Berg Balance to >/= 50/56 to demonstrate reduced fall risk and improved balance    Baseline 45/56, 54/56    Time 6    Period Weeks    Status Achieved                 Plan - 04/05/20 1001    Clinical Impression Statement Finished assressment of patient's progress toward all LTG. Patient able to meet all LTGs today during session, demonstrating improved tolerance for ambulation on outdoor unlevel surfaces and scannign environemtn with no instances of imbalance. Reviewed current HEP to ensure proper compliance and completion. Patient has made siginificant progress with PT services inlcuding improved gait, improved balance, and reduced risk for falls. Patient verbalizing feeling very satisifed with current functional level. PT verbalizing readiness for discharge, with patient verbalizing agreement.    Personal Factors and Comorbidities Comorbidity 3+    Comorbidities hypertension, hyperlipidemia, depression/anxiety, degenerative disc disease in lumbar region, obesity, sleep apnea    Examination-Activity Limitations Bend;Transfers;Stairs;Stand;Lift    Examination-Participation Restrictions Occupation;Yard Work;Community Activity;Driving    Stability/Clinical Decision Making Evolving/Moderate complexity    Rehab Potential Good    PT Frequency 2x / week    PT Duration 6 weeks    PT Treatment/Interventions ADLs/Self  Care Home Management;Aquatic Therapy;Cryotherapy;Electrical Stimulation;Moist Heat;DME Instruction;Gait training;Stair training;Functional mobility training;Therapeutic activities;Therapeutic exercise;Balance training;Neuromuscular re-education;Patient/family education;Orthotic Fit/Training;Manual techniques;Passive range of motion;Vestibular    Consulted and Agree with Plan of Care Patient;Family member/caregiver    Family Member Consulted Wife           Patient will benefit from skilled therapeutic intervention in order to improve the following deficits and impairments:  Abnormal gait, Decreased balance, Decreased endurance, Difficulty walking, Impaired sensation, Dizziness, Decreased range of motion, Decreased activity tolerance, Decreased knowledge of use of DME, Decreased strength, Pain  Visit Diagnosis: Muscle weakness (generalized)  Unsteadiness on feet  Foot drop, right  Other abnormalities of gait and mobility     Problem List Patient Active Problem List   Diagnosis Date Noted  . Transaminitis   . PFO with atrial septal aneurysm 11/22/2019  . Headache 11/22/2019  . Cerebral edema ([place]) 11/22/2019  . Chest pain 11/22/2019  . Hyperlipidemia LDL goal <70 11/22/2019  . OSA (obstructive sleep apnea) 11/22/2019  . 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 ([place]) 11/22/2019  . Vascular headache   . Dyslipidemia   . Hypertension   . Intractable nausea and vomiting   . Hypokalemia   . Depression with anxiety   . Ischemic stroke ([place]) - L PICA infarct embolic in setting of PFO s/p crani   . Lumbar spondylolysis 07/31/2019  . Lumbar radiculopathy 04/07/2019  . Right foot drop 04/07/2019  . Backache 07/05/2017  . DDD (degenerative disc disease), lumbar 07/05/2017  . Spinal stenosis of lumbar region 07/05/2017    Jones Bales, PT, DPT 04/05/2020, 10:07 AM  [facility] [address] [place], [place], [address] Phone: [phone]   Fax:  [phone]  Name: [name] MRN: [id number] Date of Birth: 08-30-1970

## 2020-04-08 ENCOUNTER — Encounter: Payer: Self-pay | Admitting: Psychology

## 2020-04-08 NOTE — Progress Notes (Signed)
Neuropsychology Visit  Patient:  [name]   DOB: 1970-07-05  MR Number: [id number]  Location: [facility] FOR PAIN AND [facility] MEDICINE [facility] PHYSICAL MEDICINE AND REHABILITATION [address] [place], [address] [id number] [facility] [place] [address] Dept: [phone]  Date of Service: 03/28/2020  Start: 2 PM End: 3 PM  Today's visit was an in person visit that was conducted in my outpatient clinic office.  The patient myself were present.  Duration of Service: 1 Hour  Provider/Observer:     Hershal Coria PsyD  Chief Complaint:      Chief Complaint  Patient presents with  . Depression  . Anxiety  . Pain  . Other    Gait disturbance and balance issues likely related to his cerebellar embolic infarction.    Reason For Service:     Milt Coye is a 49 year old male referred by [name], MD for neuropsychological consultation due to coping and adjustment issues with significant residual motor deficits and coordination deficits of his cerebrovascular accident.  The patient has a prior medical history including hypertension, sleep apnea, hyperlipidemia, depression with anxiety, obesity with BMI 39.68.  Patient presented on 11/16/2019 with dizziness and gait abnormalities.  MRI showed a 1.1 cm acute left PICA territory ischemic infarction within the left cerebral hemisphere also impacting cerebellar regions.  Additional punctuate acute infarcts were noted in the left occipital lobe.  Follow-up CT imaging showed brainstem compression with obstructive hydrocephalus with patient undergoing suboccipital craniotomy on 11/17/2019.  Patient subsequently was treated in the comprehensive inpatient rehabilitation program and is also followed up with outpatient OT/PT therapies with patient reaching his goals in PT.  The patient is continued to have fatigue, diffuse body pains, significant motor deficits including deficits in coordination and gait changes as well.  The  patient has not been able to return to work as his job as a Psychologist, occupational was a very physical job in his motor coordination deficits of left that unattainable at this point.  The patient continues with significant pain, depression and agitation.  The patient has been started on a number of medications with the most recent addition of Cymbalta started 3 weeks ago with some significant improvement particular around agitation and reactive emotional responses.  The patient also takes Klonopin at times for anxiety and distress.  The patient takes Topamax for ongoing headaches.  The patient reports that every day is a struggle for him.  The patient reports that he wakes up in the morning feeling significant body pain and it takes him a while to oriented to his surroundings and get going due to the pain.  He reports that he did have some pain before but this is much worse than everything is amplified post cerebrovascular event.  The patient's wife reports that she has seen progression in his overall function and now is walking much better but is very mechanical in his gait.  The patient continues to have significant balance and coordination issues.  Another major issue is ongoing sleep disturbance.  The patient reports that he has no normality to his sleep patterns.  He has been diagnosed with obstructive sleep apnea and uses a CPAP machine and has become much better as far as his consistency.  Appetite is returned to baseline.  He does acknowledge changes in his memory but the biggest cognitive changes a change in his loss of keeping up with time and relationships of time frame.  There is also report of changes in sense of  taste or smell.  Treatment Interventions:  Cognitive/behavioral therapeutic interventions and working on coping skills and strategies around residual effects of his cerebrovascular accident.  Participation Level:   Active  Participation Quality:  Appropriate      Behavioral Observation:  Well  Groomed, Alert, and Appropriate.   Current Psychosocial Factors: The patient reports that he is still struggling somewhat with regard to coping with gait changes and motor deficits which are having a significant impact on his day-to-day functioning.  Content of Session:   Reviewed current symptoms and continue to work on therapeutic interventions around residual effects of his ischemic stroke.  Effectiveness of Interventions: The patient was an active participant in these therapeutic interventions and we worked on foundational issues as well as various coping skills and strategies.  Target Goals:   Improve the patient is coping and adjustment to residual effects of his ischemic stroke impacting cerebellar regions.  Goals Last Reviewed:   03/28/2020  Goals Addressed Today:    Today we worked on foundational issues related to coping and adjustment, sleep hygiene issues and other behavioral interventions.  Impression/Diagnosis:   Tushar Enns is a 49 year old male referred by [name], MD for neuropsychological consultation due to coping and adjustment issues with significant residual motor deficits and coordination deficits of his cerebrovascular accident.  The patient has a prior medical history including hypertension, sleep apnea, hyperlipidemia, depression with anxiety, obesity with BMI 39.68.  Patient presented on 11/16/2019 with dizziness and gait abnormalities.  MRI showed a 1.1 cm acute left PICA territory ischemic infarction within the left cerebral hemisphere also impacting cerebellar regions.  Additional punctuate acute infarcts were noted in the left occipital lobe.  Follow-up CT imaging showed brainstem compression with obstructive hydrocephalus with patient undergoing suboccipital craniotomy on 11/17/2019.  Patient subsequently was treated in the comprehensive inpatient rehabilitation program and is also followed up with outpatient OT/PT therapies with patient reaching his goals in  PT.  The patient is continued to have fatigue, diffuse body pains, significant motor deficits including deficits in coordination and gait changes as well.  The patient has not been able to return to work as his job as a Psychologist, occupational was a very physical job in his motor coordination deficits of left that unattainable at this point.  The patient continues with significant pain, depression and agitation.  Diagnosis:   Ischemic stroke (HCC) - L PICA infarct embolic in setting of PFO s/p crani  Cognitive deficit, post-stroke  Adjustment reaction with anxiety and depression    Arley Phenix, Psy.D. Clinical Psychologist Neuropsychologist

## 2020-04-08 NOTE — Progress Notes (Signed)
I agree with the above plan 

## 2020-04-16 ENCOUNTER — Encounter (HOSPITAL_COMMUNITY): Payer: Self-pay | Admitting: Emergency Medicine

## 2020-04-16 ENCOUNTER — Emergency Department (HOSPITAL_COMMUNITY)
Admission: EM | Admit: 2020-04-16 | Discharge: 2020-04-16 | Disposition: A | Discharge status: Home / Self Care | Payer: 59 | Attending: Emergency Medicine | Admitting: Emergency Medicine

## 2020-04-16 ENCOUNTER — Other Ambulatory Visit: Payer: Self-pay

## 2020-04-16 ENCOUNTER — Emergency Department (HOSPITAL_COMMUNITY): Payer: 59

## 2020-04-16 DIAGNOSIS — Z79899 Other long term (current) drug therapy: Secondary | ICD-10-CM | POA: Diagnosis not present

## 2020-04-16 DIAGNOSIS — N12 Tubulo-interstitial nephritis, not specified as acute or chronic: Secondary | ICD-10-CM | POA: Insufficient documentation

## 2020-04-16 DIAGNOSIS — N200 Calculus of kidney: Secondary | ICD-10-CM

## 2020-04-16 DIAGNOSIS — I10 Essential (primary) hypertension: Secondary | ICD-10-CM | POA: Diagnosis not present

## 2020-04-16 DIAGNOSIS — Z8673 Personal history of transient ischemic attack (TIA), and cerebral infarction without residual deficits: Secondary | ICD-10-CM | POA: Insufficient documentation

## 2020-04-16 DIAGNOSIS — Z7982 Long term (current) use of aspirin: Secondary | ICD-10-CM | POA: Insufficient documentation

## 2020-04-16 DIAGNOSIS — R109 Unspecified abdominal pain: Secondary | ICD-10-CM | POA: Diagnosis present

## 2020-04-16 DIAGNOSIS — R319 Hematuria, unspecified: Secondary | ICD-10-CM | POA: Insufficient documentation

## 2020-04-16 DIAGNOSIS — Z7902 Long term (current) use of antithrombotics/antiplatelets: Secondary | ICD-10-CM | POA: Insufficient documentation

## 2020-04-16 LAB — BASIC METABOLIC PANEL
Anion gap: 14 (ref 5–15)
BUN: 10 mg/dL (ref 6–20)
CO2: 20 mmol/L — ABNORMAL LOW (ref 22–32)
Calcium: 9.4 mg/dL (ref 8.9–10.3)
Chloride: 108 mmol/L (ref 98–111)
Creatinine, Ser: 1.17 mg/dL (ref 0.61–1.24)
GFR, Estimated: >60 mL/min (ref >60)
Glucose, Bld: 139 mg/dL — ABNORMAL HIGH (ref 70–99)
Potassium: 3.8 mmol/L (ref 3.5–5.1)
Sodium: 142 mmol/L (ref 135–145)

## 2020-04-16 LAB — CBC
HCT: 47.2 % (ref 39.0–52.0)
Hemoglobin: 15.9 g/dL (ref 13.0–17.0)
MCH: 31.2 pg (ref 26.0–34.0)
MCHC: 33.7 g/dL (ref 30.0–36.0)
MCV: 92.7 fL (ref 80.0–100.0)
Platelets: 237 10*3/uL (ref 150–400)
RBC: 5.09 MIL/uL (ref 4.22–5.81)
RDW: 11.7 % (ref 11.5–15.5)
WBC: 9.9 10*3/uL (ref 4.0–10.5)
nRBC: 0 % (ref 0.0–0.2)

## 2020-04-16 LAB — URINALYSIS, MICROSCOPIC (REFLEX): RBC / HPF: >50 RBC/hpf (ref 0–5)

## 2020-04-16 LAB — URINALYSIS, ROUTINE W REFLEX MICROSCOPIC

## 2020-04-16 IMAGING — CT CT RENAL STONE PROTOCOL
2 of 4 series · 16 of 46 positions shown, 18 images · non-contrast
Comparison: CT abdomen pelvis [DATE].

CLINICAL DATA: Flank pain kidney stone suspected.

EXAM:
CT ABDOMEN AND PELVIS WITHOUT CONTRAST
TECHNIQUE: Multidetector CT imaging of the abdomen and pelvis was performed
following the standard protocol without IV contrast.

[Series 3: ap without · axial · non-contrast · 0.89mm/px · z∈[+991,+1421]mm · 13 of 98 slices shown, 15 images]
[im 6/98  soft-tissue]
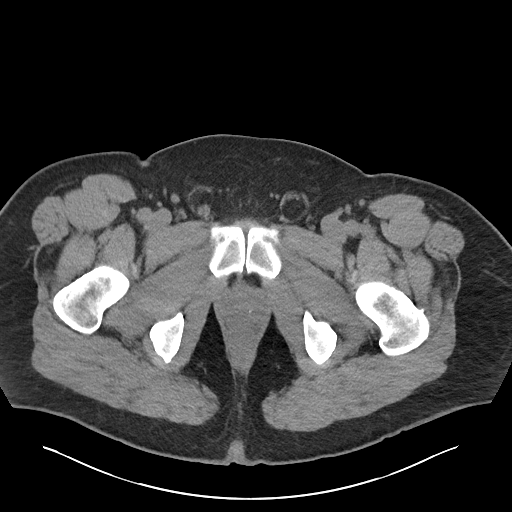
[im 6/98  bone]
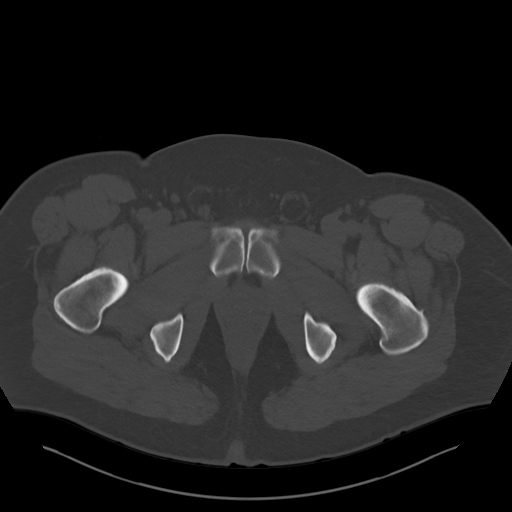
[im 11/98  soft-tissue]
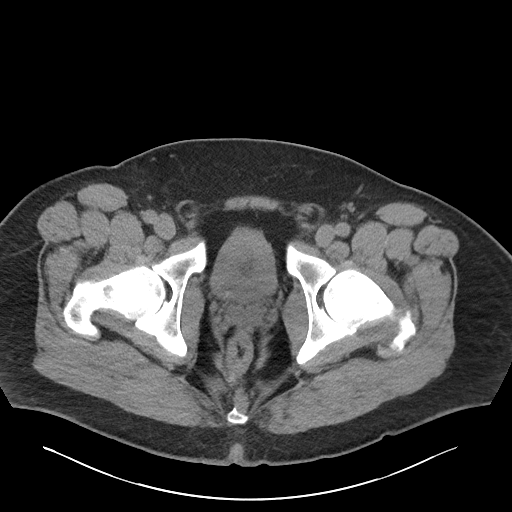
[im 22/98  soft-tissue]
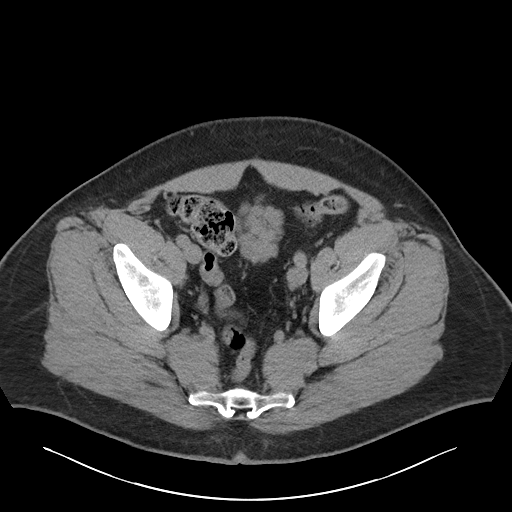
[im 27/98  soft-tissue]
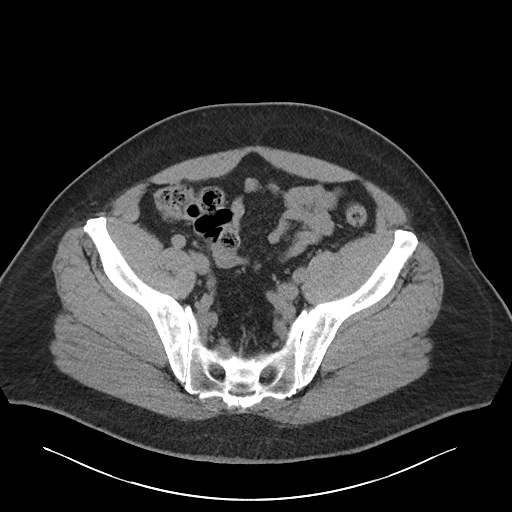
[im 33/98  soft-tissue]
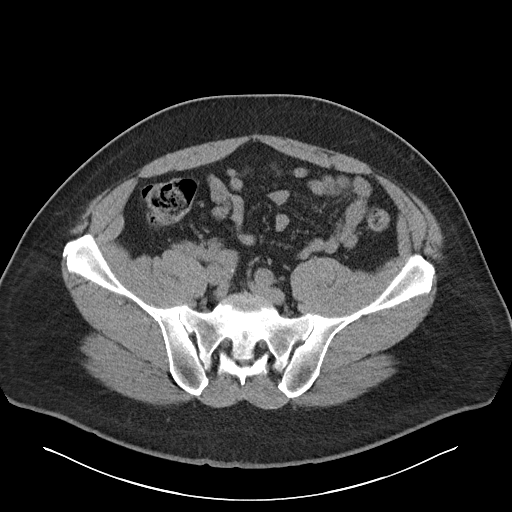
[im 44/98  soft-tissue]
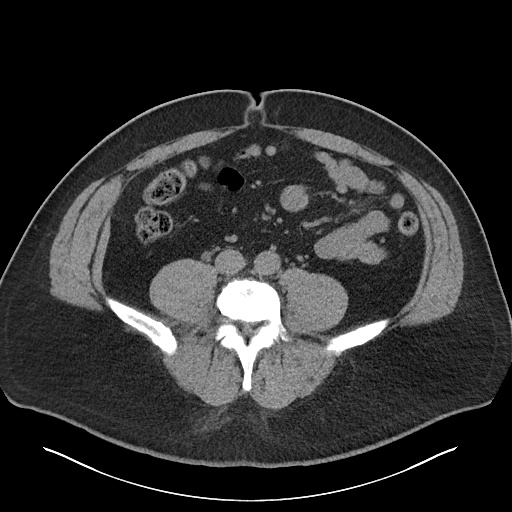
[im 49/98  soft-tissue]
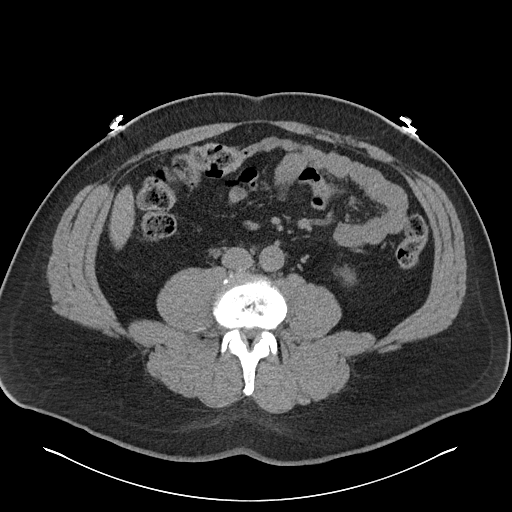
[im 54/98  soft-tissue]
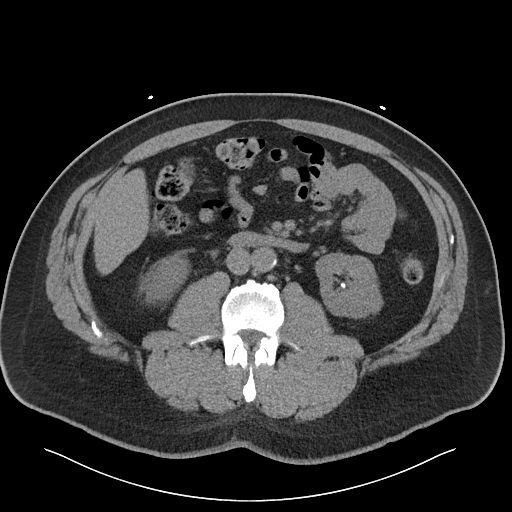
[im 65/98  soft-tissue]
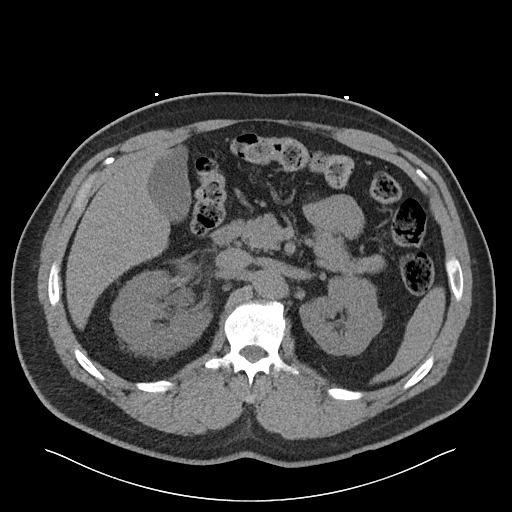
[im 65/98  bone]
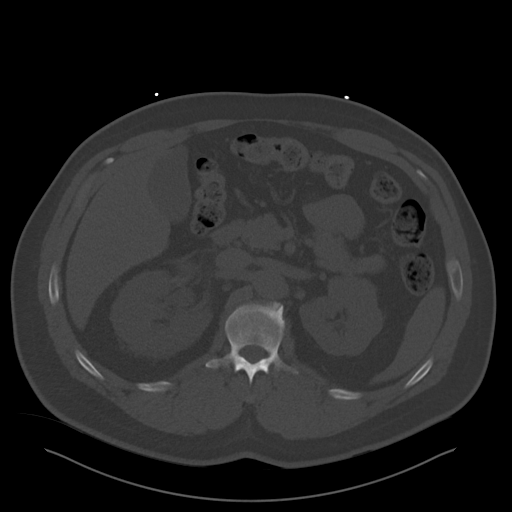
[im 71/98  soft-tissue]
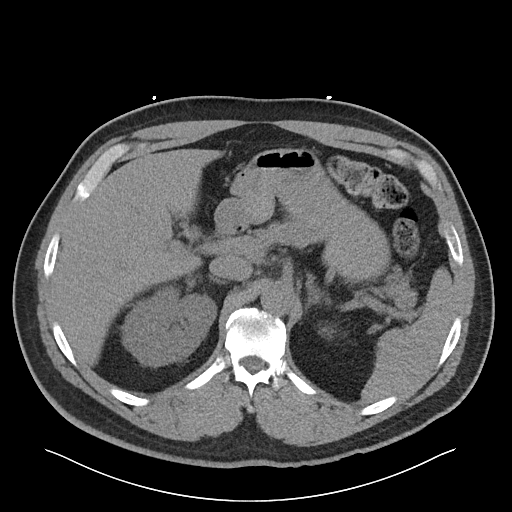
[im 76/98  soft-tissue]
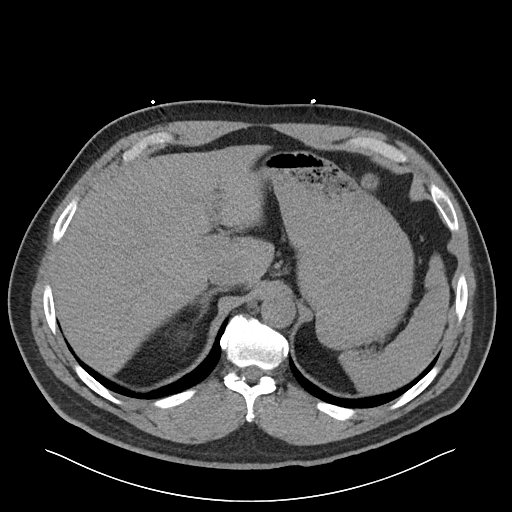
[im 87/98  soft-tissue]
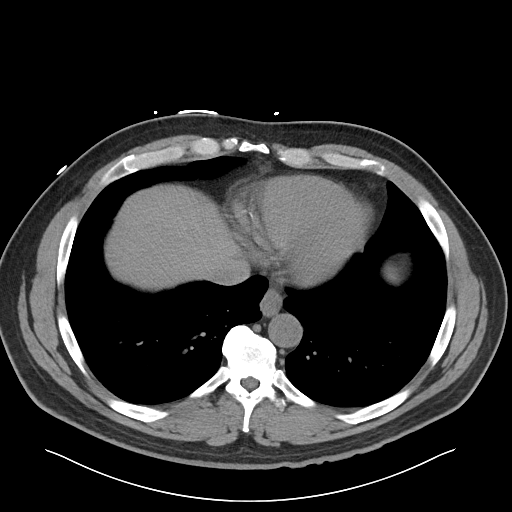
[im 92/98  soft-tissue]
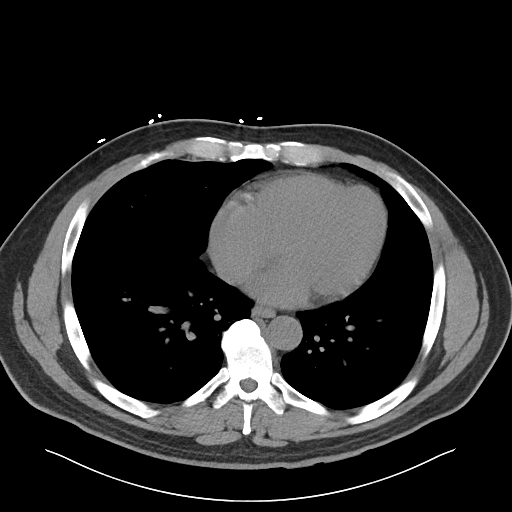

[Series 6: cor · coronal · 0.98mm/px · 3 of 114 slices shown]
[im 38/114  soft-tissue]
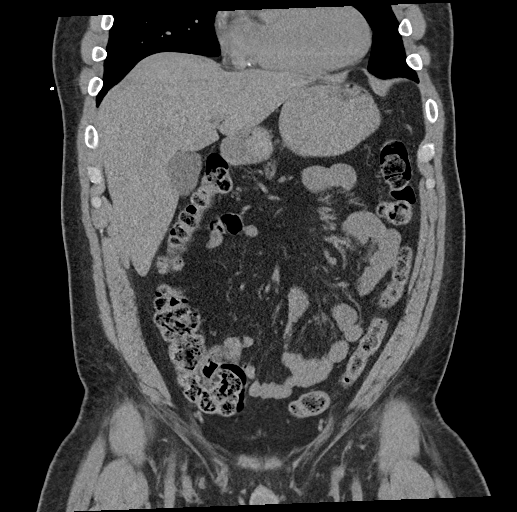
[im 51/114  soft-tissue]
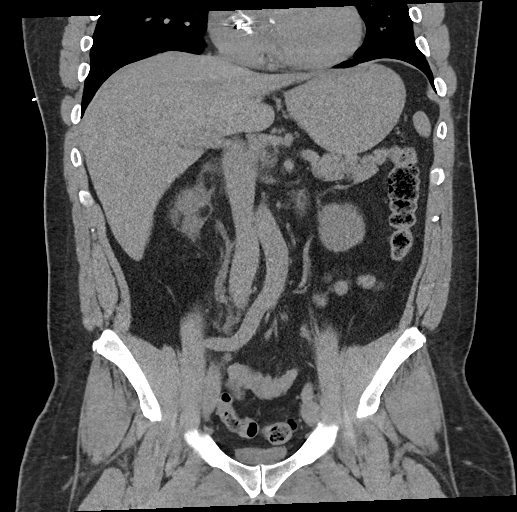
[im 63/114  soft-tissue]
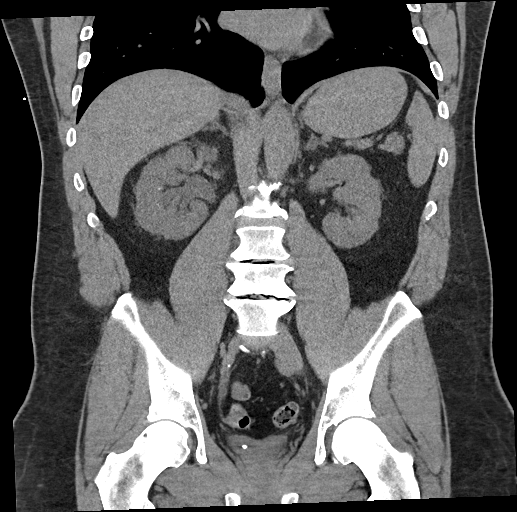

[16 of 46 positions shown; findings below may reference images not displayed]

FINDINGS: Lower chest: Mild bibasilar subsegmental atelectasis. Similar
parenchymal scarring in the paramedian right lower lobe overly the
flowing anterior vertebral body osteophytes.

Hepatobiliary: Scattered hepatic cysts. No suspicious hepatic
lesions on this noncontrast exam. The gallbladder is unremarkable.
No biliary ductal dilatation.

Pancreas: Unremarkable. No pancreatic ductal dilatation or
surrounding inflammatory changes.

Spleen: Normal in size without focal abnormality.

Adrenals/Urinary Tract: Similar mild left adrenal thickening without
dominant nodule. Normal right adrenal gland.

There is 6 mm renal stone in the right UVJ with mild right-sided
hydroureteronephrosis. Edematous appearance of the right kidney. No
left hydronephrosis. There are multiple other bilateral
nonobstructive renal stones the largest on the right is in the lower
pole and measures 5 mm (series 3, image 46) and the largest on the
right is in the interpolar region measures 4 mm (series 3, image
38).

Stomach/Bowel: Stomach is distended with ingested contents. Appendix
appears normal. There are a few scattered colonic diverticula
without findings of diverticulitis. No evidence of bowel wall
thickening, distention, or inflammatory changes.

Vascular/Lymphatic: Aortic atherosclerosis. No enlarged abdominal or
pelvic lymph nodes.

Reproductive: Prostatic calcifications

Other: Small fat containing umbilical hernia. Small fat containing
inguinal hernias.

Musculoskeletal: No suspicious lytic or blastic lesions of bone.
Multilevel degenerative change of the lumbar spine.
IMPRESSION: 1. At least partially obstructive 6 mm renal stone in the right UVJ
with mild upstream hydroureteronephrosis.
2. Multiple other bilateral nonobstructive renal stones the largest
on the left measuring 5 mm on the right 4 mm.
3. Aortic atherosclerosis.

Aortic Atherosclerosis ([A4]-[A4]).

These results were called by telephone at the time of interpretation
on [DATE] at [DATE] to provider Dr CADET GARRY, who verbally
acknowledged these results.

## 2020-04-16 MED ORDER — OXYCODONE-ACETAMINOPHEN 5-325 MG PO TABS
1.0000 | ORAL_TABLET | Freq: Once | ORAL | Status: AC
  Administered 2020-04-16: 1 via ORAL
  Filled 2020-04-16: qty 1

## 2020-04-16 MED ORDER — HYDROMORPHONE HCL 1 MG/ML IJ SOLN
1.0000 mg | Freq: Once | INTRAMUSCULAR | Status: AC
  Administered 2020-04-16: 1 mg via INTRAVENOUS
  Filled 2020-04-16: qty 1

## 2020-04-16 MED ORDER — OXYCODONE-ACETAMINOPHEN 5-325 MG PO TABS
1.0000 | ORAL_TABLET | Freq: Four times a day (QID) | ORAL | 0 refills | Status: DC | PRN
Start: 2020-04-16 — End: 2020-06-28

## 2020-04-16 MED ORDER — ONDANSETRON HCL 4 MG/2ML IJ SOLN
4.0000 mg | Freq: Once | INTRAMUSCULAR | Status: AC
  Administered 2020-04-16: 4 mg via INTRAVENOUS
  Filled 2020-04-16: qty 2

## 2020-04-16 MED ORDER — ONDANSETRON HCL 4 MG PO TABS: 4.0000 mg | ORAL_TABLET | Freq: Three times a day (TID) | ORAL | 0 refills | Status: DC | PRN

## 2020-04-16 MED ORDER — LORAZEPAM 2 MG/ML IJ SOLN
1.0000 mg | Freq: Once | INTRAMUSCULAR | Status: AC
  Administered 2020-04-16: 1 mg via INTRAVENOUS
  Filled 2020-04-16: qty 1

## 2020-04-16 NOTE — ED Notes (Signed)
PT at CT

## 2020-04-16 NOTE — ED Provider Notes (Signed)
[facility] EMERGENCY DEPARTMENT Provider Note   CSN: [id number] Arrival date & time: 04/16/20  1200     History Chief Complaint  Patient presents with  . Flank Pain    [name] is a 49 y.o. male.  The history is provided by the patient and medical records. No language interpreter was used.  Flank Pain     49 year old male with prior stroke currently on Plavix, history of kidney stones, presenting for evaluation of flank pain.  Patient report he developed acute onset of right flank pain earlier today.  Pain is very intense, sharp shooting, nothing makes it better or worse.  He became sweaty, very nauseous, vomiting multiple episodes and feeling lightheaded.  He did notice blood in his urine.  He denies any strength activity at the onset of his symptoms.  He did notice that his urine had an odd smell for the past several days but denies burning sensation when urinating.  When EMS arrived, patient did receive 250 mcg of fentanyl and 4 mg of Zofran on route.  It provide minimal improvement.   Past Medical History:  Diagnosis Date  . Arthritis    neck  . Carpal tunnel syndrome on both sides 08/2014  . DDD (degenerative disc disease), lumbar   . History of kidney stones   . Hypertension    under control with med., has been on med. x 1 yr.  . Sleep apnea    CPAP but does not use  . Stroke [facility]) 11/16/2019    Patient Active Problem List   Diagnosis Date Noted  . Transaminitis   . PFO with atrial septal aneurysm 11/22/2019  . Headache 11/22/2019  . Cerebral edema (HCC) 11/22/2019  . Chest pain 11/22/2019  . Hyperlipidemia LDL goal <70 11/22/2019  . OSA (obstructive sleep apnea) 11/22/2019  . 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 Vascular headache   . Dyslipidemia   . Hypertension   . Intractable nausea and vomiting   . Hypokalemia   . Depression with anxiety   . Ischemic stroke (HCC) - L PICA infarct  embolic in setting of PFO s/p crani   . Lumbar spondylolysis 07/31/2019  . Lumbar radiculopathy 04/07/2019  . Right foot drop 04/07/2019  . Backache 07/05/2017  . DDD (degenerative disc disease), lumbar 07/05/2017  . Spinal stenosis of lumbar region 07/05/2017    Past Surgical History:  Procedure Laterality Date  . BUBBLE STUDY  11/21/2019   Procedure: BUBBLE STUDY;  Surgeon: [name], MD;  Location: [facility] ENDOSCOPY;  Service: Cardiovascular;;  . CARPAL TUNNEL RELEASE Left 09/07/2014   Procedure: LEFT CARPAL TUNNEL RELEASE;  Surgeon: [name], MD;  Location: [facility];  Service: Orthopedics;  Laterality: Left;  . CARPAL TUNNEL RELEASE Right 10/12/2014   Procedure: RIGHT CARPAL TUNNEL RELEASE;  Surgeon: [name], MD;  Location: [facility];  Service: Orthopedics;  Laterality: Right;  . KNEE ARTHROSCOPY Left   . PATENT FORAMEN OVALE(PFO) CLOSURE N/A 01/31/2020   Procedure: PATENT FORAMEN OVALE (PFO) CLOSURE;  Surgeon: [name], MD;  Location: [facility] INVASIVE CV LAB;  Service: Cardiovascular;  Laterality: N/A;  . SUBOCCIPITAL CRANIECTOMY CERVICAL LAMINECTOMY N/A 11/17/2019   Procedure: SUBOCCIPITAL DECOMPRESSIVE CRANIECTOMY;  Surgeon: [name], MD;  Location: [facility] OR;  Service: Neurosurgery;  Laterality: N/A;  . TEE WITHOUT CARDIOVERSION N/A 11/21/2019   Procedure: TRANSESOPHAGEAL ECHOCARDIOGRAM (TEE);  Surgeon: [name], MD;  Location: [facility] ENDOSCOPY;  Service: Cardiovascular;  Laterality: N/A;       Family History  Problem Relation Age of Onset  . Heart attack Mother     Social History   Tobacco Use  . Smoking status: Never Smoker  . Smokeless tobacco: Never Used  Vaping Use  . Vaping Use: Former  Substance Use Topics  . Alcohol use: Not Currently    Comment: quit 2006  . Drug use: No    Home Medications Prior to Admission medications   Medication Sig Start Date End Date Taking? Authorizing Provider    aspirin EC 81 MG EC tablet Take 1 tablet (81 mg total) by mouth daily. Swallow whole. 11/23/19   Layne Benton, NP  atorvastatin (LIPITOR) 10 MG tablet Take 10 mg by mouth daily.    [provider]  clonazePAM (KLONOPIN) 0.5 MG tablet Take 1 tablet (0.5 mg total) by mouth 2 (two) times daily as needed for anxiety (anxiety, sleep, dizziness). 04/02/20   Ihor Austin, NP  clopidogrel (PLAVIX) 75 MG tablet Take 1 tablet (75 mg total) by mouth daily. 02/28/20 07/30/20  Janetta Hora, [name]  cyanocobalamin (,VITAMIN B-12,) 1000 MCG/ML injection Inject 1,000 mcg into the muscle once a week. 01/26/20   [provider]  DULoxetine (CYMBALTA) 30 MG capsule Take 1 capsule (30 mg total) by mouth daily. 02/23/20   Kirsteins, Victorino Sparrow, MD  losartan (COZAAR) 50 MG tablet Take 50 mg by mouth daily.    [provider]  meclizine (ANTIVERT) 25 MG tablet TAKE 1 TABLET(25 MG) BY MOUTH THREE TIMES DAILY AS NEEDED FOR DIZZINESS OR NAUSEA 02/29/20   Kirsteins, Victorino Sparrow, MD    Allergies    Patient has no known allergies.  Review of Systems   Review of Systems  Genitourinary: Positive for flank pain.  All other systems reviewed and are negative.   Physical Exam Updated Vital Signs BP (!) 171/121 (BP Location: Left Arm)   Pulse 82   Temp (!) 97.5 F (36.4 C) (Oral)   Resp 16   SpO2 97%   Physical Exam Vitals and nursing note reviewed.  Constitutional:      Appearance: He is well-developed. He is obese. He is diaphoretic.     Comments: Obese male, diaphoretic, appears very uncomfortable and actively vomiting.  HENT:     Head: Atraumatic.  Eyes:     Conjunctiva/sclera: Conjunctivae normal.  Cardiovascular:     Rate and Rhythm: Normal rate and regular rhythm.     Pulses: Normal pulses.     Heart sounds: Normal heart sounds.  Pulmonary:     Effort: Pulmonary effort is normal.     Breath sounds: Normal breath sounds.  Abdominal:     Palpations: Abdomen is soft.      Tenderness: There is no abdominal tenderness. There is right CVA tenderness. There is no left CVA tenderness.  Musculoskeletal:     Cervical back: Neck supple.  Skin:    Findings: No rash.  Neurological:     Mental Status: He is alert. Mental status is at baseline.     ED Results / Procedures / Treatments   Labs (all labs ordered are listed, but only abnormal results are displayed) Labs Reviewed  URINALYSIS, ROUTINE W REFLEX MICROSCOPIC - Abnormal; Notable for the following components:      Result Value   Color, Urine RED (*)    APPearance TURBID (*)    Glucose, UA   (*)    Value: TEST NOT REPORTED DUE TO COLOR INTERFERENCE  OF URINE PIGMENT   Hgb urine dipstick   (*)    Value: TEST NOT REPORTED DUE TO COLOR INTERFERENCE OF URINE PIGMENT   Bilirubin Urine   (*)    Value: TEST NOT REPORTED DUE TO COLOR INTERFERENCE OF URINE PIGMENT   Ketones, ur   (*)    Value: TEST NOT REPORTED DUE TO COLOR INTERFERENCE OF URINE PIGMENT   Protein, ur   (*)    Value: TEST NOT REPORTED DUE TO COLOR INTERFERENCE OF URINE PIGMENT   Nitrite   (*)    Value: TEST NOT REPORTED DUE TO COLOR INTERFERENCE OF URINE PIGMENT   Leukocytes,Ua   (*)    Value: TEST NOT REPORTED DUE TO COLOR INTERFERENCE OF URINE PIGMENT   All other components within normal limits  BASIC METABOLIC PANEL - Abnormal; Notable for the following components:   CO2 20 (*)    Glucose, Bld 139 (*)    All other components within normal limits  URINALYSIS, MICROSCOPIC (REFLEX) - Abnormal; Notable for the following components:   Bacteria, UA MANY (*)    All other components within normal limits  CBC    EKG None  Radiology CT Renal Stone Study  Result Date: 04/16/2020 CLINICAL DATA:  Flank pain kidney stone suspected. EXAM: CT ABDOMEN AND PELVIS WITHOUT CONTRAST TECHNIQUE: Multidetector CT imaging of the abdomen and pelvis was performed following the standard protocol without IV contrast. COMPARISON:  CT abdomen pelvis July 05, 2019. FINDINGS: Lower chest: Mild bibasilar subsegmental atelectasis. Similar parenchymal scarring in the paramedian right lower lobe overly the flowing anterior vertebral body osteophytes. Hepatobiliary: Scattered hepatic cysts. No suspicious hepatic lesions on this noncontrast exam. The gallbladder is unremarkable. No biliary ductal dilatation. Pancreas: Unremarkable. No pancreatic ductal dilatation or surrounding inflammatory changes. Spleen: Normal in size without focal abnormality. Adrenals/Urinary Tract: Similar mild left adrenal thickening without dominant nodule. Normal right adrenal gland. There is 6 mm renal stone in the right UVJ with mild right-sided hydroureteronephrosis. Edematous appearance of the right kidney. No left hydronephrosis. There are multiple other bilateral nonobstructive renal stones the largest on the right is in the lower pole and measures 5 mm (series 3, image 46) and the largest on the right is in the interpolar region measures 4 mm (series 3, image 38). Stomach/Bowel: Stomach is distended with ingested contents. Appendix appears normal. There are a few scattered colonic diverticula without findings of diverticulitis. No evidence of bowel wall thickening, distention, or inflammatory changes. Vascular/Lymphatic: Aortic atherosclerosis. No enlarged abdominal or pelvic lymph nodes. Reproductive: Prostatic calcifications Other: Small fat containing umbilical hernia. Small fat containing inguinal hernias. Musculoskeletal: No suspicious lytic or blastic lesions of bone. Multilevel degenerative change of the lumbar spine. IMPRESSION: 1. At least partially obstructive 6 mm renal stone in the right UVJ with mild upstream hydroureteronephrosis. 2. Multiple other bilateral nonobstructive renal stones the largest on the left measuring 5 mm on the right 4 mm. 3. Aortic atherosclerosis. Aortic Atherosclerosis (ICD10-I70.0). These results were called by telephone at the time of interpretation on  04/16/2020 at 2:58 pm to provider Dr Charm BargesButler, who verbally acknowledged these results. Electronically Signed   By: Maudry MayhewJeffrey  Waltz MD   On: 04/16/2020 14:59    Procedures Procedures (including critical care time)  Medications Ordered in ED Medications  oxyCODONE-acetaminophen (PERCOCET/ROXICET) 5-325 MG per tablet 1 tablet (has no administration in time range)  HYDROmorphone (DILAUDID) injection 1 mg (1 mg Intravenous Given 04/16/20 1231)  ondansetron (ZOFRAN) injection 4 mg (4 mg Intravenous  Given 04/16/20 1232)  LORazepam (ATIVAN) injection 1 mg (1 mg Intravenous Given 04/16/20 1259)  HYDROmorphone (DILAUDID) injection 1 mg (1 mg Intravenous Given 04/16/20 1347)    ED Course  I have reviewed the triage vital signs and the nursing notes.  Pertinent labs & imaging results that were available during my care of the patient were reviewed by me and considered in my medical decision making (see chart for details).    MDM Rules/Calculators/A&P                          BP (!) 156/107   Pulse 82   Temp (!) 97.5 F (36.4 C) (Oral)   Resp (!) 22   SpO2 94%   Final Clinical Impression(s) / ED Diagnoses Final diagnoses:  Kidney stone    Rx / DC Orders ED Discharge Orders         Ordered    oxyCODONE-acetaminophen (PERCOCET) 5-325 MG tablet  Every 6 hours PRN        04/16/20 1517    ondansetron (ZOFRAN) 4 MG tablet  Every 8 hours PRN        04/16/20 1517         12:56 PM Patient with history significant for prior kidney stones here with acute onset of right flank pain and hematuria.  He appears very uncomfortable, suspect this is likely to be secondary to nephrolithiasis.  Low suspicion for dissection.  Will provide symptomatic treatment, will obtain CT scan of the abdomen pelvis for further evaluation.  Patient has received multiple dose of antinausea medication but still endorse nausea and actively vomiting.  Ativan given.  We will continue with pain management.  2:59 PM Pt  appears a bit more comfortable after receiving multiple doses of pain medication.  CT renal stone study demonstrate a 23mm kidney stone at the level of UVJ with hydronephrosis.  3:13 PM At this time, recommend patient to stay hydrated, use a urine strainer to capture the stone and bring her to urology for further evaluation.  Will prescribe opiate pain medication as well as antinausea medication to go home.  Return precaution discussed.  Patient made aware that opiate medication can cause drowsiness.  Please note UA did shows many bacteria however in the setting of kidney stone without dysuria, I do not think patient would need to be treated for UTI at this time.   Fayrene Helper, [name] 04/16/20 1518    Terrilee Files, MD 04/16/20 1911

## 2020-04-16 NOTE — Discharge Instructions (Addendum)
You have been evaluated for your flank pain.  You have been diagnosed with having a 6 mm kidney stone on the right side causing pain.  Anticipate you should be able to pass a stone within the next 1 to 2 days.  Take Percocet pain medication as needed for pain but be aware that it can cause drowsiness.  Use a urine strainer to capture the stone and bring it to urologist for further evaluation.  Follow instruction below.

## 2020-04-16 NOTE — ED Triage Notes (Signed)
Patient arrives to ED via EMS with complaints of x1 day of right flank pain. Pt states odd smelling urine x3-4 days. Pt in intense pain in triage. EMS gave of Fentanyl and 4mg  zofran. Hx kidney stones.

## 2020-04-17 ENCOUNTER — Encounter: Payer: 59 | Attending: Registered Nurse | Admitting: Psychology

## 2020-04-17 ENCOUNTER — Other Ambulatory Visit: Payer: Self-pay

## 2020-04-17 DIAGNOSIS — I639 Cerebral infarction, unspecified: Secondary | ICD-10-CM | POA: Diagnosis present

## 2020-04-17 DIAGNOSIS — I69319 Unspecified symptoms and signs involving cognitive functions following cerebral infarction: Secondary | ICD-10-CM | POA: Insufficient documentation

## 2020-04-17 DIAGNOSIS — F4323 Adjustment disorder with mixed anxiety and depressed mood: Secondary | ICD-10-CM | POA: Diagnosis present

## 2020-04-17 MED ORDER — CITALOPRAM HYDROBROMIDE 10 MG PO TABS: 10.0000 mg | ORAL_TABLET | Freq: Every day | ORAL | 1 refills | Status: DC

## 2020-04-17 NOTE — Addendum Note (Signed)
Addended by: Erick Colace on: 04/17/2020 11:54 AM   Modules accepted: Orders

## 2020-04-18 ENCOUNTER — Encounter: Payer: Self-pay | Admitting: Psychology

## 2020-04-18 NOTE — Progress Notes (Signed)
Neuropsychology Visit  Patient:  [name]   DOB: [birth year]  MR Number: [id number]  Location: [facility] FOR PAIN AND REHABILITATIVE MEDICINE [facility] PHYSICAL MEDICINE AND REHABILITATION [address] [place], [address] [id number] [facility] [place] [address] Dept: [phone]  Date of Service: 04/17/2020  Start: 10 AM End: 11 AM  Today's visit was an in person visit that was conducted in my outpatient clinic office.  The patient, his wife and myself were present.  Duration of Service: 1 Hour  Provider/Observer:     Hershal Coria PsyD  Chief Complaint:      Chief Complaint  Patient presents with  . Depression  . Anxiety  . Pain  . Other    Reason For Service:     [name] is a 49 year old male referred by [name], MD for neuropsychological consultation due to coping and adjustment issues with significant residual motor deficits and coordination deficits of his cerebrovascular accident.  The patient has a prior medical history including hypertension, sleep apnea, hyperlipidemia, depression with anxiety, obesity with BMI 39.68.  Patient presented on 11/16/2019 with dizziness and gait abnormalities.  MRI showed a 1.1 cm acute left PICA territory ischemic infarction within the left cerebral hemisphere also impacting cerebellar regions.  Additional punctuate acute infarcts were noted in the left occipital lobe.  Follow-up CT imaging showed brainstem compression with obstructive hydrocephalus with patient undergoing suboccipital craniotomy on 11/17/2019.  Patient subsequently was treated in the comprehensive inpatient rehabilitation program and is also followed up with outpatient OT/PT therapies with patient reaching his goals in PT.  The patient is continued to have fatigue, diffuse body pains, significant motor deficits including deficits in coordination and gait changes as well.  The patient has not been able to return to work as his job as a Psychologist, occupational was a very  physical job in his motor coordination deficits of left that unattainable at this point.  The patient continues with significant pain, depression and agitation.  The patient has been started on a number of medications with the most recent addition of Cymbalta started 3 weeks ago with some significant improvement particular around agitation and reactive emotional responses.  The patient also takes Klonopin at times for anxiety and distress.  The patient takes Topamax for ongoing headaches.  The patient reports that every day is a struggle for him.  The patient reports that he wakes up in the morning feeling significant body pain and it takes him a while to oriented to his surroundings and get going due to the pain.  He reports that he did have some pain before but this is much worse than everything is amplified post cerebrovascular event.  The patient's wife reports that she has seen progression in his overall function and now is walking much better but is very mechanical in his gait.  The patient continues to have significant balance and coordination issues.  Another major issue is ongoing sleep disturbance.  The patient reports that he has no normality to his sleep patterns.  He has been diagnosed with obstructive sleep apnea and uses a CPAP machine and has become much better as far as his consistency.  Appetite is returned to baseline.  He does acknowledge changes in his memory but the biggest cognitive changes a change in his loss of keeping up with time and relationships of time frame.  There is also report of changes in sense of taste or smell.  Treatment Interventions:  Cognitive/behavioral therapeutic interventions and working  on coping skills and strategies around residual effects of his cerebrovascular accident.  Participation Level:   Active  Participation Quality:  Appropriate      Behavioral Observation:  Well Groomed, Alert, and Appropriate.   Current Psychosocial Factors: The patient  continues with residual symptoms but he has been improving overall function.  Mood is been more stable.  He has had continued difficulties with Cymbalta and I have discussed medication issues with Dr [name] and they will address those issues directly with on the patient's next visit with him.  Content of Session:   Reviewed current symptoms and continue to work on therapeutic interventions around residual effects of his ischemic stroke.  The patient had a significant kidney stone event the day prior to the visit and handled this relatively well given the level of pain.  The patient reports that his blood pressure did not go up significantly and it does appear that his blood pressure another medical issues have been managed very well.  Effectiveness of Interventions: The patient was an active participant in these therapeutic interventions and we worked on foundational issues as well as various coping skills and strategies.  Target Goals:   Improve the patient is coping and adjustment to residual effects of his ischemic stroke impacting cerebellar regions.  Goals Last Reviewed:   04/17/2020  Goals Addressed Today:    Today we worked on foundational issues related to coping and adjustment, sleep hygiene issues and other behavioral interventions.  Impression/Diagnosis:   [name] is a 49 year old male referred by [name], MD for neuropsychological consultation due to coping and adjustment issues with significant residual motor deficits and coordination deficits of his cerebrovascular accident.  The patient has a prior medical history including hypertension, sleep apnea, hyperlipidemia, depression with anxiety, obesity with BMI 39.68.  Patient presented on 11/16/2019 with dizziness and gait abnormalities.  MRI showed a 1.1 cm acute left PICA territory ischemic infarction within the left cerebral hemisphere also impacting cerebellar regions.  Additional punctuate acute infarcts were noted in  the left occipital lobe.  Follow-up CT imaging showed brainstem compression with obstructive hydrocephalus with patient undergoing suboccipital craniotomy on 11/17/2019.  Patient subsequently was treated in the comprehensive inpatient rehabilitation program and is also followed up with outpatient OT/PT therapies with patient reaching his goals in PT.  The patient is continued to have fatigue, diffuse body pains, significant motor deficits including deficits in coordination and gait changes as well.  The patient has not been able to return to work as his job as a Psychologist, occupational was a very physical job in his motor coordination deficits of left that unattainable at this point.  The patient continues with significant pain, depression and agitation.  Diagnosis:   Ischemic stroke (HCC) - L PICA infarct embolic in setting of PFO s/p crani  Cognitive deficit, post-stroke  Adjustment reaction with anxiety and depression    Arley Phenix, Psy.D. Clinical Psychologist Neuropsychologist

## 2020-04-20 ENCOUNTER — Other Ambulatory Visit: Payer: Self-pay | Admitting: Physical Medicine & Rehabilitation

## 2020-04-23 ENCOUNTER — Encounter: Payer: 59 | Admitting: Physical Medicine & Rehabilitation

## 2020-04-25 ENCOUNTER — Ambulatory Visit: Payer: 59 | Admitting: Psychology

## 2020-05-14 ENCOUNTER — Other Ambulatory Visit: Payer: Self-pay | Admitting: Physical Medicine & Rehabilitation

## 2020-05-14 ENCOUNTER — Ambulatory Visit: Payer: 59 | Admitting: Physical Medicine & Rehabilitation

## 2020-06-17 ENCOUNTER — Ambulatory Visit: Payer: 59 | Admitting: Psychology

## 2020-06-17 ENCOUNTER — Telehealth: Payer: Self-pay | Admitting: Adult Health

## 2020-06-17 ENCOUNTER — Telehealth: Payer: Self-pay

## 2020-06-17 DIAGNOSIS — G4733 Obstructive sleep apnea (adult) (pediatric): Secondary | ICD-10-CM

## 2020-06-17 DIAGNOSIS — I639 Cerebral infarction, unspecified: Secondary | ICD-10-CM

## 2020-06-17 NOTE — Telephone Encounter (Signed)
Pt's wife has called the sleep lab today, requesting an appt to be scheduled to see one of our Sleep physician's. It has been over 5 or more years since last sleep study. With recent stroke, pt and wife are concerned of current settings of PAP is working properly. Can you put in a referral to see one of our sleep physician's? Thanks!!

## 2020-06-17 NOTE — Telephone Encounter (Addendum)
Called and LMVM for wife That refills per JM/NP for future refills to get thru pcp, also drug registry show temazepam and carisoprodol last filled 05-14-20 thru Dr. [facility],  Please call back to discuss if needed. Has appt in 07/2020 with JM/NP.

## 2020-06-17 NOTE — Telephone Encounter (Signed)
Pt.'s wife [name] is on [place]. She is requesting refill for husband for clonazePAM (KLONOPIN) 0.5 MG tablet.  Pharmacy: [facility]

## 2020-06-17 NOTE — Addendum Note (Signed)
Addended by: Raliegh Ip on: 06/17/2020 11:49 AM   Modules accepted: Orders

## 2020-06-28 ENCOUNTER — Telehealth: Payer: Self-pay | Admitting: Cardiovascular Disease

## 2020-06-28 ENCOUNTER — Other Ambulatory Visit: Payer: Self-pay | Admitting: Physician Assistant

## 2020-06-28 ENCOUNTER — Telehealth: Payer: Self-pay | Admitting: Physician Assistant

## 2020-06-28 ENCOUNTER — Emergency Department (HOSPITAL_COMMUNITY): Payer: 59

## 2020-06-28 ENCOUNTER — Encounter: Payer: Self-pay | Admitting: Cardiology

## 2020-06-28 ENCOUNTER — Other Ambulatory Visit: Payer: Self-pay

## 2020-06-28 ENCOUNTER — Emergency Department (HOSPITAL_COMMUNITY)
Admission: EM | Admit: 2020-06-28 | Discharge: 2020-06-28 | Disposition: A | Discharge status: Home / Self Care | Payer: 59 | Attending: Emergency Medicine | Admitting: Emergency Medicine

## 2020-06-28 ENCOUNTER — Other Ambulatory Visit (HOSPITAL_COMMUNITY)
Admission: RE | Admit: 2020-06-28 | Discharge: 2020-06-28 | Disposition: A | Discharge status: Home / Self Care | Payer: 59 | Source: Other Acute Inpatient Hospital

## 2020-06-28 ENCOUNTER — Ambulatory Visit: Payer: 59 | Admitting: Cardiology

## 2020-06-28 VITALS — BP 130/92 | HR 84 | Ht 75.0 in | Wt 325.2 lb

## 2020-06-28 DIAGNOSIS — Z5321 Procedure and treatment not carried out due to patient leaving prior to being seen by health care provider: Secondary | ICD-10-CM | POA: Insufficient documentation

## 2020-06-28 DIAGNOSIS — I63531 Cerebral infarction due to unspecified occlusion or stenosis of right posterior cerebral artery: Secondary | ICD-10-CM

## 2020-06-28 DIAGNOSIS — Q2112 Patent foramen ovale: Secondary | ICD-10-CM

## 2020-06-28 DIAGNOSIS — R079 Chest pain, unspecified: Secondary | ICD-10-CM | POA: Insufficient documentation

## 2020-06-28 DIAGNOSIS — Q211 Atrial septal defect: Secondary | ICD-10-CM | POA: Diagnosis not present

## 2020-06-28 DIAGNOSIS — Z8774 Personal history of (corrected) congenital malformations of heart and circulatory system: Secondary | ICD-10-CM

## 2020-06-28 DIAGNOSIS — R072 Precordial pain: Secondary | ICD-10-CM

## 2020-06-28 DIAGNOSIS — R0789 Other chest pain: Secondary | ICD-10-CM

## 2020-06-28 DIAGNOSIS — E785 Hyperlipidemia, unspecified: Secondary | ICD-10-CM

## 2020-06-28 DIAGNOSIS — I10 Essential (primary) hypertension: Secondary | ICD-10-CM

## 2020-06-28 DIAGNOSIS — Z01818 Encounter for other preprocedural examination: Secondary | ICD-10-CM

## 2020-06-28 LAB — BASIC METABOLIC PANEL
Anion gap: 13 (ref 5–15)
BUN: 11 mg/dL (ref 6–20)
CO2: 23 mmol/L (ref 22–32)
Calcium: 9.3 mg/dL (ref 8.9–10.3)
Chloride: 106 mmol/L (ref 98–111)
Creatinine, Ser: 0.93 mg/dL (ref 0.61–1.24)
GFR, Estimated: >60 mL/min (ref >60)
Glucose, Bld: 123 mg/dL — ABNORMAL HIGH (ref 70–99)
Potassium: 3.5 mmol/L (ref 3.5–5.1)
Sodium: 142 mmol/L (ref 135–145)

## 2020-06-28 LAB — CBC
HCT: 41.7 % (ref 39.0–52.0)
Hemoglobin: 14.8 g/dL (ref 13.0–17.0)
MCH: 32 pg (ref 26.0–34.0)
MCHC: 35.5 g/dL (ref 30.0–36.0)
MCV: 90.1 fL (ref 80.0–100.0)
Platelets: 255 10*3/uL (ref 150–400)
RBC: 4.63 MIL/uL (ref 4.22–5.81)
RDW: 12.2 % (ref 11.5–15.5)
WBC: 5.3 10*3/uL (ref 4.0–10.5)
nRBC: 0 % (ref 0.0–0.2)

## 2020-06-28 LAB — D-DIMER, QUANTITATIVE: D-Dimer, Quant: 0.65 ug/mL-FEU — ABNORMAL HIGH (ref 0.00–0.50)

## 2020-06-28 LAB — TROPONIN I (HIGH SENSITIVITY): Troponin I (High Sensitivity): 3 ng/L (ref <18)

## 2020-06-28 IMAGING — DX DG CHEST 2V
2 series · 2 of 2 positions shown · non-contrast
Comparison: Radiograph [DATE]

CLINICAL DATA: Chest pain.

EXAM:
CHEST - 2 VIEW

[chest pa]
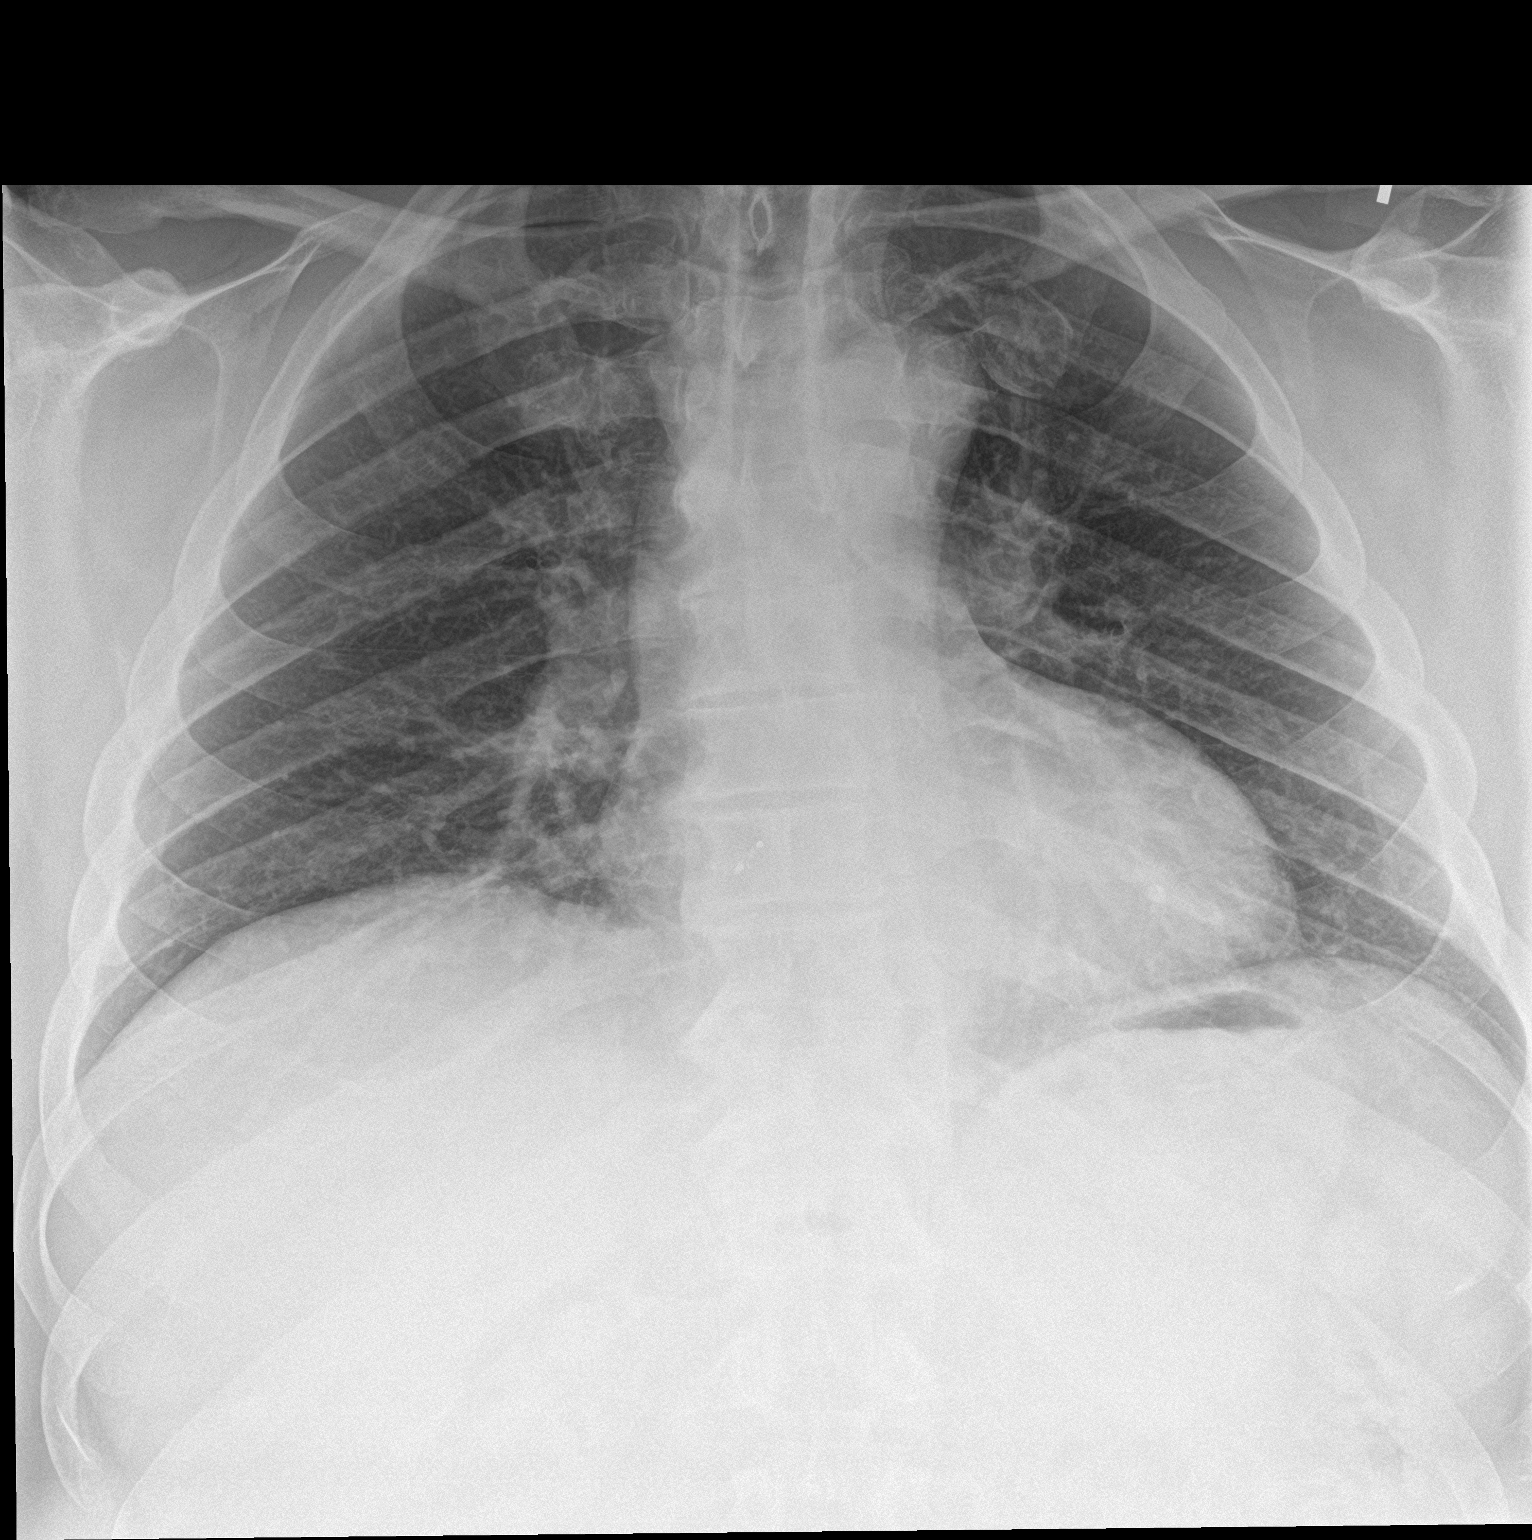

[chest lat]
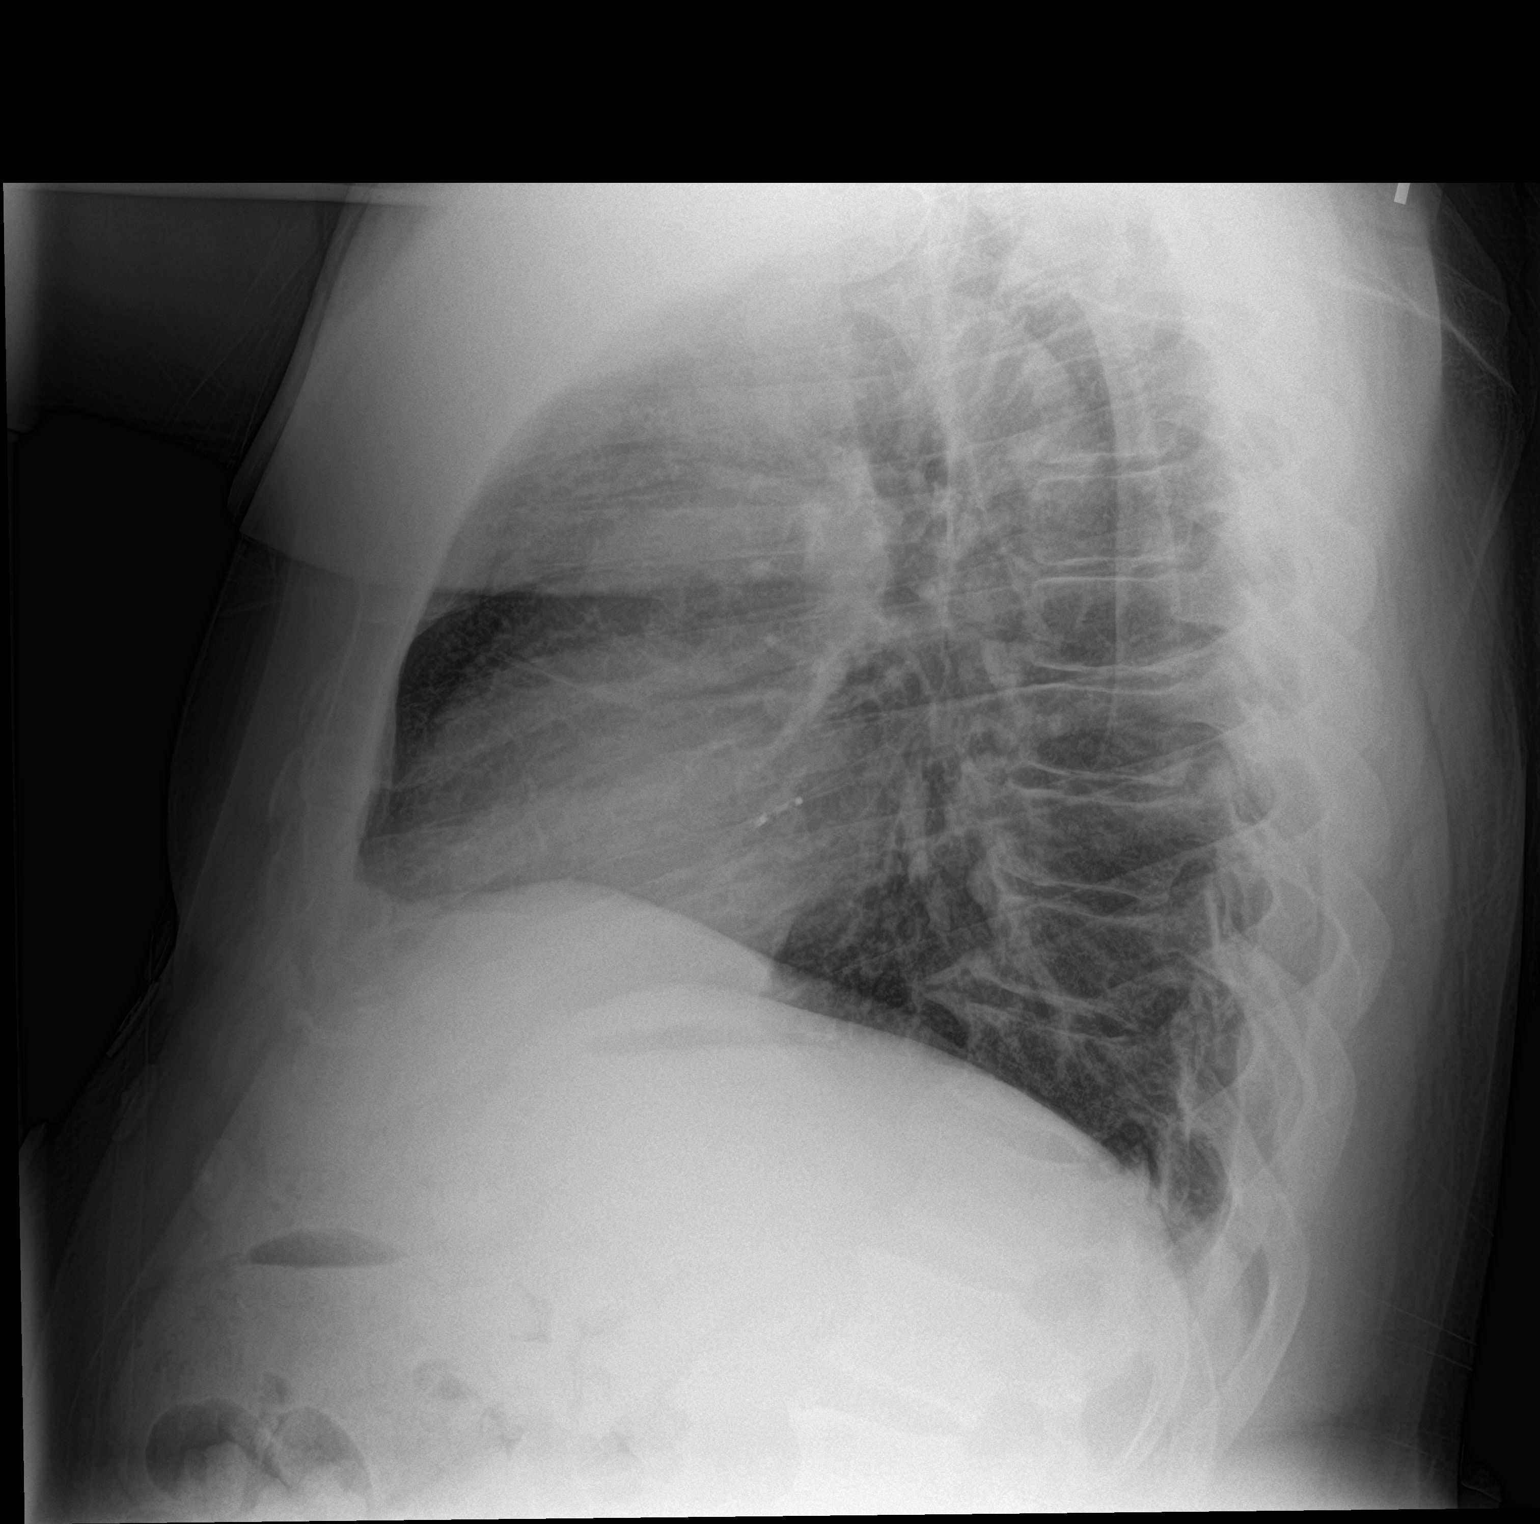

[2 of 2 positions shown; findings below may reference images not displayed]

FINDINGS: The cardiomediastinal contours are normal. Atrial septal closure
device projects in expected locations. The lungs are clear.
Pulmonary vasculature is normal. No consolidation, pleural effusion,
or pneumothorax. No acute osseous abnormalities are seen.
IMPRESSION: No acute chest findings.

## 2020-06-28 MED ORDER — METOPROLOL TARTRATE 100 MG PO TABS: 100.0000 mg | ORAL_TABLET | ORAL | 0 refills | Status: DC

## 2020-06-28 NOTE — Patient Instructions (Signed)
Medication Instructions:  Your physician recommends that you continue on your current medications as directed. Please refer to the Current Medication list given to you today.  *If you need a refill on your cardiac medications before your next appointment, please call your pharmacy*   Lab Work: Today- D-DIMER *STAT  If you have labs (blood work) drawn today and your tests are completely normal, you will receive your results only by: Marland Kitchen MyChart Message (if you have MyChart) OR . A paper copy in the mail If you have any lab test that is abnormal or we need to change your treatment, we will call you to review the results.   Testing/Procedures: Non-Cardiac CT Angiography (CTA), is a special type of CT scan that uses a computer to produce multi-dimensional views of major blood vessels throughout the body. In CT angiography, a contrast material is injected through an IV to help visualize the blood vessels  Your physician has requested that you have an echocardiogram. Echocardiography is a painless test that uses sound waves to create images of your heart. It provides your doctor with information about the size and shape of your heart and how well your heart's chambers and valves are working. This procedure takes approximately one hour. There are no restrictions for this procedure.     Follow-Up: At [facility], you and your health needs are our priority.  As part of our continuing mission to provide you with exceptional heart care, we have created designated Provider Care Teams.  These Care Teams include your primary Cardiologist (physician) and Advanced Practice Providers (APPs -  Physician Assistants and Nurse Practitioners) who all work together to provide you with the care you need, when you need it.  We recommend signing up for the patient portal called "MyChart".  Sign up information is provided on this After Visit Summary.  MyChart is used to connect with patients for Virtual Visits  (Telemedicine).  Patients are able to view lab/test results, encounter notes, upcoming appointments, etc.  Non-urgent messages can be sent to your provider as well.   To learn more about what you can do with MyChart, go to [email or web address].    Your next appointment:   6 week(s)  The format for your next appointment:   In Person  Provider:   You may see Dr. [name] or one of the following Advanced Practice Providers on your designated Care Team:    Tereso Newcomer, [name]  Vin [place], [place]    Other Instructions Your cardiac CT will be scheduled at one of the below locations:   [facility] [address] [place], [place] [address] [phone]  If scheduled at [facility], please arrive at the [facility] main entrance (entrance A) of [facility] 30 minutes prior to test start time. Proceed to the [facility] Radiology Department (first floor) to check-in and test prep.   Please follow these instructions carefully (unless otherwise directed):  Hold all erectile dysfunction medications at least 3 days (72 hrs) prior to test.  On the Night Before the Test: . Be sure to Drink plenty of water. . Do not consume any caffeinated/decaffeinated beverages or chocolate 12 hours prior to your test. . Do not take any antihistamines 12 hours prior to your test.  On the Day of the Test: . Drink plenty of water until 1 hour prior to the test. . Do not eat any food 4 hours prior to the test. . You may take your regular medications  prior to the test.  . Take metoprolol (Lopressor) two hours prior to test.       After the Test: . Drink plenty of water. . After receiving IV contrast, you may experience a mild flushed feeling. This is normal. . On occasion, you may experience a mild rash up to 24 hours after the test. This is not dangerous. If this occurs, you can take Benadryl 25 mg and increase your fluid intake. . If you experience trouble  breathing, this can be serious. If it is severe call 911 IMMEDIATELY. If it is mild, please call our office.  Once we have confirmed authorization from your insurance company, we will call you to set up a date and time for your test. Based on how quickly your insurance processes prior authorizations requests, please allow up to 4 weeks to be contacted for scheduling your Cardiac CT appointment. Be advised that routine Cardiac CT appointments could be scheduled as many as 8 weeks after your provider has ordered it.  For non-scheduling related questions, please contact the cardiac imaging nurse navigator should you have any questions/concerns: Rockwell Alexandria, Cardiac Imaging Nurse Navigator Larey Brick, Cardiac Imaging Nurse Navigator [facility] Direct Office Dial: [phone]   For scheduling needs, including cancellations and rescheduling, please call [place], [phone].

## 2020-06-28 NOTE — Telephone Encounter (Signed)
Pt had PFO closure in Oct 2021.  Not otherwise established in cardiology.    Pt is having intermittent chest pain that started 2 days ago.  It is occurring on/off today, active at work, but nothing strenuous. Left chest.  Not radiating.  Palpation does not make it worse. When takes a deep breath the pain is worse.  Unable to say if it is worse w bending or reaching.   No real SOB.  No swelling.    I have scheduled him with Georgie Chard today.

## 2020-06-28 NOTE — Telephone Encounter (Signed)
Pt c/o of Chest Pain: STAT if CP now or developed within 24 hours  1. Are you having CP right now? Yes.  2. Are you experiencing any other symptoms (ex. SOB, nausea, vomiting, sweating)? No.  3. How long have you been experiencing CP? A week.  4. Is your CP continuous or coming and going? Coming and going.  5. Have you taken Nitroglycerin? No.    Patients wife is calling stating that he's been experiencing chest pains and some tightness in his chest area, they would like to see if he can be worked into Dr. Randolm Idol schedule within the next few days. Please advise. ?

## 2020-06-28 NOTE — Telephone Encounter (Signed)
    Pt w/ hx CVA and PFO s/p amplatzer.  Today, he was seen in the office and was having chest pain, worse with deep inspiration.   Sx concerning for PE, d-dimer performed, results below.  Lab Results  Component Value Date   DDIMER 0.65 (H) 06/28/2020   Pt contacted by phone, wife also present.  Explained that with his sx and elevated d-dimer, CT PE protocol is very important.  Contacted Radiology, he can check in at the ER. They will start an IV, do an Istat and if that is ok (very likely), he will get the scan.  Pt and wife are in agreement with this plan. Orders written.  Theodore Demark, [name] 06/28/2020 7:27 PM

## 2020-06-28 NOTE — ED Notes (Signed)
Registratiion stated he left, LWBS

## 2020-06-28 NOTE — Progress Notes (Signed)
Cardiology Office Note   Date:  06/28/2020   ID:  Henry, [name] 07/10/1970, MRN [id number]  PCP:  Sigmund Hazel, MD  Cardiologist:  Dr. Excell Seltzer, MD (PFO closure)  Chief Complaint  Patient presents with  . Chest Pain   History of Present Illness: [name] is a 50 y.o. male who presents for the evaluation of chest pain.  [name] is a 50yo M with a hx of CVA, PFO s/p percutaneous PFO closure 01/31/20 per Dr. Excell Seltzer, HTN, HLD, anxiety, depression and severe sleep apnea on CPAP. Of note, he has a strong family hx on his mother with MI>death at 67yo.   He was initially evaluated by cardiology after presenting to the ED 11/16/2019 with dizziness and gait abnormality.  Diagnosis was consistent with left PICA territory infarction and a questionable punctate infarct in the left occipital lobe. CTA showed no large vessel arthrosclerosis or stenosis.  Echocardiogram was normal.  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  He ultimately developed obstructive hydrocephalus and underwent suboccipital craniotomy on 11/17/2019.  He was stabilized during his hospitalization and transfer to cardiac inpatient rehabilitation and discharged home.  He was seen by Dr. Excell Seltzer for percutaneous PFO closure 12/20/2019.  Plans were made to continue his medical therapy and work with physical and Occupational Therapy and follow with Dr. Pearlean Brownie with plans for possible PFO closure if desired.  He sought a second opinion at [facility] neurology with a second TCD bubble study that was reported as very abnormal. He then called back for PFO closure per Dr. Excell Seltzer.  This was performed 01/31/2020 with postop limited echocardiogram showing normally functioning Amplatzer occluder device. He was discharged home on ASA and Plavix x 6 months.  He was most recently seen in follow-up 02/28/2020 with no specific complaints.  Today he is here and states that up until several days ago he was in his usual state of health  and was sitting and watching TV. He states that he began having a bandlike squeezing sensation around his mid chest area. He states that the sensation was fairly constant then began to locate to his left anterior chest. He states that the pain is worse with deep inspiration. He cannot tell if he is SOB as he is taking very shallow breaths due to the pain. He denies recent illness with fever, chills or other acute illness. He denies HF symptoms such as LE edema or cough. He has been waiting to see if the symptoms would subside however they have not. He went to work today although has not been able to perform his tasks very well due to his symptoms. There seem to be no real exertional qualities. His wife then called the office for an appointment. I discussed the case with Dr. Shari Prows who is DOD and agrees with attempting to obtain CTA an d-dimer to r/o PE first is essential however with patients insurance, he will need pre-authorization for CTA. Given this we will send for stat d-dimer and I have personally spoken to our on call APP who will follow results. In regards to a more definitve cardiac workup we discussed obtaining cardiac CTA and repeat an echocardiogram.   Past Medical History:  Diagnosis Date  . Arthritis    neck  . Carpal tunnel syndrome on both sides 08/2014  . DDD (degenerative disc disease), lumbar   . History of kidney stones   . Hypertension    under control with med., has been  on med. x 1 yr.  . Sleep apnea    CPAP but does not use  . Stroke [facility]) 11/16/2019    Past Surgical History:  Procedure Laterality Date  . BUBBLE STUDY  11/21/2019   Procedure: BUBBLE STUDY;  Surgeon: [name], MD;  Location: [facility] ENDOSCOPY;  Service: Cardiovascular;;  . CARPAL TUNNEL RELEASE Left 09/07/2014   Procedure: LEFT CARPAL TUNNEL RELEASE;  Surgeon: [name], MD;  Location: [facility];  Service: Orthopedics;  Laterality: Left;  . CARPAL TUNNEL RELEASE Right  10/12/2014   Procedure: RIGHT CARPAL TUNNEL RELEASE;  Surgeon: [name], MD;  Location: [facility];  Service: Orthopedics;  Laterality: Right;  . KNEE ARTHROSCOPY Left   . PATENT FORAMEN OVALE(PFO) CLOSURE N/A 01/31/2020   Procedure: PATENT FORAMEN OVALE (PFO) CLOSURE;  Surgeon: [name], MD;  Location: [facility] INVASIVE CV LAB;  Service: Cardiovascular;  Laterality: N/A;  . SUBOCCIPITAL CRANIECTOMY CERVICAL LAMINECTOMY N/A 11/17/2019   Procedure: SUBOCCIPITAL DECOMPRESSIVE CRANIECTOMY;  Surgeon: [name], MD;  Location: [facility] OR;  Service: Neurosurgery;  Laterality: N/A;  . TEE WITHOUT CARDIOVERSION N/A 11/21/2019   Procedure: TRANSESOPHAGEAL ECHOCARDIOGRAM (TEE);  Surgeon: [name], MD;  Location: [facility] ENDOSCOPY;  Service: Cardiovascular;  Laterality: N/A;     Current Outpatient Medications  Medication Sig Dispense Refill  . aspirin EC 81 MG EC tablet Take 1 tablet (81 mg total) by mouth daily. Swallow whole. 30 tablet 11  . atorvastatin (LIPITOR) 10 MG tablet Take 10 mg by mouth daily.    . clonazePAM (KLONOPIN) 0.5 MG tablet Take 1 tablet (0.5 mg total) by mouth 2 (two) times daily as needed for anxiety (anxiety, sleep, dizziness). 60 tablet 0  . clopidogrel (PLAVIX) 75 MG tablet Take 1 tablet (75 mg total) by mouth daily. 90 tablet 3  . losartan (COZAAR) 50 MG tablet Take 50 mg by mouth daily.    . meclizine (ANTIVERT) 25 MG tablet Take 1 tablet (25 mg total) by mouth 3 (three) times daily as needed for dizziness or nausea. 90 tablet 1  . metoprolol tartrate (LOPRESSOR) 100 MG tablet Take 1 tablet (100 mg total) by mouth as directed. Take 1 tablet by mouth 2 hours before CT 1 tablet 0   No current facility-administered medications for this visit.    Allergies:   Patient has no known allergies.    Social History:  The patient  reports that he has never smoked. He has never used smokeless tobacco. He reports previous alcohol use. He reports that he  does not use drugs.   Family History:  The patient's family history includes Heart attack in his mother.    ROS:  Please see the history of present illness.   Otherwise, review of systems are positive for none.   All other systems are reviewed and negative.    PHYSICAL EXAM: VS:  BP (!) 130/92   Pulse 84   Ht 6\' 3"  (1.905 m)   Wt (!) 325 lb 3.2 oz (147.5 kg)   SpO2 97%   BMI 40.65 kg/m  , BMI Body mass index is 40.65 kg/m.   General: Well developed, well nourished, NAD Neck: Negative for carotid bruits. No JVD Lungs:Clear to ausculation bilaterally. Breathing is unlabored. Cardiovascular: RRR with S1 S2. No murmurs Abdomen: Soft, non-tender, non-distended. No obvious abdominal masses. Extremities: No edema. Radial 2+ bilaterally Neuro: Alert and oriented. No focal deficits. No facial asymmetry. MAE spontaneously. Psych: Responds to questions appropriately with normal affect.  EKG:  EKG is ordered today. The ekg ordered today demonstrates NSR with HR 84 and no acute ST-T wave changes. Similar to prior tracing    Recent Labs: 11/16/2019: Magnesium 1.7; TSH 1.525 12/04/2019: ALT 206 04/16/2020: BUN 10; Creatinine, Ser 1.17; Hemoglobin 15.9; Platelets 237; Potassium 3.8; Sodium 142    Lipid Panel    Component Value Date/Time   CHOL 181 11/16/2019 1623   TRIG 122 11/20/2019 0436   HDL 40 (L) 11/16/2019 1623   CHOLHDL 4.5 11/16/2019 1623   VLDL 7 11/16/2019 1623   LDLCALC 134 (H) 11/16/2019 1623     Wt Readings from Last 3 Encounters:  06/28/20 (!) 325 lb 3.2 oz (147.5 kg)  04/02/20 (!) 315 lb (142.9 kg)  02/28/20 (!) 310 lb (140.6 kg)    Other studies Reviewed: Additional studies/ records that were reviewed today include:  Review of the above records demonstrates:   Echo 01/31/20 IMPRESSIONS 1. Left ventricular ejection fraction, by estimation, is 60 to 65%. The  left ventricle has normal function. The left ventricle has no regional  wall motion abnormalities.   2. Atrial septal occluder device is in place.  3. The mitral valve is normal in structure. No evidence of mitral valve  regurgitation. No evidence of mitral stenosis.  4. The aortic valve is normal in structure. Aortic valve regurgitation is  not visualized.  5. Aortic dilatation noted. There is mild to moderate dilatation of the  ascending aorta, measuring 39 mm.   ASSESSMENT AND PLAN:  1 .Chest pain: -Presented as an add on today for chest pain. Several days ago he was in his usual state of health and was sitting and watching TV>>began having a bandlike squeezing sensation around his mid chest area>>sensation was fairly constant then began to locate to his left anterior chest. He states that the pain is worse with deep inspiration. He cannot tell if he is SOB as he is taking very shallow breaths due to the pain. He denies recent illness with fever, chills or other acute illness. He denies HF symptoms such as LE edema or cough.  -Discussed the case with Dr. Shari Prows who is DOD and agrees with attempting to obtain CTA an d-dimer to r/o PE first is essential however with patients insurance, he will need pre-authorization for CTA.  -Given this we will send for stat d-dimer and I have personally spoken to our on call APP who will follow results. -In regards to a more definitve cardiac workup we discussed obtaining cardiac CTA and repeat an echocardiogram.  -ED precautions reviewed  -Continue ASA, Plavix for CVA and PFO closure   2. PFO with closure: -Plan for repeat echo for further evaluation  -Continue ASA, Plavix   3. CVA: -11/2019 with CVA complicated by hydrocephalous requiring craniotomy  -Discharged to inpatient rehab then home -Workup included echo with bubble study which was positive for PFO>>>closure performed 01/31/20 per Dr. Excell Seltzer -Plan to repeat echocardiogram   4. HTN: -Stable, 130/92 -Continue current regimen   5. HLD: -Last LDL, 134 on 11/16/2019 -On statin therapy  however will need to up titrate this  -Repeat labs soon after workup as above   Current medicines are reviewed at length with the patient today.  The patient does not have concerns regarding medicines.  The following changes have been made:  no change  Labs/ tests ordered today include: D-dimer STAT, CCTA, echo   Orders Placed This Encounter  Procedures  . CT CORONARY MORPH W/CTA COR W/SCORE W/CA W/CM &/  OR WO/CM  . CT CORONARY FRACTIONAL FLOW RESERVE DATA PREP  . CT CORONARY FRACTIONAL FLOW RESERVE FLUID ANALYSIS  . D-Dimer, Quantitative  . EKG 12-Lead  . ECHOCARDIOGRAM COMPLETE    Disposition:   FU with myself or Dr. Excell Seltzer  in 6 weeks  Signed, Georgie Chard, NP  06/28/2020 5:00 PM    [facility] [address] [place], [place], [place]  [address] Phone: [phone]; Fax: [phone]

## 2020-06-28 NOTE — ED Triage Notes (Addendum)
Pt presents to ED POV. Pt c/o L CP. Pt reports pain is intermittently radiating. Pt reports that he was seen at cardiologist. Pt sent here for CT scan d/t elevated d-dimer. No cardiac hx. resp e/u

## 2020-07-01 ENCOUNTER — Telehealth: Payer: Self-pay | Admitting: Physician Assistant

## 2020-07-01 ENCOUNTER — Encounter: Payer: 59 | Attending: Registered Nurse | Admitting: Psychology

## 2020-07-01 DIAGNOSIS — I639 Cerebral infarction, unspecified: Secondary | ICD-10-CM | POA: Insufficient documentation

## 2020-07-01 DIAGNOSIS — I69319 Unspecified symptoms and signs involving cognitive functions following cerebral infarction: Secondary | ICD-10-CM | POA: Insufficient documentation

## 2020-07-01 DIAGNOSIS — F4323 Adjustment disorder with mixed anxiety and depressed mood: Secondary | ICD-10-CM | POA: Insufficient documentation

## 2020-07-01 NOTE — Telephone Encounter (Signed)
Left message for patient to call and discuss scheduling the CTA PE protocol ordered by Theodore Demark, PA

## 2020-07-02 NOTE — Telephone Encounter (Signed)
Left message for patient regarding scheduled CTA chet PE protocol appointment 07/09/20 at 1:00pm at [facility] time is 12:45 pm --1st floor radiology for check in--liquids only 4 hours prior to study.  Requested patient call with questions or concerns and information is also available in My Chart

## 2020-07-04 ENCOUNTER — Telehealth (HOSPITAL_COMMUNITY): Payer: Self-pay | Admitting: Emergency Medicine

## 2020-07-04 NOTE — Telephone Encounter (Signed)
Reaching out to patient to offer assistance regarding upcoming cardiac imaging study; pt verbalizes understanding of appt date/time, parking situation and where to check in, pre-test NPO status and medications ordered, and verified current allergies; name and call back number provided for further questions should they arise Rockwell Alexandria RN Navigator Cardiac Imaging [facility] [phone] office [phone] cell  Spoke with wife who took notes. Denies further questions Huntley Dec

## 2020-07-05 ENCOUNTER — Ambulatory Visit (HOSPITAL_COMMUNITY)
Admission: RE | Admit: 2020-07-05 | Discharge: 2020-07-05 | Disposition: A | Discharge status: Home / Self Care | Payer: 59 | Source: Ambulatory Visit | Attending: Cardiology | Admitting: Cardiology

## 2020-07-05 ENCOUNTER — Other Ambulatory Visit: Payer: Self-pay

## 2020-07-05 DIAGNOSIS — R0789 Other chest pain: Secondary | ICD-10-CM

## 2020-07-05 IMAGING — CT CT HEART MORP W/ CTA COR W/ SCORE W/ CA W/CM &/OR W/O CM
2 of 5 series · 12 of 20 positions shown, 14 images · non-contrast
Comparison: None aside from abdominal CTs from [U7] and chest
x-rays.
COMPARISON: None aside from abdominal CTs from [U7] and chest
x-rays.

Addendum:
EXAM:
OVER-READ INTERPRETATION  CT CHEST

The following report is an over-read performed by radiologist Dr.
over-read does not include interpretation of cardiac or coronary
anatomy or pathology. The coronary calcium score/coronary CTA
interpretation by the cardiologist is attached.
CLINICAL DATA: 49M with hypertension, hyperlipidemia, CVA, PFO s/p
closure, family history of premature CAD, morbid obesity and OSA
with chest pain.
Cardiac/Coronary  CT
TECHNIQUE: The patient was scanned on a Phillips Force scanner.

[Series 6: best diast 62 % · axial · 0.47mm/px · z∈[-179,-81]mm · 6 of 344 slices shown]
[im 50/344  vessel]
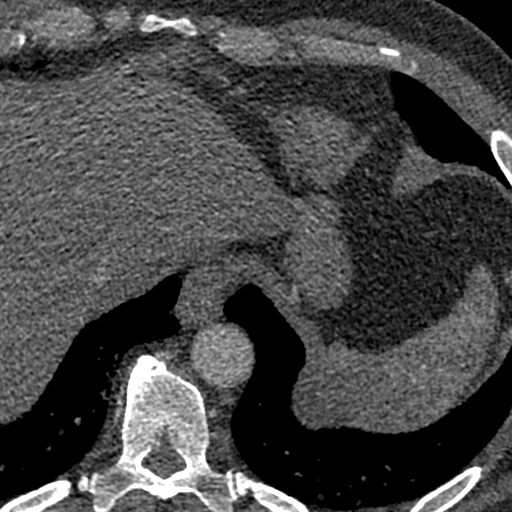
[im 99/344  vessel]
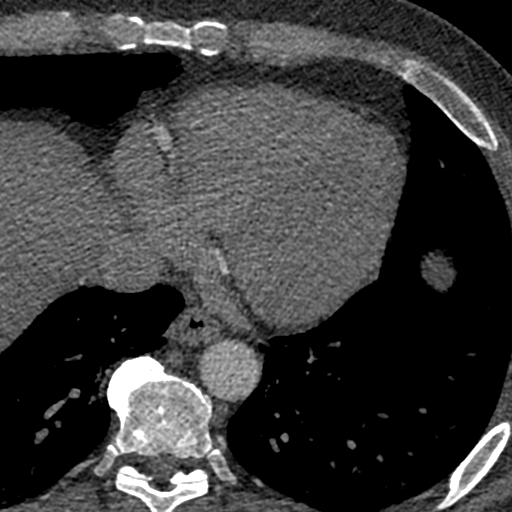
[im 148/344  vessel]
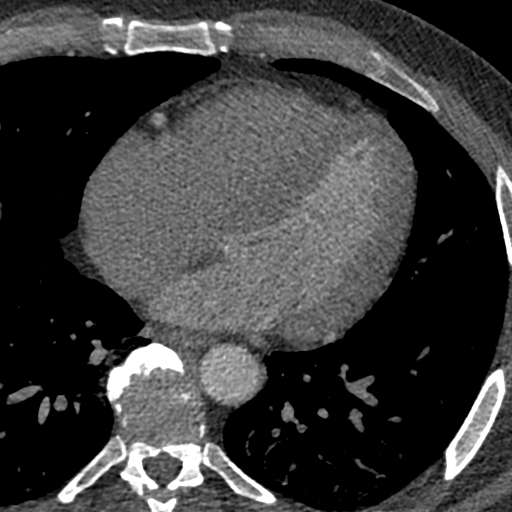
[im 197/344  vessel]
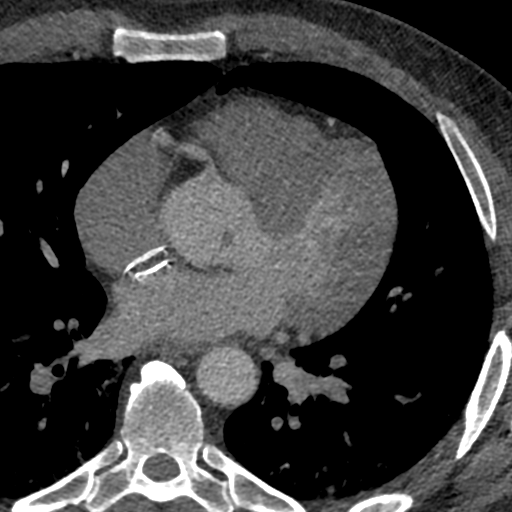
[im 246/344  vessel]
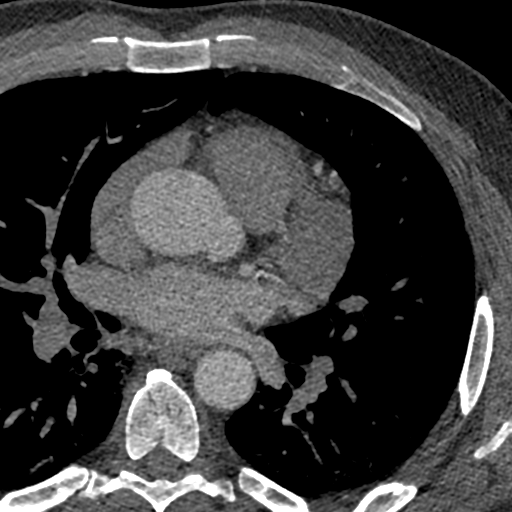
[im 295/344  vessel]
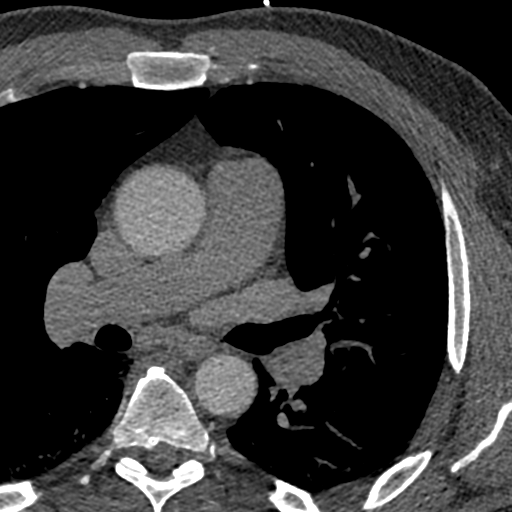

[Series 7: best syst · axial · 0.47mm/px · z∈[-179,-81]mm · 6 of 344 slices shown, 8 images]
[im 50/344  vessel]
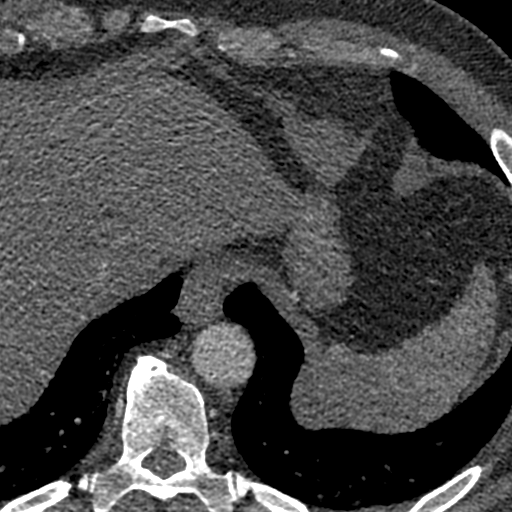
[im 50/344  lung]
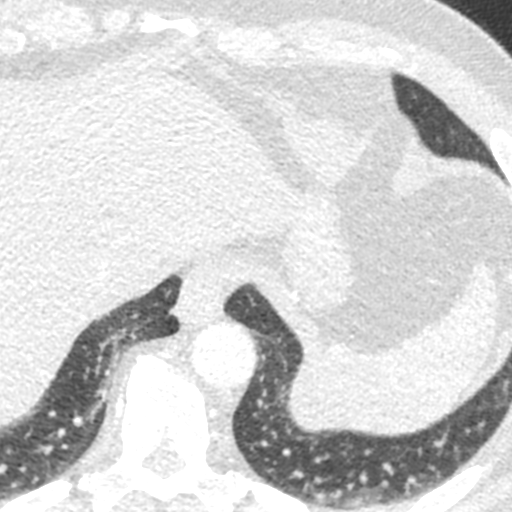
[im 99/344  vessel]
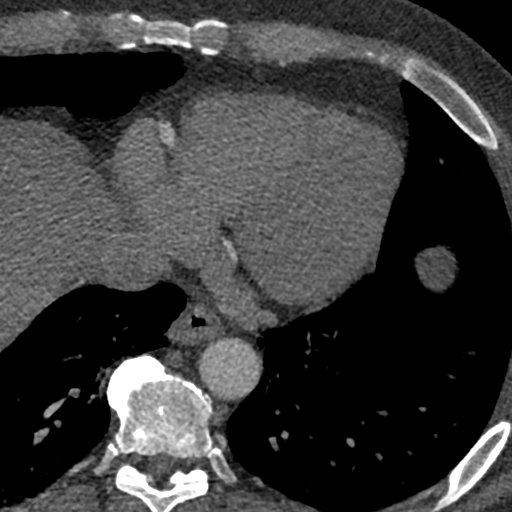
[im 148/344  vessel]
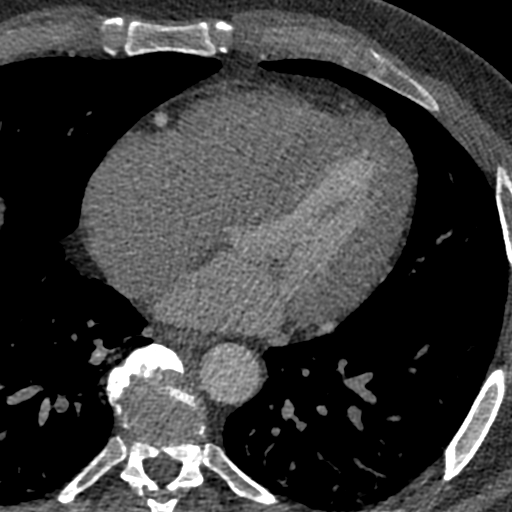
[im 197/344  vessel]
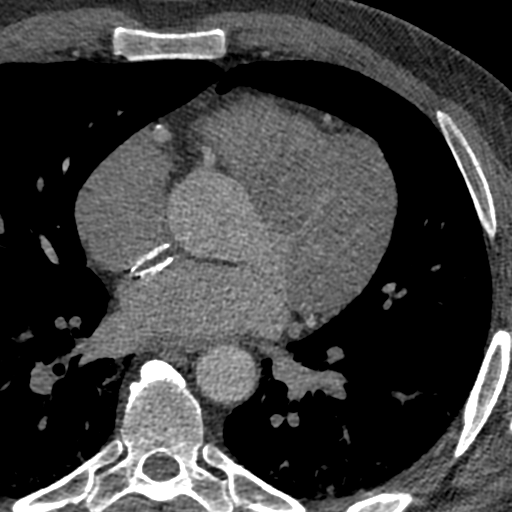
[im 246/344  vessel]
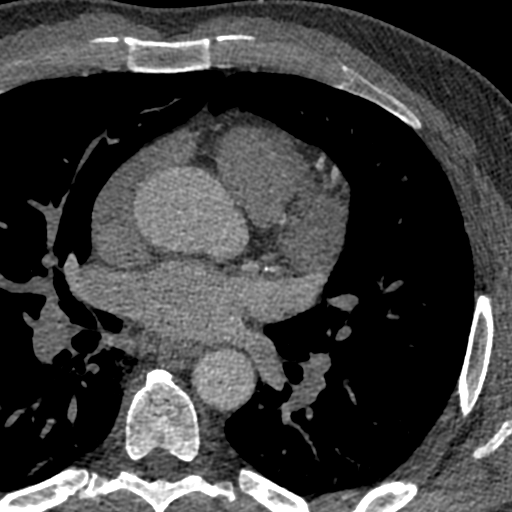
[im 246/344  lung]
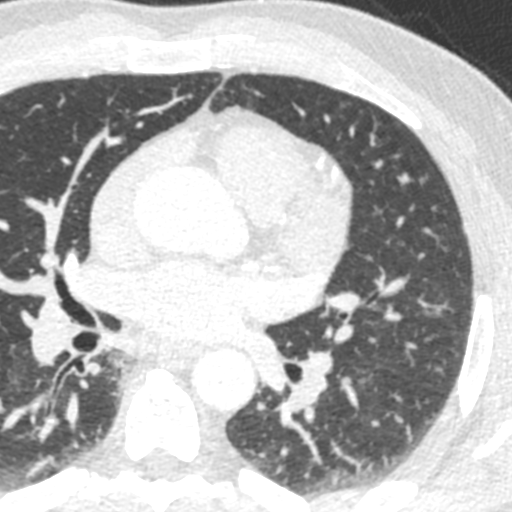
[im 295/344  vessel]
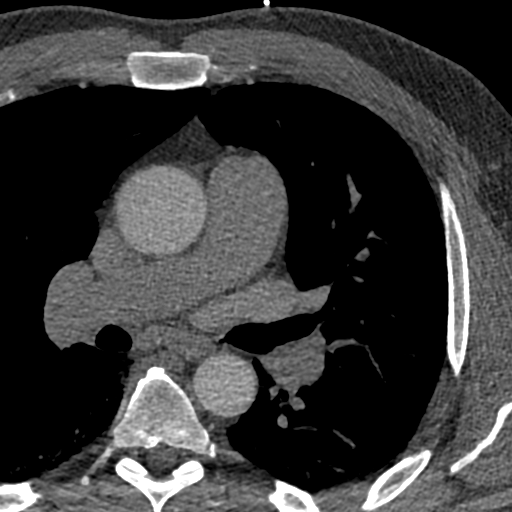

[12 of 20 positions shown; findings below may reference images not displayed]

FINDINGS: Vascular: See dedicated report for coronary CT a and cardiovascular
structures. Note of atrial septal closure device seen on chest
x-ray.

Mediastinum/Nodes: Limited imaging of the mediastinum without signs
of adenopathy. Esophagus grossly normal.

Lungs/Pleura:

Multifocal areas of nodularity particularly in the LEFT upper lobe.
(Image 9 of series 12 with 4 mm ill-defined nodule. Also small
ill-defined nodule on image 18 of series 12 and images 7 and 8 of
series 12. Other areas of ground-glass, subtle in the LEFT upper
lobe. The visualized airways are patent. No sign of pleural
effusion. Small nodule in the RIGHT chest, 3-4 mm, along the minor
fissure on image 11 of series 12. Basilar atelectasis. No
consolidation. No pleural effusion.

Upper Abdomen: Hepatic steatosis. At least moderate. Limited imaging
of upper abdominal contents is otherwise unremarkable.

Musculoskeletal: No acute musculoskeletal process.
IMPRESSION: 1. Multifocal areas of nodularity particularly in the LEFT upper
lobe, with a few ill-defined nodules. Findings are nonspecific but
could be seen in the setting of viral or atypical pneumonia. Given
nodular appearance would suggest follow-up in 3 months to ensure
resolution.
2. Hepatic steatosis.
3. See dedicated report for coronary CT a and cardiovascular
structures.
FINDINGS: A 120 kV prospective scan was triggered in the descending thoracic
aorta at 111 HU's. Axial non-contrast 3 mm slices were carried out
through the heart. The data set was analyzed on a dedicated work
station and scored using the Agatson method. Gantry rotation speed
was 250 msecs and collimation was .6 mm. No beta blockade and 0.8 mg
of sl NTG was given. The 3D data set was reconstructed in 5%
intervals of the 67-82 % of the R-R cycle. Diastolic phases were
analyzed on a dedicated work station using MPR, MIP and VRT modes.
The patient received 80 cc of contrast.

Aorta: Ascending aorta mildly dilated. 4.1 cm. Minimal calcification
of the aortic arch. No dissection.

Aortic Valve:  Trileaflet.  Minimal calcification.

Coronary Arteries:  Normal coronary origin.  Right dominance.

RCA is a large dominant artery that gives rise to PDA and PLVB.
There is trivial (<25%) calcified plaque in the mid and distal RCA.

Left main is a large artery that gives rise to LAD and LCX arteries.

LAD is a large vessel that has minimal (<25%) calcified plaque
proximally.

LCX is a large, non-dominant artery that gives rise to a tiny OM1
branch and a large OM2. There is trivial (<25%) calcified plaque in
the proximal and mid LCX. OM2 has trivial plaque.

Other findings:

Normal pulmonary vein drainage into the left atrium.

Normal let atrial appendage without a thrombus.

Normal size of the pulmonary artery.

There is an Amplatzer PFO occlusion device in the interatrial
septum.
IMPRESSION: 1. Coronary calcium score of 336. This was 98th percentile for age
and sex matched control.

2. Normal coronary origin with right dominance.

3. There is minimal (<25%) calcified plaque in the LAD, LCX, and RCA
arteries. CAD [U7].

4.  Consider non-cardiac causes of chest pain.

5. Recommend aggressive risk factor modification including
high-potency statin.

*** End of Addendum ***
EXAM:
OVER-READ INTERPRETATION  CT CHEST

The following report is an over-read performed by radiologist Dr.
over-read does not include interpretation of cardiac or coronary
anatomy or pathology. The coronary calcium score/coronary CTA
interpretation by the cardiologist is attached.
FINDINGS: Vascular: See dedicated report for coronary CT a and cardiovascular
structures. Note of atrial septal closure device seen on chest
x-ray.

Mediastinum/Nodes: Limited imaging of the mediastinum without signs
of adenopathy. Esophagus grossly normal.

Lungs/Pleura:

Multifocal areas of nodularity particularly in the LEFT upper lobe.
(Image 9 of series 12 with 4 mm ill-defined nodule. Also small
ill-defined nodule on image 18 of series 12 and images 7 and 8 of
series 12. Other areas of ground-glass, subtle in the LEFT upper
lobe. The visualized airways are patent. No sign of pleural
effusion. Small nodule in the RIGHT chest, 3-4 mm, along the minor
fissure on image 11 of series 12. Basilar atelectasis. No
consolidation. No pleural effusion.

Upper Abdomen: Hepatic steatosis. At least moderate. Limited imaging
of upper abdominal contents is otherwise unremarkable.

Musculoskeletal: No acute musculoskeletal process.
IMPRESSION: 1. Multifocal areas of nodularity particularly in the LEFT upper
lobe, with a few ill-defined nodules. Findings are nonspecific but
could be seen in the setting of viral or atypical pneumonia. Given
nodular appearance would suggest follow-up in 3 months to ensure
resolution.
2. Hepatic steatosis.
3. See dedicated report for coronary CT a and cardiovascular
structures.

## 2020-07-05 MED ORDER — IOHEXOL 350 MG/ML SOLN
80.0000 mL | Freq: Once | INTRAVENOUS | Status: AC | PRN
  Administered 2020-07-05: 80 mL via INTRAVENOUS

## 2020-07-05 MED ORDER — NITROGLYCERIN 0.4 MG SL SUBL
SUBLINGUAL_TABLET | SUBLINGUAL | Status: AC
  Filled 2020-07-05: qty 2

## 2020-07-05 MED ORDER — NITROGLYCERIN 0.4 MG SL SUBL
0.8000 mg | SUBLINGUAL_TABLET | Freq: Once | SUBLINGUAL | Status: AC
  Administered 2020-07-05: 0.8 mg via SUBLINGUAL

## 2020-07-09 ENCOUNTER — Ambulatory Visit (HOSPITAL_COMMUNITY): Payer: 59

## 2020-07-10 ENCOUNTER — Telehealth: Payer: Self-pay | Admitting: Physician Assistant

## 2020-07-10 NOTE — Telephone Encounter (Signed)
Spoke with Mrs.Copeman--she states patient has had his CT scan and he received the results yesterday.

## 2020-07-11 ENCOUNTER — Encounter: Payer: Self-pay | Admitting: Neurology

## 2020-07-11 ENCOUNTER — Other Ambulatory Visit: Payer: Self-pay

## 2020-07-11 ENCOUNTER — Ambulatory Visit: Payer: 59 | Admitting: Neurology

## 2020-07-11 VITALS — BP 134/94 | HR 77 | Ht 76.0 in | Wt 327.0 lb

## 2020-07-11 DIAGNOSIS — Z820 Family history of epilepsy and other diseases of the nervous system: Secondary | ICD-10-CM

## 2020-07-11 DIAGNOSIS — I639 Cerebral infarction, unspecified: Secondary | ICD-10-CM

## 2020-07-11 DIAGNOSIS — G4733 Obstructive sleep apnea (adult) (pediatric): Secondary | ICD-10-CM

## 2020-07-11 DIAGNOSIS — E669 Obesity, unspecified: Secondary | ICD-10-CM

## 2020-07-11 DIAGNOSIS — Z9989 Dependence on other enabling machines and devices: Secondary | ICD-10-CM

## 2020-07-11 NOTE — Progress Notes (Signed)
Subjective:    Patient ID: [name] is a 50 y.o. male.  HPI     Huston Foley, MD, PhD [facility] [address] [address] [place], [place] [address]  Dear [name] and [name],   I saw your patient, [name], upon your kind request in my sleep clinic today for initial consultation of his sleep disturbance, in particular, evaluation of his prior diagnosis of obstructive sleep apnea.  The patient is unaccompanied today.  As you know, [name] is a 50 year old right-handed gentleman with an underlying medical history of stroke, degenerative disc disease, kidney stones, hypertension, arthritis, and obesity, who was previously diagnosed with obstructive sleep apnea and placed on CPAP therapy.  Prior sleep study results are not available for my review today, testing was over 5 years ago.  I reviewed your office note from 04/02/2020.  I was able to review his CPAP compliance data from 06/11/2020 to 07/10/2020, which is a total of 30 days, during which time he used his machine 23 out of 30 days with percent use days greater than 4 hours mildly suboptimal at 57%, average usage of 5 hours and 46 minutes for days on treatment, average AHI at goal at 1.3/h, pressure for the 95th percentile at 11.8 cm, he is on an AutoPap machine with a range of 4 to 16 cm with EPR, leak on the higher side but acceptable with a 95th percentile at 16.9 L/min, leak improved after 1 February significantly.  His DME company is [employer].  He is interested in pursuing a new machine.  His set up date was in 2016 according to [employer].  His Epworth sleepiness score is 9 out of 24, fatigue severity score is 39 out of 63. He reports that he uses nasal pillows.  A full facemask was difficult to tolerate in the past.  He does skip some nights because of nasal congestion, he reports a diagnosis of septal deviation, he had ENT consultation and surgery was considered but never came to  fruition.  He lives with his wife, 1 dog in the household, no TV in the bedroom.  He works in a [employer] rights [employer].  He is a non-smoker, drinks alcohol rarely, caffeine in the form of tea and coffee, about 3 servings per day on average.  He goes to bed around 10 and rise time is around 6.  He does not have night to night nocturia or recurrent morning headaches and overall has benefited from sleep apnea treatment.  His father has sleep apnea and a CPAP machine.  He is working on weight loss.  Compared to his original sleep apnea diagnosis several years ago, he believes that he is about 50 pounds less.  His Past Medical History Is Significant For: Past Medical History:  Diagnosis Date  . Arthritis    neck  . Carpal tunnel syndrome on both sides 08/2014  . DDD (degenerative disc disease), lumbar   . History of kidney stones   . Hypertension    under control with med., has been on med. x 1 yr.  . Sleep apnea    CPAP but does not use  . Stroke [facility]) 11/16/2019    His Past Surgical History Is Significant For: Past Surgical History:  Procedure Laterality Date  . BUBBLE STUDY  11/21/2019   Procedure: BUBBLE STUDY;  Surgeon: [name], MD;  Location: [facility] ENDOSCOPY;  Service: Cardiovascular;;  . CARPAL TUNNEL RELEASE Left 09/07/2014   Procedure: LEFT  CARPAL TUNNEL RELEASE;  Surgeon: [name], MD;  Location: [facility];  Service: Orthopedics;  Laterality: Left;  . CARPAL TUNNEL RELEASE Right 10/12/2014   Procedure: RIGHT CARPAL TUNNEL RELEASE;  Surgeon: [name], MD;  Location: [facility];  Service: Orthopedics;  Laterality: Right;  . KNEE ARTHROSCOPY Left   . PATENT FORAMEN OVALE(PFO) CLOSURE N/A 01/31/2020   Procedure: PATENT FORAMEN OVALE (PFO) CLOSURE;  Surgeon: [name], MD;  Location: [facility] INVASIVE CV LAB;  Service: Cardiovascular;  Laterality: N/A;  . SUBOCCIPITAL CRANIECTOMY CERVICAL LAMINECTOMY N/A 11/17/2019   Procedure: SUBOCCIPITAL  DECOMPRESSIVE CRANIECTOMY;  Surgeon: [name], MD;  Location: [facility] OR;  Service: Neurosurgery;  Laterality: N/A;  . TEE WITHOUT CARDIOVERSION N/A 11/21/2019   Procedure: TRANSESOPHAGEAL ECHOCARDIOGRAM (TEE);  Surgeon: [name], MD;  Location: [facility] ENDOSCOPY;  Service: Cardiovascular;  Laterality: N/A;    His Family History Is Significant For: Family History  Problem Relation Age of Onset  . Heart attack Mother     His Social History Is Significant For: Social History   Socioeconomic History  . Marital status: Married    Spouse name: [name]  . Number of children: Not on file  . Years of education: Not on file  . Highest education level: Not on file  Occupational History  . Not on file  Tobacco Use  . Smoking status: Never Smoker  . Smokeless tobacco: Never Used  Vaping Use  . Vaping Use: Former  Substance and Sexual Activity  . Alcohol use: Not Currently    Comment: quit 2006  . Drug use: No  . Sexual activity: Yes  Other Topics Concern  . Not on file  Social History Narrative   Right handed    Lives with wife    Social Determinants of Health   Financial Resource Strain: Not on file  Food Insecurity: Not on file  Transportation Needs: Not on file  Physical Activity: Not on file  Stress: Not on file  Social Connections: Not on file    His Allergies Are:  No Known Allergies:   His Current Medications Are:  Outpatient Encounter Medications as of 07/11/2020  Medication Sig  . aspirin EC 81 MG EC tablet Take 1 tablet (81 mg total) by mouth daily. Swallow whole.  Marland Kitchen atorvastatin (LIPITOR) 10 MG tablet Take 10 mg by mouth daily.  . clonazePAM (KLONOPIN) 0.5 MG tablet Take 1 tablet (0.5 mg total) by mouth 2 (two) times daily as needed for anxiety (anxiety, sleep, dizziness).  . clopidogrel (PLAVIX) 75 MG tablet Take 1 tablet (75 mg total) by mouth daily.  Marland Kitchen losartan (COZAAR) 50 MG tablet Take 50 mg by mouth daily.  . meclizine (ANTIVERT) 25 MG  tablet Take 1 tablet (25 mg total) by mouth 3 (three) times daily as needed for dizziness or nausea.  . [DISCONTINUED] metoprolol tartrate (LOPRESSOR) 100 MG tablet Take 1 tablet (100 mg total) by mouth as directed. Take 1 tablet by mouth 2 hours before CT   No facility-administered encounter medications on file as of 07/11/2020.  :  Review of Systems:  Out of a complete 14 point review of systems, all are reviewed and negative with the exception of these symptoms as listed below: Review of Systems  Neurological:       Here for sleep consult. Prior sleep study - reports machine he is currently using is about 56-44 years old. DME [employer].  Epworth Sleepiness Scale 0= would never doze 1= slight chance of dozing  2= moderate chance of dozing 3= high chance of dozing  Sitting and reading: 1 Watching TV:1 Sitting inactive in a public place (ex. Theater or meeting):2 As a passenger in a car for an hour without a break:0 Lying down to rest in the afternoon:1 Sitting and talking to someone:1 Sitting quietly after lunch (no alcohol):2 In a car, while stopped in traffic:1 Total:9     Objective:  Neurological Exam  Physical Exam Physical Examination:   Vitals:   07/11/20 1315  BP: (!) 134/94  Pulse: 77    General Examination: The patient is a very pleasant 50 y.o. male in no acute distress. He appears well-developed and well-nourished and well groomed.   HEENT: Normocephalic, atraumatic, pupils are equal, round and reactive to light, extraocular tracking is good without limitation to gaze excursion or nystagmus noted. Hearing is grossly intact. Face is symmetric with normal facial animation. Speech is clear with no dysarthria noted. There is no hypophonia. There is no lip, neck/head, jaw or voice tremor. Neck is supple with full range of passive and active motion. There are no carotid bruits on auscultation. Oropharynx exam reveals: mild mouth dryness, adequate dental hygiene and  marked airway crowding, due to large tongue and large uvula.  Mallampati class II, tonsils on the smaller side, neck circumference of 19 5/8 inches.  Tongue protrudes centrally and palate elevates symmetrically.  Chest: Clear to auscultation without wheezing, rhonchi or crackles noted.  Heart: S1+S2+0, regular and normal without murmurs, rubs or gallops noted.   Abdomen: Soft, non-tender and non-distended with normal bowel sounds appreciated on auscultation.  Extremities: There is no obvious edema in the distal lower extremities bilaterally.   Skin: Warm and dry without trophic changes noted.   Musculoskeletal: exam reveals no obvious joint deformities.   Neurologically:  Mental status: The patient is awake, alert and oriented in all 4 spheres. His immediate and remote memory, attention, language skills and fund of knowledge are appropriate. There is no evidence of aphasia, agnosia, apraxia or anomia. Speech is clear with normal prosody and enunciation. Thought process is linear. Mood is normal and affect is normal.  Cranial nerves II - XII are as described above under HEENT exam.  Motor exam: Normal bulk, strength and tone is noted. There is no tremor, Romberg is negative with the exception of minimal sway. Fine motor skills and coordination: grossly intact.  Cerebellar testing: No dysmetria or intention tremor. There is no truncal or gait ataxia.  Sensory exam: intact to light touch in the upper and lower extremities.  Gait, station and balance: He stands easily. No veering to one side is noted. No leaning to one side is noted. Posture is age-appropriate and stance is narrow based. Gait shows normal stride length and normal pace. No problems turning are noted. Tandem walk is challenging for him.                  Assessment and Plan:   In summary, Manuela SchwartzMichael J Bedoya is a very pleasant 50 y.o.-year old male with an underlying medical history of stroke, degenerative disc disease, kidney stones,  hypertension, arthritis, and obesity, who presents for evaluation of his obstructive sleep apnea.  He has been on AutoPap therapy for the past 5+ years.  He should be eligible for a new machine.  He is fairly good with his AutoPap compliance but is encouraged to be fully compliant without skipping any days.  Last 90-day data shows 70 out of 90 days of usage, leak  has improved in the past 3 weeks.  He uses nasal pillows and may have changed to new pillows at the time.  He is benefiting from positive airway pressure treatment and is motivated to continue with it.  He would like to get a new machine.  We mutually agreed a home sleep test for reevaluation.  He is advised that he should not lose his AutoPap machine the night of his sleep testing at home. I explained the importance of treating obstructive sleep apnea.  He believes that he was diagnosed with severe obstructive sleep apnea, recalls that his AHI may have been around 70/h at the time.  He has lost some weight in the interim and is commended for his weight loss endeavor.  He is advised to continue to pursue healthy lifestyle and good sleep hygiene.  We will plan to follow-up after sleep testing.   I answered all his questions today and the patient was in agreement with the plan.  Thank you very much for allowing me to participate in the care of this nice patient. If I can be of any further assistance to you please do not hesitate to talk to me,   Sincerely,   Huston Foley, MD, PhD

## 2020-07-11 NOTE — Patient Instructions (Signed)
  Based on your symptoms and your exam I believe you are still at risk for obstructive sleep apnea and would benefit from reevaluation as it has been many years and you should be eligible for a new machine.   As discussed, we will proceed with a home sleep test for reevaluation of your sleep apnea.   In the meantime, please continue using your current machine and supplies.  Please try to be consistent and do not skip any nights.  Continue to work on weight loss.  Please remember, the risks and ramifications of moderate to severe obstructive sleep apnea or OSA are: Cardiovascular disease, including congestive heart failure, stroke, difficult to control hypertension, arrhythmias, and even type 2 diabetes has been linked to untreated OSA. Sleep apnea causes disruption of sleep and sleep deprivation in most cases, which, in turn, can cause recurrent headaches, problems with memory, mood, concentration, focus, and vigilance. Most people with untreated sleep apnea report excessive daytime sleepiness, which can affect their ability to drive. Please do not drive if you feel sleepy.   I will likely see you back after your sleep study to go over the test results and where to go from there. We will call you after your sleep study to advise about the results (most likely, you will hear from [place], my nurse) and to set up an appointment at the time, as necessary.    Our sleep lab administrative assistant will call you to schedule your sleep study. If you don't hear back from her by about 2 weeks from now, please feel free to call her at [phone]. You can leave a message with your phone number and concerns, if you get the voicemail box. She will call back as soon as possible.

## 2020-07-15 ENCOUNTER — Ambulatory Visit: Payer: 59 | Admitting: Psychology

## 2020-07-17 ENCOUNTER — Telehealth: Payer: Self-pay

## 2020-07-17 NOTE — Telephone Encounter (Signed)
LVM for pt to call me back to schedule sleep study  

## 2020-07-19 ENCOUNTER — Other Ambulatory Visit (HOSPITAL_COMMUNITY): Payer: 59

## 2020-07-20 ENCOUNTER — Other Ambulatory Visit: Payer: Self-pay | Admitting: Physical Medicine & Rehabilitation

## 2020-07-23 NOTE — Telephone Encounter (Signed)
Last refill unless pt seen in clinic

## 2020-07-25 ENCOUNTER — Other Ambulatory Visit: Payer: Self-pay

## 2020-07-25 ENCOUNTER — Encounter (HOSPITAL_COMMUNITY): Payer: Self-pay | Admitting: Radiology

## 2020-07-25 ENCOUNTER — Ambulatory Visit (HOSPITAL_COMMUNITY): Payer: 59 | Attending: Cardiology

## 2020-07-25 DIAGNOSIS — R079 Chest pain, unspecified: Secondary | ICD-10-CM | POA: Diagnosis not present

## 2020-07-25 LAB — ECHOCARDIOGRAM COMPLETE
Area-P 1/2: 3.54 cm2
S' Lateral: 3.1 cm

## 2020-07-29 ENCOUNTER — Telehealth: Payer: Self-pay

## 2020-07-29 NOTE — Telephone Encounter (Signed)
LVM for pt to call me back to schedule sleep study  

## 2020-07-31 ENCOUNTER — Ambulatory Visit: Payer: 59 | Admitting: Adult Health

## 2020-07-31 ENCOUNTER — Encounter: Payer: Self-pay | Admitting: Adult Health

## 2020-07-31 ENCOUNTER — Other Ambulatory Visit: Payer: Self-pay

## 2020-07-31 ENCOUNTER — Telehealth: Payer: Self-pay | Admitting: Physician Assistant

## 2020-07-31 VITALS — BP 128/84 | HR 80 | Ht 76.0 in | Wt 326.0 lb

## 2020-07-31 DIAGNOSIS — Q211 Atrial septal defect: Secondary | ICD-10-CM | POA: Diagnosis not present

## 2020-07-31 DIAGNOSIS — I63442 Cerebral infarction due to embolism of left cerebellar artery: Secondary | ICD-10-CM

## 2020-07-31 DIAGNOSIS — Z9989 Dependence on other enabling machines and devices: Secondary | ICD-10-CM

## 2020-07-31 DIAGNOSIS — Q2112 Patent foramen ovale: Secondary | ICD-10-CM

## 2020-07-31 DIAGNOSIS — G4733 Obstructive sleep apnea (adult) (pediatric): Secondary | ICD-10-CM | POA: Diagnosis not present

## 2020-07-31 DIAGNOSIS — F419 Anxiety disorder, unspecified: Secondary | ICD-10-CM

## 2020-07-31 MED ORDER — CLONAZEPAM 0.5 MG PO TABS: 0.5000 mg | ORAL_TABLET | Freq: Two times a day (BID) | ORAL | 0 refills | Status: DC | PRN

## 2020-07-31 NOTE — Progress Notes (Signed)
[facility] [address] [place]. [place] [address] [phone]       OFFICE FOLLOW-UP NOTE  Mr. CHEVEYO VIRGINIA Date of Birth:  1970/10/03 Medical Record Number:  [id number]   Reason for visit: Stroke follow-up  Chief Complaint  Patient presents with  . Follow-up    RM 14 alone Pt is well, some trouble sleeping but no complaints      HPI:   Today, 07/31/2020, Mr. Nishikawa returns for 29-month stroke follow-up  Reports residual occasional imbalance with some improvement. 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 He has been taking meclizine $RemoveBeforeDE'25mg'NWpDwjXrAXYoOwX$  daily which has been helpful for balance and dizziness  Denies new stroke/TIA symptoms Depression/anxiety relatively stable but will occassionally have increased anxiety and agitation.  He has since discontinued duloxetine without worsening of anxiety.  Use of clonazepam as needed for anxiety and insomnia typically managed by PCP but has not been able to get additional refills due to missing recent appointment due to the passing of his mother-in-law  Compliant on aspirin 81 mg and plavix daily - denies side effects Compliant on atorvastatin -denies side effects Blood pressure today 128/84  Had recent issue with chest pain - extensive work up by cardiology -no cardiac source found and possibly musculoskeletal - has f/u on 3/30  Evaluated by Dr. Rexene Alberts on 07/11/2020 with history of OSA on CPAP and eligible to obtain a new machine.  Needs to schedule home sleep test.  He does report continued compliance with current machine  No further concerns at this time    History provided for reference purposes only Update 04/02/2020 JM Mr. South returns for 31-month stroke follow-up unaccompanied.  He reports residual balance difficulties with occasional dizziness currently working with PT progressing towards goals.  He is also been struggling with anxiety, diffuse body pains and fatigue with some  improvement after starting Cymbalta 30 mg daily.  He has also been following with Dr. Sima Matas neuropsychology which has been beneficial.  Denies residual visual difficulties or headaches.  Previously on topiramate but he has self discontinued without recurrence of headaches.  Daytime fatigue has been slowly improving but has had difficulty sleeping throughout the night as he reports his mind wanders.  Previously on clonazepam which greatly benefited him in regards to insomnia but he has had difficulty obtaining a refill.  Reports compliance with CPAP for OSA management. Underwent successful PFO closure on 01/31/2020 by Dr. Burt Knack.  He has remained on aspirin and Plavix without bleeding or bruising for secondary stroke prevention and post PFO closure for total of 67-month duration.  He also remains on atorvastatin 10 mg daily without myalgias.  Blood pressure today 142/91.  No further concerns at this time.  Initial visit 12/26/2019 Dr. Leonie Man: Mr. Quam is a 50 year old Caucasian male seen today for initial office follow-up visit following hospital admission for stroke in July 2021.  He is accompanied by his wife.  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 He reported that he went to bed 11/15/2019 night normal, woke up next morning   at 530, left the house at  7 AM to take his father to a doctor's appointment when he had a sudden onset of dizziness while he was driving. He describes as a sudden onset of the whole world around him spinning making it difficult to concentrate on the road and drive. He  immediately pulled over, put his car in park, and his cousin's house was right around the corner, who called an ambulance for him. He was very ataxic while walking. He also was complaining of chest pain going on for 2 or 3 days and in fact had been seen by the nurse at his workplace but found to be okay.Marland Kitchen He was also very ataxic sitting up in bed. He was brought into the emergency room and evaluated for his chest pain and also an MRI was obtained for his complaints of dizziness for the concern of a posterior circulation stroke. 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 He was admitted to the hospital service and a neurological consultation was obtained. He was LKW:7 AM on 11/16/2019. tPA not administered as stroke not recognized initially in the ED-examination was normal other than subjective dizziness symptoms as documented in the ED provider note.NIH stroke scale on arrival 0 per chart review. Premorbid modified Rankin scale (mRS):0 .  MRI scan showed left posterior inferior cerebellar infarct without mass-effect and a punctate left occipital infarct.  CT angiogram of the head and neck showed no significant large vessel stenosis or occlusion.  Follow-up CT scan 11/17/2019 showed stable affect however patient had significant neurological decline subsequently requiring emergent neurosurgery consult with suboccipital decompressive craniectomy performed by Dr. Venetia Constable on 11/17/2019.  Follow-up scan showed small scattered subarachnoid hemorrhage in the posterior fossa with subacute cerebellar PICA infarct on the left with partial effacement of fourth ventricle.  2D echo showed normal ejection fraction.  TCD bubble study was positive for right to left shunt at rest but unable to perform Valsalva because of patient's condition.  TEE was subsequently obtained which showed highly mobile in her atrial septum but no clear PFO or ASD flow was noted.  A cardiac CT was recommended for further evaluation of anatomy.  Urine drug  screen was positive for cannabis.  LDL cholesterol 134 mg percent.  Hemoglobin A1c 5.7.  ANA was positive and a titer of 1 is to 80 but ESR was normal at 6 mm.  Hypercoagulable panel labs were all normal.  Patient is seen by physical occupational therapy and felt to be a good patient for inpatient rehab.  He was started on aspirin Plavix for 3 weeks followed by aspirin alone.    Patient states he is to started outpatient therapy now yesterday.  He is able to walk with a wheeled walker.  He still has imbalance.  His headaches seem to be improving but he still taking Topamax 75 mg twice daily which seems to help.  He has stopped taking Fioricet.  Patient has seen Dr. Sherren Mocha on 12/20/2019 for outpatient consultation for endovascular PFO closure but he and his wife need more time and not sure they want to do this at the present time but not willing to revisit it later.  The patient feels that his symptoms were brought on due to exposure to liquid carbon dioxide for prolonged.'s during the few weeks prior to onset of his stroke.  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  There was a leak in the pipe and  he got exposed to that.  He also had other complaints of chest pain and shortness of breath at the time he had the stroke.  He wants me to comment whether his stroke was caused by his workplace exposure to cryogenic liquid or carbon dioxide.    ROS:   14 system review of systems is positive for those listed in HPI.  All other systems negative  PMH:  Past Medical History:  Diagnosis Date  . Arthritis    neck  . Carpal tunnel syndrome on both sides 08/2014  . DDD (degenerative disc disease), lumbar   . History of kidney stones   . Hypertension    under control with med., has been on med. x 1 yr.  . Sleep apnea    CPAP but does not use  . Stroke [facility]) 11/16/2019    Social History:  Social History    Socioeconomic History  . Marital status: Married    Spouse name: [name]  . Number of children: Not on file  . Years of education: Not on file  . Highest education level: Not on file  Occupational History  . Not on file  Tobacco Use  . Smoking status: Never Smoker  . Smokeless tobacco: Never Used  Vaping Use  . Vaping Use: Former  Substance and Sexual Activity  . Alcohol use: Not Currently    Comment: quit 2006  . Drug use: No  . Sexual activity: Yes  Other Topics Concern  . Not on file  Social History Narrative   Right handed    Lives with wife    Social Determinants of Health   Financial Resource Strain: Not on file  Food Insecurity: Not on file  Transportation Needs: Not on file  Physical Activity: Not on file  Stress: Not on file  Social Connections: Not on file  Intimate Partner Violence: Not on file    Medications:   Current Outpatient Medications on File Prior to Visit  Medication Sig Dispense Refill  . aspirin EC 81 MG EC tablet Take 1 tablet (81 mg total) by mouth daily. Swallow whole. 30 tablet 11  . atorvastatin (LIPITOR) 10 MG tablet Take 10 mg by mouth daily.    Marland Kitchen losartan (COZAAR) 50 MG tablet Take 50 mg by mouth daily.    . meclizine (ANTIVERT) 25 MG tablet TAKE 1 TABLET(25 MG) BY MOUTH THREE TIMES DAILY AS NEEDED FOR DIZZINESS OR NAUSEA 90 tablet 1   No current facility-administered medications on file prior to visit.    Allergies:  No Known Allergies  Physical Exam Today's Vitals   07/31/20 0735  BP: 128/84  Pulse: 80  Weight: (!) 326 lb (147.9 kg)  Height: $Remove'6\' 4"'RjwduRE$  (1.93 m)   Body mass index is 39.68 kg/m.  General: Obese pleasant middle-age Caucasian male seated, in no evident distress Head: head normocephalic and atraumatic.  Neck: supple with no carotid or supraclavicular bruits Cardiovascular: regular rate and rhythm, no murmurs Musculoskeletal: no deformity Skin:  no rash/petichiae Vascular:  Normal pulses all  extremities  Neurologic Exam Mental Status: Awake and fully alert. Fluent speech and language.  Oriented to place and time. Recent and remote memory intact. Attention span, concentration and fund of knowledge appropriate. Mood and affect appropriate.  Cranial Nerves: Pupils equal, briskly reactive to light. Extraocular movements full without nystagmus. Visual fields full to confrontation. Hearing intact. Facial sensation intact. Face, tongue, palate moves normally and symmetrically.  Motor: Normal bulk and tone. Normal strength in all tested extremity  muscles except chronic right foot drop. Sensory.: intact to touch ,pinprick .position and vibratory sensation.  Coordination: Rapid alternating movements normal in all extremities.  Finger-to-nose and knee to heel performed accurately bilateral upper and lower extremities Gait and Station: Arises from chair with  difficulty. Stance is normal.  Gait demonstrates normal stride length with mild imbalance initially without assistive device.  Mild to moderate difficulty performing tandem walk.   Reflexes: 1+ and symmetric. Toes downgoing.       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  Vascular risk factors of PFO s/p closure, OSA on CPAP, obesity, hyperlipidemia, and hypertension.      1. L PICA stroke:  a. Residual deficit: Mild intermittent imbalance -return back to work without great difficulty.  Use of meclizine 25 mg 3 times daily as needed prescribed by Dr. Letta Pate b. Anxiety, worsened post stroke: Use of clonazepam 0.5 mg twice daily as needed - 1 mo refill provided until he is able to follow-up with PCP for additional refills. PMP checked with prior refill 2/11 15 day supply by PCP c. Continue aspirin 81 mg daily  and atorvastatin 10 mg daily for secondary stroke prevention.  d. Discussed secondary stroke  prevention measures and importance of close PCP f/u for aggressive stroke risk factor management  e. HTN: BP goal <130/90.  Well-controlled on losartan per PCP f. HLD: LDL goal <70. On atorvastatin 10 mg daily per PCP - reports lipid panel routinely monitored by PCP which has been stable (unable to personally view via epic) 2. PFO:  a. s/p successful closure 01/31/2020 by Dr. Burt Knack.  Currently 6 months post procedure -advised to discuss with cardiology ongoing use of Plavix -scheduled follow-up 3/30 3. OSA on CPAP:  a. Followed by Dr. Rexene Alberts -needs to schedule HST    Follow-up as needed from a stroke standpoint   CC:  [place] provider: Dr. Lyn Records, Lattie Haw, MD      I spent 30 minutes of face-to-face and non-face-to-face time with patient.  This included previsit chart review, lab review, study review, order entry, electronic health record documentation, patient education and discussion regarding history of prior stroke, residual deficits, continued anxiety, importance of managing stroke risk factors and answered all other questions to patient satisfaction  Frann Rider, [facility]  [facility] [address] [place], [place] [address]  Phone [phone] Fax [phone] Note: This document was prepared with digital dictation and possible smart phrase technology. Any transcriptional errors that result from this process are unintentional.

## 2020-07-31 NOTE — Patient Instructions (Signed)
Please ensure you schedule home sleep test as recommended by Dr. Frances Furbish for reevaluation and to be able to obtain a new machine  Refill for klonopin provided today - additional refills should be able to be obtained by Dr. Hyacinth Meeker  Continue aspirin 81 mg daily  and atorvastatin for secondary stroke prevention  Continue to follow up with PCP regarding cholesterol and blood pressure management  Maintain strict control of hypertension with blood pressure goal below 130/90 and cholesterol with LDL cholesterol (bad cholesterol) goal below 70 mg/dL.         Thank you for coming to see [place] at [facility]. I hope we have been able to provide you high quality care today.  You may receive a patient satisfaction survey over the next few weeks. We would appreciate your feedback and comments so that we may continue to improve ourselves and the health of our patients.

## 2020-07-31 NOTE — Telephone Encounter (Signed)
Left message for patient to call and discuss rescheduling the CTA chest PE ordered by Theodore Demark, PA

## 2020-08-04 NOTE — Progress Notes (Signed)
I agree with the above plan 

## 2020-08-14 ENCOUNTER — Ambulatory Visit: Payer: 59 | Admitting: Physician Assistant

## 2020-08-14 ENCOUNTER — Encounter: Payer: Self-pay | Admitting: Physician Assistant

## 2020-08-14 ENCOUNTER — Other Ambulatory Visit: Payer: Self-pay

## 2020-08-14 VITALS — BP 124/80 | HR 84 | Ht 76.0 in | Wt 331.8 lb

## 2020-08-14 DIAGNOSIS — E785 Hyperlipidemia, unspecified: Secondary | ICD-10-CM

## 2020-08-14 DIAGNOSIS — Q211 Atrial septal defect: Secondary | ICD-10-CM | POA: Diagnosis not present

## 2020-08-14 DIAGNOSIS — R918 Other nonspecific abnormal finding of lung field: Secondary | ICD-10-CM | POA: Diagnosis not present

## 2020-08-14 DIAGNOSIS — Z8774 Personal history of (corrected) congenital malformations of heart and circulatory system: Secondary | ICD-10-CM

## 2020-08-14 DIAGNOSIS — I2510 Atherosclerotic heart disease of native coronary artery without angina pectoris: Secondary | ICD-10-CM | POA: Diagnosis not present

## 2020-08-14 DIAGNOSIS — I10 Essential (primary) hypertension: Secondary | ICD-10-CM

## 2020-08-14 DIAGNOSIS — Q2112 Patent foramen ovale: Secondary | ICD-10-CM

## 2020-08-14 DIAGNOSIS — I77810 Thoracic aortic ectasia: Secondary | ICD-10-CM

## 2020-08-14 NOTE — Progress Notes (Addendum)
Cardiology Office Note:    Date:  08/14/2020   ID:  [name] 02/02/1971, MRN [id number]  PCP:  Henry Hazel, MD   [facility]  Cardiologist:  Henry Bollman, MD     Electrophysiologist:  None       Referring MD: Henry Hazel, MD   Chief Complaint:  Follow-up (Chest pain)    Patient Profile:    [name] is a 50 y.o. 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 >>  Needs follow-up CT in 3 months Hepatic steatosis  Echocardiogram 07/25/2020 Ascending aortic aneurysm 44 mm EF 55-60, no RWMA, GR 1 DD, GLS -18.6, normal RVSF, AV sclerosis without stenosis, aortic root 41 mm; no atrial level shunt detected      History of Present Illness:    [name] was last seen in clinic by Henry Chard, NP on 06/28/20.  The patient had symptoms of chest pain.  He was sent for D-dimer which is minimally elevated.  Notes indicate he was to have a chest CT to rule out pulmonary embolism.  He was also set up for coronary CTA.  The coronary CTA was completed 07/05/2020.  This demonstrated minimal nonobstructive plaque.  Calcium score is 336 which places him in the 90th percentile.  Echocardiogram was obtained and demonstrated normal EF and no atrial level shunt.  Of note, his coronary CTA did demonstrate multifocal areas of nodularity with recommendation to follow-up with chest CT in 3 months.  He also  had his ascending aorta measured at 41 mm.  He returns for follow-up.  He is here alone.  He has not had any further chest symptoms.  He has not had significant shortness of breath other than nasal congestion related to allergies.  He thinks that he was probably having a panic attack when he presented to the office.  He has not had syncope, orthopnea, leg edema.         Past Medical History:  Diagnosis Date  . Arthritis    neck  . Carpal tunnel syndrome on both sides 08/2014  . DDD (degenerative disc disease), lumbar   . History of kidney stones   . Hypertension    under control with med., has been on med. x 1 yr.  . Sleep apnea    CPAP but does not use  . Stroke (HCC) 11/16/2019    Current Medications: Current Meds  Medication Sig  . aspirin EC 81 MG EC tablet Take 1 tablet (81 mg total) by mouth daily. Swallow whole.  Marland Kitchen atorvastatin (LIPITOR) 10 MG tablet Take 10 mg by mouth daily.  . clonazePAM (KLONOPIN) 0.5 MG tablet Take 1 tablet (0.5 mg total) by mouth 2 (two) times daily as needed for anxiety (anxiety, sleep, dizziness).  Tery Sanfilippo Calcium (STOOL SOFTENER PO) Take 1 tablet by mouth every other day. OTC  . losartan (COZAAR) 50 MG tablet Take 50 mg by mouth daily.  Marland Kitchen  meclizine (ANTIVERT) 25 MG tablet TAKE 1 TABLET(25 MG) BY MOUTH THREE TIMES DAILY AS NEEDED FOR DIZZINESS OR NAUSEA  . [DISCONTINUED] Clopidogrel Bisulfate (PLAVIX PO) Take 1 tablet by mouth daily.     Allergies:   Patient has no known allergies.   Social History   Tobacco Use  . Smoking status: Never Smoker  . Smokeless tobacco: Never Used  Vaping Use  . Vaping Use: Former  Substance Use Topics  . Alcohol use: Not Currently    Comment: quit 2006  . Drug use: No     Family Hx: The patient's family history includes Heart attack in his mother.  ROS   EKGs/Labs/Other Test Reviewed:    EKG:  EKG is   ordered today.  The ekg ordered today demonstrates normal sinus rhythm, heart rate 84, leftward axis,  poor R wave progression, no ST-T wave changes, QTC 418  Recent Labs: 11/16/2019: Magnesium 1.7; TSH 1.525 12/04/2019: ALT 206 06/28/2020: BUN 11; Creatinine, Ser 0.93; Hemoglobin 14.8; Platelets 255; Potassium 3.5; Sodium 142   Recent Lipid Panel Lab Results  Component Value Date/Time   CHOL 181 11/16/2019 04:23 PM   TRIG 122 11/20/2019 04:36 AM   HDL 40 (L) 11/16/2019 04:23 PM   CHOLHDL 4.5 11/16/2019 04:23 PM   LDLCALC 134 (H) 11/16/2019 04:23 PM      Risk Assessment/Calculations:      Physical Exam:    VS:  BP 124/80   Pulse 84   Ht 6\' 4"  (1.93 m)   Wt (!) 331 lb 12.8 oz (150.5 kg)   SpO2 96%   BMI 40.39 kg/m     Wt Readings from Last 3 Encounters:  08/14/20 (!) 331 lb 12.8 oz (150.5 kg)  07/31/20 (!) 326 lb (147.9 kg)  07/11/20 (!) 327 lb (148.3 kg)     Constitutional:      Appearance: Healthy appearance. Not in distress.  Pulmonary:     Effort: Pulmonary effort is normal.     Breath sounds: No wheezing. No rales.  Cardiovascular:     Normal rate. Regular rhythm. Normal S1. Normal S2.     Murmurs: There is no murmur.  Edema:    Peripheral edema absent.  Abdominal:     Palpations: Abdomen is soft.  Musculoskeletal:     Cervical back: Neck supple. Skin:    General: Skin is warm and dry.  Neurological:     General: No focal deficit present.     Mental Status: Alert and oriented to person, place and time.     Cranial Nerves: Cranial nerves are intact.          ASSESSMENT & PLAN:    1. Coronary artery disease involving native coronary artery of native heart without angina pectoris He has minimal plaque on coronary CTA.  Calcium score is 336 which places him in the 90th percentile.  His chest discomfort was noncardiac.  He has not had any recurrence.  We discussed the importance of risk factor modification.  Continue aspirin, atorvastatin.  2. PFO with atrial septal aneurysm 3. S/P percutaneous patent foramen ovale closure I have reviewed notes from his  chart.  He is able to stop Plavix now.  Recent echocardiogram with out evidence of interatrial shunt.  4. Lung nodules Recent CT with multifocal areas of nodularity.  He is a non-smoker.  I will arrange a follow-up chest CT without contrast in 3 months.  If he continues to have an abnormal CT scan, I will refer him to  pulmonology.  5. Ascending aorta dilation (HCC) 4.1 cm on recent coronary CTA.  Arrange follow-up chest MRI in 1 year.  6. Essential hypertension The patient's blood pressure is controlled on his current regimen.  Continue current therapy.   7. Hyperlipidemia LDL goal <70 He has difficulty tolerating statins.  He has not had his LDL checked since he was started on atorvastatin.  Obtain follow-up lipids and LFTs.  If his LDL remains above 70, add ezetimibe.      Dispo:  Return in about 1 year (around 08/14/2021) for Routine Follow Up, w/ Dr. Excell Seltzer, in person.   Medication Adjustments/Labs and Tests Ordered: Current medicines are reviewed at length with the patient today.  Concerns regarding medicines are outlined above.  Tests Ordered: Orders Placed This Encounter  Procedures  . CT Chest Wo Contrast  . MR Angiogram Chest W Wo Contrast  . Lipid panel  . Hepatic function panel  . EKG 12-Lead   Medication Changes: No orders of the defined types were placed in this encounter.   Signed, Tereso Newcomer, [name]  08/14/2020 5:13 PM    [facility] [address] [place], [place], [place]  [address] Phone: [phone]; Fax: [phone]

## 2020-08-14 NOTE — Patient Instructions (Signed)
Medication Instructions:  Your physician has recommended you make the following change in your medication:  1.  STOP Plavix   *If you need a refill on your cardiac medications before your next appointment, please call your pharmacy*   Lab Work: 2-3 WEEKS: FASTING LIPID & LFT  If you have labs (blood work) drawn today and your tests are completely normal, you will receive your results only by: Marland Kitchen MyChart Message (if you have MyChart) OR . A paper copy in the mail If you have any lab test that is abnormal or we need to change your treatment, we will call you to review the results.   Testing/Procedures: Your physician recommends you have a CT Chest Without Contrast in 3 months.  Non-Cardiac CT scanning, (CAT scanning), is a noninvasive, special x-ray that produces cross-sectional images of the body using x-rays and a computer. CT scans help physicians diagnose and treat medical conditions. For some CT exams, a contrast material is used to enhance visibility in the area of the body being studied. CT scans provide greater clarity and reveal more details than regular x-ray exams.   Your physician has requested that you have a cardiac MRA in 1 year.    Follow-Up: At [facility], you and your health needs are our priority.  As part of our continuing mission to provide you with exceptional heart care, we have created designated Provider Care Teams.  These Care Teams include your primary Cardiologist (physician) and Advanced Practice Providers (APPs -  Physician Assistants and Nurse Practitioners) who all work together to provide you with the care you need, when you need it.  We recommend signing up for the patient portal called "MyChart".  Sign up information is provided on this After Visit Summary.  MyChart is used to connect with patients for Virtual Visits (Telemedicine).  Patients are able to view lab/test results, encounter notes, upcoming appointments, etc.  Non-urgent messages can be sent  to your provider as well.   To learn more about what you can do with MyChart, go to [email or web address].    Your next appointment:   12 month(s)  The format for your next appointment:   In Person  Provider:   [name], MD   Other Instructions

## 2020-08-26 ENCOUNTER — Ambulatory Visit (INDEPENDENT_AMBULATORY_CARE_PROVIDER_SITE_OTHER): Payer: 59 | Admitting: Neurology

## 2020-08-26 DIAGNOSIS — E669 Obesity, unspecified: Secondary | ICD-10-CM

## 2020-08-26 DIAGNOSIS — Z820 Family history of epilepsy and other diseases of the nervous system: Secondary | ICD-10-CM

## 2020-08-26 DIAGNOSIS — G4733 Obstructive sleep apnea (adult) (pediatric): Secondary | ICD-10-CM | POA: Diagnosis not present

## 2020-08-26 DIAGNOSIS — Z9989 Dependence on other enabling machines and devices: Secondary | ICD-10-CM

## 2020-08-26 DIAGNOSIS — I639 Cerebral infarction, unspecified: Secondary | ICD-10-CM

## 2020-08-30 ENCOUNTER — Other Ambulatory Visit: Payer: Self-pay

## 2020-08-30 ENCOUNTER — Other Ambulatory Visit: Payer: 59 | Admitting: *Deleted

## 2020-08-30 DIAGNOSIS — I10 Essential (primary) hypertension: Secondary | ICD-10-CM

## 2020-08-30 DIAGNOSIS — R918 Other nonspecific abnormal finding of lung field: Secondary | ICD-10-CM

## 2020-08-30 DIAGNOSIS — Z8774 Personal history of (corrected) congenital malformations of heart and circulatory system: Secondary | ICD-10-CM

## 2020-08-30 DIAGNOSIS — I253 Aneurysm of heart: Secondary | ICD-10-CM

## 2020-08-30 DIAGNOSIS — E785 Hyperlipidemia, unspecified: Secondary | ICD-10-CM

## 2020-08-30 DIAGNOSIS — I77810 Thoracic aortic ectasia: Secondary | ICD-10-CM

## 2020-08-30 DIAGNOSIS — I2510 Atherosclerotic heart disease of native coronary artery without angina pectoris: Secondary | ICD-10-CM

## 2020-08-30 DIAGNOSIS — Q211 Atrial septal defect: Secondary | ICD-10-CM

## 2020-08-30 LAB — HEPATIC FUNCTION PANEL
ALT: 57 IU/L — ABNORMAL HIGH (ref 0–44)
AST: 34 IU/L (ref 0–40)
Albumin: 4.6 g/dL (ref 4.0–5.0)
Alkaline Phosphatase: 103 IU/L (ref 44–121)
Bilirubin Total: 0.6 mg/dL (ref 0.0–1.2)
Bilirubin, Direct: 0.17 mg/dL (ref 0.00–0.40)
Total Protein: 7.7 g/dL (ref 6.0–8.5)

## 2020-08-30 LAB — LIPID PANEL
Chol/HDL Ratio: 5.1 ratio — ABNORMAL HIGH (ref 0.0–5.0)
Cholesterol, Total: 190 mg/dL (ref 100–199)
HDL: 37 mg/dL — ABNORMAL LOW (ref >39)
LDL Chol Calc (NIH): 141 mg/dL — ABNORMAL HIGH (ref 0–99)
Triglycerides: 62 mg/dL (ref 0–149)
VLDL Cholesterol Cal: 12 mg/dL (ref 5–40)

## 2020-09-02 ENCOUNTER — Other Ambulatory Visit: Payer: Self-pay

## 2020-09-02 DIAGNOSIS — E785 Hyperlipidemia, unspecified: Secondary | ICD-10-CM

## 2020-09-02 MED ORDER — EZETIMIBE 10 MG PO TABS: 10.0000 mg | ORAL_TABLET | Freq: Every day | ORAL | 3 refills | Status: DC

## 2020-09-03 NOTE — Procedures (Signed)
   [facility]  HOME SLEEP TEST (Watch PAT)  STUDY DATE: 08-26-20  DOB: [birth year]  MRN: [id number]  ORDERING CLINICIAN: Huston Foley, MD, PhD   REFERRING CLINICIAN: Dr. Pearlean Brownie, Demetrius Charity.   CLINICAL INFORMATION/HISTORY: 50 year old man with a history of stroke, degenerative disc disease, kidney stones, hypertension, arthritis, and obesity, who was previously diagnosed with obstructive sleep apnea and placed on CPAP therapy.   Epworth sleepiness score: 9/24.  BMI: 39.7 kg/m  Neck Circumference: 19-5/8 "  FINDINGS:   Total Record Time (hours, min): 7 H 0 min  Total Sleep Time (hours, min):  5 H 37 min   Percent REM (%):    26.03 %   Calculated pAHI (per hour): 22.5       REM pAHI: 51.1  NREM pAHI: 16.0 Supine AHI: 33.4   Oxygen Saturation (%) Mean: 94  Minimum oxygen saturation (%):        75   O2 Saturation Range (%): 75-98  O2Saturation (minutes) <=88%: 2.1 min  Pulse Mean (bpm):    81  Pulse Range (60-109)   IMPRESSION: OSA (obstructive sleep apnea)  RECOMMENDATION:  This home sleep test demonstrates moderate obstructive sleep apnea with a total AHI of 22.5/hour and O2 nadir of 75%.  Snoring was noted, in the mild to moderate range.  Ongoing treatment with positive airway pressure is recommended. The patient will be advised to start treatment with a new AutoPap machine. A full night titration study may be considered to optimize treatment settings, if needed down the road. Please note that untreated obstructive sleep apnea may carry additional perioperative morbidity. Patients with significant obstructive sleep apnea should receive perioperative PAP therapy and the surgeons and particularly the anesthesiologist should be informed of the diagnosis and the severity of the sleep disordered breathing. The patient should be cautioned not to drive, work at heights, or operate dangerous or heavy equipment when tired or sleepy. Review and reiteration of good sleep hygiene measures  should be pursued with any patient. Other causes of the patient's symptoms, including circadian rhythm disturbances, an underlying mood disorder, medication effect and/or an underlying medical problem cannot be ruled out based on this test. Clinical correlation is recommended. The patient and his referring provider will be notified of the test results. The patient will be seen in follow up in sleep clinic at [facility].  I certify that I have reviewed the raw data recording prior to the issuance of this report in accordance with the standards of the American Academy of Sleep Medicine (AASM).  INTERPRETING PHYSICIAN:  Huston Foley, MD, PhD  Board Certified in Neurology and Sleep Medicine  [facility] [address], [address] [place], [place] [address] [phone]

## 2020-09-03 NOTE — Progress Notes (Signed)
Patient referred by Dr. Pearlean Brownie and [name], seen by me on 07/11/20 for re-eval of OSA, patient had a HST on 08/26/20.    Please call and notify the patient that the recent home sleep test showed obstructive sleep apnea in the moderate range. I recommend ongoing treatment in the form of autoPAP, he would like to qualify for a new machine.  I will prescribe for an AutoPap machine through a DME company of his choice.   Please send the order. We will need a FU in sleep clinic for 10 weeks post-PAP set up, please arrange that with me or Jessica. Also reinforce the need for compliance with treatment. Thanks,   Huston Foley, MD, PhD [facility])

## 2020-09-03 NOTE — Addendum Note (Signed)
Addended by: Huston Foley on: 09/03/2020 11:55 AM   Modules accepted: Orders

## 2020-09-05 ENCOUNTER — Telehealth: Payer: Self-pay

## 2020-09-05 NOTE — Telephone Encounter (Signed)
I called pt and spoke with his wife ( ok per dpr). I advised that Dr. Frances Furbish reviewed their sleep study results and found that pt has moderate osa. Dr. Frances Furbish recommends that pt start an autopap at home. I reviewed PAP compliance expectations with the pt. I advised pt that an order will be sent to a DME, [facility], and [facility] will call the pt within about one week after they file with the pt's insurance. [facility] will show the pt how to use the machine, fit for masks, and troubleshoot the auto-PAP if needed.I advised of the national shortage of PAP machines and that start dates can be 8-12 weeks out. Pt will CB once he has started on the machine to scheduled f/u. A letter with all of this information in it will be mailed to the pt as a reminder. I verified with the pt that the address we have on file is correct. Pt verbalized understanding of results. Pt had no questions at this time but was encouraged to call back if questions arise. I have sent the order to [facility] and have received confirmation that they have received the order.  Wife was agreeable and will advised pt.

## 2020-09-05 NOTE — Telephone Encounter (Signed)
-----   Message from Huston Foley, MD sent at 09/03/2020 11:55 AM EDT ----- Patient referred by Dr. Pearlean Brownie and [name], seen by me on 07/11/20 for re-eval of OSA, patient had a HST on 08/26/20.    Please call and notify the patient that the recent home sleep test showed obstructive sleep apnea in the moderate range. I recommend ongoing treatment in the form of autoPAP, he would like to qualify for a new machine.  I will prescribe for an AutoPap machine through a DME company of his choice.   Please send the order. We will need a FU in sleep clinic for 10 weeks post-PAP set up, please arrange that with me or Jessica. Also reinforce the need for compliance with treatment. Thanks,   Huston Foley, MD, PhD [facility])

## 2020-09-18 ENCOUNTER — Ambulatory Visit (HOSPITAL_COMMUNITY): Payer: 59

## 2020-09-26 ENCOUNTER — Other Ambulatory Visit: Payer: Self-pay

## 2020-09-27 ENCOUNTER — Other Ambulatory Visit: Payer: Self-pay

## 2020-09-27 ENCOUNTER — Ambulatory Visit (HOSPITAL_COMMUNITY)
Admission: RE | Admit: 2020-09-27 | Discharge: 2020-09-27 | Disposition: A | Discharge status: Home / Self Care | Payer: 59 | Source: Ambulatory Visit | Attending: Physician Assistant | Admitting: Physician Assistant

## 2020-09-27 DIAGNOSIS — I10 Essential (primary) hypertension: Secondary | ICD-10-CM

## 2020-09-27 DIAGNOSIS — Z8774 Personal history of (corrected) congenital malformations of heart and circulatory system: Secondary | ICD-10-CM

## 2020-09-27 DIAGNOSIS — I2510 Atherosclerotic heart disease of native coronary artery without angina pectoris: Secondary | ICD-10-CM

## 2020-09-27 DIAGNOSIS — I253 Aneurysm of heart: Secondary | ICD-10-CM

## 2020-09-27 DIAGNOSIS — I77810 Thoracic aortic ectasia: Secondary | ICD-10-CM | POA: Diagnosis present

## 2020-09-27 DIAGNOSIS — Q211 Atrial septal defect: Secondary | ICD-10-CM

## 2020-09-27 DIAGNOSIS — E785 Hyperlipidemia, unspecified: Secondary | ICD-10-CM

## 2020-09-27 DIAGNOSIS — Q2112 Patent foramen ovale: Secondary | ICD-10-CM

## 2020-09-27 DIAGNOSIS — R918 Other nonspecific abnormal finding of lung field: Secondary | ICD-10-CM

## 2020-09-27 IMAGING — MR MR MRA CHEST W/ OR W/O CM
19 series · 19 of 19 positions shown · IV contrast (Gadavist)
Comparison: Cardiac CT-[DATE]

CLINICAL DATA: Evaluate for thoracic aortic aneurysm. History of
CAD. History of PFO with foramen ovale closure.

EXAM:
MRA CHEST WITH CONTRAST
TECHNIQUE: Angiographic images of the chest were obtained using MRA technique
with intravenous contrast.
CONTRAST:  10mL GADAVIST GADOBUTROL 1 MMOL/ML IV SOLN

[Series 2: t2_trufi_tra_p2_bh · axial · 8.0mm · 0.62mm/px · 1 of 26 slices shown]
[im 1/26]
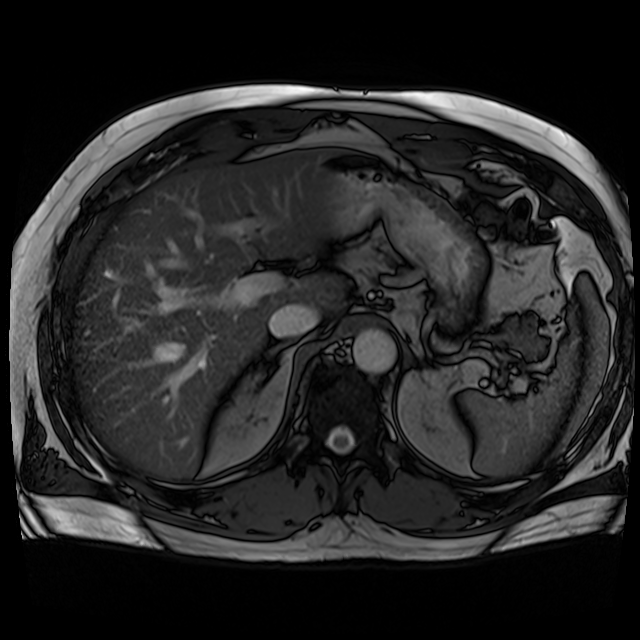

[Series 3: axial_db_haste_loc · axial · 7.0mm · 1.48mm/px · 1 of 29 slices shown]
[im 1/29]
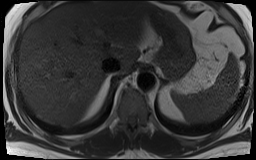

[Series 4: (person_name)_(person_name)_(person_name) · sagittal · 8.0mm · 1.79mm/px · 1 of 20 slices shown (1 of 9)]
[im 1/20]
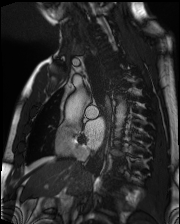

[Series 4: (person_name)_(person_name)_(person_name) · sagittal · 8.0mm · 1.79mm/px · 1 of 20 slices shown (2 of 9)]
[im 1/20]
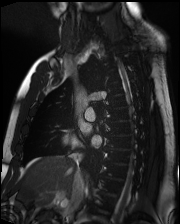

[Series 4: (person_name)_(person_name)_(person_name) · sagittal · 8.0mm · 1.79mm/px · 1 of 20 slices shown (3 of 9)]
[im 1/20]
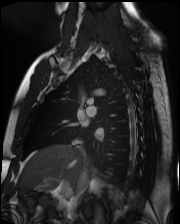

[Series 4: (person_name)_(person_name)_(person_name) · sagittal · 8.0mm · 1.79mm/px · 1 of 20 slices shown (4 of 9)]
[im 1/20]
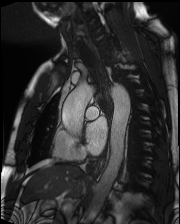

[Series 4: (person_name)_(person_name)_(person_name) · sagittal · 8.0mm · 1.79mm/px · 1 of 20 slices shown (5 of 9)]
[im 1/20]
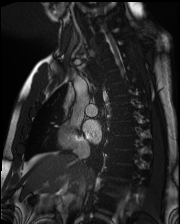

[Series 4: (person_name)_(person_name)_(person_name) · sagittal · 8.0mm · 1.79mm/px · 1 of 20 slices shown (6 of 9)]
[im 1/20]
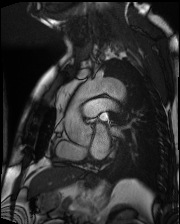

[Series 4: (person_name)_(person_name)_(person_name) · sagittal · 8.0mm · 1.79mm/px · 1 of 20 slices shown (7 of 9)]
[im 1/20]
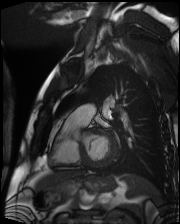

[Series 4: (person_name)_(person_name)_(person_name) · sagittal · 8.0mm · 1.79mm/px · 1 of 20 slices shown (8 of 9)]
[im 1/20]
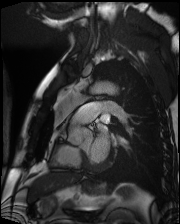

[Series 4: (person_name)_(person_name)_(person_name) · sagittal · 8.0mm · 1.79mm/px · 1 of 20 slices shown (9 of 9)]
[im 1/20]
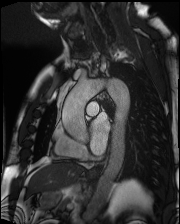

[Series 5: T1 dynamic · axial · non-contrast · 3.3mm · 1.18mm/px · 1 of 72 slices shown]
[im 1/72]
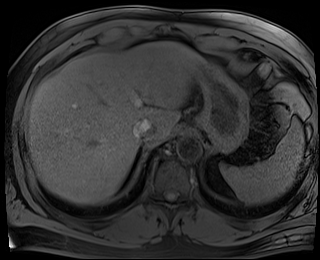

[Series 6: angio_fl3d_sag_pre · sagittal · 1.1mm · 1.17mm/px · 1 of 112 slices shown]
[im 1/112]
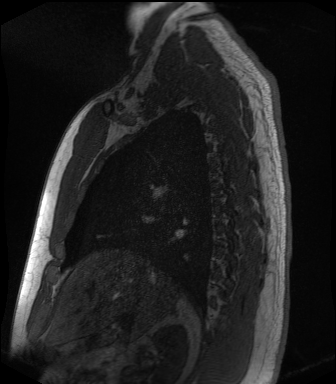

[Series 8: candy cane ce-arterial · sagittal · arterial · 1.1mm · 1.17mm/px · 1 of 112 slices shown]
[im 1/112]
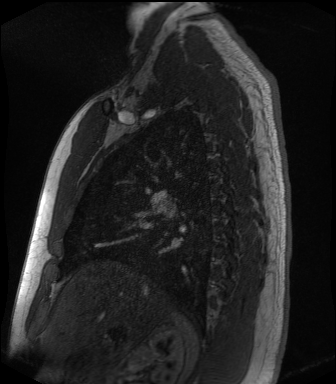

[Series 9: candy cane ce-arterial_sub · sagittal · 1.1mm · 1.17mm/px · 1 of 112 slices shown]
[im 1/112]
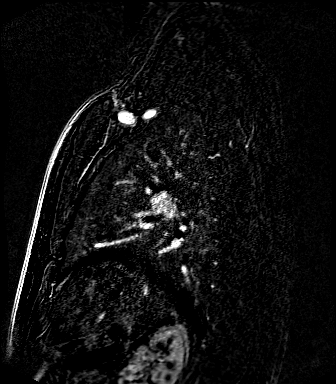

[Series 11: candy cane ce-venous · sagittal · portal-venous · 1.1mm · 1.17mm/px · 1 of 112 slices shown]
[im 1/112]
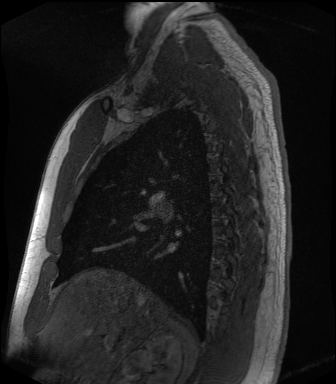

[Series 12: candy cane ce-venous_sub · sagittal · 1.1mm · 1.17mm/px · 1 of 112 slices shown]
[im 1/112]
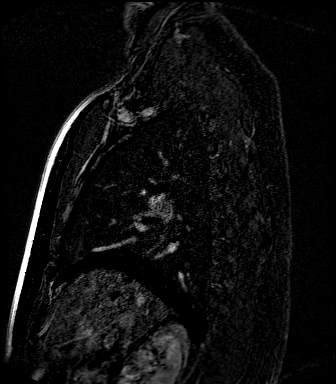

[Series 14: T1 dynamic post-contrast · axial · 3.3mm · 1.18mm/px · 1 of 72 slices shown]
[im 1/72]
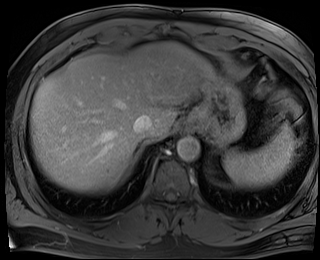

[Series 1019: mip tumble · axial · 1.1mm · 0.40mm/px · 1 of 5 slices shown]
[im 1/5]
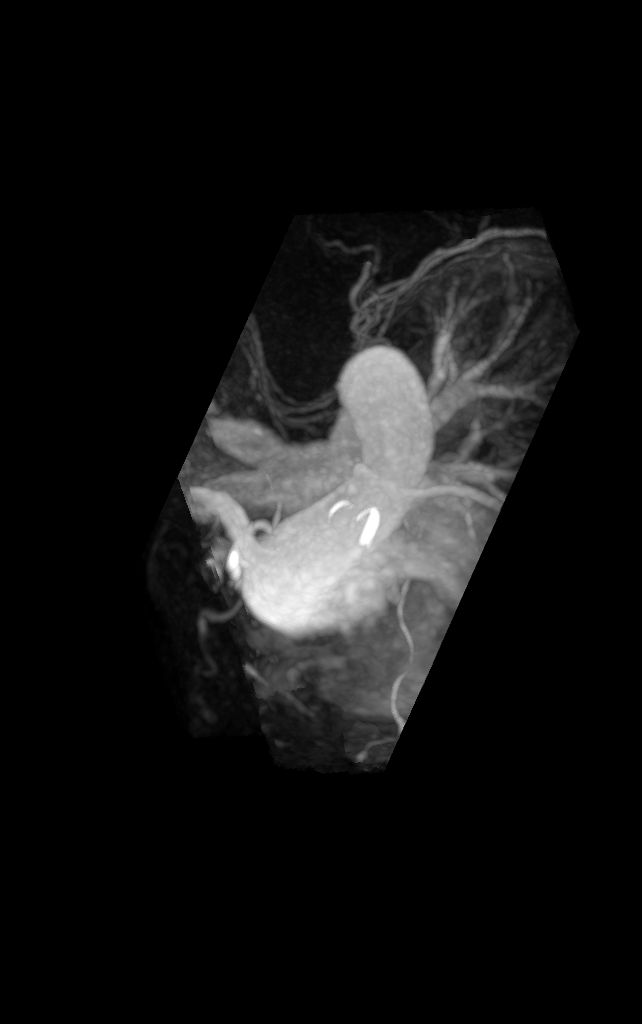

[19 of 19 positions shown; findings below may reference images not displayed]

FINDINGS: Vascular Findings:

Mild fusiform aneurysmal dilatation of the ascending thoracic aorta
with measurements as follows. The thoracic aorta tapers to a normal
caliber at the level of the aortic arch. Note is made of a small
ductus diverticulum. The descending thoracic aorta is of normal
caliber.

Conventional configuration of the aortic arch. The branch vessels of
the aortic arch appear widely patent throughout their imaged
courses.

Cardiomegaly. Susceptibility artifact associated with reported
history of PF of closure. No pericardial effusion.

Although this examination was not tailored for the evaluation the
pulmonary arteries, there are no discrete filling defects within the
central pulmonary arterial tree to suggest central pulmonary
embolism. Normal caliber of the main pulmonary artery.

-------------------------------------------------------------

Thoracic aortic measurements:

SINOTUBULAR JUNCTION: 40 mm as measured in greatest oblique short
axis sagittal dimension (image 11, series 4).

PROXIMAL ASCENDING THORACIC AORTA: 41 mm as measured in greatest
oblique short axis axial dimension at the level of the main
pulmonary artery (image 1, series 4) and approximately 41 mm in
greatest oblique short axis axial diameter (image 34, series 14).

AORTIC ARCH: 30 mm as measured in greatest oblique short axis
sagittal dimension.

PROXIMAL DESCENDING THORACIC AORTA: 27 mm as measured in greatest
oblique short axis axial dimension at the level of the main
pulmonary artery (image 6, series 4).

DISTAL DESCENDING THORACIC AORTA: 26 mm as measured in greatest
oblique short axis sagittal dimension at the level of the
diaphragmatic hiatus (image 15, series 4).

Review of the MIP images confirms the above findings.

-------------------------------------------------------------

Non-Vascular Findings:

Mediastinum/Lymph Nodes: No bulky mediastinal, hilar or axillary
lymphadenopathy.

Lungs/Pleura: Minimal dependent subpleural ground-glass atelectasis.
No discrete focal airspace opacities. No pleural effusion.

Upper abdomen: Limited evaluation of the upper abdomen is normal.

Musculoskeletal: No acute osseous abnormalities.
IMPRESSION: Mild uncomplicated fusiform aneurysmal dilatation of the ascending
thoracic aorta measuring 41 mm in diameter. Recommend annual imaging
followup by CTA or MRA. This recommendation follows [GI]
ACCF/AHA/AATS/ACR/ASA/SCA/KURDI/KURDI/KURDI/KURDI Guidelines for the
Diagnosis and Management of Patients with Thoracic Aortic Disease.
Circulation. [GI]; 121: [id number]. Aortic aneurysm NOS ([GI]-[GI])

## 2020-09-27 MED ORDER — GADOBUTROL 1 MMOL/ML IV SOLN
10.0000 mL | Freq: Once | INTRAVENOUS | Status: AC | PRN
  Administered 2020-09-27: 10 mL via INTRAVENOUS

## 2020-09-29 ENCOUNTER — Encounter: Payer: Self-pay | Admitting: Physician Assistant

## 2020-09-29 DIAGNOSIS — I712 Thoracic aortic aneurysm, without rupture, unspecified: Secondary | ICD-10-CM | POA: Insufficient documentation

## 2020-10-02 ENCOUNTER — Other Ambulatory Visit: Payer: Self-pay | Admitting: *Deleted

## 2020-10-02 DIAGNOSIS — I253 Aneurysm of heart: Secondary | ICD-10-CM

## 2020-10-02 DIAGNOSIS — Q211 Atrial septal defect: Secondary | ICD-10-CM

## 2020-10-03 ENCOUNTER — Other Ambulatory Visit: Payer: 59

## 2020-10-03 ENCOUNTER — Other Ambulatory Visit: Payer: Self-pay

## 2020-10-03 DIAGNOSIS — Q211 Atrial septal defect: Secondary | ICD-10-CM

## 2020-10-03 DIAGNOSIS — Q2112 Patent foramen ovale: Secondary | ICD-10-CM

## 2020-10-04 LAB — BASIC METABOLIC PANEL
BUN/Creatinine Ratio: 8 — ABNORMAL LOW (ref 9–20)
BUN: 9 mg/dL (ref 6–24)
CO2: 23 mmol/L (ref 20–29)
Calcium: 9.4 mg/dL (ref 8.7–10.2)
Chloride: 106 mmol/L (ref 96–106)
Creatinine, Ser: 1.12 mg/dL (ref 0.76–1.27)
Glucose: 138 mg/dL — ABNORMAL HIGH (ref 65–99)
Potassium: 4.5 mmol/L (ref 3.5–5.2)
Sodium: 143 mmol/L (ref 134–144)
eGFR: 81 mL/min/{1.73_m2} (ref >59)

## 2020-10-07 ENCOUNTER — Telehealth: Payer: Self-pay | Admitting: Cardiovascular Disease

## 2020-10-07 NOTE — Telephone Encounter (Signed)
Minimal plaque noted on CT in 2/22.  This is overall reassuring.   Please schedule appt tomorrow for evaluation. He should go to the ED if chest pain changes or worsens. Tereso Newcomer, [name]    10/07/2020 2:12 PM

## 2020-10-07 NOTE — Telephone Encounter (Signed)
Pt c/o of Chest Pain: STAT if CP now or developed within 24 hours  1. Are you having CP right now? no  2. Are you experiencing any other symptoms (ex. SOB, nausea, vomiting, sweating)? no  3. How long have you been experiencing CP? Since 05/13 when he had his MRI  4. Is your CP continuous or coming and going? Coming and going  5. Have you taken Nitroglycerin? No  ?  Patient states that he has been having intermediate chest pains since his MRI on 05/13. With his history he is a little concerned about the chest pain.

## 2020-10-07 NOTE — Telephone Encounter (Signed)
Spoke with pt who reports increasing Intermittent CP over his left breast since MRI on 09/27/2020.  Pt denies current active CP. Pt states worse over the last 2 days.  He reports the CP can occur several times daily and resolves with rest.  He does work in the heat and is staying hydrated ,eating well and denies any additional symptoms at this time.  He is taking medications as prescribed.   Will forward information to Tereso Newcomer, [name] for review and recommendation.  Reviewed ED precautions.  Pt verbalizes understanding and agrees with current plan.

## 2020-10-07 NOTE — Progress Notes (Signed)
Cardiology Office Note   Date:  10/08/2020   ID:  Henry, [name] 1971/05/03, MRN [id number]  PCP:  Sigmund Hazel, MD    No chief complaint on file.  Chest pain, CAD  Wt Readings from Last 3 Encounters:  10/08/20 (!) 325 lb (147.4 kg)  08/14/20 (!) 331 lb 12.8 oz (150.5 kg)  07/31/20 (!) 326 lb (147.9 kg)       History of Present Illness: [name] is a 50 y.o. 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 PFO s/p closure in 9/21  Hypertension   Hyperlipidemia   Anxiety/depression   Severe OSA on CPAP   FHx of CAD  Prior CV studies: Coronary CTA 07/05/2020 Ascending aorta dilated 41 mm RCA trivial plaque mid and distal (<25) LAD minimal proximal (<25) LCx trivial plaque proximal and mid (<25); OM2 trivial plaque Amplatzer PFO occlusion device in the interatrial septum Calcium score 336 (98th percentile) Multifocal areas of nodularity particularly in the LEFT upper lobe, with a few ill-defined nodules. >>  Needs follow-up CT in 3 months Hepatic steatosis  Echocardiogram 07/25/2020 Ascending aortic aneurysm 44 mm EF 55-60, no RWMA, GR 1 DD, GLS -18.6, normal RVSF, AV sclerosis without stenosis, aortic root 41 mm; no atrial level shunt detected   He had an MRA for thoracic aneurysm.  He developed chest pain with that test.  No rash.  Had a headache.  He has had some chest pain starting 4-5 days after that.  No problems with walking.    Past Medical History:  Diagnosis Date  . Arthritis    neck  . Carpal tunnel syndrome on both sides 08/2014  . DDD (degenerative disc disease), lumbar   . History of kidney stones   . Hypertension    under control with med., has been on med. x 1 yr.  . Sleep apnea    CPAP but does not use  . Stroke (HCC) 11/16/2019  . Thoracic aortic aneurysm (HCC)    MRA 5/22: Ascending thoracic aorta aneurysm 41 mm    Past Surgical History:  Procedure Laterality Date  .  BUBBLE STUDY  11/21/2019   Procedure: BUBBLE STUDY;  Surgeon: [name], MD;  Location: [facility] ENDOSCOPY;  Service: Cardiovascular;;  . CARPAL TUNNEL RELEASE Left 09/07/2014   Procedure: LEFT CARPAL TUNNEL RELEASE;  Surgeon: [name], MD;  Location: [facility];  Service: Orthopedics;  Laterality: Left;  . CARPAL TUNNEL RELEASE Right 10/12/2014   Procedure: RIGHT CARPAL TUNNEL RELEASE;  Surgeon: [name], MD;  Location: [facility];  Service: Orthopedics;  Laterality: Right;  . KNEE ARTHROSCOPY Left   . PATENT FORAMEN OVALE(PFO) CLOSURE N/A 01/31/2020   Procedure: PATENT FORAMEN OVALE (PFO) CLOSURE;  Surgeon: [name], MD;  Location: [facility] INVASIVE CV LAB;  Service: Cardiovascular;  Laterality: N/A;  . SUBOCCIPITAL CRANIECTOMY CERVICAL LAMINECTOMY N/A 11/17/2019   Procedure: SUBOCCIPITAL DECOMPRESSIVE CRANIECTOMY;  Surgeon: [name], MD;  Location: [facility] OR;  Service: Neurosurgery;  Laterality: N/A;  . TEE WITHOUT CARDIOVERSION N/A 11/21/2019   Procedure: TRANSESOPHAGEAL ECHOCARDIOGRAM (TEE);  Surgeon: [name], MD;  Location: [facility] ENDOSCOPY;  Service: Cardiovascular;  Laterality: N/A;     Current Outpatient Medications  Medication Sig Dispense Refill  . aspirin EC 81 MG EC tablet Take 1 tablet (81 mg total) by mouth daily. Swallow whole. 30 tablet 11  . atorvastatin (LIPITOR) 80 MG tablet Take 1 tablet by  mouth daily.    . clonazePAM (KLONOPIN) 0.5 MG tablet Take 1 tablet (0.5 mg total) by mouth 2 (two) times daily as needed for anxiety (anxiety, sleep, dizziness). 60 tablet 0  . Docusate Calcium (STOOL SOFTENER PO) Take 1 tablet by mouth every other day. OTC    . ezetimibe (ZETIA) 10 MG tablet Take 1 tablet (10 mg total) by mouth daily. 90 tablet 3  . losartan (COZAAR) 50 MG tablet Take 50 mg by mouth daily.    . meclizine (ANTIVERT) 25 MG tablet TAKE 1 TABLET(25 MG) BY MOUTH THREE TIMES DAILY AS NEEDED FOR DIZZINESS OR NAUSEA  90 tablet 1   No current facility-administered medications for this visit.    Allergies:   Patient has no known allergies.    Social History:  The patient  reports that he has never smoked. He has never used smokeless tobacco. He reports previous alcohol use. He reports that he does not use drugs.   Family History:  The patient's family history includes Heart attack in his mother.    ROS:  Please see the history of present illness.   Otherwise, review of systems are positive for chest pain, anxious.   All other systems are reviewed and negative.    PHYSICAL EXAM: VS:  BP 116/78   Pulse 90   Ht 6\' 3"  (1.905 m)   Wt (!) 325 lb (147.4 kg)   BMI 40.62 kg/m  , BMI Body mass index is 40.62 kg/m. GEN: Well nourished, well developed, in no acute distress  HEENT: normal  Neck: no JVD, carotid bruits, or masses Cardiac: RRR; no murmurs, rubs, or gallops,no edema  Respiratory:  clear to auscultation bilaterally, normal work of breathing GI: soft, nontender, nondistended, + BS MS: no deformity or atrophy ; mild pain with palpation of lower sternum Skin: warm and dry, no rash Neuro:  Strength and sensation are intact Psych: euthymic mood, full affect   EKG:   The ekg ordered today demonstrates NSR, no ST changes   Recent Labs: 11/16/2019: Magnesium 1.7; TSH 1.525 06/28/2020: Hemoglobin 14.8; Platelets 255 08/30/2020: ALT 57 10/03/2020: BUN 9; Creatinine, Ser 1.12; Potassium 4.5; Sodium 143   Lipid Panel    Component Value Date/Time   CHOL 190 08/30/2020 0905   TRIG 62 08/30/2020 0905   HDL 37 (L) 08/30/2020 0905   CHOLHDL 5.1 (H) 08/30/2020 0905   CHOLHDL 4.5 11/16/2019 1623   VLDL 7 11/16/2019 1623   LDLCALC 141 (H) 08/30/2020 0905     Other studies Reviewed: Additional studies/ records that were reviewed today with results demonstrating: .   ASSESSMENT AND PLAN:  1.   Coronary artery disease involving native coronary artery of native heart with other angina  pectoris Mminimal plaque on coronary CTA.  Calcium score is 336 which places him in the 90th percentile.  Prior chest discomfort was noncardiac in 07/2020.   Continue aspirin, atorvastatin.  Current symptoms appear also to be noncardiac.  They occurred after his MRI.  I think he had some anxiety just given PFO device which is in place.  ECG not showing any signs of ischemia.  Prior coronary CT results as noted above.  2. PFO with atrial septal aneurysm . S/P percutaneous patent foramen ovale closure Most Recent echocardiogram with out evidence of interatrial shunt.   3. Ascending aorta dilation (HCC) 4.1 cm on recent coronary CTA.  stable on chest MRA in 2022.  5. Essential hypertension The patient's blood pressure is controlled on his current  regimen.  Continue current therapy.   6. Hyperlipidemia LDL goal <70 COntinue atorvastatin.         Current medicines are reviewed at length with the patient today.  The patient concerns regarding his medicines were addressed.  The following changes have been made:  No change  Labs/ tests ordered today include:  No orders of the defined types were placed in this encounter.   Recommend 150 minutes/week of aerobic exercise Low fat, low carb, high fiber diet recommended  Disposition:   FU with Dr. Excell Seltzer   Signed, Lance Muss, MD  10/08/2020 2:02 PM    [facility] [address] [place], [place], [place]  [address] Phone: [phone]; Fax: [phone]

## 2020-10-07 NOTE — Telephone Encounter (Signed)
Spoke with pt and advised of [employer] note and recommendations.  Appointment scheduled for 10/08/2020 by DOD, Dr Eldridge Dace at 130pm.  Pt verbalizes understanding and agrees with current plan.

## 2020-10-08 ENCOUNTER — Other Ambulatory Visit: Payer: Self-pay

## 2020-10-08 ENCOUNTER — Ambulatory Visit: Payer: 59 | Admitting: Interventional Cardiology

## 2020-10-08 ENCOUNTER — Encounter: Payer: Self-pay | Admitting: Interventional Cardiology

## 2020-10-08 VITALS — BP 116/78 | HR 90 | Ht 75.0 in | Wt 325.0 lb

## 2020-10-08 DIAGNOSIS — I10 Essential (primary) hypertension: Secondary | ICD-10-CM

## 2020-10-08 DIAGNOSIS — Q211 Atrial septal defect: Secondary | ICD-10-CM | POA: Diagnosis not present

## 2020-10-08 DIAGNOSIS — I25119 Atherosclerotic heart disease of native coronary artery with unspecified angina pectoris: Secondary | ICD-10-CM | POA: Diagnosis not present

## 2020-10-08 DIAGNOSIS — E782 Mixed hyperlipidemia: Secondary | ICD-10-CM | POA: Diagnosis not present

## 2020-10-08 DIAGNOSIS — Q2112 Patent foramen ovale: Secondary | ICD-10-CM

## 2020-10-08 NOTE — Patient Instructions (Signed)
Medication Instructions:  Your physician recommends that you continue on your current medications as directed. Please refer to the Current Medication list given to you today.  *If you need a refill on your cardiac medications before your next appointment, please call your pharmacy*   Lab Work: none If you have labs (blood work) drawn today and your tests are completely normal, you will receive your results only by: Marland Kitchen MyChart Message (if you have MyChart) OR . A paper copy in the mail If you have any lab test that is abnormal or we need to change your treatment, we will call you to review the results.   Testing/Procedures: none   Follow-Up: At [facility], you and your health needs are our priority.  As part of our continuing mission to provide you with exceptional heart care, we have created designated Provider Care Teams.  These Care Teams include your primary Cardiologist (physician) and Advanced Practice Providers (APPs -  Physician Assistants and Nurse Practitioners) who all work together to provide you with the care you need, when you need it.  We recommend signing up for the patient portal called "MyChart".  Sign up information is provided on this After Visit Summary.  MyChart is used to connect with patients for Virtual Visits (Telemedicine).  Patients are able to view lab/test results, encounter notes, upcoming appointments, etc.  Non-urgent messages can be sent to your provider as well.   To learn more about what you can do with MyChart, go to [email or web address].    Your next appointment:   As planned  The format for your next appointment:   In Person  Provider:   You may see [name], MD or one of the following Advanced Practice Providers on your designated Care Team:    Tereso Newcomer, [name]  Chelsea Aus, [place]    Other Instructions

## 2020-10-15 ENCOUNTER — Other Ambulatory Visit: Payer: Self-pay

## 2020-10-15 MED ORDER — CLONAZEPAM 0.5 MG PO TABS: 0.5000 mg | ORAL_TABLET | Freq: Two times a day (BID) | ORAL | 0 refills | Status: DC | PRN

## 2020-10-15 NOTE — Telephone Encounter (Signed)
Will approve for 1 additional month -discussed at prior visit which was documented in recent OV note that short term refill provided until he was able to schedule follow-up visit with his PCP as we was having difficulty scheduling follow-up visit.  He will need to schedule follow-up visit with PCP for ongoing refills after this additional refill. Please advise. Thank you

## 2020-10-15 NOTE — Telephone Encounter (Signed)
Received refill request for Clonazepam. Last OV was on 07/31/20 with Ihor Austin.   Last RX was written on 07/31/20 for 60 tabs  filled on 08/12/20  [place] Controlled Substance Registry checked and is appropriate.

## 2020-10-23 ENCOUNTER — Other Ambulatory Visit: Payer: Self-pay

## 2020-10-23 ENCOUNTER — Ambulatory Visit (INDEPENDENT_AMBULATORY_CARE_PROVIDER_SITE_OTHER)
Admission: RE | Admit: 2020-10-23 | Discharge: 2020-10-23 | Disposition: A | Discharge status: Home / Self Care | Payer: 59 | Source: Ambulatory Visit | Attending: Physician Assistant | Admitting: Physician Assistant

## 2020-10-23 DIAGNOSIS — I2510 Atherosclerotic heart disease of native coronary artery without angina pectoris: Secondary | ICD-10-CM

## 2020-10-23 DIAGNOSIS — Q2112 Patent foramen ovale: Secondary | ICD-10-CM

## 2020-10-23 DIAGNOSIS — R918 Other nonspecific abnormal finding of lung field: Secondary | ICD-10-CM | POA: Diagnosis not present

## 2020-10-23 DIAGNOSIS — Z8774 Personal history of (corrected) congenital malformations of heart and circulatory system: Secondary | ICD-10-CM

## 2020-10-23 DIAGNOSIS — I77810 Thoracic aortic ectasia: Secondary | ICD-10-CM

## 2020-10-23 DIAGNOSIS — I10 Essential (primary) hypertension: Secondary | ICD-10-CM

## 2020-10-23 DIAGNOSIS — E785 Hyperlipidemia, unspecified: Secondary | ICD-10-CM

## 2020-10-23 DIAGNOSIS — Q211 Atrial septal defect: Secondary | ICD-10-CM

## 2020-10-23 IMAGING — CT CT CHEST W/O CM
2 of 4 series · 15 of 36 positions shown, 18 images · non-contrast
Comparison: CT chest coronary angiogram, [DATE]

CLINICAL DATA: Follow-up pulmonary nodularity in left upper lobe

EXAM:
CT CHEST WITHOUT CONTRAST
TECHNIQUE: Multidetector CT imaging of the chest was performed following the
standard protocol without IV contrast.

[Series 2: thorax · axial · 0.93mm/px · z∈[-332,-72]mm · 12 of 154 slices shown, 15 images]
[im 12/154  mediastinal]
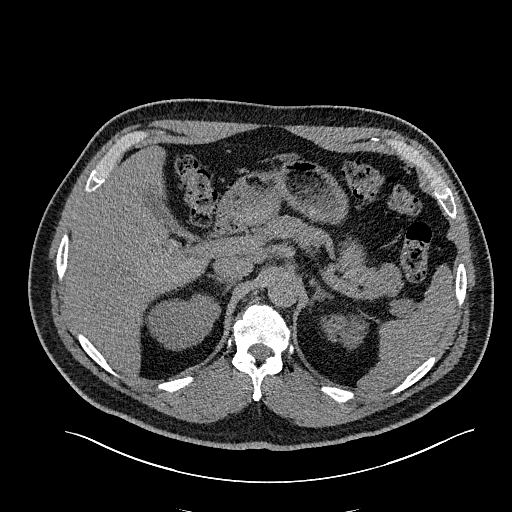
[im 12/154  lung]
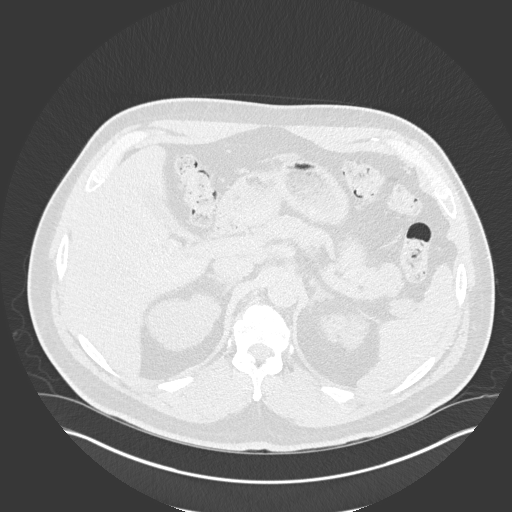
[im 24/154  lung]
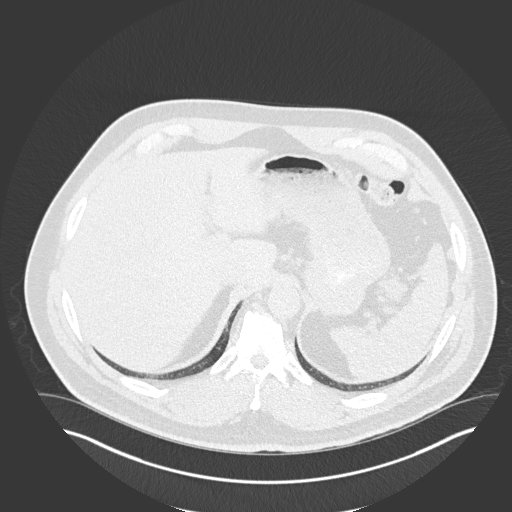
[im 36/154  lung]
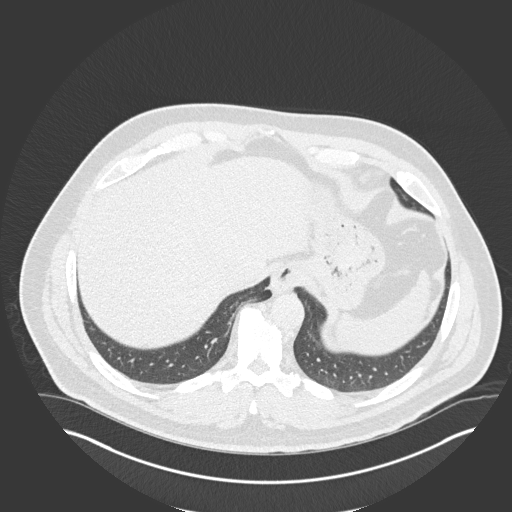
[im 48/154  lung]
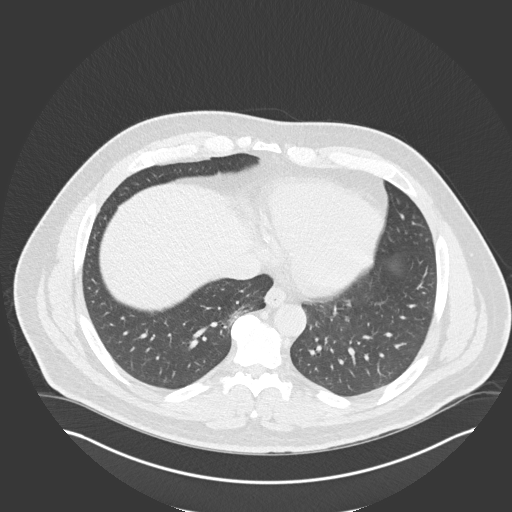
[im 59/154  mediastinal]
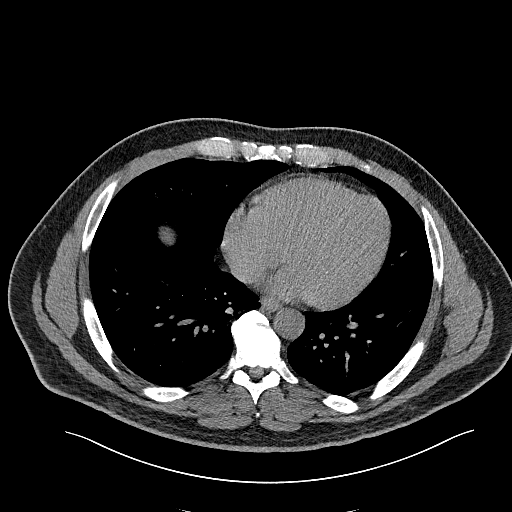
[im 59/154  lung]
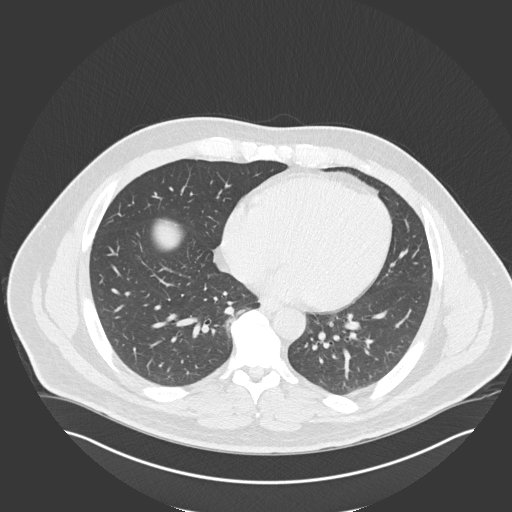
[im 71/154  lung]
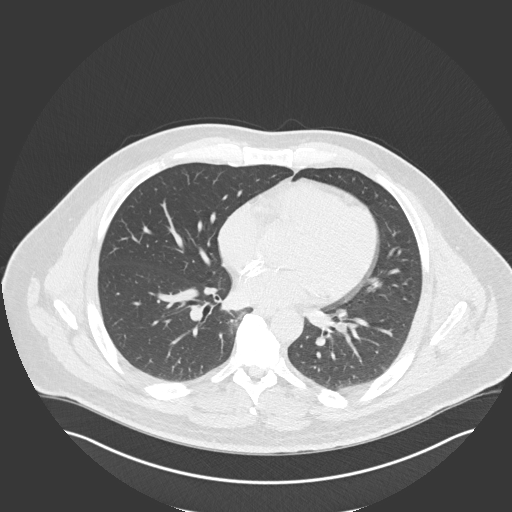
[im 83/154  lung]
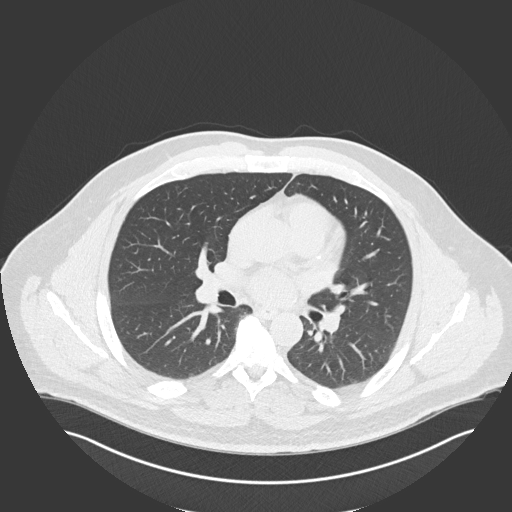
[im 95/154  lung]
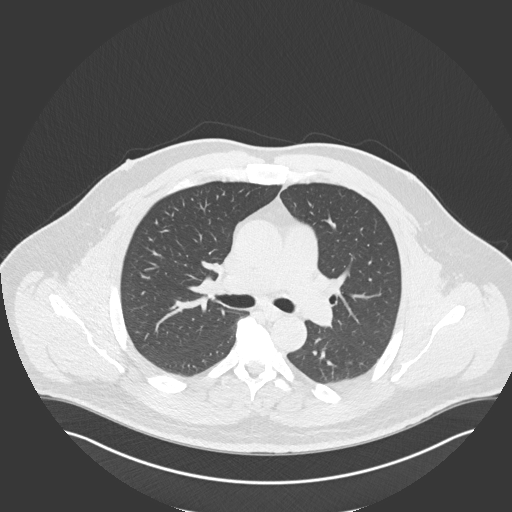
[im 106/154  mediastinal]
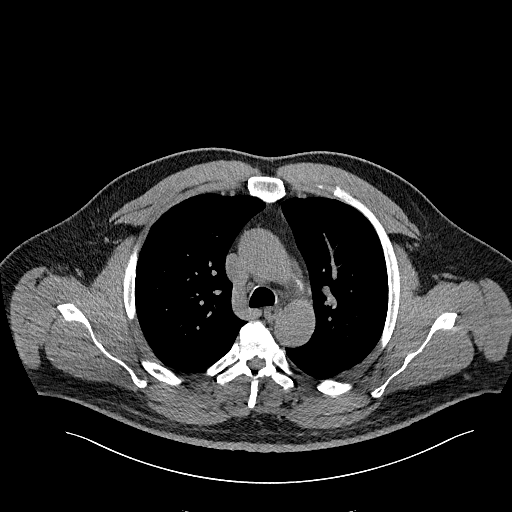
[im 106/154  lung]
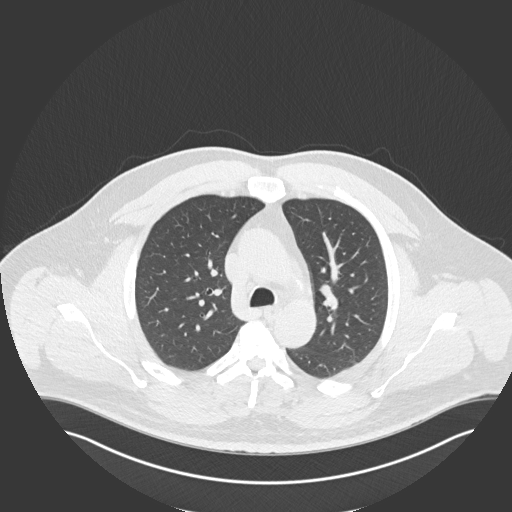
[im 118/154  lung]
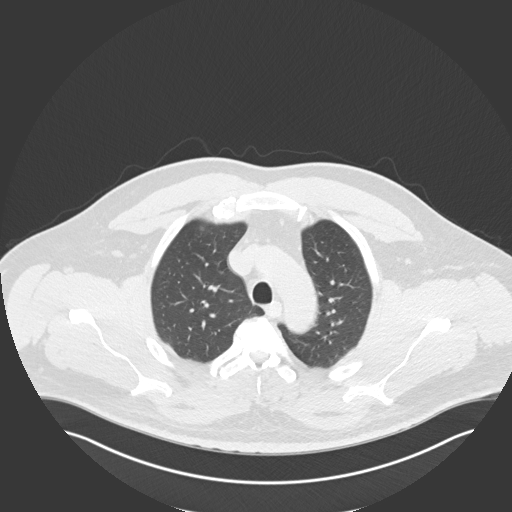
[im 130/154  lung]
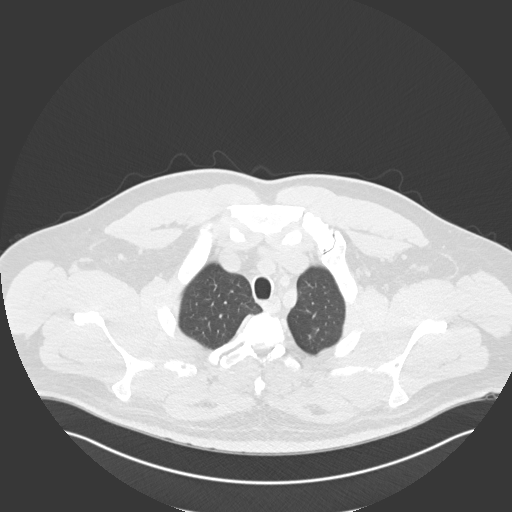
[im 142/154  lung]
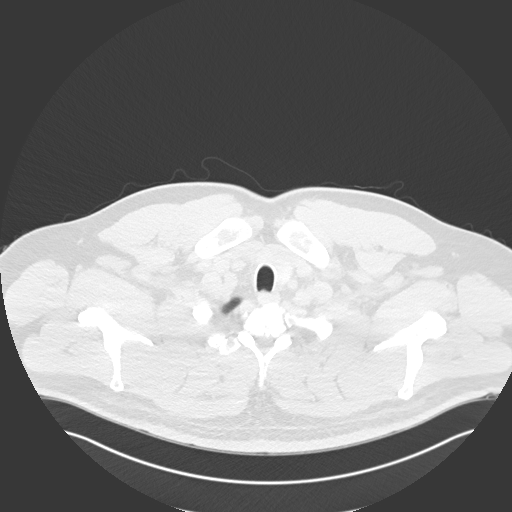

[Series 5: coronal · coronal · 0.60mm/px · 3 of 134 slices shown]
[im 27/134  lung]
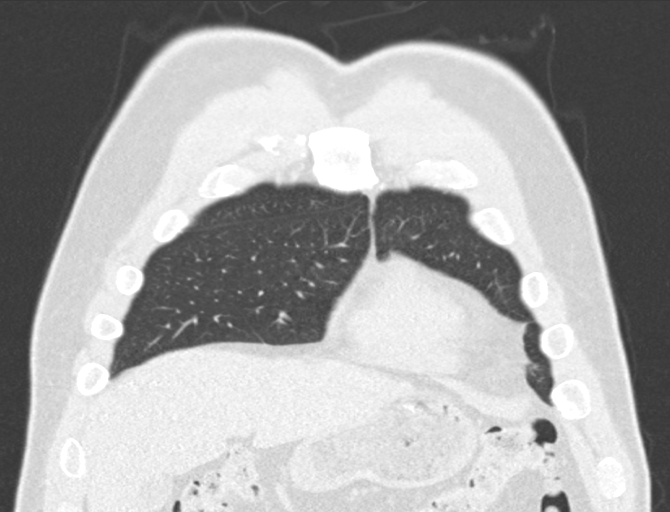
[im 54/134  lung]
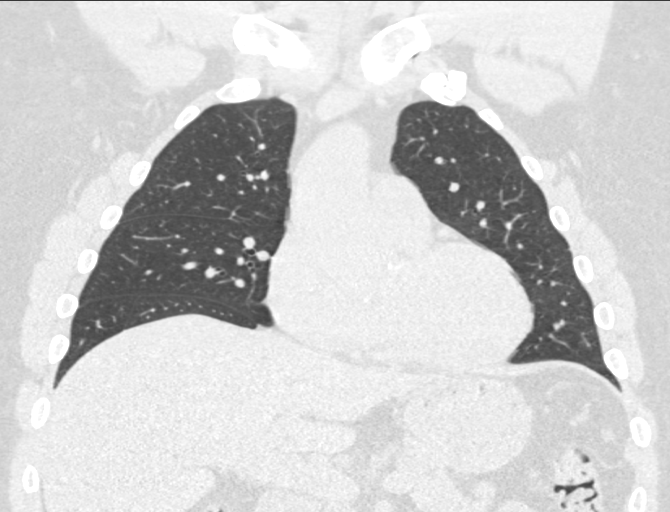
[im 80/134  lung]
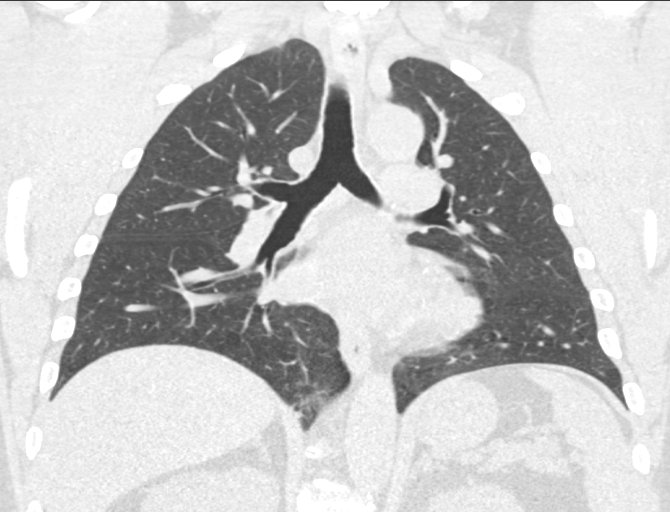

[15 of 36 positions shown; findings below may reference images not displayed]

FINDINGS: Cardiovascular: Enlargement of the tubular ascending thoracic aorta,
measuring up to 4.2 x 4.2 cm. Scattered left coronary artery
calcifications. Normal heart size. Amplatzer type occlusion device
of the interatrial septum. No pericardial effusion.

Mediastinum/Nodes: No enlarged mediastinal, hilar, or axillary lymph
nodes. Thyroid gland, trachea, and esophagus demonstrate no
significant findings.

Lungs/Pleura: Lungs are clear. Previously noted nodularity and
ground-glass opacity in the perihilar left upper lobe is resolved.
No pleural effusion or pneumothorax.

Upper Abdomen: No acute abnormality. Gallstones in the partially
imaged gallbladder. Small nonobstructive calculi in the superior
pole of the right kidney.

Musculoskeletal: No chest wall mass or suspicious bone lesions
identified.
IMPRESSION: 1. Previously noted nodularity and ground-glass opacity in the
perihilar left upper lobe is resolved, consistent with resolved
infection or inflammation. No further follow-up or characterization
required.
2. Coronary artery disease.
3. Unchanged enlargement of the tubular ascending thoracic aorta,
measuring up to 4.2 x 4.2 cm. Recommend annual imaging followup by
CTA or MRA. This recommendation follows [12]
ACCF/AHA/AATS/ACR/ASA/SCA/JIM/JIM/JIM/JIM Guidelines for the
Diagnosis and Management of Patients with Thoracic Aortic Disease.
Circulation. [12]; 121: [id number]. Aortic aneurysm NOS ([12]-[12])
4. Cholelithiasis.
5. Small nonobstructive calculi in the superior pole of the right
kidney.

## 2020-10-24 ENCOUNTER — Encounter: Payer: Self-pay | Admitting: Physician Assistant

## 2020-12-02 ENCOUNTER — Other Ambulatory Visit: Payer: 59 | Admitting: *Deleted

## 2020-12-02 ENCOUNTER — Other Ambulatory Visit: Payer: Self-pay

## 2020-12-02 DIAGNOSIS — E785 Hyperlipidemia, unspecified: Secondary | ICD-10-CM

## 2020-12-02 LAB — LIPID PANEL
Chol/HDL Ratio: 3.7 ratio (ref 0.0–5.0)
Cholesterol, Total: 144 mg/dL (ref 100–199)
HDL: 39 mg/dL — ABNORMAL LOW (ref >39)
LDL Chol Calc (NIH): 92 mg/dL (ref 0–99)
Triglycerides: 65 mg/dL (ref 0–149)
VLDL Cholesterol Cal: 13 mg/dL (ref 5–40)

## 2020-12-02 LAB — HEPATIC FUNCTION PANEL
ALT: 52 IU/L — ABNORMAL HIGH (ref 0–44)
AST: 32 IU/L (ref 0–40)
Albumin: 4.4 g/dL (ref 4.0–5.0)
Alkaline Phosphatase: 107 IU/L (ref 44–121)
Bilirubin Total: 0.7 mg/dL (ref 0.0–1.2)
Bilirubin, Direct: 0.19 mg/dL (ref 0.00–0.40)
Total Protein: 7.4 g/dL (ref 6.0–8.5)

## 2020-12-04 ENCOUNTER — Other Ambulatory Visit: Payer: Self-pay | Admitting: *Deleted

## 2020-12-04 DIAGNOSIS — E782 Mixed hyperlipidemia: Secondary | ICD-10-CM

## 2021-02-26 ENCOUNTER — Ambulatory Visit (HOSPITAL_COMMUNITY): Payer: 59 | Attending: Cardiology

## 2021-02-26 ENCOUNTER — Ambulatory Visit: Payer: 59 | Admitting: Cardiology

## 2021-02-26 ENCOUNTER — Other Ambulatory Visit: Payer: Self-pay

## 2021-02-26 ENCOUNTER — Ambulatory Visit: Payer: 59 | Admitting: Physician Assistant

## 2021-02-26 ENCOUNTER — Encounter: Payer: Self-pay | Admitting: Cardiology

## 2021-02-26 VITALS — BP 148/92 | HR 85 | Ht 76.0 in | Wt 339.8 lb

## 2021-02-26 DIAGNOSIS — E782 Mixed hyperlipidemia: Secondary | ICD-10-CM | POA: Diagnosis not present

## 2021-02-26 DIAGNOSIS — Q2112 Patent foramen ovale: Secondary | ICD-10-CM

## 2021-02-26 DIAGNOSIS — R002 Palpitations: Secondary | ICD-10-CM | POA: Insufficient documentation

## 2021-02-26 DIAGNOSIS — I7121 Aneurysm of the ascending aorta, without rupture: Secondary | ICD-10-CM

## 2021-02-26 DIAGNOSIS — R319 Hematuria, unspecified: Secondary | ICD-10-CM | POA: Insufficient documentation

## 2021-02-26 DIAGNOSIS — I253 Aneurysm of heart: Secondary | ICD-10-CM

## 2021-02-26 DIAGNOSIS — I639 Cerebral infarction, unspecified: Secondary | ICD-10-CM

## 2021-02-26 DIAGNOSIS — I10 Essential (primary) hypertension: Secondary | ICD-10-CM | POA: Diagnosis not present

## 2021-02-26 DIAGNOSIS — Z789 Other specified health status: Secondary | ICD-10-CM

## 2021-02-26 DIAGNOSIS — Z8774 Personal history of (corrected) congenital malformations of heart and circulatory system: Secondary | ICD-10-CM

## 2021-02-26 DIAGNOSIS — I77810 Thoracic aortic ectasia: Secondary | ICD-10-CM

## 2021-02-26 LAB — ECHOCARDIOGRAM COMPLETE BUBBLE STUDY
Area-P 1/2: 4.89 cm2
S' Lateral: 3.2 cm

## 2021-02-26 NOTE — Progress Notes (Signed)
[facility]   MULTIDISCIPLINARY HEART VALVE CLINIC                                     Cardiology Office Note:    Date:  02/26/2021   ID:  SHERRELL FARISH, DOB 03-30-1971, MRN [id number]  PCP:  Henry Hazel, MD  [facility] Cardiologist:  Henry Bollman, MD   Referring MD: Henry Hazel, MD   Chief Complaint  Patient presents with   Follow-up   History of Present Illness:    [name] is a 50 y.o. male with a hx of CVA, PFO s/p percutaneous PFO closure 01/31/20 per Dr. Excell [name], HTN, HLD, anxiety, depression and severe sleep apnea on CPAP. Of note, he has a strong family hx on his mother with MI>death at 47yo.   [name] was in his normal state of health on 11/16/19 when he developed dizziness and gait abnormality, prompting ER evaluation. He was diagnosed with a left PICA territory infarction and a questionable punctate infarct in the left occipital lobe. CTA studies showed no large vessel atherosclerosis or stenosis. Echo was normal. TCD bubble study was positive for [name] shunt at rest. TEE showed a positive bubble study but no clear color flow across the interatrial septum. The patient ultimately developed obstructive hydrocephalus and underwent suboccipital craniotomy on 7/2. He ultimately went to [facility] and then discharged home.    He was seen by Dr. Excell [name] for consultation of percutaneous PFO closure on 12/20/19. The patient is a Psychologist, occupational and there was some question about CO2 exposure contributing to his CVA. Plans were made for continue with his current medical therapy, work with physical and occupational therapy, and follow-up with Dr. Pearlean Brownie as scheduled in September. If he decided he wanted PFO closure, Dr. Excell [name] would perform a careful intracardiac echo study with agitated saline to confirm the presence of a clinically significant PFO and close at that time, if present.   He sought a second opinion at [facility] neurology. Another TCD bubble study  was performed which was reportedly very abnormal.  The patient then called back to to schedule PFO closure.   He underwent successful transcatheter PFO closure using a 25 mm Amplatzer PFO Occluder under ICE and fluoroscopic guidance on 01/31/20 (stopped Plavix 07/2020). Post op limited echo showed normally functioning/placed Amplatzer occluder device. He was discharged home on ASA and Plavix x 6 months.   He has had multiple instances of chest pain since that time. He underwent CTA showed minimal coronary plaquing with a calcium score at 336 therefore chest pain felt to be non-cardiac. His Plavix was stopped 08/14/20. Recent echo 07/25/20 with no shunting.   He is here today for 1 year follow up after PFO closure. He is here alone and reports he is doing well. He has some residual neuromuscular pain since his stroke. He feels this was causing his previous chest pain. He denies SOB, palpitations, LE edema, dizziness or syncope. He stopped taking his Lipitor and Zetia due to leg pain. We discussed referral to our lipid clinic for PCSK9 inhibitor. He had his one year echocardiogram which appears normal however has not yet been formally read. Otherwise he continues to work and take care of his family without issues.   Past Medical History:  Diagnosis Date   Arthritis    neck   Carpal tunnel syndrome on both sides 08/2014  DDD (degenerative disc disease), lumbar    History of kidney stones    Hypertension    under control with med., has been on med. x 1 yr.   Lung nodule    noted on CTA in 2/22 // Chest CT WO contrast 6/22: LUL nodule resolved, ascending aorta 4.2 x 4.2 cm, cholelithiasis, R kidney stone   Sleep apnea    CPAP but does not use   Stroke (HCC) 11/16/2019   Thoracic aortic aneurysm    MRA 5/22: Ascending thoracic aorta aneurysm 41 mm    Past Surgical History:  Procedure Laterality Date   BUBBLE STUDY  11/21/2019   Procedure: BUBBLE STUDY;  Surgeon: [name], MD;   Location: [facility] ENDOSCOPY;  Service: Cardiovascular;;   CARPAL TUNNEL RELEASE Left 09/07/2014   Procedure: LEFT CARPAL TUNNEL RELEASE;  Surgeon: [name], MD;  Location: [facility];  Service: Orthopedics;  Laterality: Left;   CARPAL TUNNEL RELEASE Right 10/12/2014   Procedure: RIGHT CARPAL TUNNEL RELEASE;  Surgeon: [name], MD;  Location: [facility];  Service: Orthopedics;  Laterality: Right;   KNEE ARTHROSCOPY Left    PATENT FORAMEN OVALE(PFO) CLOSURE N/A 01/31/2020   Procedure: PATENT FORAMEN OVALE (PFO) CLOSURE;  Surgeon: Henry Bollman, MD;  Location: [facility] INVASIVE CV LAB;  Service: Cardiovascular;  Laterality: N/A;   SUBOCCIPITAL CRANIECTOMY CERVICAL LAMINECTOMY N/A 11/17/2019   Procedure: SUBOCCIPITAL DECOMPRESSIVE CRANIECTOMY;  Surgeon: [name], MD;  Location: [facility] OR;  Service: Neurosurgery;  Laterality: N/A;   TEE WITHOUT CARDIOVERSION N/A 11/21/2019   Procedure: TRANSESOPHAGEAL ECHOCARDIOGRAM (TEE);  Surgeon: [name], MD;  Location: [facility] ENDOSCOPY;  Service: Cardiovascular;  Laterality: N/A;    Current Medications: Current Meds  Medication Sig   clonazePAM (KLONOPIN) 0.5 MG tablet Take 1 tablet (0.5 mg total) by mouth 2 (two) times daily as needed for anxiety (anxiety, sleep, dizziness).   losartan (COZAAR) 50 MG tablet Take 50 mg by mouth daily.     Allergies:   Patient has no known allergies.   Social History   Socioeconomic History   Marital status: Married    Spouse name: [name]   Number of children: Not on file   Years of education: Not on file   Highest education level: Not on file  Occupational History   Not on file  Tobacco Use   Smoking status: Never   Smokeless tobacco: Never  Vaping Use   Vaping Use: Former  Substance and Sexual Activity   Alcohol use: Not Currently    Comment: quit 2006   Drug use: No   Sexual activity: Yes  Other Topics Concern   Not on file  Social History Narrative   Right  handed    Lives with wife    Social Determinants of Health   Financial Resource Strain: Not on file  Food Insecurity: Not on file  Transportation Needs: Not on file  Physical Activity: Not on file  Stress: Not on file  Social Connections: Not on file     Family History: The patient's family history includes Heart attack in his mother.  ROS:   Please see the history of present illness.    All other systems reviewed and are negative.  EKGs/Labs/Other Studies Reviewed:    The following studies were reviewed today:  Echocardiogram 02/26/21:   1. Left ventricular ejection fraction, by estimation, is 60 to 65%. The  left ventricle has normal function. The left ventricle has no regional  wall motion abnormalities. There is  mild left ventricular hypertrophy of  the basal-septal segment. Left  ventricular diastolic parameters were normal.   2. Right ventricular systolic function is normal. The right ventricular  size is normal. There is normal pulmonary artery systolic pressure.   3. Left atrial size was mildly dilated.   4. Right atrial size was mildly dilated.   5. The mitral valve is normal in structure. Trivial mitral valve  regurgitation. No evidence of mitral stenosis.   6. The aortic valve is normal in structure. There is mild calcification  of the aortic valve. Aortic valve regurgitation is not visualized. No  aortic stenosis is present.   7. Aortic dilatation noted. There is moderate dilatation of the aortic  root, measuring 46 mm. There is moderate dilatation of the ascending  aorta, measuring 44 mm.   8. The inferior vena cava is normal in size with greater than 50%  respiratory variability, suggesting right atrial pressure of 3 mmHg.   9. Agitated saline contrast bubble study was negative, with no evidence  of any interatrial shunt.   Coronary CTA 07/05/2020 Ascending aorta dilated 41 mm RCA trivial plaque mid and distal (<25) LAD minimal proximal (<25) LCx  trivial plaque proximal and mid (<25); OM2 trivial plaque Amplatzer PFO occlusion device in the interatrial septum Calcium score 336 (98th percentile) Multifocal areas of nodularity particularly in the LEFT upper lobe, with a few ill-defined nodules. >>  Needs follow-up CT in 3 months Hepatic steatosis   Echocardiogram 07/25/2020 Ascending aortic aneurysm 44 mm EF 55-60, no RWMA, GR 1 DD, GLS -18.6, normal RVSF, AV sclerosis without stenosis, aortic root 41 mm; no atrial level shunt detected   EKG:  EKG is not ordered today.    Recent Labs: 06/28/2020: Hemoglobin 14.8; Platelets 255 10/03/2020: BUN 9; Creatinine, Ser 1.12; Potassium 4.5; Sodium 143 12/02/2020: ALT 52  Recent Lipid Panel    Component Value Date/Time   CHOL 144 12/02/2020 0757   TRIG 65 12/02/2020 0757   HDL 39 (L) 12/02/2020 0757   CHOLHDL 3.7 12/02/2020 0757   CHOLHDL 4.5 11/16/2019 1623   VLDL 7 11/16/2019 1623   LDLCALC 92 12/02/2020 0757   Physical Exam:    VS:  BP (!) 148/92   Pulse 85   Ht 6\' 4"  (1.93 m)   Wt (!) 339 lb 12.8 oz (154.1 kg)   SpO2 97%   BMI 41.36 kg/m     Wt Readings from Last 3 Encounters:  02/26/21 (!) 339 lb 12.8 oz (154.1 kg)  10/08/20 (!) 325 lb (147.4 kg)  08/14/20 (!) 331 lb 12.8 oz (150.5 kg)    General: Well developed, well nourished, NAD Neck: Negative for carotid bruits. No JVD Lungs:Clear to ausculation bilaterally. Breathing is unlabored. Cardiovascular: RRR with S1 S2. No murmurs Extremities: No edema. Neuro: Alert and oriented. No focal deficits. No facial asymmetry. MAE spontaneously. Psych: Responds to questions appropriately with normal affect.    ASSESSMENT/PLAN:    1. 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 Plavix previously stopped earlier this year. Reiterated the need for lifelong ASA. No further need for SBE. No bleeding issues. Echocardiogram with no evidence of intraatrial shunting as above.      2. Hx of CVA: -11/2019 with CVA complicated by hydrocephalous requiring craniotomy  -Discharged to inpatient rehab then home -Workup included echo with bubble study which was positive for PFO>>>closure performed 01/31/20 per Dr. 02/02/20 -Repeat echocardiogram today with no evidence of shunting however  awaiting formal review.    3. HTN: -Elevated today however reports more stable at home -Continue current regimen with Losartan    4. HLD: -Last LDL, 134 on 11/16/2019 -Self discontinued statin and Zetia due to leg pain -Offered Lipid clinic referral for possible PCSK9 inhibitor >>willing to proceed   5. Aortic Dilatation: -Previously underwent chest MRA which showed mild uncomplicated fusiform aneurysmal dilatation of the ascending thoracic aorta measuring 41 mm in diameter with recommendations for annual imaging followup by CTA or MRA>>due 2023 -Echo today with moderate dilatation of the aortic  root, measuring 46 mm, moderate dilatation of the ascending  aorta, measuring 44 mm. Root measurement from echo on 07/25/20 at 41mm, ascending aorta at 22mm   Medication Adjustments/Labs and Tests Ordered: Current medicines are reviewed at length with the patient today.  Concerns regarding medicines are outlined above.  Orders Placed This Encounter  Procedures   AMB Referral to [facility]    No orders of the defined types were placed in this encounter.   Patient Instructions  Medication Instructions:  Your physician recommends that you continue on your current medications as directed. Please refer to the Current Medication list given to you today.  *If you need a refill on your cardiac medications before your next appointment, please call your pharmacy*   Lab Work: None ordered   If you have labs (blood work) drawn today and your tests are completely normal, you will receive your results only by: MyChart Message (if you have MyChart) OR A paper copy in the mail If you have any  lab test that is abnormal or we need to change your treatment, we will call you to review the results.   Testing/Procedures: None ordered    Follow-Up: At [facility], you and your health needs are our priority.  As part of our continuing mission to provide you with exceptional heart care, we have created designated Provider Care Teams.  These Care Teams include your primary Cardiologist (physician) and Advanced Practice Providers (APPs -  Physician Assistants and Nurse Practitioners) who all work together to provide you with the care you need, when you need it.  We recommend signing up for the patient portal called "MyChart".  Sign up information is provided on this After Visit Summary.  MyChart is used to connect with patients for Virtual Visits (Telemedicine).  Patients are able to view lab/test results, encounter notes, upcoming appointments, etc.  Non-urgent messages can be sent to your provider as well.   To learn more about what you can do with MyChart, go to [email or web address].    Your next appointment:   12 month(s)  The format for your next appointment:   In Person  Provider:   You may see Henry Bollman, MD or one of the following Advanced Practice Providers on your designated Care Team:   Tereso Newcomer, [name] Chelsea Aus, [place]  Your physician has referred you to the Lipid Clinic   Other Instructions None     Signed, Georgie Chard, NP  02/26/2021 2:35 PM    [facility]

## 2021-02-26 NOTE — Patient Instructions (Signed)
Medication Instructions:  Your physician recommends that you continue on your current medications as directed. Please refer to the Current Medication list given to you today.  *If you need a refill on your cardiac medications before your next appointment, please call your pharmacy*   Lab Work: None ordered   If you have labs (blood work) drawn today and your tests are completely normal, you will receive your results only by: MyChart Message (if you have MyChart) OR A paper copy in the mail If you have any lab test that is abnormal or we need to change your treatment, we will call you to review the results.   Testing/Procedures: None ordered    Follow-Up: At [facility] you and your health needs are our priority.  As part of our continuing mission to provide you with exceptional heart care, we have created designated Provider Care Teams.  These Care Teams include your primary Cardiologist (physician) and Advanced Practice Providers (APPs -  Physician Assistants and Nurse Practitioners) who all work together to provide you with the care you need, when you need it.  We recommend signing up for the patient portal called "MyChart".  Sign up information is provided on this After Visit Summary.  MyChart is used to connect with patients for Virtual Visits (Telemedicine).  Patients are able to view lab/test results, encounter notes, upcoming appointments, etc.  Non-urgent messages can be sent to your provider as well.   To learn more about what you can do with MyChart, go to [email or web address].    Your next appointment:   12 month(s)  The format for your next appointment:   In Person  Provider:   You may see [name], MD or one of the following Advanced Practice Providers on your designated Care Team:   Tereso Newcomer, [name] Chelsea Aus, [place]  Your physician has referred you to the Lipid Clinic   Other Instructions None

## 2021-03-03 NOTE — Progress Notes (Signed)
Patient ID: [name]                 DOB: December 05, 1970                    MRN: [id number]     HPI: DERON POOLE is a 50 y.o. male patient of Dr. Excell Seltzer referred to lipid clinic by Georgie Chard, NP. PMH is significant for CVA (11/2019), PFO s/p percutaneous PFO closure 01/31/20 per Dr. Excell Seltzer, chest pain, HTN, HLD, obesity, anxiety, depression and severe sleep apnea on CPAP. Of note, he has a strong family hx on his mother with MI>death at 47yo. Coronary CT on 07/16/20 showed coronary calcium score of 336, which is the 98th percentile for age and sex matched control, and minimal (<25%) calcified plaque in the LAD, LCX, and RCA. Aggressive risk factor modification recommended with a high intensity statin. At visit on 02/26/21 with Georgie Chard, NP, he reported stopping his atorvastatin and ezetimibe due to leg pain. He was agreeable to referral for PCSK9i.   Today, patient arrives in good spirits. He states that he has been on two statins in the past, rosuvastatin years ago and atorvastatin most recently, and experienced myalgias with both. He states that since his stroke these pains have been even more painful. He stopped taking atorvastatin and ezetimibe because of this. He reports trying to improve his diet and is very active at work and around the house. His mother died from an MI at age 43. He is amenable to injectable treatment with PCSK9i.   Current Medications: none Intolerances: rosuvastatin and atorvastatin 80 mg (leg pain), ezetimibe 10 mg (leg pain) Risk Factors: CVA, HTN, HLD, coronary calcium score, premature Fhx ASCVD LDL goal: <70 mg/dL  Diet: reports trying to improve his diet but eats what tastes good; incorporating more vegetables and grilled chicken into his diet instead of fried foods  Exercise: very active at work and keeping up the yard, no formal exercise  Family History: MI in mother (deceased) at age 15  Social History: Never smoker  Labs: 12/02/20: TC 144, TG 65,  HDL 39, LDL 92 (atorvastatin 80 mg, ezetimibe 10 mg) 08/30/20: TC 190, TG 62, HDL 37, LDL 141 (atorvastatin 80 mg)   Past Medical History:  Diagnosis Date   Arthritis    neck   Carpal tunnel syndrome on both sides 08/2014   DDD (degenerative disc disease), lumbar    History of kidney stones    Hypertension    under control with med., has been on med. x 1 yr.   Lung nodule    noted on CTA in 2/22 // Chest CT WO contrast 6/22: LUL nodule resolved, ascending aorta 4.2 x 4.2 cm, cholelithiasis, R kidney stone   Sleep apnea    CPAP but does not use   Stroke (HCC) 11/16/2019   Thoracic aortic aneurysm    MRA 5/22: Ascending thoracic aorta aneurysm 41 mm    Current Outpatient Medications on File Prior to Visit  Medication Sig Dispense Refill   aspirin EC 81 MG EC tablet Take 1 tablet (81 mg total) by mouth daily. Swallow whole. (Patient not taking: Reported on 02/26/2021) 30 tablet 11   clonazePAM (KLONOPIN) 0.5 MG tablet Take 1 tablet (0.5 mg total) by mouth 2 (two) times daily as needed for anxiety (anxiety, sleep, dizziness). 60 tablet 0   Docusate Calcium (STOOL SOFTENER PO) Take 1 tablet by mouth every other day. OTC (Patient not taking: Reported on  02/26/2021)     losartan (COZAAR) 50 MG tablet Take 50 mg by mouth daily.     meclizine (ANTIVERT) 25 MG tablet TAKE 1 TABLET(25 MG) BY MOUTH THREE TIMES DAILY AS NEEDED FOR DIZZINESS OR NAUSEA (Patient not taking: Reported on 02/26/2021) 90 tablet 1   No current facility-administered medications on file prior to visit.    No Known Allergies  Assessment/Plan:  1. Hyperlipidemia - Last LDL of 92 not at goal <70 mg/dL. This was when he was on atorvastatin 80 mg and ezetimibe 10 mg which he is no longer able to tolerate, so his LDL is likely only higher than this now that he is on no lipid lowering treatment. He has tried and failed two statins (atorvastatin and rosuvastatin) at both high and low doses as well as ezetimibe due to muscle  pains. Recommend PCSK9i to lower LDL ~60% and reduce ASCVD risk given previous CVA, elevated coronary calcium score, and premature family history of ASCVD. Will start Praluent 150 mg. It does not appear Praluent requires a PA, which is preferred over Repatha on his plan. I've called the patient to let him know this has been sent in to his pharmacy and to let us know if his copay is above $25 in which case we can set him up with a copay card. I was unable to get through to his pharmacy to confirm the cost myself. He will let me know if he has any issues. He is already scheduled for labs to check lipids in January.    Pervis Hocking, PharmD PGY2 Ambulatory Care Pharmacy Resident 03/04/2021 9:05 AM

## 2021-03-04 ENCOUNTER — Encounter: Payer: Self-pay | Admitting: Pharmacist

## 2021-03-04 ENCOUNTER — Other Ambulatory Visit: Payer: Self-pay

## 2021-03-04 ENCOUNTER — Ambulatory Visit: Payer: 59 | Admitting: Student-PharmD

## 2021-03-04 DIAGNOSIS — E785 Hyperlipidemia, unspecified: Secondary | ICD-10-CM

## 2021-03-04 MED ORDER — PRALUENT 150 MG/ML ~~LOC~~ SOAJ: 150.0000 mg | SUBCUTANEOUS | 11 refills | Status: DC

## 2021-03-04 NOTE — Patient Instructions (Signed)
Nice to see you today!  Keep up the good work with diet and exercise. Aim for a diet full of vegetables, fruit and lean meats (chicken, [place], fish). Try to limit carbs (bread, pasta, sugar, rice) and red meat consumption.  Your goal LDL is less than 70 mg/dL, you're currently at 92 mg/dL back when you were on your medications.   Medication Changes: I am submitting a prior authorization to your insurance for either Repatha or Praluent, depending on what your insurance prefers. For either one it will be one injection (one pen) every 14 days.   Please give [place] a call at [phone] with any questions or concerns.  For Repatha/Praluent, inject once every other week (any day of the week that works for you) into the fatty skin of stomach, upper outer thigh or back of the arm. Clean the site with soap and warm water or an alcohol pad. Keep the medication in the fridge until you are ready to give your dose, then take it out and let warm up to room temperature for 30-60 mins.

## 2021-05-05 ENCOUNTER — Encounter (HOSPITAL_COMMUNITY): Payer: Self-pay | Admitting: *Deleted

## 2021-05-05 ENCOUNTER — Other Ambulatory Visit: Payer: Self-pay

## 2021-05-05 ENCOUNTER — Emergency Department (HOSPITAL_COMMUNITY): Payer: 59

## 2021-05-05 ENCOUNTER — Emergency Department (HOSPITAL_COMMUNITY)
Admission: EM | Admit: 2021-05-05 | Discharge: 2021-05-05 | Disposition: A | Discharge status: Home / Self Care | Payer: 59 | Attending: Emergency Medicine | Admitting: Emergency Medicine

## 2021-05-05 DIAGNOSIS — Z96652 Presence of left artificial knee joint: Secondary | ICD-10-CM | POA: Insufficient documentation

## 2021-05-05 DIAGNOSIS — R109 Unspecified abdominal pain: Secondary | ICD-10-CM | POA: Diagnosis present

## 2021-05-05 DIAGNOSIS — I10 Essential (primary) hypertension: Secondary | ICD-10-CM | POA: Diagnosis not present

## 2021-05-05 DIAGNOSIS — Z79899 Other long term (current) drug therapy: Secondary | ICD-10-CM | POA: Insufficient documentation

## 2021-05-05 DIAGNOSIS — Z7982 Long term (current) use of aspirin: Secondary | ICD-10-CM | POA: Diagnosis not present

## 2021-05-05 DIAGNOSIS — N200 Calculus of kidney: Secondary | ICD-10-CM

## 2021-05-05 LAB — CBC
HCT: 45.9 % (ref 39.0–52.0)
Hemoglobin: 16.1 g/dL (ref 13.0–17.0)
MCH: 32.3 pg (ref 26.0–34.0)
MCHC: 35.1 g/dL (ref 30.0–36.0)
MCV: 92.2 fL (ref 80.0–100.0)
Platelets: 227 10*3/uL (ref 150–400)
RBC: 4.98 MIL/uL (ref 4.22–5.81)
RDW: 12.8 % (ref 11.5–15.5)
WBC: 4.8 10*3/uL (ref 4.0–10.5)
nRBC: 0 % (ref 0.0–0.2)

## 2021-05-05 LAB — BASIC METABOLIC PANEL
Anion gap: 8 (ref 5–15)
BUN: 11 mg/dL (ref 6–20)
CO2: 22 mmol/L (ref 22–32)
Calcium: 9.2 mg/dL (ref 8.9–10.3)
Chloride: 109 mmol/L (ref 98–111)
Creatinine, Ser: 1.07 mg/dL (ref 0.61–1.24)
GFR, Estimated: >60 mL/min (ref >60)
Glucose, Bld: 134 mg/dL — ABNORMAL HIGH (ref 70–99)
Potassium: 3.6 mmol/L (ref 3.5–5.1)
Sodium: 139 mmol/L (ref 135–145)

## 2021-05-05 LAB — URINALYSIS, MICROSCOPIC (REFLEX): Bacteria, UA: NONE SEEN

## 2021-05-05 LAB — URINALYSIS, ROUTINE W REFLEX MICROSCOPIC
Bilirubin Urine: NEGATIVE
Glucose, UA: NEGATIVE mg/dL
Ketones, ur: NEGATIVE mg/dL
Leukocytes,Ua: NEGATIVE
Nitrite: NEGATIVE
Protein, ur: NEGATIVE mg/dL
Specific Gravity, Urine: >1.03 — ABNORMAL HIGH (ref 1.005–1.030)
pH: 6 (ref 5.0–8.0)

## 2021-05-05 IMAGING — CT CT RENAL STONE PROTOCOL
2 of 4 series · 17 of 46 positions shown, 19 images · non-contrast
Comparison: [DATE]

CLINICAL DATA: Left flank pain.

EXAM:
CT ABDOMEN AND PELVIS WITHOUT CONTRAST
TECHNIQUE: Multidetector CT imaging of the abdomen and pelvis was performed
following the standard protocol without IV contrast.

[Series 3: renal stone 5.0 · axial · 0.98mm/px · z∈[+747,+1222]mm · 14 of 105 slices shown, 16 images]
[im 5/105  soft-tissue]
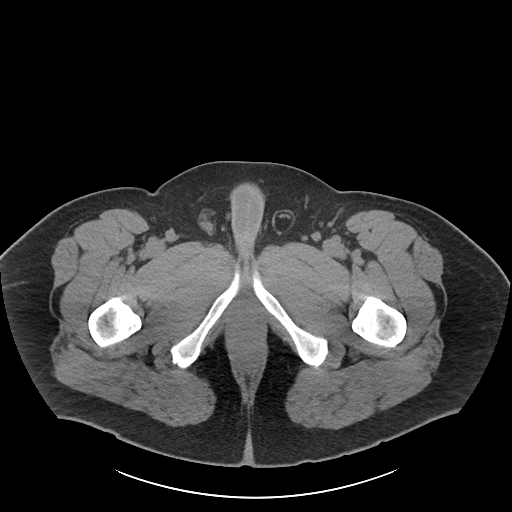
[im 5/105  bone]
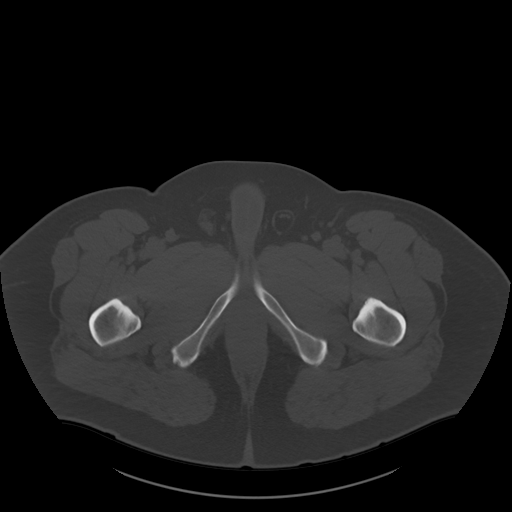
[im 14/105  soft-tissue]
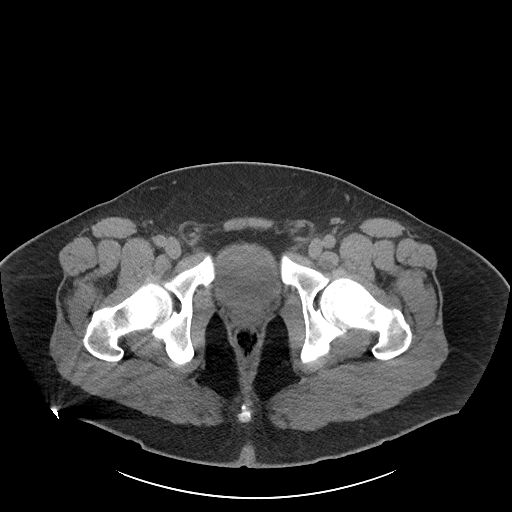
[im 22/105  soft-tissue]
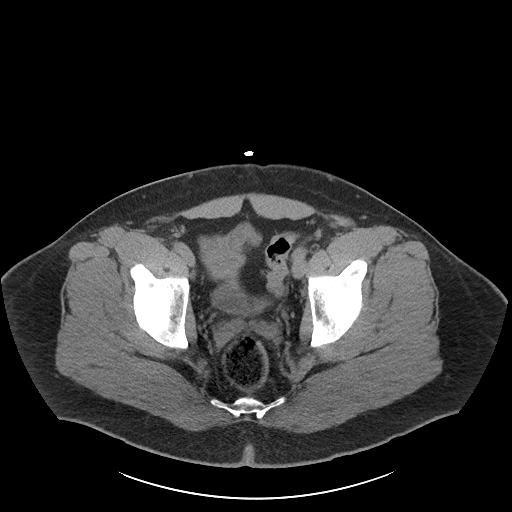
[im 27/105  soft-tissue]
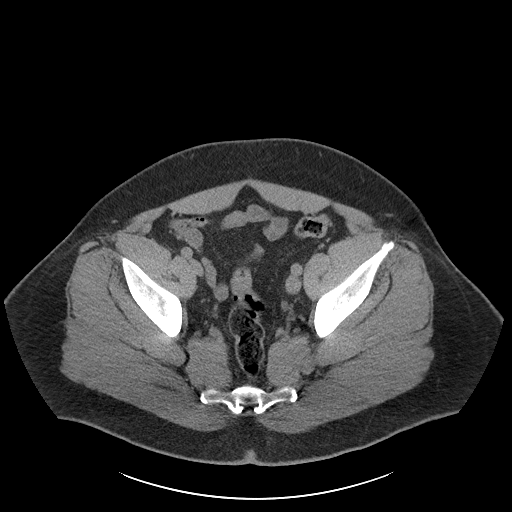
[im 35/105  soft-tissue]
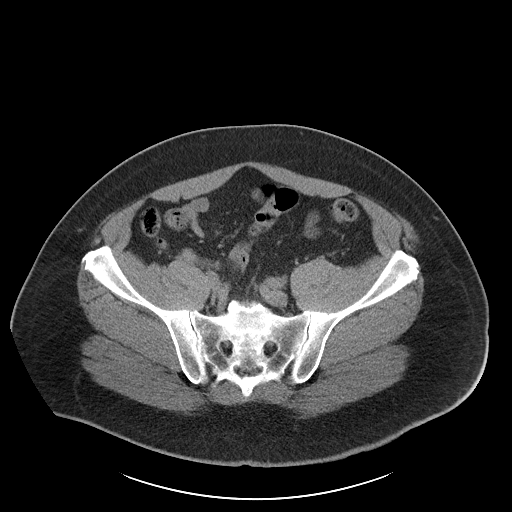
[im 44/105  soft-tissue]
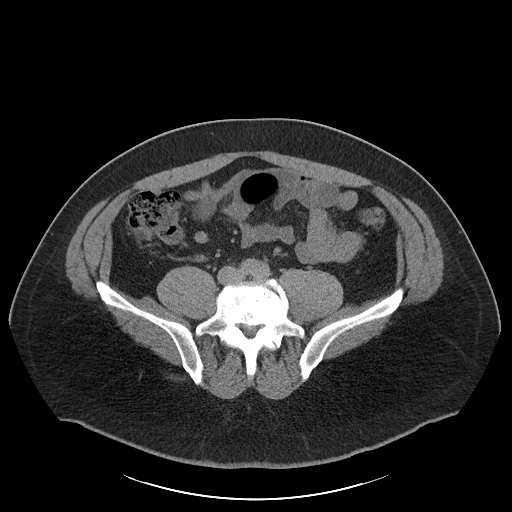
[im 48/105  soft-tissue]
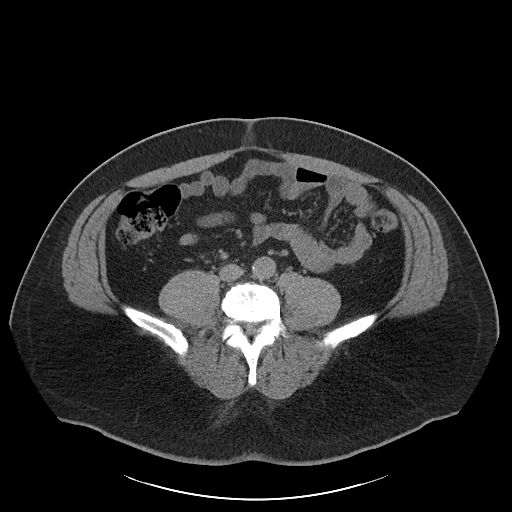
[im 57/105  soft-tissue]
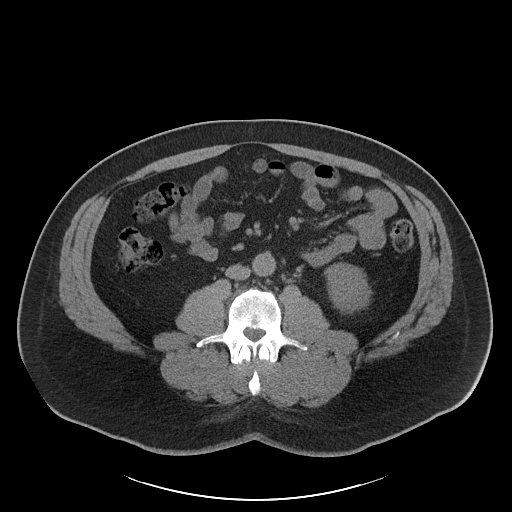
[im 61/105  soft-tissue]
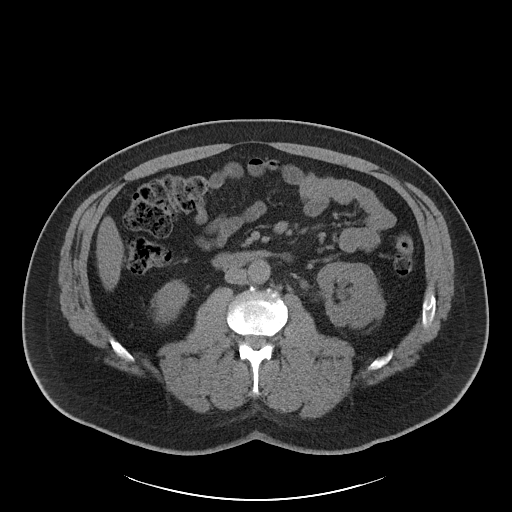
[im 61/105  bone]
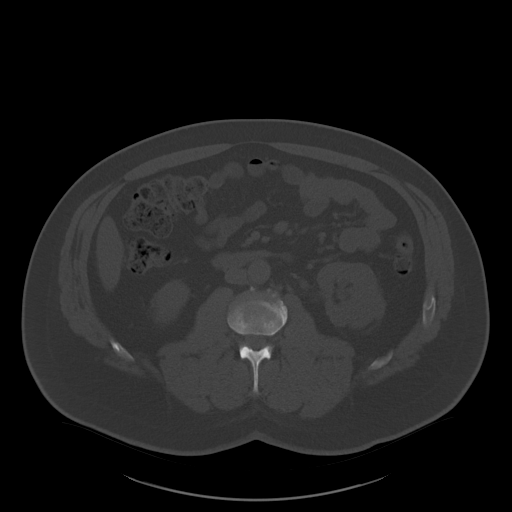
[im 70/105  soft-tissue]
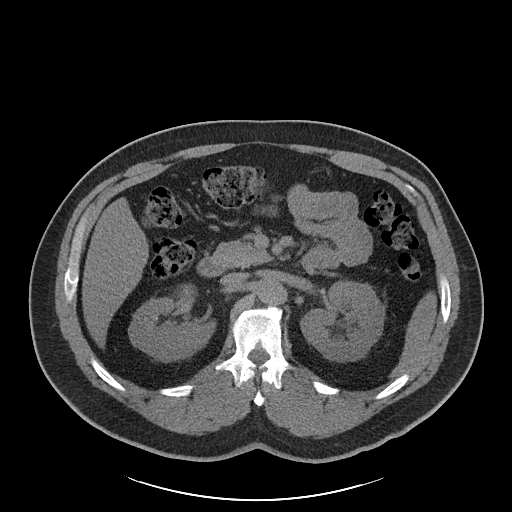
[im 79/105  soft-tissue]
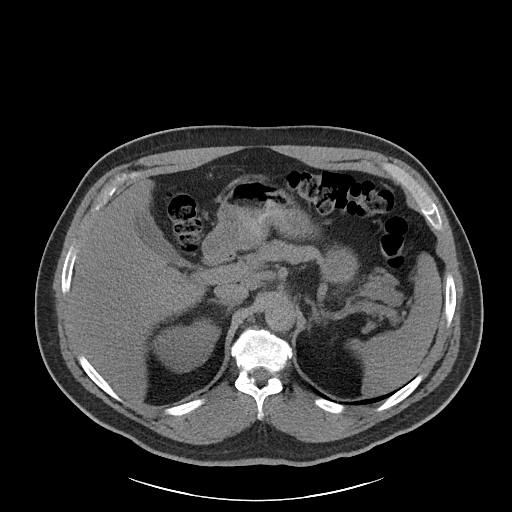
[im 83/105  soft-tissue]
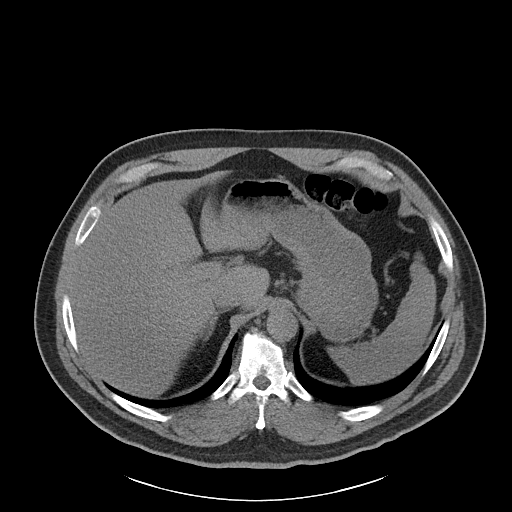
[im 92/105  soft-tissue]
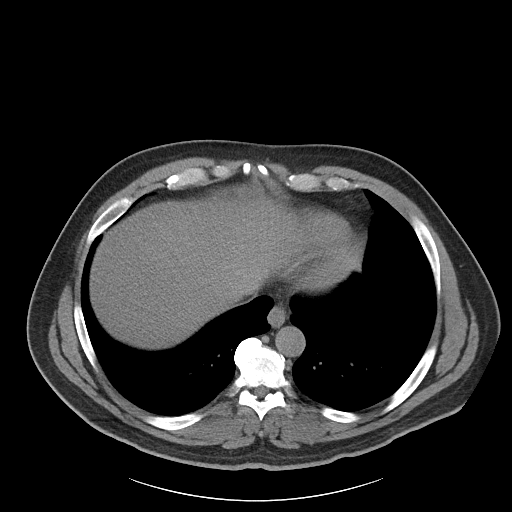
[im 100/105  soft-tissue]
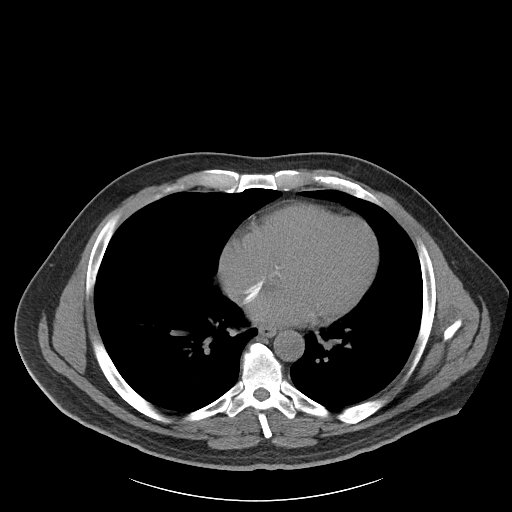

[Series 6: cor · coronal · 1.01mm/px · 3 of 171 slices shown]
[im 57/171  soft-tissue]
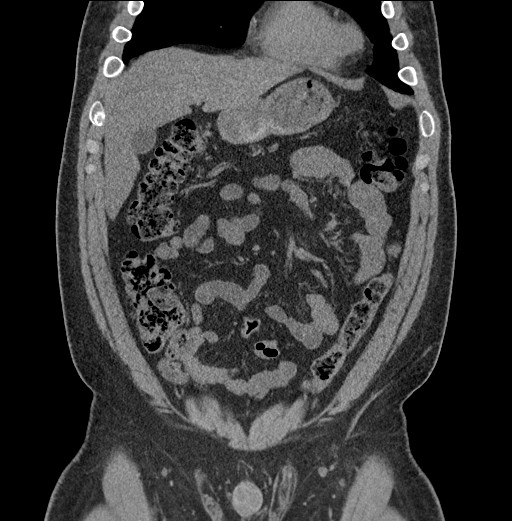
[im 76/171  soft-tissue]
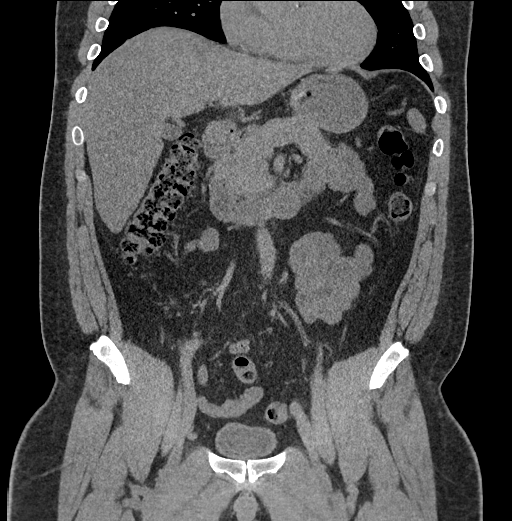
[im 95/171  soft-tissue]
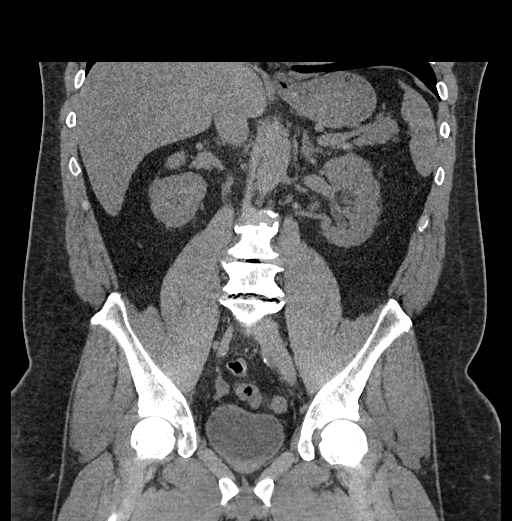

[17 of 46 positions shown; findings below may reference images not displayed]

FINDINGS: Lower chest: No acute abnormality.

Hepatobiliary: No focal liver abnormality is seen. No gallstones,
gallbladder wall thickening, or biliary dilatation.

Pancreas: Unremarkable. No pancreatic ductal dilatation or
surrounding inflammatory changes.

Spleen: Normal in size without focal abnormality.

Adrenals/Urinary Tract: Adrenal glands are unremarkable. Kidneys are
normal in size without focal lesions. Numerous 2 mm nonobstructing
renal calculi are seen within both kidneys. A 5 mm obstructing renal
calculus is seen within the proximal left ureter, with mild
left-sided hydronephrosis, hydroureter and perinephric inflammatory
fat stranding. Bladder is unremarkable.

Stomach/Bowel: Stomach is within normal limits. Appendix appears
normal. No evidence of bowel wall thickening, distention, or
inflammatory changes.

Vascular/Lymphatic: No significant vascular findings are present. No
enlarged abdominal or pelvic lymph nodes.

Reproductive: Prostate is unremarkable.

Other: No abdominal wall hernia or abnormality. No abdominopelvic
ascites.

Musculoskeletal: Marked severity degenerative changes are seen at
the levels of L3-L4 and L4-L5.
IMPRESSION: 1. 5 mm obstructing renal calculus within the proximal left ureter.
2. Numerous bilateral 2 mm nonobstructing renal calculi.

## 2021-05-05 MED ORDER — HYDROMORPHONE HCL 1 MG/ML IJ SOLN
1.0000 mg | Freq: Once | INTRAMUSCULAR | Status: AC
  Administered 2021-05-05: 01:00:00 1 mg via INTRAVENOUS
  Filled 2021-05-05: qty 1

## 2021-05-05 MED ORDER — OXYCODONE-ACETAMINOPHEN 5-325 MG PO TABS
2.0000 | ORAL_TABLET | Freq: Once | ORAL | Status: AC
  Administered 2021-05-05: 05:00:00 2 via ORAL
  Filled 2021-05-05: qty 2

## 2021-05-05 MED ORDER — HYDROMORPHONE HCL 1 MG/ML IJ SOLN
1.0000 mg | Freq: Once | INTRAMUSCULAR | Status: AC
  Administered 2021-05-05: 04:00:00 1 mg via INTRAVENOUS
  Filled 2021-05-05: qty 1

## 2021-05-05 MED ORDER — OXYCODONE-ACETAMINOPHEN 5-325 MG PO TABS: 1.0000 | ORAL_TABLET | Freq: Four times a day (QID) | ORAL | 0 refills | Status: DC | PRN

## 2021-05-05 MED ORDER — ONDANSETRON HCL 4 MG/2ML IJ SOLN
4.0000 mg | Freq: Once | INTRAMUSCULAR | Status: AC
  Administered 2021-05-05: 01:00:00 4 mg via INTRAVENOUS
  Filled 2021-05-05: qty 2

## 2021-05-05 MED ORDER — TAMSULOSIN HCL 0.4 MG PO CAPS: 0.4000 mg | ORAL_CAPSULE | Freq: Two times a day (BID) | ORAL | 0 refills | Status: DC

## 2021-05-05 MED ORDER — ONDANSETRON 4 MG PO TBDP: 4.0000 mg | ORAL_TABLET | Freq: Three times a day (TID) | ORAL | 0 refills | Status: DC | PRN

## 2021-05-05 MED ORDER — KETOROLAC TROMETHAMINE 30 MG/ML IJ SOLN
30.0000 mg | Freq: Once | INTRAMUSCULAR | Status: AC
  Administered 2021-05-05: 04:00:00 30 mg via INTRAVENOUS
  Filled 2021-05-05: qty 1

## 2021-05-05 MED ORDER — ONDANSETRON HCL 4 MG/2ML IJ SOLN
4.0000 mg | Freq: Once | INTRAMUSCULAR | Status: AC
  Administered 2021-05-05: 04:00:00 4 mg via INTRAVENOUS
  Filled 2021-05-05: qty 2

## 2021-05-05 NOTE — ED Triage Notes (Signed)
Left flank pain started yesterday morning. Darker urine through the day. Woke up tonight, pain is worse, vomiting on the way here. Hx of kidney stones

## 2021-05-05 NOTE — ED Notes (Signed)
Patient educated about not driving or performing other critical tasks (such as operating heavy machinery, caring for infant/toddler/child) due to sedative nature of narcotic medications received while in the ED.  Pt/caregiver verbalized understanding.   

## 2021-05-05 NOTE — ED Provider Notes (Signed)
[facility] EMERGENCY DEPARTMENT Provider Note   CSN: [id number] Arrival date & time: 05/05/21  0028     History Chief Complaint  Patient presents with   Flank Pain    [name] is a 50 y.o. male.  Patient presents to the emergency department with a chief complaint of left-sided flank pain and back pain.  States that his symptoms first began yesterday.  They acutely worsened tonight while he was asleep.  He reports history of prior kidney stones, and states this feels similar.  States that the pain radiates into his groin.  He reports associated nausea.  Denies fevers or chills.  Reports mild improvement after Dilaudid in triage.  The history is provided by the patient. No language interpreter was used.      Past Medical History:  Diagnosis Date   Arthritis    neck   Carpal tunnel syndrome on both sides 08/2014   DDD (degenerative disc disease), lumbar    History of kidney stones    Hypertension    under control with med., has been on med. x 1 yr.   Lung nodule    noted on CTA in 2/22 // Chest CT WO contrast 6/22: LUL nodule resolved, ascending aorta 4.2 x 4.2 cm, cholelithiasis, R kidney stone   Sleep apnea    CPAP but does not use   Stroke (HCC) 11/16/2019   Thoracic aortic aneurysm    MRA 5/22: Ascending thoracic aorta aneurysm 41 mm    Patient Active Problem List   Diagnosis Date Noted   Thoracic aortic aneurysm    Transaminitis    PFO with atrial septal aneurysm 11/22/2019   Headache 11/22/2019   Cerebral edema (HCC) 11/22/2019   Chest pain 11/22/2019   Hyperlipidemia LDL goal <70 11/22/2019   OSA (obstructive sleep apnea) 11/22/2019   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) 11/22/2019   Vascular headache    Dyslipidemia    Hypertension    Intractable nausea and vomiting    Hypokalemia    Depression with anxiety    Ischemic stroke (HCC) - L PICA infarct embolic in setting of PFO s/p crani    Lumbar  spondylolysis 07/31/2019   Lumbar radiculopathy 04/07/2019   Right foot drop 04/07/2019   Backache 07/05/2017   DDD (degenerative disc disease), lumbar 07/05/2017   Spinal stenosis of lumbar region 07/05/2017    Past Surgical History:  Procedure Laterality Date   BUBBLE STUDY  11/21/2019   Procedure: BUBBLE STUDY;  Surgeon: [name], MD;  Location: [facility] ENDOSCOPY;  Service: Cardiovascular;;   CARPAL TUNNEL RELEASE Left 09/07/2014   Procedure: LEFT CARPAL TUNNEL RELEASE;  Surgeon: [name], MD;  Location: [facility];  Service: Orthopedics;  Laterality: Left;   CARPAL TUNNEL RELEASE Right 10/12/2014   Procedure: RIGHT CARPAL TUNNEL RELEASE;  Surgeon: [name], MD;  Location: [facility];  Service: Orthopedics;  Laterality: Right;   KNEE ARTHROSCOPY Left    PATENT FORAMEN OVALE(PFO) CLOSURE N/A 01/31/2020   Procedure: PATENT FORAMEN OVALE (PFO) CLOSURE;  Surgeon: [name], MD;  Location: [facility] INVASIVE CV LAB;  Service: Cardiovascular;  Laterality: N/A;   SUBOCCIPITAL CRANIECTOMY CERVICAL LAMINECTOMY N/A 11/17/2019   Procedure: SUBOCCIPITAL DECOMPRESSIVE CRANIECTOMY;  Surgeon: [name], MD;  Location: [facility] OR;  Service: Neurosurgery;  Laterality: N/A;   TEE WITHOUT CARDIOVERSION N/A 11/21/2019   Procedure: TRANSESOPHAGEAL ECHOCARDIOGRAM (TEE);  Surgeon: [name], MD;  Location: [facility] ENDOSCOPY;  Service: Cardiovascular;  Laterality: N/A;       Family History  Problem Relation Age of Onset   Heart attack Mother     Social History   Tobacco Use   Smoking status: Never   Smokeless tobacco: Never  Vaping Use   Vaping Use: Former  Substance Use Topics   Alcohol use: Not Currently    Comment: quit 2006   Drug use: No    Home Medications Prior to Admission medications   Medication Sig Start Date End Date Taking? Authorizing Provider  Alirocumab (PRALUENT) 150 MG/ML SOAJ Inject 150 mg into the skin every 14  (fourteen) days. 03/04/21   [name], MD  aspirin EC 81 MG EC tablet Take 1 tablet (81 mg total) by mouth daily. Swallow whole. Patient not taking: Reported on 02/26/2021 11/23/19   Layne Benton, NP  clonazePAM (KLONOPIN) 0.5 MG tablet Take 1 tablet (0.5 mg total) by mouth 2 (two) times daily as needed for anxiety (anxiety, sleep, dizziness). 10/15/20   Ihor Austin, NP  Docusate Calcium (STOOL SOFTENER PO) Take 1 tablet by mouth every other day. OTC Patient not taking: Reported on 02/26/2021    [provider]  losartan (COZAAR) 50 MG tablet Take 50 mg by mouth daily.    [provider]  meclizine (ANTIVERT) 25 MG tablet TAKE 1 TABLET(25 MG) BY MOUTH THREE TIMES DAILY AS NEEDED FOR DIZZINESS OR NAUSEA Patient not taking: Reported on 02/26/2021 07/23/20   Erick Colace, MD    Allergies    Patient has no known allergies.  Review of Systems   Review of Systems  All other systems reviewed and are negative.  Physical Exam Updated Vital Signs BP (!) 158/105    Pulse 92    Temp 98.3 F (36.8 C) (Oral)    Resp (!) 23    SpO2 98%   Physical Exam Vitals and nursing note reviewed.  Constitutional:      General: He is not in acute distress.    Appearance: He is well-developed.  HENT:     Head: Normocephalic and atraumatic.  Eyes:     Conjunctiva/sclera: Conjunctivae normal.  Cardiovascular:     Rate and Rhythm: Normal rate and regular rhythm.     Heart sounds: No murmur heard. Pulmonary:     Effort: Pulmonary effort is normal. No respiratory distress.     Breath sounds: Normal breath sounds.  Abdominal:     Palpations: Abdomen is soft.     Tenderness: There is no abdominal tenderness.  Genitourinary:    Comments: Normal testes and penis, no sign of torsion Musculoskeletal:        General: No swelling.     Cervical back: Neck supple.  Skin:    General: Skin is warm and dry.     Capillary Refill: Capillary refill takes less than 2 seconds.   Neurological:     Mental Status: He is alert.  Psychiatric:        Mood and Affect: Mood normal.    ED Results / Procedures / Treatments   Labs (all labs ordered are listed, but only abnormal results are displayed) Labs Reviewed  BASIC METABOLIC PANEL - Abnormal; Notable for the following components:      Result Value   Glucose, Bld 134 (*)    All other components within normal limits  CBC  URINALYSIS, ROUTINE W REFLEX MICROSCOPIC    EKG None  Radiology CT Renal Stone Study  Result Date: 05/05/2021 CLINICAL DATA:  Left flank  pain. EXAM: CT ABDOMEN AND PELVIS WITHOUT CONTRAST TECHNIQUE: Multidetector CT imaging of the abdomen and pelvis was performed following the standard protocol without IV contrast. COMPARISON:  April 16, 2020 FINDINGS: Lower chest: No acute abnormality. Hepatobiliary: No focal liver abnormality is seen. No gallstones, gallbladder wall thickening, or biliary dilatation. Pancreas: Unremarkable. No pancreatic ductal dilatation or surrounding inflammatory changes. Spleen: Normal in size without focal abnormality. Adrenals/Urinary Tract: Adrenal glands are unremarkable. Kidneys are normal in size without focal lesions. Numerous 2 mm nonobstructing renal calculi are seen within both kidneys. A 5 mm obstructing renal calculus is seen within the proximal left ureter, with mild left-sided hydronephrosis, hydroureter and perinephric inflammatory fat stranding. Bladder is unremarkable. Stomach/Bowel: Stomach is within normal limits. Appendix appears normal. No evidence of bowel wall thickening, distention, or inflammatory changes. Vascular/Lymphatic: No significant vascular findings are present. No enlarged abdominal or pelvic lymph nodes. Reproductive: Prostate is unremarkable. Other: No abdominal wall hernia or abnormality. No abdominopelvic ascites. Musculoskeletal: Marked severity degenerative changes are seen at the levels of L3-L4 and L4-L5. IMPRESSION: 1. 5 mm  obstructing renal calculus within the proximal left ureter. 2. Numerous bilateral 2 mm nonobstructing renal calculi. Electronically Signed   By: Aram Candela M.D.   On: 05/05/2021 01:22    Procedures Procedures   Medications Ordered in ED Medications  ketorolac (TORADOL) 30 MG/ML injection 30 mg (has no administration in time range)  HYDROmorphone (DILAUDID) injection 1 mg (has no administration in time range)  ondansetron (ZOFRAN) injection 4 mg (has no administration in time range)  HYDROmorphone (DILAUDID) injection 1 mg (1 mg Intravenous Given 05/05/21 0121)  ondansetron (ZOFRAN) injection 4 mg (4 mg Intravenous Given 05/05/21 0121)    ED Course  I have reviewed the triage vital signs and the nursing notes.  Pertinent labs & imaging results that were available during my care of the patient were reviewed by me and considered in my medical decision making (see chart for details).    MDM Rules/Calculators/A&P                           Patient here with left-sided flank pain.  Pain started yesterday, but acutely worsened tonight while patient was asleep.  Patient has had history of numerous prior kidney stones.  States that this feels similar.  Rates his pain as severe.  Patient reassessed, he feels significantly improved after pain medication.  Plan is to transition to oral Percocet and then discharged with urology follow-up.  Patient is agreeable with this plan.  Will reassess.  5:50 AM Reassessed again.  Still feeling improved.  Will discharge with urology followup.  Return precautions given.  Final Clinical Impression(s) / ED Diagnoses Final diagnoses:  Kidney stone    Rx / DC Orders ED Discharge Orders          Ordered    oxyCODONE-acetaminophen (PERCOCET) 5-325 MG tablet  Every 6 hours PRN        05/05/21 0549    ondansetron (ZOFRAN-ODT) 4 MG disintegrating tablet  Every 8 hours PRN        05/05/21 0549    tamsulosin (FLOMAX) 0.4 MG CAPS capsule  2 times daily         05/05/21 0549             Roxy Horseman, [name] 05/05/21 0551    Gilda Crease, MD 05/05/21 [phone]

## 2021-05-05 NOTE — ED Provider Notes (Signed)
Emergency Medicine Provider Triage Evaluation Note  [name] , a 50 y.o. male  was evaluated in triage.  Pt complains of left flank pain.  Mild yesterday, acutely worsening today.  Some N/V with it.  Hx of kidney stones with similar in the past.  Review of Systems  Positive: Flank pain, nausea, vomiting Negative: fever  Physical Exam  BP (!) 158/105    Pulse 92    Temp 98.3 F (36.8 C) (Oral)    Resp (!) 23    SpO2 98%  Gen:   Awake, no distress   Resp:  Normal effort  MSK:   Moves extremities without difficulty  Other:  Appears uncomfortable  Medical Decision Making  Medically screening exam initiated at 12:57 AM.  Appropriate orders placed.  [name] was informed that the remainder of the evaluation will be completed by another provider, this initial triage assessment does not replace that evaluation, and the importance of remaining in the ED until their evaluation is complete.  Left flank pain.  Suspected stone.  Labs, UA, CT renal stone study.   Garlon Hatchet, [name] 05/05/21 5427    Gilda Crease, MD 05/05/21 [phone]

## 2021-05-05 NOTE — ED Notes (Signed)
Pt says he was feeling better, but now the pain is returning. He urinated amber urine. Urinalysis sent

## 2021-05-05 NOTE — ED Notes (Signed)
Pt given water per PA to help him to urinate.

## 2021-05-26 ENCOUNTER — Telehealth: Payer: Self-pay | Admitting: Neurology

## 2021-05-26 NOTE — Telephone Encounter (Signed)
Called pt and LVM with Wife on 05/20/21. Wife stated she will have pt call back to schedule appt.  Called pt again since no callback was made. Got a hold of pt and was informed that he has not started using the new Cpap machine but once he does start he will call back to schedule Initial Cpap Visit. Please schedule between dates 06/17/21-08/14/21

## 2021-06-03 ENCOUNTER — Telehealth: Payer: Self-pay | Admitting: Cardiovascular Disease

## 2021-06-03 ENCOUNTER — Telehealth: Payer: Self-pay | Admitting: Physician Assistant

## 2021-06-03 NOTE — Telephone Encounter (Signed)
Ok. Thank you. Tereso Newcomer, [name]    06/03/2021 9:14 AM

## 2021-06-03 NOTE — Telephone Encounter (Signed)
PA submitted, waiting on response.

## 2021-06-03 NOTE — Telephone Encounter (Signed)
Currently awaiting questions on cmm  [name] (Key: [id number]) Will update pt once a determination is made Took the liberty of activating a copay cardand giving the information to the pharmacy.

## 2021-06-03 NOTE — Telephone Encounter (Signed)
°  Pt would like to let Lorin Picket know that he had to push back his blood work because he didn't start his cholesterol meds right away due to an issue with his insurance. He r/s his blood work next month

## 2021-06-03 NOTE — Telephone Encounter (Signed)
Pt c/o medication issue:  1. Name of Medication:  Alirocumab (PRALUENT) 150 MG/ML SOAJ  2. How are you currently taking this medication (dosage and times per day)?   3. Are you having a reaction (difficulty breathing--STAT)?   4. What is your medication issue?  Danford Bad, with [employer] states a PA for Praluent was sent to the office on 01/13 and she is requesting a status update. Please confirm whether received and assist.   Phone#: [phone]

## 2021-06-04 ENCOUNTER — Other Ambulatory Visit: Payer: 59

## 2021-06-04 NOTE — Telephone Encounter (Signed)
Called and spoke to pt's wife and stated that they were approved for praluent, copay card information provided and the pt's wife voiced understanding

## 2021-06-04 NOTE — Telephone Encounter (Signed)
Received fax that Praluent prior authorization is approved through 06/03/22.

## 2021-07-04 ENCOUNTER — Other Ambulatory Visit: Payer: Self-pay

## 2021-07-04 ENCOUNTER — Other Ambulatory Visit: Payer: 59 | Admitting: *Deleted

## 2021-07-04 DIAGNOSIS — E785 Hyperlipidemia, unspecified: Secondary | ICD-10-CM

## 2021-07-04 DIAGNOSIS — E782 Mixed hyperlipidemia: Secondary | ICD-10-CM

## 2021-07-04 LAB — HEPATIC FUNCTION PANEL
ALT: 79 IU/L — ABNORMAL HIGH (ref 0–44)
AST: 44 IU/L — ABNORMAL HIGH (ref 0–40)
Albumin: 4.6 g/dL (ref 4.0–5.0)
Alkaline Phosphatase: 101 IU/L (ref 44–121)
Bilirubin Total: 0.5 mg/dL (ref 0.0–1.2)
Bilirubin, Direct: 0.17 mg/dL (ref 0.00–0.40)
Total Protein: 7.3 g/dL (ref 6.0–8.5)

## 2021-07-04 LAB — LIPID PANEL
Chol/HDL Ratio: 4.1 ratio (ref 0.0–5.0)
Cholesterol, Total: 158 mg/dL (ref 100–199)
HDL: 39 mg/dL — ABNORMAL LOW (ref >39)
LDL Chol Calc (NIH): 102 mg/dL — ABNORMAL HIGH (ref 0–99)
Triglycerides: 89 mg/dL (ref 0–149)
VLDL Cholesterol Cal: 17 mg/dL (ref 5–40)

## 2021-08-06 ENCOUNTER — Other Ambulatory Visit: Payer: Self-pay

## 2021-08-06 ENCOUNTER — Encounter: Payer: Self-pay | Admitting: *Deleted

## 2021-08-06 ENCOUNTER — Emergency Department (HOSPITAL_COMMUNITY): Payer: 59

## 2021-08-06 ENCOUNTER — Emergency Department (HOSPITAL_COMMUNITY)
Admission: EM | Admit: 2021-08-06 | Discharge: 2021-08-07 | Disposition: A | Discharge status: Home / Self Care | Payer: 59 | Attending: Emergency Medicine | Admitting: Emergency Medicine

## 2021-08-06 ENCOUNTER — Encounter (HOSPITAL_COMMUNITY): Payer: Self-pay

## 2021-08-06 DIAGNOSIS — R109 Unspecified abdominal pain: Secondary | ICD-10-CM | POA: Diagnosis present

## 2021-08-06 DIAGNOSIS — Z7982 Long term (current) use of aspirin: Secondary | ICD-10-CM | POA: Insufficient documentation

## 2021-08-06 DIAGNOSIS — N201 Calculus of ureter: Secondary | ICD-10-CM | POA: Insufficient documentation

## 2021-08-06 LAB — CBC
HCT: 46.3 % (ref 39.0–52.0)
Hemoglobin: 16.2 g/dL (ref 13.0–17.0)
MCH: 32.3 pg (ref 26.0–34.0)
MCHC: 35 g/dL (ref 30.0–36.0)
MCV: 92.2 fL (ref 80.0–100.0)
Platelets: 220 10*3/uL (ref 150–400)
RBC: 5.02 MIL/uL (ref 4.22–5.81)
RDW: 12.5 % (ref 11.5–15.5)
WBC: 6.6 10*3/uL (ref 4.0–10.5)
nRBC: 0 % (ref 0.0–0.2)

## 2021-08-06 LAB — BASIC METABOLIC PANEL
Anion gap: 8 (ref 5–15)
BUN: 12 mg/dL (ref 6–20)
CO2: 21 mmol/L — ABNORMAL LOW (ref 22–32)
Calcium: 9.7 mg/dL (ref 8.9–10.3)
Chloride: 112 mmol/L — ABNORMAL HIGH (ref 98–111)
Creatinine, Ser: 1.04 mg/dL (ref 0.61–1.24)
GFR, Estimated: >60 mL/min (ref >60)
Glucose, Bld: 120 mg/dL — ABNORMAL HIGH (ref 70–99)
Potassium: 3.6 mmol/L (ref 3.5–5.1)
Sodium: 141 mmol/L (ref 135–145)

## 2021-08-06 LAB — URINALYSIS, ROUTINE W REFLEX MICROSCOPIC
Bacteria, UA: NONE SEEN
Bilirubin Urine: NEGATIVE
Glucose, UA: NEGATIVE mg/dL
Ketones, ur: NEGATIVE mg/dL
Leukocytes,Ua: NEGATIVE
Nitrite: NEGATIVE
Protein, ur: 30 mg/dL — AB
RBC / HPF: >50 RBC/hpf — ABNORMAL HIGH (ref 0–5)
Specific Gravity, Urine: 1.02 (ref 1.005–1.030)
pH: 6 (ref 5.0–8.0)

## 2021-08-06 IMAGING — CT CT RENAL STONE PROTOCOL
2 of 4 series · 16 of 46 positions shown, 18 images · non-contrast
Comparison: None.

CLINICAL DATA: Nephrolithiasis



[Series 3: ap without · axial · non-contrast · 0.94mm/px · z∈[+688,+1138]mm · 13 of 100 slices shown, 15 images]
[im 5/100  soft-tissue]
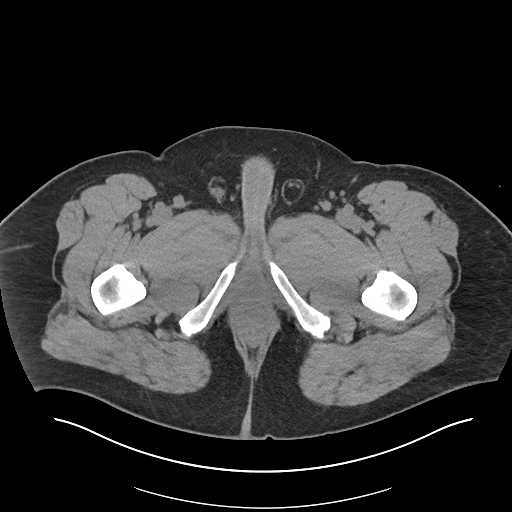
[im 5/100  bone]
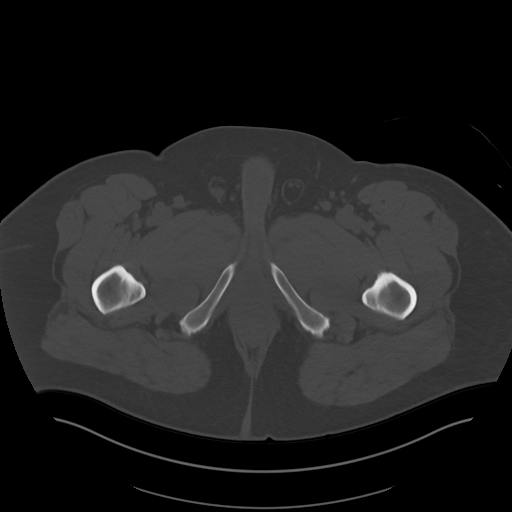
[im 15/100  soft-tissue]
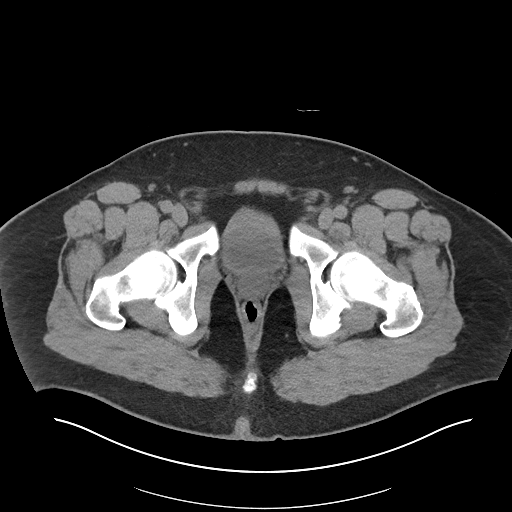
[im 20/100  soft-tissue]
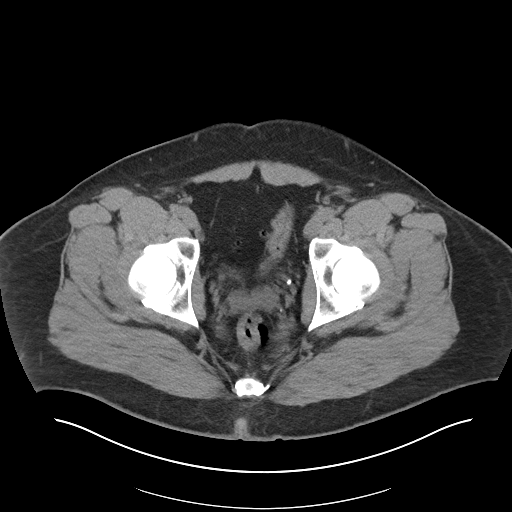
[im 30/100  soft-tissue]
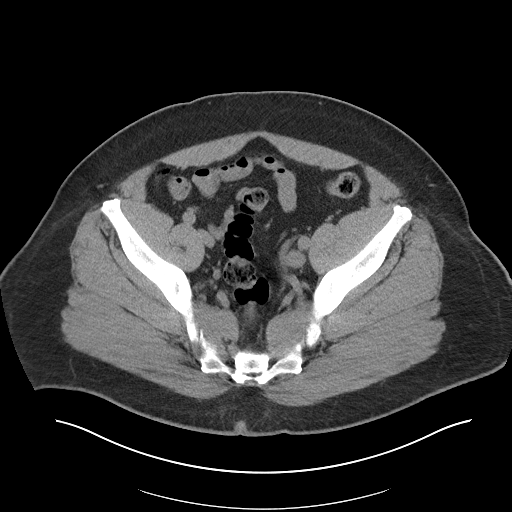
[im 35/100  soft-tissue]
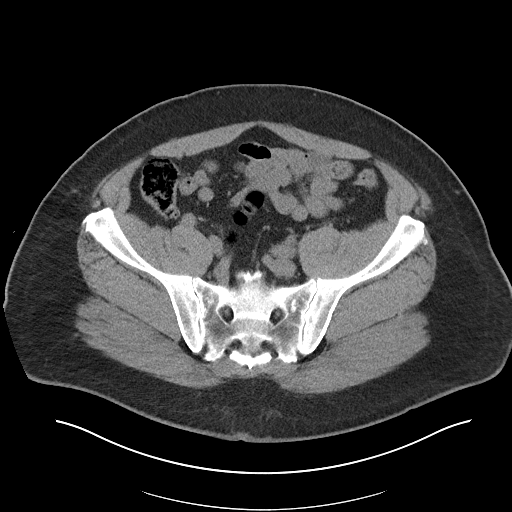
[im 45/100  soft-tissue]
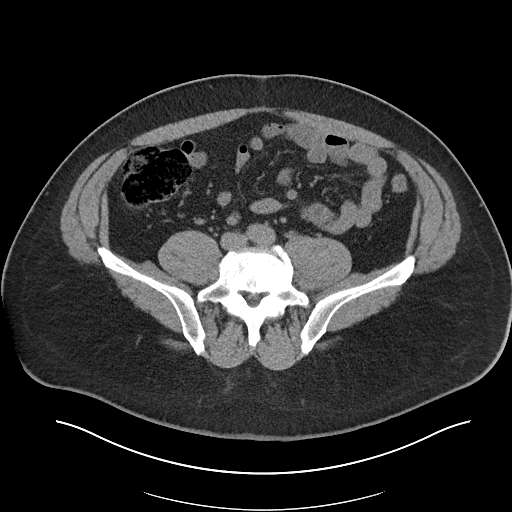
[im 50/100  soft-tissue]
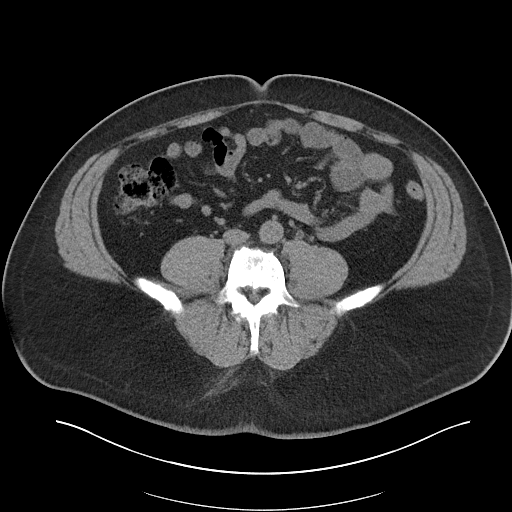
[im 55/100  soft-tissue]
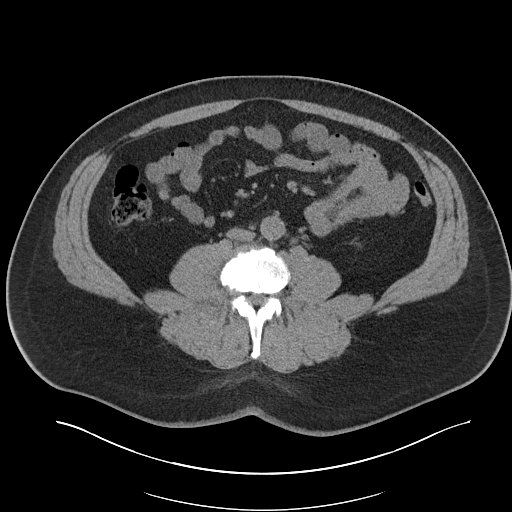
[im 65/100  soft-tissue]
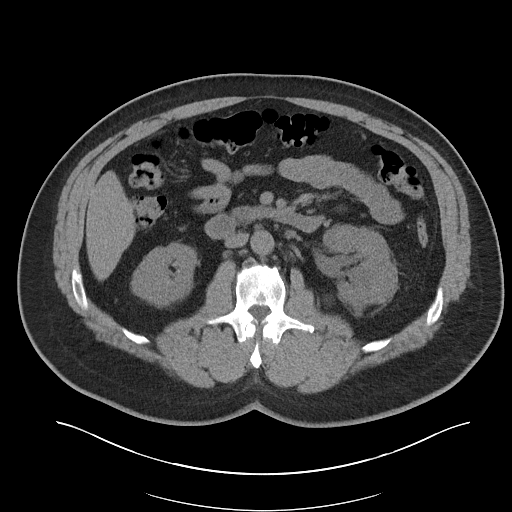
[im 65/100  bone]
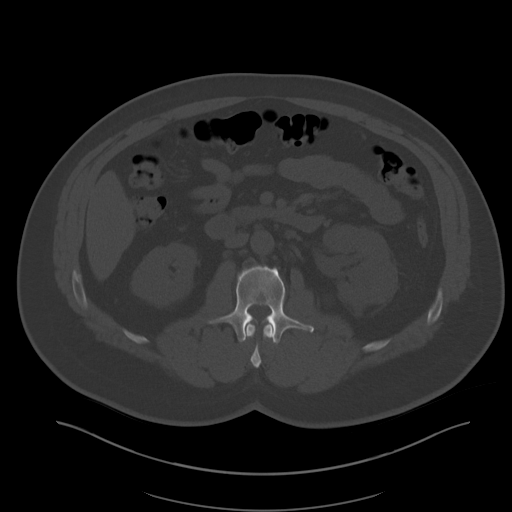
[im 70/100  soft-tissue]
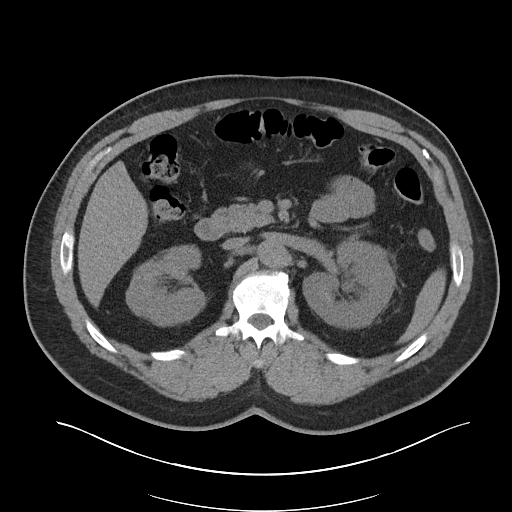
[im 80/100  soft-tissue]
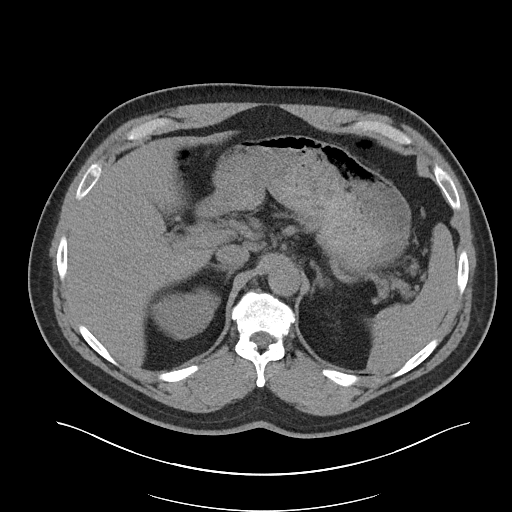
[im 85/100  soft-tissue]
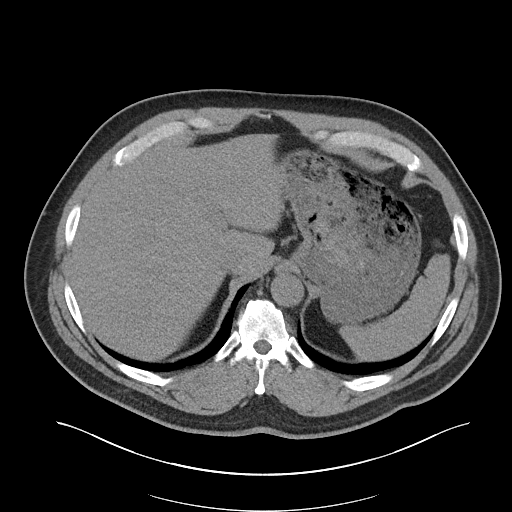
[im 95/100  soft-tissue]
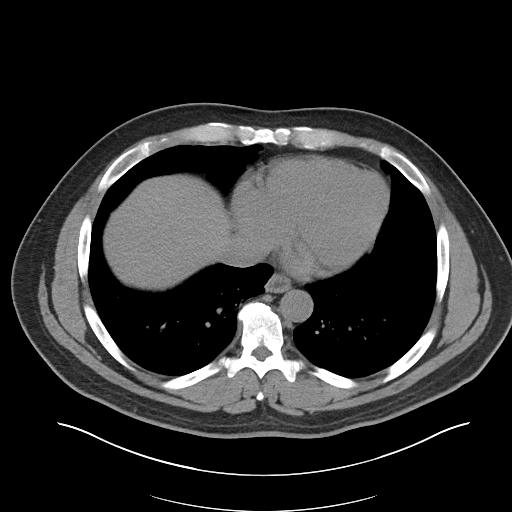

[Series 6: cor · coronal · 0.99mm/px · 3 of 117 slices shown]
[im 39/117  soft-tissue]
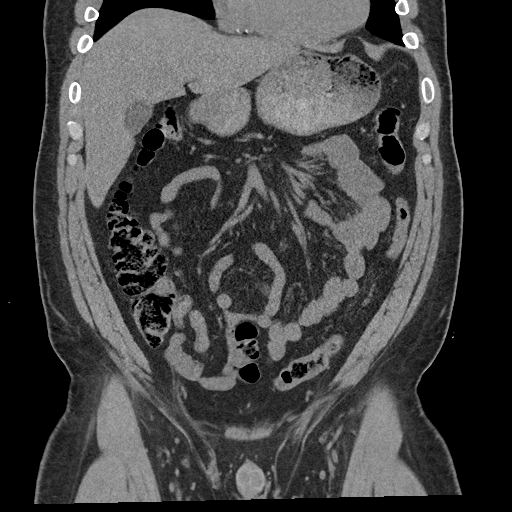
[im 52/117  soft-tissue]
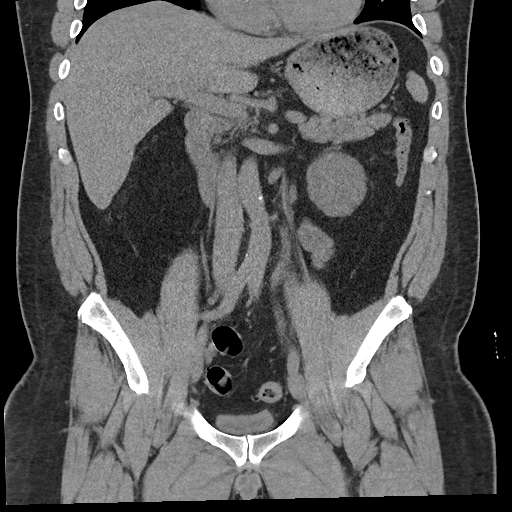
[im 65/117  soft-tissue]
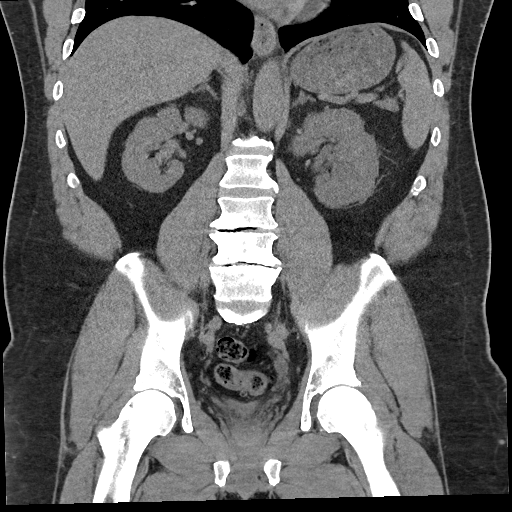

[16 of 46 positions shown; findings below may reference images not displayed]

FINDINGS: Lower chest: No acute abnormality.

Hepatobiliary: No focal liver abnormality. No gallstones,
gallbladder wall thickening, or pericholecystic fluid. No biliary
dilatation.

Pancreas: No focal lesion. Normal pancreatic contour. No surrounding
inflammatory changes. No main pancreatic ductal dilatation.

Spleen: Normal in size without focal abnormality.

Adrenals/Urinary Tract:

No adrenal nodule bilaterally.

Bilateral nephrolithiasis measuring up to 3 mm. There is a 4 mm left
ureterovesicular junction stone with associated proximal mild
hydroureteronephrosis. No right ureterolithiasis or
hydroureteronephrosis.

The urinary bladder is unremarkable.

Stomach/Bowel: Stomach is within normal limits. No evidence of bowel
wall thickening or dilatation. Appendix appears normal.

Vascular/Lymphatic: No abdominal aorta or iliac aneurysm. Mild
atherosclerotic plaque of the aorta and its branches. No abdominal,
pelvic, or inguinal lymphadenopathy.

Reproductive: Prostate is unremarkable.

Other: No intraperitoneal free fluid. No intraperitoneal free gas.
No organized fluid collection.

Musculoskeletal:

No abdominal wall hernia or abnormality.

No suspicious lytic or blastic osseous lesions. No acute displaced
fracture. Multilevel severe degenerative changes of the spine.
IMPRESSION: 1. Obstructive 4 mm left ureterovesicular junction stone.
2. Nonobstructive bilateral nephrolithiasis measuring up to 3 mm.
3.  Aortic Atherosclerosis ([BV]-[BV]).

## 2021-08-06 MED ORDER — LACTATED RINGERS IV SOLN: INTRAVENOUS | Status: DC

## 2021-08-06 MED ORDER — ONDANSETRON 4 MG PO TBDP
4.0000 mg | ORAL_TABLET | Freq: Once | ORAL | Status: AC
  Administered 2021-08-06: 4 mg via ORAL
  Filled 2021-08-06: qty 1

## 2021-08-06 MED ORDER — KETOROLAC TROMETHAMINE 15 MG/ML IJ SOLN
15.0000 mg | Freq: Once | INTRAMUSCULAR | Status: AC
Start: 2021-08-06 — End: 2021-08-06
  Administered 2021-08-06: 15 mg via INTRAVENOUS
  Filled 2021-08-06: qty 1

## 2021-08-06 MED ORDER — HYDROMORPHONE HCL 1 MG/ML IJ SOLN
1.0000 mg | Freq: Once | INTRAMUSCULAR | Status: AC
  Administered 2021-08-06: 1 mg via INTRAMUSCULAR
  Filled 2021-08-06: qty 1

## 2021-08-06 MED ORDER — OXYCODONE-ACETAMINOPHEN 5-325 MG PO TABS
1.0000 | ORAL_TABLET | Freq: Once | ORAL | Status: AC
  Administered 2021-08-06: 1 via ORAL
  Filled 2021-08-06: qty 1

## 2021-08-06 NOTE — ED Provider Triage Note (Signed)
Emergency Medicine Provider Triage Evaluation Note ? ?Manuela Schwartz , a 51 y.o. male  was evaluated in triage.  Pt complains of left flank pain starting at 12 PM today.  Patient states he radiates to his groin.  He does have a history of kidney stones. ? ?Review of Systems  ?Positive: Flank pain ?Negative: Dysuria, hematuria, fever ? ?Physical Exam  ?BP (!) 157/139   Pulse 95   Temp 97.9 ?F (36.6 ?C) (Oral)   Resp (!) 22   SpO2 100%  ?Gen:   Awake, no distress   ?Resp:  Normal effort  ?MSK:   Moves extremities without difficulty  ?Other:  Patient appears very uncomfortable ? ?Medical Decision Making  ?Medically screening exam initiated at 8:40 PM.  Appropriate orders placed.  ELIN SEATS was informed that the remainder of the evaluation will be completed by another provider, this initial triage assessment does not replace that evaluation, and the importance of remaining in the ED until their evaluation is complete. ? ? ?  ?Su Monks, [name] ?08/06/21 2041 ? ?

## 2021-08-06 NOTE — ED Triage Notes (Signed)
Pt reports left flank pain that radiates to his groin, onset today at 12pm. Hx of kidney stones. Denies dysuria/hematuria. ?

## 2021-08-06 NOTE — ED Provider Notes (Signed)
?[facility] EMERGENCY DEPARTMENT ?Provider Note ? ? ?CSN: [id number] ?Arrival date & time: 08/06/21  2030 ? ?  ? ?History ? ?Chief Complaint  ?Patient presents with  ? Flank Pain  ? ? ?[name] is a 51 y.o. male who presents today for evaluation of left-sided flank pain.  He has an extensive history of kidney stones and states that this feels similar.  He states that he initially started having some mild pain in his back over the weekend however today at about noon his pain became much worse.  He denies any fevers.  No dysuria or hematuria.  He does not take any anticoagulants. ? ?HPI ? ?  ? ?Home Medications ?Prior to Admission medications   ?Medication Sig Start Date End Date Taking? Authorizing Provider  ?Alirocumab (PRALUENT) 150 MG/ML SOAJ Inject 150 mg into the skin every 14 (fourteen) days. 03/04/21  Yes [name], MD  ?ibuprofen (ADVIL) 200 MG tablet Take 200 mg by mouth every 6 (six) hours as needed for mild pain.   Yes [provider]  ?losartan (COZAAR) 50 MG tablet Take 50 mg by mouth daily.   Yes [provider]  ?oxyCODONE-acetaminophen (PERCOCET/ROXICET) 5-325 MG tablet Take 1 tablet by mouth every 6 (six) hours as needed for severe pain. 08/07/21  Yes Cristina Gong, [name]  ?tamsulosin (FLOMAX) 0.4 MG CAPS capsule Take 1 capsule (0.4 mg total) by mouth daily as needed (kidney stone). 08/07/21  Yes Cristina Gong, [name]  ?aspirin EC 81 MG EC tablet Take 1 tablet (81 mg total) by mouth daily. Swallow whole. ?Patient not taking: Reported on 02/26/2021 11/23/19   Layne Benton, NP  ?clonazePAM (KLONOPIN) 0.5 MG tablet Take 1 tablet (0.5 mg total) by mouth 2 (two) times daily as needed for anxiety (anxiety, sleep, dizziness). ?Patient not taking: Reported on 08/06/2021 10/15/20   Ihor Austin, NP  ?meclizine (ANTIVERT) 25 MG tablet TAKE 1 TABLET(25 MG) BY MOUTH THREE TIMES DAILY AS NEEDED FOR DIZZINESS OR NAUSEA ?Patient not taking: Reported on  02/26/2021 07/23/20   Erick Colace, MD  ?ondansetron (ZOFRAN-ODT) 4 MG disintegrating tablet Take 1 tablet (4 mg total) by mouth every 8 (eight) hours as needed for nausea or vomiting. ?Patient not taking: Reported on 08/06/2021 05/05/21   Roxy Horseman, [name]  ?   ? ?Allergies    ?Patient has no known allergies.   ? ?Review of Systems   ?Review of Systems ? ?Physical Exam ?Updated Vital Signs ?BP (!) 135/97   Pulse 78   Temp 98 ?F (36.7 ?C) (Oral)   Resp 10   Ht 6\' 4"  (1.93 m)   SpO2 94%   BMI 41.36 kg/m?  ?Physical Exam ?Vitals and nursing note reviewed.  ?Constitutional:   ?   General: He is not in acute distress. ?   Appearance: He is not diaphoretic.  ?   Comments: Appears uncomfortable  ?HENT:  ?   Head: Normocephalic and atraumatic.  ?Eyes:  ?   General: No scleral icterus.    ?   Right eye: No discharge.     ?   Left eye: No discharge.  ?   Conjunctiva/sclera: Conjunctivae normal.  ?Cardiovascular:  ?   Rate and Rhythm: Normal rate and regular rhythm.  ?   Pulses: Normal pulses.  ?Pulmonary:  ?   Effort: Pulmonary effort is normal. No respiratory distress.  ?   Breath sounds: Normal breath sounds. No stridor.  ?Abdominal:  ?  General: There is no distension.  ?   Tenderness: There is no abdominal tenderness. There is left CVA tenderness.  ?Musculoskeletal:     ?   General: No deformity.  ?   Cervical back: Normal range of motion and neck supple.  ?Skin: ?   General: Skin is warm and dry.  ?Neurological:  ?   Mental Status: He is alert.  ?   Motor: No abnormal muscle tone.  ?Psychiatric:     ?   Behavior: Behavior normal.  ? ? ?ED Results / Procedures / Treatments   ?Labs ?(all labs ordered are listed, but only abnormal results are displayed) ?Labs Reviewed  ?URINALYSIS, ROUTINE W REFLEX MICROSCOPIC - Abnormal; Notable for the following components:  ?    Result Value  ? APPearance HAZY (*)   ? Hgb urine dipstick LARGE (*)   ? Protein, ur 30 (*)   ? RBC / HPF >50 (*)   ? All other components  within normal limits  ?BASIC METABOLIC PANEL - Abnormal; Notable for the following components:  ? Chloride 112 (*)   ? CO2 21 (*)   ? Glucose, Bld 120 (*)   ? All other components within normal limits  ?CBC  ? ? ?EKG ?EKG Interpretation ? ?Date/Time:  Thursday August 07 2021 01:50:51 EDT ?Ventricular Rate:  83 ?PR Interval:  192 ?QRS Duration: 108 ?QT Interval:  366 ?QTC Calculation: 430 ?R Axis:   93 ?Text Interpretation: Sinus arrhythmia Borderline right axis deviation Abnormal R-wave progression, late transition Borderline T abnormalities, inferior leads ST elev, probable normal early repol pattern Confirmed by Geoffery LyonseLo, [place] ([id number]) on 08/07/2021 1:57:13 AM ? ?Radiology ?CT Renal Stone Study ? ?Result Date: 08/06/2021 ?CLINICAL DATA:  Nephrolithiasis EXAM: CT ABDOMEN AND PELVIS WITHOUT CONTRAST TECHNIQUE: Multidetector CT imaging of the abdomen and pelvis was performed following the standard protocol without IV contrast. RADIATION DOSE REDUCTION: This exam was performed according to the departmental dose-optimization program which includes automated exposure control, adjustment of the mA and/or kV according to patient size and/or use of iterative reconstruction technique. COMPARISON:  None. FINDINGS: Lower chest: No acute abnormality. Hepatobiliary: No focal liver abnormality. No gallstones, gallbladder wall thickening, or pericholecystic fluid. No biliary dilatation. Pancreas: No focal lesion. Normal pancreatic contour. No surrounding inflammatory changes. No main pancreatic ductal dilatation. Spleen: Normal in size without focal abnormality. Adrenals/Urinary Tract: No adrenal nodule bilaterally. Bilateral nephrolithiasis measuring up to 3 mm. There is a 4 mm left ureterovesicular junction stone with associated proximal mild hydroureteronephrosis. No right ureterolithiasis or hydroureteronephrosis. The urinary bladder is unremarkable. Stomach/Bowel: Stomach is within normal limits. No evidence of bowel wall  thickening or dilatation. Appendix appears normal. Vascular/Lymphatic: No abdominal aorta or iliac aneurysm. Mild atherosclerotic plaque of the aorta and its branches. No abdominal, pelvic, or inguinal lymphadenopathy. Reproductive: Prostate is unremarkable. Other: No intraperitoneal free fluid. No intraperitoneal free gas. No organized fluid collection. Musculoskeletal: No abdominal wall hernia or abnormality. No suspicious lytic or blastic osseous lesions. No acute displaced fracture. Multilevel severe degenerative changes of the spine. IMPRESSION: 1. Obstructive 4 mm left ureterovesicular junction stone. 2. Nonobstructive bilateral nephrolithiasis measuring up to 3 mm. 3.  Aortic Atherosclerosis (ICD10-I70.0). Electronically Signed   By: Tish FredericksonMorgane  Naveau M.D.   On: 08/06/2021 22:06   ? ?Procedures ?Procedures  ? ? ?Medications Ordered in ED ?Medications  ?lactated ringers infusion (0 mLs Intravenous Stopped 08/07/21 0251)  ?oxyCODONE-acetaminophen (PERCOCET/ROXICET) 5-325 MG per tablet 1 tablet (1 tablet Oral Given 08/06/21 2103)  ?ondansetron (  ZOFRAN-ODT) disintegrating tablet 4 mg (4 mg Oral Given 08/06/21 2103)  ?HYDROmorphone (DILAUDID) injection 1 mg (1 mg Intramuscular Given 08/06/21 2118)  ?ketorolac (TORADOL) 15 MG/ML injection 15 mg (15 mg Intravenous Given 08/06/21 2342)  ?morphine (PF) 4 MG/ML injection 4 mg (4 mg Intravenous Given 08/07/21 0020)  ?oxyCODONE-acetaminophen (PERCOCET/ROXICET) 5-325 MG per tablet 1 tablet (1 tablet Oral Given 08/07/21 0248)  ? ? ?ED Course/ Medical Decision Making/ A&P ?Clinical Course as of 08/07/21 0400  ?Thu Aug 07, 2021  ?0002 Patient is reevaluated.  He got Toradol about 20 minutes ago.  He states that he is still in significant pain.  Will order IV morphine.  He did have IM Dilaudid in triage which he feels like did not help much. ?He is also borderline hypoxic at 91% on room air when I walk him.  He states that he has a history of sleep apnea and wears a CPAP usually.   This improved while I was in the room and he got up into the mid 90s.   [EH]  ?0128 Patient is reevaluated, his pain is better controlled at this time.   [EH]  ?9379 Patient is reevaluated, he is maintaining his saturations off

## 2021-08-07 ENCOUNTER — Encounter (HOSPITAL_COMMUNITY): Payer: Self-pay

## 2021-08-07 MED ORDER — MORPHINE SULFATE (PF) 4 MG/ML IV SOLN
4.0000 mg | Freq: Once | INTRAVENOUS | Status: AC
  Administered 2021-08-07: 4 mg via INTRAVENOUS
  Filled 2021-08-07: qty 1

## 2021-08-07 MED ORDER — TAMSULOSIN HCL 0.4 MG PO CAPS: 0.4000 mg | ORAL_CAPSULE | Freq: Every day | ORAL | 0 refills | Status: DC | PRN

## 2021-08-07 MED ORDER — OXYCODONE-ACETAMINOPHEN 5-325 MG PO TABS: 1.0000 | ORAL_TABLET | Freq: Four times a day (QID) | ORAL | 0 refills | Status: DC | PRN

## 2021-08-07 MED ORDER — OXYCODONE-ACETAMINOPHEN 5-325 MG PO TABS
1.0000 | ORAL_TABLET | Freq: Once | ORAL | Status: AC
  Administered 2021-08-07: 1 via ORAL
  Filled 2021-08-07: qty 1

## 2021-08-07 NOTE — ED Notes (Signed)
Patient verbalizes understanding of d/c instructions. Opportunities for questions and answers were provided. Pt d/c from ED and ambulated to lobby where wife is picking pt up. ?

## 2021-08-07 NOTE — ED Notes (Signed)
Pt placed on 2L [place] for comfort.

## 2021-08-07 NOTE — Discharge Instructions (Addendum)
Please take Ibuprofen (Advil, motrin) and Tylenol (acetaminophen) to relieve your pain.    You may take up to 600 MG (3 pills) of normal strength ibuprofen every 8 hours as needed.   You make take tylenol, up to 1,000 mg (two extra strength pills) every 8 hours as needed.   It is safe to take ibuprofen and tylenol at the same time as they work differently.   Do not take more than 3,000 mg tylenol in a 24 hour period (not more than one dose every 8 hours.  Please check all medication labels as many medications such as pain and cold medications may contain tylenol.  Do not drink alcohol while taking these medications.  Do not take other NSAID'S while taking ibuprofen (such as aleve or naproxen).  Please take ibuprofen with food to decrease stomach upset.  Today you received medications that may make you sleepy or impair your ability to make decisions.  For the next 24 hours please do not drive, operate heavy machinery, care for a small child with out another adult present, or perform any activities that may cause harm to you or someone else if you were to fall asleep or be impaired.   You are being prescribed a medication which may make you sleepy. Please follow up of listed precautions for at least 24 hours after taking one dose.  

## 2021-09-16 ENCOUNTER — Telehealth: Payer: Self-pay | Admitting: *Deleted

## 2021-09-16 DIAGNOSIS — I77810 Thoracic aortic ectasia: Secondary | ICD-10-CM

## 2021-09-16 NOTE — Telephone Encounter (Signed)
-----   Message from Beatrice Lecher, [place] sent at 09/16/2021  1:28 PM EDT ----- ?Please make sure Chest MRA arranged for May 2023. ?See result note for Chest MRA in May 2022.  ?Dx: dilated ascending aorta ?I don't see an order. ?Thanks! ?Scott  ? ?

## 2021-09-23 ENCOUNTER — Ambulatory Visit (HOSPITAL_COMMUNITY)
Admission: RE | Admit: 2021-09-23 | Discharge: 2021-09-23 | Disposition: A | Discharge status: Home / Self Care | Payer: 59 | Source: Ambulatory Visit | Attending: Physician Assistant | Admitting: Physician Assistant

## 2021-09-23 DIAGNOSIS — I77810 Thoracic aortic ectasia: Secondary | ICD-10-CM | POA: Diagnosis present

## 2021-09-23 IMAGING — MR MR MRA CHEST W/ OR W/O CM
14 series · 16 of 16 positions shown · IV contrast (10 GADAVIST)
Comparison: MRA of the chest-[DATE]

CLINICAL DATA: Follow-up dilatation of the ascending thoracic
aorta.

EXAM:
MRA CHEST WITH OR WITHOUT CONTRAST
TECHNIQUE: Angiographic images of the chest were obtained using MRA technique
with intravenous contrast.
CONTRAST:  10mL GADAVIST GADOBUTROL 1 MMOL/ML IV SOLN

[Series 2: cor_trufi_fb · coronal · 4.5mm · 0.78mm/px · 1 of 128 slices shown]
[im 1/128]
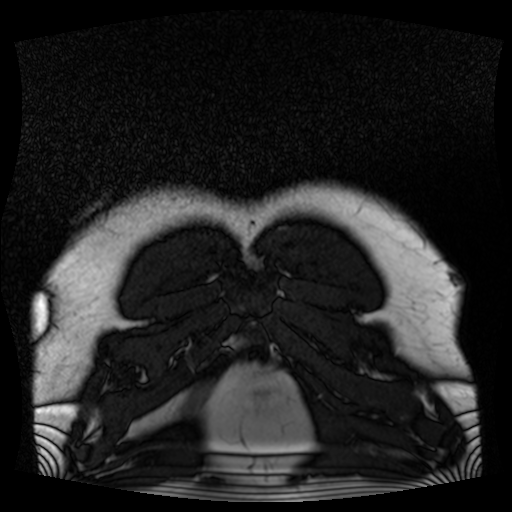

[Series 3: cor haste · coronal · 8.0mm · 1.25mm/px · 1 of 36 slices shown]
[im 1/36]
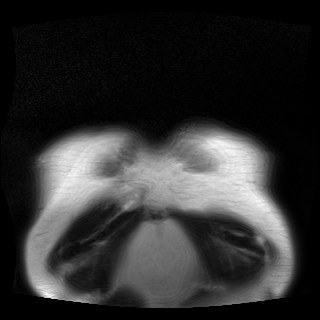

[Series 4: ax_trufi_trig · axial · 8.0mm · 1.48mm/px · 1 of 30 slices shown]
[im 1/30]
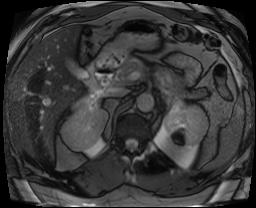

[Series 5: ax _haste_trig · axial · 8.0mm · 1.48mm/px · 1 of 30 slices shown]
[im 1/30]
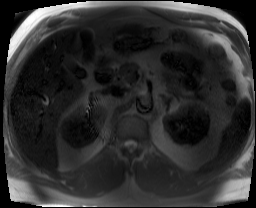

[Series 6: T1 dynamic · axial · 3.0mm · 1.25mm/px · 1 of 80 slices shown (1 of 8)]
[im 1/80]
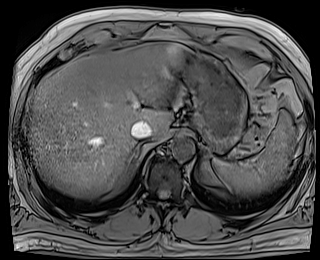

[Series 7: T1 dynamic · axial · 3.0mm · 1.25mm/px · 1 of 80 slices shown (2 of 8)]
[im 1/80]
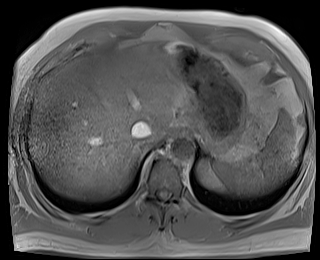

[Series 9: T1 dynamic · axial · 3.0mm · 1.25mm/px · 1 of 80 slices shown (3 of 8)]
[im 1/80]
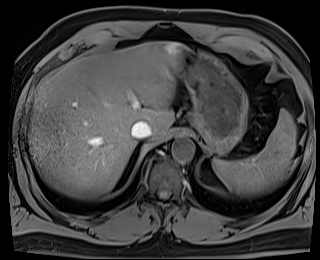

[Series 12: angio_fl3d_parasag_p3_post · sagittal · 1.2mm · 0.99mm/px · 2 of 104 slices shown]
[im 1/104]
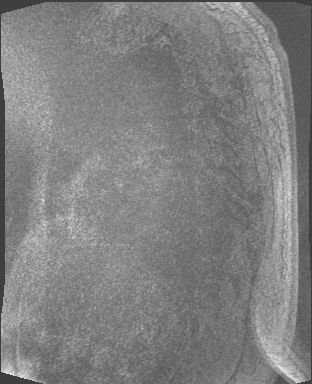
[im 104/104]
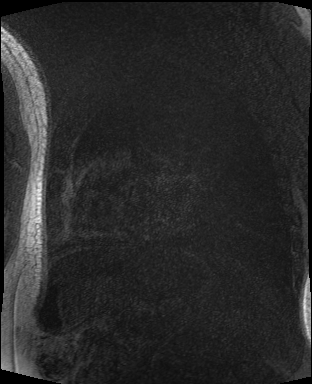

[Series 13: angio_fl3d_parasag_p3_post_sub · sagittal · 1.2mm · 0.99mm/px · 2 of 104 slices shown]
[im 1/104]
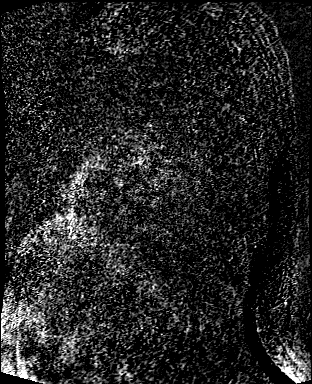
[im 104/104]
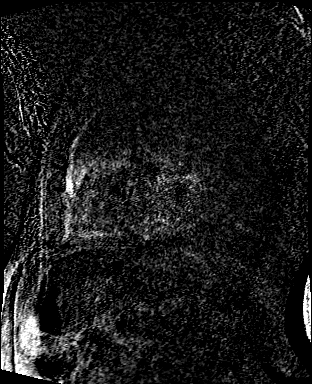

[Series 15: T1 dynamic · axial · 3.0mm · 1.25mm/px · 1 of 80 slices shown (4 of 8)]
[im 1/80]
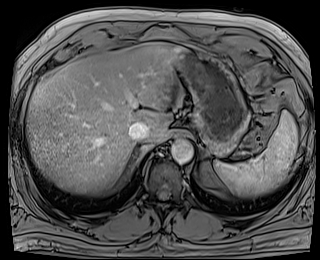

[Series 16: T1 dynamic · axial · 3.0mm · 1.25mm/px · 1 of 80 slices shown (5 of 8)]
[im 1/80]
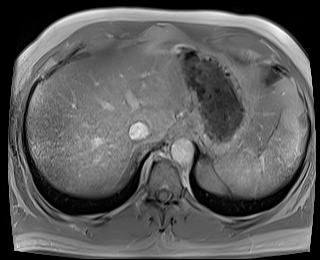

[Series 17: T1 dynamic · axial · 3.0mm · 1.25mm/px · 1 of 80 slices shown (6 of 8)]
[im 1/80]
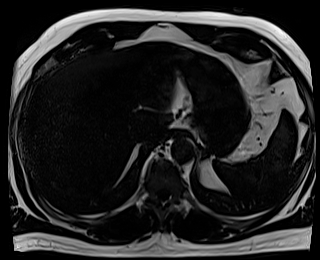

[Series 18: T1 dynamic · axial · 3.0mm · 1.25mm/px · 1 of 80 slices shown (7 of 8)]
[im 1/80]
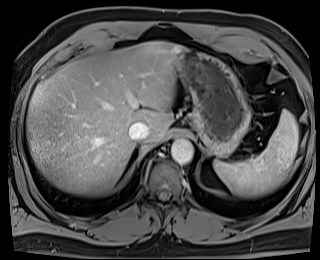

[Series 20: T1 dynamic · coronal · 3.0mm · 1.25mm/px · 1 of 72 slices shown (8 of 8)]
[im 1/72]
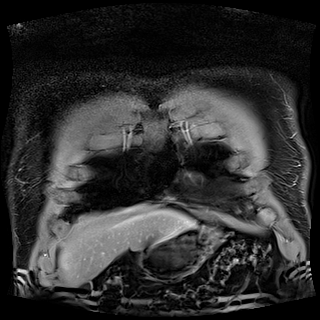

[16 of 16 positions shown; findings below may reference images not displayed]

FINDINGS: Vascular Findings:

Increased aneurysmal dilatation of the ascending thoracic aorta with
measurements as follows. No evidence of thoracic aortic dissection
or perivascular stranding.

Conventional configuration of the aortic arch. The branch vessels of
the aortic arch appear widely patent throughout their imaged
courses.

Borderline cardiomegaly. No pericardial effusion. Sequela of
previous percutaneous PFO closure.

Although this examination was not tailored for the evaluation the
pulmonary arteries, there are no discrete filling defects within the
central pulmonary arterial tree to suggest central pulmonary
embolism. Normal caliber of the main pulmonary artery.

-------------------------------------------------------------

Thoracic aortic measurements:

SINOTUBULAR JUNCTION: 42 mm as measured in greatest oblique short
axis sagittal dimension (image 61, series 13).

PROXIMAL ASCENDING THORACIC AORTA: 46 mm as measured in greatest
oblique short axis axial dimension (image 19, series 4) at the level
of the main pulmonary artery and approximately 45 mm as measured in
greatest oblique short axis sagittal dimension (sagittal image 49,
series 13), previously, 41 mm and 42 mm in diameter by my direct
remeasurement.

AORTIC ARCH: 33 mm as measured in greatest oblique short axis
sagittal dimension.

PROXIMAL DESCENDING THORACIC AORTA: 30 mm as measured in greatest
oblique short axis axial dimension at the level of the main
pulmonary artery.

DISTAL DESCENDING THORACIC AORTA: 29 mm as measured in greatest
oblique short axis axial dimension at the level of the diaphragmatic
hiatus.

Review of the MIP images confirms the above findings.

-------------------------------------------------------------

Non-Vascular Findings:

Mediastinum/Lymph Nodes: No bulky mediastinal, hilar or axillary
lymphadenopathy.

Lungs/Pleura: No discrete focal airspace opacities. No pleural
effusion.

Upper abdomen: Limited evaluation of the upper abdomen demonstrates
two subcentimeter nonenhancing T2 intense lesions within the left
kidney (coronal image 47, series 20; image 22, series 3)), too small
to accurately characterize though likely representative of renal
cysts.

Musculoskeletal: No discrete acute or aggressive osseous lesions.
Stigmata of dish throughout the thoracic spine.
IMPRESSION: 1. Interval increase in uncomplicated fusiform aneurysmal dilatation
of the ascending thoracic aorta now measuring 45-46 mm in diameter,
previously, 41-42 mm (when compared to the [DATE] examination).
Ascending thoracic aortic aneurysm. Recommend semi-annual imaging
followup by CTA or MRA and referral to cardiothoracic surgery if not
already obtained. This recommendation follows [YW]
ACCF/AHA/AATS/ACR/ASA/SCA/IONUC/IONUC/IONUC/IONUC Guidelines for the
Diagnosis and Management of Patients With Thoracic Aortic Disease.
Circulation. [YW]; 121: [id number]. Aortic aneurysm NOS ([YW]-[YW])
2. Cardiomegaly. Sequela of previous percutaneous PFO closure
device.

## 2021-09-23 MED ORDER — GADOBUTROL 1 MMOL/ML IV SOLN
10.0000 mL | Freq: Once | INTRAVENOUS | Status: AC | PRN
  Administered 2021-09-23: 10 mL via INTRAVENOUS

## 2021-09-25 ENCOUNTER — Telehealth: Payer: Self-pay

## 2021-09-25 DIAGNOSIS — I712 Thoracic aortic aneurysm, without rupture, unspecified: Secondary | ICD-10-CM

## 2021-09-25 NOTE — Telephone Encounter (Signed)
-----   Message from Liliane Shi, [place] sent at 09/24/2021  5:59 PM EDT ----- ?Aorta size has increased since previous study.  It was previously 41-42 mm.  It is now 45-46 mm. ?Given the increase in size, he should be followed by cardiothoracic surgery. ?PLAN:  ?-Refer to CT surgery for thoracic aortic aneurysm ?Richardson Dopp, [name]    ?09/24/2021 5:56 PM   ? ?

## 2021-09-25 NOTE — Telephone Encounter (Signed)
Called and spoke with patient about results and referral to CTS. Pt understands someone will call to schedule. No further questions at this time. ?

## 2021-10-19 ENCOUNTER — Other Ambulatory Visit: Payer: Self-pay

## 2021-10-19 ENCOUNTER — Emergency Department (HOSPITAL_COMMUNITY)
Admission: EM | Admit: 2021-10-19 | Discharge: 2021-10-19 | Disposition: A | Discharge status: Home / Self Care | Payer: 59 | Attending: Emergency Medicine | Admitting: Emergency Medicine

## 2021-10-19 ENCOUNTER — Encounter (HOSPITAL_COMMUNITY): Payer: Self-pay | Admitting: Emergency Medicine

## 2021-10-19 ENCOUNTER — Emergency Department (HOSPITAL_COMMUNITY): Payer: 59

## 2021-10-19 DIAGNOSIS — F0631 Mood disorder due to known physiological condition with depressive features: Secondary | ICD-10-CM

## 2021-10-19 DIAGNOSIS — F329 Major depressive disorder, single episode, unspecified: Secondary | ICD-10-CM | POA: Insufficient documentation

## 2021-10-19 DIAGNOSIS — Z7982 Long term (current) use of aspirin: Secondary | ICD-10-CM | POA: Insufficient documentation

## 2021-10-19 DIAGNOSIS — R531 Weakness: Secondary | ICD-10-CM | POA: Diagnosis present

## 2021-10-19 LAB — BASIC METABOLIC PANEL
Anion gap: 7 (ref 5–15)
BUN: 14 mg/dL (ref 6–20)
CO2: 26 mmol/L (ref 22–32)
Calcium: 9.9 mg/dL (ref 8.9–10.3)
Chloride: 108 mmol/L (ref 98–111)
Creatinine, Ser: 1.12 mg/dL (ref 0.61–1.24)
GFR, Estimated: >60 mL/min (ref >60)
Glucose, Bld: 113 mg/dL — ABNORMAL HIGH (ref 70–99)
Potassium: 4 mmol/L (ref 3.5–5.1)
Sodium: 141 mmol/L (ref 135–145)

## 2021-10-19 LAB — CBC
HCT: 49.4 % (ref 39.0–52.0)
Hemoglobin: 17.2 g/dL — ABNORMAL HIGH (ref 13.0–17.0)
MCH: 32.8 pg (ref 26.0–34.0)
MCHC: 34.8 g/dL (ref 30.0–36.0)
MCV: 94.1 fL (ref 80.0–100.0)
Platelets: 218 10*3/uL (ref 150–400)
RBC: 5.25 MIL/uL (ref 4.22–5.81)
RDW: 12.7 % (ref 11.5–15.5)
WBC: 6.2 10*3/uL (ref 4.0–10.5)
nRBC: 0 % (ref 0.0–0.2)

## 2021-10-19 LAB — ACETAMINOPHEN LEVEL: Acetaminophen (Tylenol), Serum: <10 ug/mL — ABNORMAL LOW (ref 10–30)

## 2021-10-19 LAB — HEPATIC FUNCTION PANEL
ALT: 79 U/L — ABNORMAL HIGH (ref 0–44)
AST: 47 U/L — ABNORMAL HIGH (ref 15–41)
Albumin: 4.3 g/dL (ref 3.5–5.0)
Alkaline Phosphatase: 93 U/L (ref 38–126)
Bilirubin, Direct: 0.2 mg/dL (ref 0.0–0.2)
Indirect Bilirubin: 1.4 mg/dL — ABNORMAL HIGH (ref 0.3–0.9)
Total Bilirubin: 1.6 mg/dL — ABNORMAL HIGH (ref 0.3–1.2)
Total Protein: 8.2 g/dL — ABNORMAL HIGH (ref 6.5–8.1)

## 2021-10-19 LAB — TROPONIN I (HIGH SENSITIVITY): Troponin I (High Sensitivity): 5 ng/L (ref <18)

## 2021-10-19 LAB — SALICYLATE LEVEL: Salicylate Lvl: <7 mg/dL — ABNORMAL LOW (ref 7.0–30.0)

## 2021-10-19 IMAGING — CR DG CHEST 2V
2 series · 2 of 2 positions shown · non-contrast
Comparison: [DATE] and prior studies

CLINICAL DATA: Acute chest pain and weakness.

EXAM:
CHEST - 2 VIEW

[chest pa]
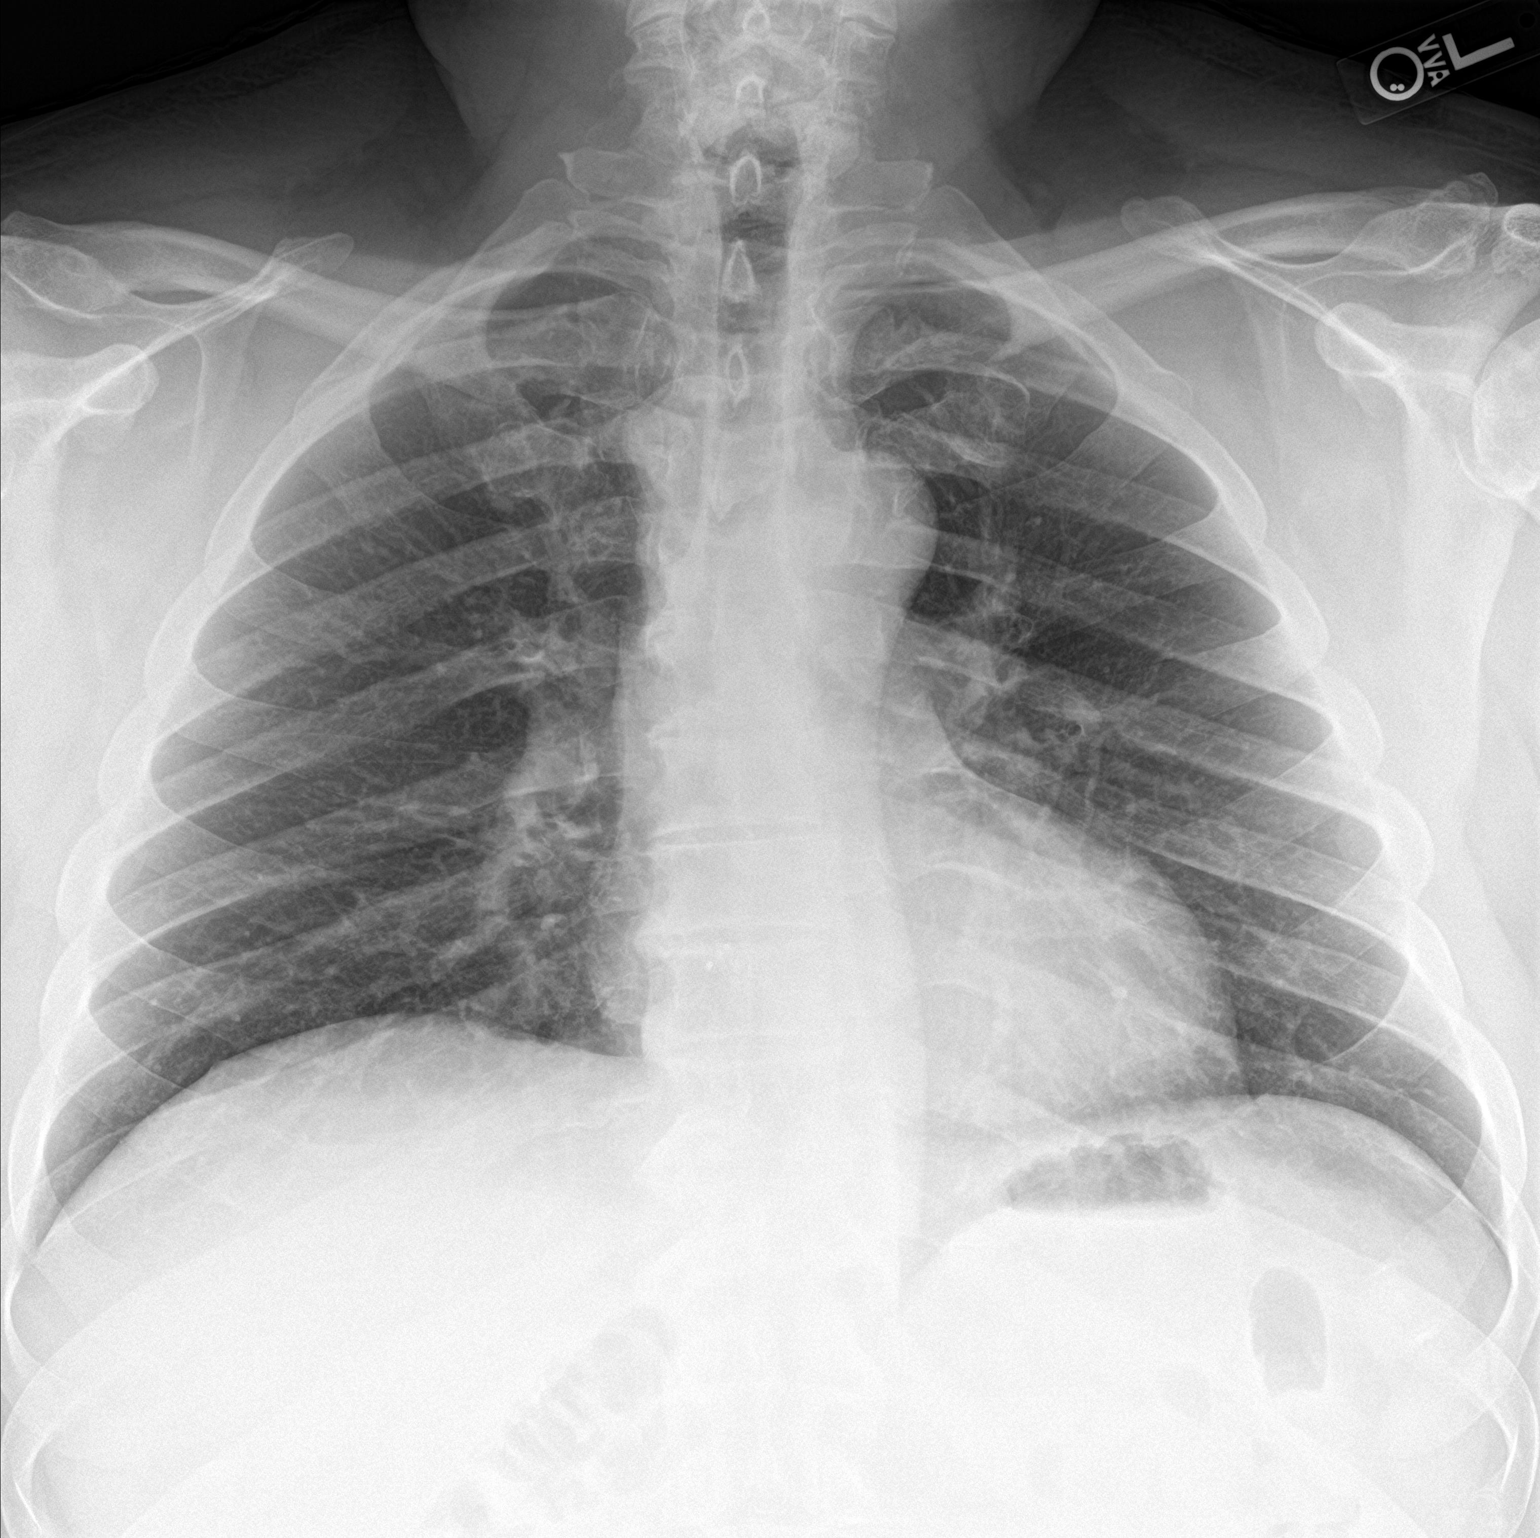

[chest lat]
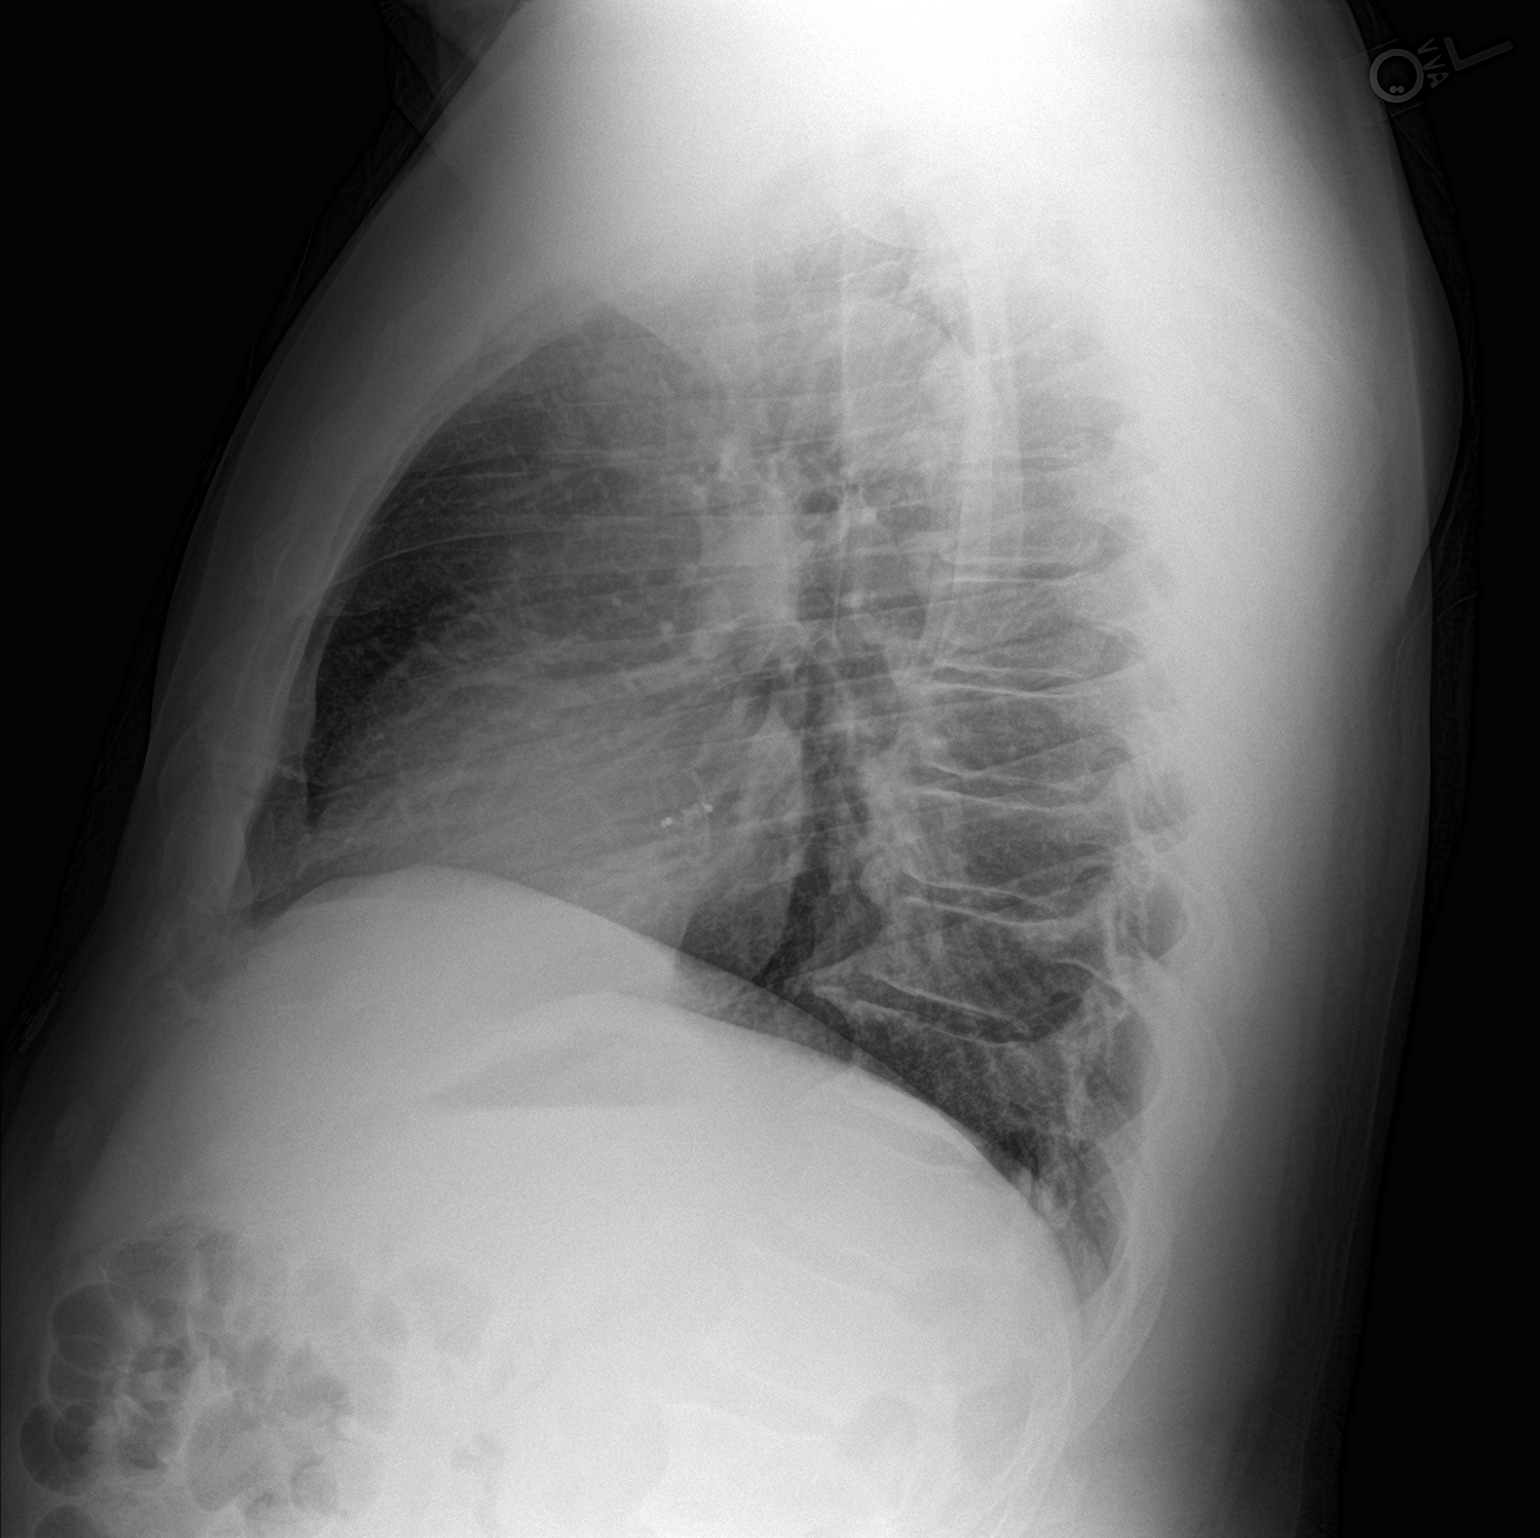

[2 of 2 positions shown; findings below may reference images not displayed]

FINDINGS: The cardiomediastinal silhouette is unremarkable.

Cardiac closure device is again noted.

There is no evidence of focal airspace disease, pulmonary edema,
suspicious pulmonary nodule/mass, pleural effusion, or pneumothorax.

No acute bony abnormalities are identified.
IMPRESSION: No active cardiopulmonary disease.

## 2021-10-19 MED ORDER — SODIUM CHLORIDE 0.9% FLUSH: 3.0000 mL | Freq: Once | INTRAVENOUS | Status: DC

## 2021-10-19 NOTE — ED Triage Notes (Addendum)
Pt states he has been on an emotional roller coaster for 2 weeks.  Tearful during triage.  Reports history of stroke and generalized weakness over the past 2 weeks.  Also reports busted blood vessel in R eye this morning.  No numbness or weakness.  Denies SI/HI.

## 2021-10-19 NOTE — ED Notes (Signed)
Pt uninventoried belongings placed in locker 3 of purple zone.

## 2021-10-19 NOTE — ED Provider Triage Note (Signed)
Emergency Medicine Provider Triage Evaluation Note  [name] , a 50 y.o. male  was evaluated in triage.  Pt complains of chest pain, feelings of hopelessness, depression, anxiety and feeling like he is spiraling. He states this has been worsened over the past 2 weeks.  He previously had a stroke and since then has had emotional difficulties per his report. He does note he has been having some chest pains over the past few weeks however attributes that to when he is working.  He notes that he has had a vague thoughts that maybe things would be better off if he was not here.  He states that he would never actually harm himself as he has had someone close to him died from suicide. He denies any HI or AVH.  He feels like he needs help and cannot keep going like this.   He does state that he recently had a scan showing his aorta was slightly dilated.  He has been anxious about this and wonders if that may be some of what started this emotional roller coaster he feels like he is on.  Physical Exam  BP 108/85 (BP Location: Right Arm)   Pulse 97   Temp 98.4 F (36.9 C) (Oral)   Resp 17   SpO2 96%  Gen:   Awake, no distress   Resp:  Normal effort  MSK:   Moves extremities without difficulty  Other:  Tearful, flat  Medical Decision Making  Medically screening exam initiated at 11:48 AM.  Appropriate orders placed.  SRIJAN GIVAN was informed that the remainder of the evaluation will be completed by another provider, this initial triage assessment does not replace that evaluation, and the importance of remaining in the ED until their evaluation is complete.  Note: Portions of this report may have been transcribed using voice recognition software. Every effort was made to ensure accuracy; however, inadvertent computerized transcription errors may be present    Cristina Gong, [name] 10/19/21 1201

## 2021-10-19 NOTE — ED Provider Notes (Addendum)
[facility]   CSN: [id number] Arrival date & time: 10/19/21  1053     History  Chief Complaint  Patient presents with   Weakness    Henry SchwartzMichael J Steury is a 51 y.o. male.  51 yo M with a chief complaints of feeling down.  He tells me that he has been emotionally labile and is having episodes where he feels very depressed and sometimes cries uncontrollably.  He has been having trouble communicating with his wife and had an episode this morning where he is not sure exactly what he said but made her very upset.  He felt like he needed to talk to somebody about his mental health.  He has a history of a stroke and was wondering if maybe this was due to the stroke or a new stroke.  He denies one-sided numbness or weakness denies difficulty speech or swallowing.  He has dizziness off and on since his stroke and thinks maybe it is got little bit worse but has no difficulty ambulating.  Has had some headaches off and on but nothing consistent.  No head trauma.   Weakness     Home Medications Prior to Admission medications   Medication Sig Start Date End Date Taking? Authorizing Provider  Alirocumab (PRALUENT) 150 MG/ML SOAJ Inject 150 mg into the skin every 14 (fourteen) days. 03/04/21   Tonny Bollmanooper, Daisuke, MD  aspirin EC 81 MG EC tablet Take 1 tablet (81 mg total) by mouth daily. Swallow whole. Patient not taking: Reported on 02/26/2021 11/23/19   Layne BentonBiby, Sharon L, NP  clonazePAM (KLONOPIN) 0.5 MG tablet Take 1 tablet (0.5 mg total) by mouth 2 (two) times daily as needed for anxiety (anxiety, sleep, dizziness). Patient not taking: Reported on 08/06/2021 10/15/20   Ihor AustinMcCue, Jessica, NP  ibuprofen (ADVIL) 200 MG tablet Take 200 mg by mouth every 6 (six) hours as needed for mild pain.    [provider]  losartan (COZAAR) 50 MG tablet Take 50 mg by mouth daily.    [provider]  meclizine (ANTIVERT) 25 MG tablet TAKE 1 TABLET(25 MG) BY  MOUTH THREE TIMES DAILY AS NEEDED FOR DIZZINESS OR NAUSEA Patient not taking: Reported on 02/26/2021 07/23/20   Erick ColaceKirsteins, Andrew E, MD  ondansetron (ZOFRAN-ODT) 4 MG disintegrating tablet Take 1 tablet (4 mg total) by mouth every 8 (eight) hours as needed for nausea or vomiting. Patient not taking: Reported on 08/06/2021 05/05/21   Roxy HorsemanBrowning, Robert, [name]  oxyCODONE-acetaminophen (PERCOCET/ROXICET) 5-325 MG tablet Take 1 tablet by mouth every 6 (six) hours as needed for severe pain. 08/07/21   Cristina GongHammond, Elizabeth W, [name]  tamsulosin (FLOMAX) 0.4 MG CAPS capsule Take 1 capsule (0.4 mg total) by mouth daily as needed (kidney stone). 08/07/21   Cristina GongHammond, Elizabeth W, [name]      Allergies    Patient has no known allergies.    Review of Systems   Review of Systems  Neurological:  Positive for weakness.   Physical Exam Updated Vital Signs BP 114/74   Pulse 87   Temp 98.2 F (36.8 C) (Oral)   Resp 17   SpO2 99%  Physical Exam Vitals and nursing note reviewed.  Constitutional:      Appearance: He is well-developed.  HENT:     Head: Normocephalic and atraumatic.  Eyes:     Pupils: Pupils are equal, round, and reactive to light.  Neck:     Vascular: No JVD.  Cardiovascular:  Rate and Rhythm: Normal rate and regular rhythm.     Heart sounds: No murmur heard.   No friction rub. No gallop.  Pulmonary:     Effort: No respiratory distress.     Breath sounds: No wheezing.  Abdominal:     General: There is no distension.     Tenderness: There is no abdominal tenderness. There is no guarding or rebound.  Musculoskeletal:        General: Normal range of motion.     Cervical back: Normal range of motion and neck supple.  Skin:    Coloration: Skin is not pale.     Findings: No rash.  Neurological:     Mental Status: He is alert and oriented to person, place, and time.     Cranial Nerves: Cranial nerves 2-12 are intact.     Sensory: Sensation is intact.     Motor: Motor function is intact.      Coordination: Coordination is intact.     Comments: Ambulates without issue.  Benign neuro exam.  Psychiatric:        Behavior: Behavior normal.    ED Results / Procedures / Treatments   Labs (all labs ordered are listed, but only abnormal results are displayed) Labs Reviewed  BASIC METABOLIC PANEL - Abnormal; Notable for the following components:      Result Value   Glucose, Bld 113 (*)    All other components within normal limits  CBC - Abnormal; Notable for the following components:   Hemoglobin 17.2 (*)    All other components within normal limits  ACETAMINOPHEN LEVEL - Abnormal; Notable for the following components:   Acetaminophen (Tylenol), Serum <10 (*)    All other components within normal limits  SALICYLATE LEVEL - Abnormal; Notable for the following components:   Salicylate Lvl <7.0 (*)    All other components within normal limits  HEPATIC FUNCTION PANEL - Abnormal; Notable for the following components:   Total Protein 8.2 (*)    AST 47 (*)    ALT 79 (*)    Total Bilirubin 1.6 (*)    Indirect Bilirubin 1.4 (*)    All other components within normal limits  URINALYSIS, ROUTINE W REFLEX MICROSCOPIC  TROPONIN I (HIGH SENSITIVITY)  TROPONIN I (HIGH SENSITIVITY)    EKG EKG Interpretation  Date/Time:  Sunday October 19 2021 11:35:04 EDT Ventricular Rate:  85 PR Interval:  170 QRS Duration: 104 QT Interval:  356 QTC Calculation: 423 R Axis:   44 Text Interpretation: Normal sinus rhythm Cannot rule out Anterior infarct , age undetermined T wave abnormality, consider inferior ischemia Abnormal ECG No significant change since last tracing Confirmed by Melene Plan [phone]) on 10/19/2021 3:33:42 PM  Radiology DG Chest 2 View  Result Date: 10/19/2021 CLINICAL DATA:  Acute chest pain and weakness. EXAM: CHEST - 2 VIEW COMPARISON:  06/28/2020 and prior studies FINDINGS: The cardiomediastinal silhouette is unremarkable. Cardiac closure device is again noted. There is no  evidence of focal airspace disease, pulmonary edema, suspicious pulmonary nodule/mass, pleural effusion, or pneumothorax. No acute bony abnormalities are identified. IMPRESSION: No active cardiopulmonary disease. Electronically Signed   By: Harmon Pier M.D.   On: 10/19/2021 13:49    Procedures Procedures    Medications Ordered in ED Medications  sodium chloride flush (NS) 0.9 % injection 3 mL (has no administration in time range)    ED Course/ Medical Decision Making/ A&P  Medical Decision Making Amount and/or Complexity of Data Reviewed Labs: ordered.   51 yo M with a chief complaints of depression.  He feels like this has been going on since he had a stroke but is gotten a bit worse over the last couple weeks.  He was concerned that this may be due to his stroke or may be a new stroke but has trouble quantifying that well.  Has a benign neurologic exam for me.  He had a laboratory evaluation done through triage without significant anemia no significant electrolyte abnormality trivial LFT elevation chest x-ray independently interpreted by me without focal infiltrate or pneumothorax. EKG without acute ischemia.  I discussed different options with the patient including having him evaluated by mental health here or having him go POV to the behavioral health urgent care center.  The patient is pretty adamant that he is not suicidal or homicidal.  He just felt like he needed someone to talk to about this.  Was asking for referral.  I discussed with him that we do not typically give specific referrals for psychiatric needs.  I suggest that he discuss this with his family doctor and neurologist and see if they had any when they recommended.  3:36 PM:  I have discussed the diagnosis/risks/treatment options with the patient.  Evaluation and diagnostic testing in the emergency department does not suggest an emergent condition requiring admission or immediate intervention  beyond what has been performed at this time.  They will follow up with  PCP, neuro. We also discussed returning to the ED immediately if new or worsening sx occur. We discussed the sx which are most concerning (e.g., sudden worsening pain, fever, inability to tolerate by mouth, HI, SI) that necessitate immediate return. Medications administered to the patient during their visit and any new prescriptions provided to the patient are listed below.  Medications given during this visit Medications  sodium chloride flush (NS) 0.9 % injection 3 mL (has no administration in time range)     The patient appears reasonably screen and/or stabilized for discharge and I doubt any other medical condition or other [facility] requiring further screening, evaluation, or treatment in the ED at this time prior to discharge.          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

## 2021-10-19 NOTE — Discharge Instructions (Signed)
Please call your family doctor and neurologist and let them know about your visit here today.  See when they want to see you in the office.  Please asked them about seeing someone for your mental health.  Please return to the Emergency Department for thoughts of hurting yourself or others or if you start having hallucinations.  Also return for worsening stroke symptoms one-sided weakness difficulty with speech or swallowing.

## 2021-10-19 NOTE — ED Notes (Signed)
Pt denied SI/HI to the triage nurse.  Per PA, Spoke with PA and states that he had thoughts of SI with no intentions of acting on it because of someone he knows that did that.  Per PA, pt changing into burgundy scrubs and will remain at triage.

## 2021-11-20 ENCOUNTER — Telehealth: Payer: Self-pay | Admitting: Pharmacist

## 2021-11-20 NOTE — Telephone Encounter (Signed)
Pt's formulary changed from Praluent to Repatha. Repatha available on his plan without prior authorization.  Copay card: Johnney Killian: [id number] PCN: [id number] GRP: [id number] ID: [id number]  Left message for pt to make him aware of med change. Will need new rx sent to pharmacy and copay card info sent to pt in MyChart message once he is made aware of med change.

## 2021-11-24 ENCOUNTER — Encounter: Payer: Self-pay | Admitting: Pharmacist

## 2021-11-24 MED ORDER — REPATHA SURECLICK 140 MG/ML ~~LOC~~ SOAJ
1.0000 | SUBCUTANEOUS | 11 refills | Status: DC
Start: 2021-11-24 — End: 2023-06-11

## 2021-11-24 NOTE — Telephone Encounter (Signed)
3rd message left for pt, still unable to reach. Will send in Repatha and update pt via MyChart message.

## 2021-11-26 ENCOUNTER — Encounter: Payer: 59 | Admitting: Surgery

## 2022-02-04 ENCOUNTER — Encounter: Payer: 59 | Admitting: Surgery

## 2022-02-06 ENCOUNTER — Encounter: Payer: 59 | Admitting: Surgery

## 2022-02-09 ENCOUNTER — Encounter: Payer: Self-pay | Admitting: Surgery

## 2022-02-09 ENCOUNTER — Institutional Professional Consult (permissible substitution): Payer: 59 | Admitting: Surgery

## 2022-02-09 VITALS — BP 131/85 | HR 81 | Resp 20 | Ht 76.0 in | Wt 330.0 lb

## 2022-02-09 DIAGNOSIS — I7121 Aneurysm of the ascending aorta, without rupture: Secondary | ICD-10-CM

## 2022-02-09 NOTE — Progress Notes (Signed)
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  He has been evaluated for chest pain in the past by cardiology and underwent a gated cardiac CTA in February 2022 showing a coronary calcium score of 336 and minimal calcified plaque in the coronary arteries.  His most recent echo in March 2022 showed a normal aortic valve with mild to moderate sclerosis/calcification with no evidence of stenosis or regurgitation.  Left ventricular ejection fraction was 55 to 60% with mild concentric LVH.  There is no shunting seen across the interatrial septum.  His gated cardiac CTA showed the ascending aorta to measure 4.1 cm although this was fairly proximal due to the limits on the scan.  He had an MRA of the chest on 09/27/2020 which showed the sinotubular junction to measure 40 mm with the proximal ascending aorta to measure 41 mm and aortic arch to measure 30 mm.  The proximal descending aorta was 27 mm and the distal descending aorta was 26 mm.  A CT of the chest without contrast on 10/23/2020 showed the ascending aorta to have a diameter of 4.2 cm.  He underwent an MRI of the chest on 09/23/2021 showing the sinotubular junction to be 4.2 cm and the proximal ascending aorta measured 4.6 cm.  The proximal descending aorta was measured at 30 mm and the distal descending aorta measured at 29 mm.  He is here today by himself.  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  He is a non-smoker. Past Medical History:  Diagnosis Date   Arthritis    neck   Carpal tunnel syndrome on  both sides 08/2014   DDD (degenerative disc disease), lumbar    History of kidney stones    Hypertension    under control with med., has been on med. x 1 yr.   Lung nodule    noted on CTA in 2/22 // Chest CT WO contrast 6/22: LUL nodule resolved, ascending aorta 4.2 x 4.2 cm, cholelithiasis, R kidney stone   Sleep apnea    CPAP but does not use   Stroke (HCC) 11/16/2019   Thoracic aortic aneurysm (HCC)    MRA 5/22: Ascending thoracic aorta aneurysm 41 mm    Past Surgical History:  Procedure Laterality Date   BUBBLE STUDY  11/21/2019   Procedure: BUBBLE STUDY;  Surgeon: [name], MD;  Location: [facility] ENDOSCOPY;  Service: Cardiovascular;;   CARPAL TUNNEL RELEASE Left 09/07/2014   Procedure: LEFT CARPAL TUNNEL RELEASE;  Surgeon: [name], MD;  Location: [facility];  Service: Orthopedics;  Laterality: Left;   CARPAL TUNNEL RELEASE Right 10/12/2014   Procedure: RIGHT CARPAL TUNNEL RELEASE;  Surgeon: [name], MD;  Location: [facility];  Service: Orthopedics;  Laterality: Right;   KNEE ARTHROSCOPY Left    PATENT FORAMEN OVALE(PFO) CLOSURE N/A 01/31/2020   Procedure: PATENT FORAMEN OVALE (PFO) CLOSURE;  Surgeon: [name], MD;  Location: [facility] INVASIVE CV LAB;  Service: Cardiovascular;  Laterality: N/A;   SUBOCCIPITAL CRANIECTOMY CERVICAL LAMINECTOMY N/A 11/17/2019   Procedure: SUBOCCIPITAL DECOMPRESSIVE CRANIECTOMY;  Surgeon: [name],  MD;  Location: [facility] OR;  Service: Neurosurgery;  Laterality: N/A;   TEE WITHOUT CARDIOVERSION N/A 11/21/2019   Procedure: TRANSESOPHAGEAL ECHOCARDIOGRAM (TEE);  Surgeon: Jodelle Redhristopher, Bridgette, MD;  [facility];  Service: Cardiovascular;  Laterality: N/A;    Family History  Problem Relation Age of Onset   Heart attack Mother   There is no family history of aortic aneurysm, aortic dissection, or connective tissue disorder.  Social History Social History   Tobacco Use   Smoking status:  Never   Smokeless tobacco: Never  Vaping Use   Vaping Use: Former  Substance Use Topics   Alcohol use: Not Currently    Comment: quit 2006   Drug use: No    Current Outpatient Medications  Medication Sig Dispense Refill   clonazePAM (KLONOPIN) 0.5 MG tablet Take 1 tablet (0.5 mg total) by mouth 2 (two) times daily as needed for anxiety (anxiety, sleep, dizziness). 60 tablet 0   losartan (COZAAR) 50 MG tablet Take 50 mg by mouth daily.     aspirin EC 81 MG EC tablet Take 1 tablet (81 mg total) by mouth daily. Swallow whole. (Patient not taking: Reported on 02/09/2022) 30 tablet 11   Evolocumab (REPATHA SURECLICK) 140 MG/ML SOAJ Inject 1 Pen into the skin every 14 (fourteen) days. (Patient not taking: Reported on 02/09/2022) 2 mL 11   ibuprofen (ADVIL) 200 MG tablet Take 200 mg by mouth every 6 (six) hours as needed for mild pain. (Patient not taking: Reported on 02/09/2022)     sertraline (ZOLOFT) 50 MG tablet Take 50 mg by mouth daily.     No current facility-administered medications for this visit.    No Known Allergies  Review of Systems  Constitutional:  Positive for fatigue.  HENT: Negative.    Eyes: Negative.   Respiratory:  Positive for apnea and shortness of breath.   Cardiovascular: Negative.   Gastrointestinal: Negative.   Endocrine: Negative.   Genitourinary: Negative.        Kidney stones  Musculoskeletal:  Positive for arthralgias.  Skin: Negative.   Allergic/Immunologic: Negative.   Neurological:  Positive for dizziness. Negative for syncope.  Hematological: Negative.   Psychiatric/Behavioral:  The patient is nervous/anxious.        Depression    BP 131/85 (BP Location: Right Arm, Patient Position: Sitting)   Pulse 81   Resp 20   Ht 6\' 4"  (1.93 m)   Wt (!) 330 lb (149.7 kg)   SpO2 96% Comment: RA  BMI 40.17 kg/m  Physical Exam Constitutional:      Appearance: Normal appearance. He is obese.     Comments: BMI 40.17 kg/m.  330 pounds.  6 foot 4 inches  tall  HENT:     Head: Normocephalic and atraumatic.  Eyes:     Extraocular Movements: Extraocular movements intact.     Conjunctiva/sclera: Conjunctivae normal.     Pupils: Pupils are equal, round, and reactive to light.  Neck:     Vascular: No carotid bruit.  Cardiovascular:     Rate and Rhythm: Normal rate and regular rhythm.     Heart sounds: Normal heart sounds. No murmur heard. Pulmonary:     Effort: Pulmonary effort is normal.     Breath sounds: Normal breath sounds.  Musculoskeletal:        General: No swelling.  Skin:    General: Skin is warm and dry.  Neurological:     General: No focal deficit present.     Mental Status: He  is alert and oriented to person, place, and time.  Psychiatric:        Mood and Affect: Mood normal.        Behavior: Behavior normal.      Diagnostic Tests:  Narrative & Impression  CLINICAL DATA:  Follow-up dilatation of the ascending thoracic aorta.   EXAM: MRA CHEST WITH OR WITHOUT CONTRAST   TECHNIQUE: Angiographic images of the chest were obtained using MRA technique with intravenous contrast.   CONTRAST:  1mL GADAVIST GADOBUTROL 1 MMOL/ML IV SOLN   COMPARISON:  MRA of the chest-09/27/2020   FINDINGS: Vascular Findings:   Increased aneurysmal dilatation of the ascending thoracic aorta with measurements as follows. No evidence of thoracic aortic dissection or perivascular stranding.   Conventional configuration of the aortic arch. The branch vessels of the aortic arch appear widely patent throughout their imaged courses.   Borderline cardiomegaly. No pericardial effusion. Sequela of previous percutaneous PFO closure.   Although this examination was not tailored for the evaluation the pulmonary arteries, there are no discrete filling defects within the central pulmonary arterial tree to suggest central pulmonary embolism. Normal caliber of the main pulmonary artery.    -------------------------------------------------------------   Thoracic aortic measurements:   SINOTUBULAR JUNCTION: 42 mm as measured in greatest oblique short axis sagittal dimension (image 61, series 13).   PROXIMAL ASCENDING THORACIC AORTA: 46 mm as measured in greatest oblique short axis axial dimension (image 19, series 4) at the level of the main pulmonary artery and approximately 45 mm as measured in greatest oblique short axis sagittal dimension (sagittal image 49, series 13), previously, 41 mm and 42 mm in diameter by my direct remeasurement.   AORTIC ARCH: 33 mm as measured in greatest oblique short axis sagittal dimension.   PROXIMAL DESCENDING THORACIC AORTA: 30 mm as measured in greatest oblique short axis axial dimension at the level of the main pulmonary artery.   DISTAL DESCENDING THORACIC AORTA: 29 mm as measured in greatest oblique short axis axial dimension at the level of the diaphragmatic hiatus.   Review of the MIP images confirms the above findings.   -------------------------------------------------------------   Non-Vascular Findings:   Mediastinum/Lymph Nodes: No bulky mediastinal, hilar or axillary lymphadenopathy.   Lungs/Pleura: No discrete focal airspace opacities. No pleural effusion.   Upper abdomen: Limited evaluation of the upper abdomen demonstrates two subcentimeter nonenhancing T2 intense lesions within the left kidney (coronal image 47, series 20; image 22, series 3)), too small to accurately characterize though likely representative of renal cysts.   Musculoskeletal: No discrete acute or aggressive osseous lesions. Stigmata of dish throughout the thoracic spine.   IMPRESSION: 1. Interval increase in uncomplicated fusiform aneurysmal dilatation of the ascending thoracic aorta now measuring 45-46 mm in diameter, previously, 41-42 mm (when compared to the 09/2020 examination). Ascending thoracic aortic aneurysm. Recommend  semi-annual imaging followup by CTA or MRA and referral to cardiothoracic surgery if not already obtained. This recommendation follows 2010 ACCF/AHA/AATS/ACR/ASA/SCA/SCAI/SIR/STS/SVM Guidelines for the Diagnosis and Management of Patients With Thoracic Aortic Disease. Circulation. 2010; 121: X646-O032. Aortic aneurysm NOS (ICD10-I71.9) 2. Cardiomegaly. Sequela of previous percutaneous PFO closure device.     Electronically Signed   By: Simonne Come M.D.   On: 09/24/2021 10:45     ECHOCARDIOGRAM REPORT         Patient Name:   LENELL LAMA Date of Exam: 02/26/2021  Medical Rec #:  [id number]      Height:       75.0 in  Accession #:    [id number]     Weight:       325.0 lb  Date of Birth:  [birth year]      BSA:          2.697 m  Patient Age:    50 years       BP:           136/97 mmHg  Patient Gender: M              HR:           79 bpm.  Exam Location:  [address]   Procedure: 2D Echo, Cardiac Doppler, Color Doppler and Saline Contrast  Bubble             Study   Indications:    Z87.74 s/p PFO closure     History:        Patient has no prior history of Echocardiogram  examinations and                  Patient has prior history of Echocardiogram examinations,  most                  recent 07/25/2020. Stroke, Signs/Symptoms:Palpitations;  Risk                  Factors:Hypertension, HLD and Sleep Apnea.     Sonographer:    Clearence Ped RCS  Referring Phys: [id number] KATHRYN R THOMPSON   IMPRESSIONS     1. Left ventricular ejection fraction, by estimation, is 60 to 65%. The  left ventricle has normal function. The left ventricle has no regional  wall motion abnormalities. There is mild left ventricular hypertrophy of  the basal-septal segment. Left  ventricular diastolic parameters were normal.   2. Right ventricular systolic function is normal. The right ventricular  size is normal. There is normal pulmonary artery systolic pressure.   3. Left atrial size was mildly  dilated.   4. Right atrial size was mildly dilated.   5. The mitral valve is normal in structure. Trivial mitral valve  regurgitation. No evidence of mitral stenosis.   6. The aortic valve is normal in structure. There is mild calcification  of the aortic valve. Aortic valve regurgitation is not visualized. No  aortic stenosis is present.   7. Aortic dilatation noted. There is moderate dilatation of the aortic  root, measuring 46 mm. There is moderate dilatation of the ascending  aorta, measuring 44 mm.   8. The inferior vena cava is normal in size with greater than 50%  respiratory variability, suggesting right atrial pressure of 3 mmHg.   9. Agitated saline contrast bubble study was negative, with no evidence  of any interatrial shunt.   FINDINGS   Left Ventricle: Left ventricular ejection fraction, by estimation, is 60  to 65%. The left ventricle has normal function. The left ventricle has no  regional wall motion abnormalities. The left ventricular internal cavity  size was normal in size. There is   mild left ventricular hypertrophy of the basal-septal segment. Left  ventricular diastolic parameters were normal.   Right Ventricle: The right ventricular size is normal. No increase in  right ventricular wall thickness. Right ventricular systolic function is  normal. There is normal pulmonary artery systolic pressure. The tricuspid  regurgitant velocity is 1.78 m/s, and   with an assumed right atrial pressure of 3 mmHg, the estimated right  ventricular systolic pressure is 15.7 mmHg.  Left Atrium: Left atrial size was mildly dilated.   Right Atrium: Right atrial size was mildly dilated.   Pericardium: There is no evidence of pericardial effusion.   Mitral Valve: The mitral valve is normal in structure. Trivial mitral  valve regurgitation. No evidence of mitral valve stenosis.   Tricuspid Valve: The tricuspid valve is normal in structure. Tricuspid  valve regurgitation is  trivial. No evidence of tricuspid stenosis.   Aortic Valve: The aortic valve is normal in structure. There is mild  calcification of the aortic valve. Aortic valve regurgitation is not  visualized. No aortic stenosis is present.   Pulmonic Valve: The pulmonic valve was normal in structure. Pulmonic valve  regurgitation is trivial. No evidence of pulmonic stenosis.   Aorta: The aortic root is normal in size and structure and aortic  dilatation noted. There is moderate dilatation of the aortic root,  measuring 46 mm. There is moderate dilatation of the ascending aorta,  measuring 44 mm.   Venous: The inferior vena cava is normal in size with greater than 50%  respiratory variability, suggesting right atrial pressure of 3 mmHg.   IAS/Shunts: No atrial level shunt detected by color flow Doppler. Agitated  saline contrast was given intravenously to evaluate for intracardiac  shunting. Agitated saline contrast bubble study was negative, with no  evidence of any interatrial shunt.      LEFT VENTRICLE  PLAX 2D  LVIDd:         5.10 cm   Diastology  LVIDs:         3.20 cm   LV e' medial:    6.64 cm/s  LV PW:         0.90 cm   LV E/e' medial:  9.0  LV IVS:        1.40 cm   LV e' lateral:   10.90 cm/s  LVOT diam:     2.60 cm   LV E/e' lateral: 5.5  LV SV:         104  LV SV Index:   39  LVOT Area:     5.31 cm      RIGHT VENTRICLE  RV Basal diam:  3.70 cm  RV S prime:     13.10 cm/s  TAPSE (M-mode): 2.2 cm  RVSP:           15.7 mmHg   LEFT ATRIUM             Index        RIGHT ATRIUM           Index  LA diam:        3.50 cm 1.30 cm/m   RA Pressure: 3.00 mmHg  LA Vol (A2C):   80.0 ml 29.66 ml/m  RA Area:     18.90 cm  LA Vol (A4C):   42.5 ml 15.76 ml/m  RA Volume:   52.00 ml  19.28 ml/m  LA Biplane Vol: 61.7 ml 22.87 ml/m   AORTIC VALVE  LVOT Vmax:   105.00 cm/s  LVOT Vmean:  69.200 cm/s  LVOT VTI:    0.196 m     AORTA  Ao Root diam: 4.60 cm  Ao Asc diam:  4.40 cm    MITRAL VALVE               TRICUSPID VALVE  MV Area (PHT):             TR Peak grad:   12.7 mmHg  MV  Decel Time:             TR Vmax:        178.00 cm/s  MV E velocity: 59.70 cm/s  Estimated RAP:  3.00 mmHg  MV A velocity: 73.90 cm/s  RVSP:           15.7 mmHg  MV E/A ratio:  0.81                             SHUNTS                             Systemic VTI:  0.20 m                             Systemic Diam: 2.60 cm   Glori Bickers MD  Electronically signed by Glori Bickers MD  Signature Date/Time: 02/26/2021/3:16:11 PM         Final     Impression:  This 51 year old gentleman has a fusiform ascending aortic aneurysm measured on his current MRA of the chest at 4.6 cm.  This is slightly larger than it was measured on CTA of the chest 1 year prior in June 2022.  I have personally reviewed both studies and I think the diameter is probably overestimated a little on his most recent study.  It is still well below the surgical threshold of 5.5 cm.  I reviewed the MRI images with him and answered his questions.  I stressed the importance of continued good blood pressure control in preventing further enlargement and acute aortic dissection.  I advised him against doing any heavy lifting that might require a Valsalva maneuver and could suddenly raise his blood pressure to high levels.  Plan:  I will plan to see him back in May 2024 with a CTA of the chest for aortic surveillance.  I spent 30 minutes performing this consultation and > 50% of this time was spent face to face counseling and coordinating the care of this patient's ascending aortic aneurysm.  Gaye Pollack, MD [facility] [phone]

## 2022-08-27 ENCOUNTER — Other Ambulatory Visit: Payer: Self-pay | Admitting: Surgery

## 2022-08-27 DIAGNOSIS — I7121 Aneurysm of the ascending aorta, without rupture: Secondary | ICD-10-CM

## 2022-10-14 ENCOUNTER — Ambulatory Visit: Payer: 59 | Admitting: Surgery

## 2022-10-14 ENCOUNTER — Ambulatory Visit
Admission: RE | Admit: 2022-10-14 | Discharge: 2022-10-14 | Disposition: A | Discharge status: Home / Self Care | Payer: 59 | Source: Ambulatory Visit | Attending: Surgery | Admitting: Surgery

## 2022-10-14 ENCOUNTER — Encounter: Payer: Self-pay | Admitting: Surgery

## 2022-10-14 VITALS — BP 133/87 | HR 80 | Resp 20 | Ht 76.0 in | Wt 335.0 lb

## 2022-10-14 DIAGNOSIS — I7121 Aneurysm of the ascending aorta, without rupture: Secondary | ICD-10-CM | POA: Diagnosis not present

## 2022-10-14 MED ORDER — IOPAMIDOL (ISOVUE-370) INJECTION 76%
75.0000 mL | Freq: Once | INTRAVENOUS | Status: AC | PRN
  Administered 2022-10-14: 75 mL via INTRAVENOUS

## 2022-10-14 NOTE — Progress Notes (Signed)
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  This was initially measured at 4.1 cm on a gated cardiac CTA in February 2022 and an MRA of the chest on 09/27/2020. A CT of the chest without contrast on 10/23/2020 showed the ascending aorta to have a diameter of 4.2 cm. He underwent an MRI of the chest on 09/23/2021 showing the sinotubular junction to be 4.2 cm and the proximal ascending aorta measured 4.6 cm.  He continues to feel well overall without chest or back pain.  Current Outpatient Medications  Medication Sig Dispense Refill   aspirin EC 81 MG EC tablet Take 1 tablet (81 mg total) by mouth daily. Swallow whole. 30 tablet 11   clonazePAM (KLONOPIN) 0.5 MG tablet Take 1 tablet (0.5 mg total) by mouth 2 (two) times daily as needed for anxiety (anxiety, sleep, dizziness). 60 tablet 0   Evolocumab (REPATHA SURECLICK) 140 MG/ML SOAJ Inject 1 Pen into the skin every 14 (fourteen) days. 2 mL 11   ibuprofen (ADVIL) 200 MG tablet Take 200 mg by mouth every 6 (six) hours as needed for mild pain.     losartan (COZAAR) 50 MG tablet Take 50 mg by mouth daily.     sertraline (ZOLOFT) 50 MG tablet Take 50 mg by mouth daily.     No current facility-administered medications for this visit.     Physical Exam: BP 133/87   Pulse 80   Resp 20   Ht 6\' 4"  (1.93 m)   Wt (!) 335 lb (152 kg)   SpO2 96% Comment: RA  BMI 40.78 kg/m  He looks well. Cardiac exam shows regular rate and rhythm with normal heart sounds.  There is no murmur. Lungs are clear.  Diagnostic Tests:  Narrative & Impression  CLINICAL DATA:  52 year old male with a history of aortic aneurysm   EXAM: CT ANGIOGRAPHY CHEST WITH CONTRAST    TECHNIQUE: Multidetector CT imaging of the chest was performed using the standard protocol during bolus administration of intravenous contrast. Multiplanar CT image reconstructions and MIPs were obtained to evaluate the vascular anatomy.   RADIATION DOSE REDUCTION: This exam was performed according to the departmental dose-optimization program which includes automated exposure control, adjustment of the mA and/or kV according to patient size and/or use of iterative reconstruction technique.   CONTRAST:  75mL ISOVUE-370 IOPAMIDOL (ISOVUE-370) INJECTION 76%   COMPARISON:  MR 09/23/2021, CT chest 10/23/2020, 07/05/2020,   FINDINGS: Cardiovascular:   Heart:   No cardiomegaly. No pericardial fluid/thickening. Calcifications of left anterior descending and right coronary arteries.   Septal occluder of prior atrial septal defect repair.   Aorta:   Minimal aortic valve calcifications.   Greatest estimated diameter of the aortic annulus 34 mm on the coronal reformatted images.   Greatest estimated diameter of the sino-tubular junction, 36 mm on the coronal images.   Greatest estimated diameter of the ascending aorta on the axial images, 42 mm.   No significant atherosclerotic changes of the aorta. Branch vessels are patent with a 3 vessel arch. Cervical cerebral vessels patent at the base of the neck.   No pedunculated plaque, ulcerated plaque, dissection, periaortic fluid. No wall thickening.   Pulmonary arteries:   Timing of the contrast bolus is not optimized  for evaluation of pulmonary artery filling defects. Unremarkable size of the main pulmonary artery.   Mediastinum/Nodes: No mediastinal adenopathy. Unremarkable appearance of the thoracic esophagus.   Unremarkable appearance of the thoracic inlet.   Lungs/Pleura: Central airways are clear. No pleural effusion. No confluent airspace disease.   No pneumothorax.   Upper Abdomen: No acute finding of the upper  abdomen. Cholelithiasis. Nephrolithiasis.   Musculoskeletal: No acute displaced fracture. Degenerative changes of the spine.   Review of the MIP images confirms the above findings.   IMPRESSION: Relatively unchanged size and configuration of the ascending aorta, estimated 42 mm on the current CT. Aortic aneurysm NOS (ICD10-I71.9).   Coronary artery disease.   Surgical changes of prior ASD repair.   Signed,   Yvone Neu. Miachel Roux, RPVI   [facility]   [facility]     Electronically Signed   By: Gilmer Mor D.O.   On: 10/14/2022 15:53      Impression:  This 52 year old gentleman has a stable 4.2 cm fusiform ascending aortic aneurysm that is well below the surgical threshold of 5.5 cm.  The measurement on his previous MRA was 4.6 cm but probably overestimated.  I reviewed the CTA images with him and answered his questions.  I stressed the importance of continued good blood pressure control in preventing further enlargement and acute aortic dissection.  Plan:  Given the small size of his aneurysm and the stability I recommended a follow-up CTA chest in 2 years.  We will arrange that and see him back at that time.  I spent 15 minutes performing this established patient evaluation and > 50% of this time was spent face to face counseling and coordinating the care of this patient's aortic aneurysm.    Alleen Borne, MD [facility] [phone]

## 2023-01-12 ENCOUNTER — Ambulatory Visit: Payer: 59 | Admitting: Cardiovascular Disease

## 2023-04-02 ENCOUNTER — Ambulatory Visit: Payer: 59 | Admitting: Cardiovascular Disease

## 2023-04-06 ENCOUNTER — Other Ambulatory Visit: Payer: Self-pay

## 2023-04-06 ENCOUNTER — Emergency Department (HOSPITAL_COMMUNITY): Payer: 59

## 2023-04-06 ENCOUNTER — Emergency Department (HOSPITAL_COMMUNITY)
Admission: EM | Admit: 2023-04-06 | Discharge: 2023-04-06 | Disposition: A | Discharge status: Home / Self Care | Payer: 59 | Attending: Emergency Medicine | Admitting: Emergency Medicine

## 2023-04-06 DIAGNOSIS — R109 Unspecified abdominal pain: Secondary | ICD-10-CM | POA: Diagnosis present

## 2023-04-06 DIAGNOSIS — I10 Essential (primary) hypertension: Secondary | ICD-10-CM | POA: Diagnosis not present

## 2023-04-06 DIAGNOSIS — Z8673 Personal history of transient ischemic attack (TIA), and cerebral infarction without residual deficits: Secondary | ICD-10-CM | POA: Insufficient documentation

## 2023-04-06 DIAGNOSIS — N200 Calculus of kidney: Secondary | ICD-10-CM | POA: Diagnosis not present

## 2023-04-06 LAB — BASIC METABOLIC PANEL
Anion gap: 8 (ref 5–15)
BUN: 16 mg/dL (ref 6–20)
CO2: 23 mmol/L (ref 22–32)
Calcium: 9.7 mg/dL (ref 8.9–10.3)
Chloride: 109 mmol/L (ref 98–111)
Creatinine, Ser: 0.97 mg/dL (ref 0.61–1.24)
GFR, Estimated: >60 mL/min (ref >60)
Glucose, Bld: 118 mg/dL — ABNORMAL HIGH (ref 70–99)
Potassium: 4 mmol/L (ref 3.5–5.1)
Sodium: 140 mmol/L (ref 135–145)

## 2023-04-06 LAB — URINALYSIS, ROUTINE W REFLEX MICROSCOPIC
Bilirubin Urine: NEGATIVE
Glucose, UA: NEGATIVE mg/dL
Ketones, ur: NEGATIVE mg/dL
Leukocytes,Ua: NEGATIVE
Nitrite: NEGATIVE
Protein, ur: NEGATIVE mg/dL
RBC / HPF: >50 RBC/hpf (ref 0–5)
Specific Gravity, Urine: 1.017 (ref 1.005–1.030)
pH: 5 (ref 5.0–8.0)

## 2023-04-06 LAB — CBC
HCT: 44.6 % (ref 39.0–52.0)
Hemoglobin: 15.5 g/dL (ref 13.0–17.0)
MCH: 31.6 pg (ref 26.0–34.0)
MCHC: 34.8 g/dL (ref 30.0–36.0)
MCV: 90.8 fL (ref 80.0–100.0)
Platelets: 209 10*3/uL (ref 150–400)
RBC: 4.91 MIL/uL (ref 4.22–5.81)
RDW: 12.2 % (ref 11.5–15.5)
WBC: 4.5 10*3/uL (ref 4.0–10.5)
nRBC: 0 % (ref 0.0–0.2)

## 2023-04-06 MED ORDER — TAMSULOSIN HCL 0.4 MG PO CAPS: 0.4000 mg | ORAL_CAPSULE | Freq: Every day | ORAL | 0 refills | Status: DC

## 2023-04-06 MED ORDER — KETOROLAC TROMETHAMINE 15 MG/ML IJ SOLN
15.0000 mg | Freq: Once | INTRAMUSCULAR | Status: AC
  Administered 2023-04-06: 15 mg via INTRAVENOUS
  Filled 2023-04-06: qty 1

## 2023-04-06 MED ORDER — OXYCODONE HCL 5 MG PO TABS: 5.0000 mg | ORAL_TABLET | ORAL | 0 refills | Status: DC | PRN

## 2023-04-06 MED ORDER — ONDANSETRON HCL 4 MG/2ML IJ SOLN
4.0000 mg | Freq: Once | INTRAMUSCULAR | Status: AC
  Administered 2023-04-06: 4 mg via INTRAVENOUS
  Filled 2023-04-06: qty 2

## 2023-04-06 MED ORDER — SODIUM CHLORIDE 0.9 % IV BOLUS
1000.0000 mL | Freq: Once | INTRAVENOUS | Status: AC
  Administered 2023-04-06: 1000 mL via INTRAVENOUS

## 2023-04-06 MED ORDER — HYDROMORPHONE HCL 1 MG/ML IJ SOLN
1.0000 mg | Freq: Once | INTRAMUSCULAR | Status: AC
  Administered 2023-04-06: 1 mg via INTRAVENOUS
  Filled 2023-04-06: qty 1

## 2023-04-06 MED ORDER — OXYCODONE-ACETAMINOPHEN 5-325 MG PO TABS
1.0000 | ORAL_TABLET | Freq: Once | ORAL | Status: AC
  Administered 2023-04-06: 1 via ORAL
  Filled 2023-04-06: qty 1

## 2023-04-06 MED ORDER — MORPHINE SULFATE (PF) 4 MG/ML IV SOLN
4.0000 mg | Freq: Once | INTRAVENOUS | Status: AC
  Administered 2023-04-06: 4 mg via INTRAVENOUS
  Filled 2023-04-06: qty 1

## 2023-04-06 NOTE — ED Provider Notes (Signed)
[facility] Emergency Department Provider Note MRN:  [id number]  Arrival date & time: 04/06/23     Chief Complaint   Flank Pain   History of Present Illness   [name] is a 52 y.o. year-old male with a history of hypertension, kidney stones presenting to the ED with chief complaint of flank pain.  Right flank pain starting yesterday similar to prior kidney stones, was really bad when he got here, not as severe right now.  Denies fever, no hematuria or dysuria, no other complaints.  Review of Systems  A thorough review of systems was obtained and all systems are negative except as noted in the HPI and PMH.   Patient's Health History    Past Medical History:  Diagnosis Date   Arthritis    neck   Carpal tunnel syndrome on both sides 08/2014   DDD (degenerative disc disease), lumbar    History of kidney stones    Hypertension    under control with med., has been on med. x 1 yr.   Lung nodule    noted on CTA in 2/22 // Chest CT WO contrast 6/22: LUL nodule resolved, ascending aorta 4.2 x 4.2 cm, cholelithiasis, R kidney stone   Sleep apnea    CPAP but does not use   Stroke (HCC) 11/16/2019   Thoracic aortic aneurysm (HCC)    MRA 5/22: Ascending thoracic aorta aneurysm 41 mm    Past Surgical History:  Procedure Laterality Date   BUBBLE STUDY  11/21/2019   Procedure: BUBBLE STUDY;  Surgeon: [name], MD;  [facility] END[facility]COPY;  Service: Cardiovascular;;   CARPAL TUNNEL RELEASE Left 09/07/2014   Procedure: LEFT CARPAL TUNNEL RELEASE;  Surgeon: [name], MD;  Location: [facility];  Service: Orthopedics;  Laterality: Left;   CARPAL TUNNEL RELEASE Right 10/12/2014   Procedure: RIGHT CARPAL TUNNEL RELEASE;  Surgeon: [name], MD;  Location: [facility];  Service: Orthopedics;  Laterality: Right;   KNEE ARTHROSCOPY Left    PATENT FORAMEN OVALE(PFO) CLOSURE N/A 01/31/2020   Procedure: PATENT FORAMEN  OVALE (PFO) CLOSURE;  Surgeon: [name], MD;  Location: [facility] INVASIVE CV LAB;  Service: Cardiovascular;  Laterality: N/A;   SUBOCCIPITAL CRANIECTOMY CERVICAL LAMINECTOMY N/A 11/17/2019   Procedure: SUBOCCIPITAL DECOMPRESSIVE CRANIECTOMY;  Surgeon: [name], MD;  Location: [facility] OR;  Service: Neurosurgery;  Laterality: N/A;   TEE WITHOUT CARDIOVERSION N/A 11/21/2019   Procedure: TRANSESOPHAGEAL ECHOCARDIOGRAM (TEE);  Surgeon: [name], MD;  Location: [facility] ENDOSCOPY;  Service: Cardiovascular;  Laterality: N/A;    Family History  Problem Relation Age of Onset   Heart attack Mother     Social History   Socioeconomic History   Marital status: Married    Spouse name: [name]   Number of children: Not on file   Years of education: Not on file   Highest education level: Not on file  Occupational History   Not on file  Tobacco Use   Smoking status: Never   Smokeless tobacco: Never  Vaping Use   Vaping status: Former  Substance and Sexual Activity   Alcohol use: Not Currently    Comment: quit 2006   Drug use: No   Sexual activity: Yes  Other Topics Concern   Not on file  Social History Narrative   Right handed    Lives with wife    Social Determinants of Health   Financial Resource Strain: Not on file  Food Insecurity: Not on file  Transportation Needs: Not on file  Physical Activity: Not on file  Stress: Not on file  Social Connections: Not on file  Intimate Partner Violence: Not on file     Physical Exam   Vitals:   04/06/23 0630 04/06/23 0645  BP: 113/69 111/72  Pulse:    Resp: (!) 8 (!) 6  Temp:    SpO2:      CONSTITUTIONAL: Well-appearing, NAD NEURO/PSYCH:  Alert and oriented x 3, no focal deficits EYES:  eyes equal and reactive ENT/NECK:  no LAD, no JVD CARDIO: Regular rate, well-perfused, normal S1 and S2 PULM:  CTAB no wheezing or rhonchi GI/GU:  non-distended, non-tender MSK/SPINE:  No gross deformities, no edema SKIN:  no rash,  atraumatic   *Additional and/or pertinent findings included in MDM below  Diagnostic and Interventional Summary    EKG Interpretation Date/Time:    Ventricular Rate:    PR Interval:    QRS Duration:    QT Interval:    QTC Calculation:   R Axis:      Text Interpretation:         Labs Reviewed  BASIC METABOLIC PANEL - Abnormal; Notable for the following components:      Result Value   Glucose, Bld 118 (*)    All other components within normal limits  CBC  URINALYSIS, ROUTINE W REFLEX MICROSCOPIC    CT Renal Stone Study  Final Result      Medications  HYDROmorphone (DILAUDID) injection 1 mg (1 mg Intravenous Given 04/06/23 0334)  ondansetron (ZOFRAN) injection 4 mg (4 mg Intravenous Given 04/06/23 0330)  sodium chloride 0.9 % bolus 1,000 mL (1,000 mLs Intravenous New Bag/Given 04/06/23 0519)  ketorolac (TORADOL) 15 MG/ML injection 15 mg (15 mg Intravenous Given 04/06/23 0519)  morphine (PF) 4 MG/ML injection 4 mg (4 mg Intravenous Given 04/06/23 0521)     Procedures  /  Critical Care Procedures  ED Course and Medical Decision Making  Initial Impression and Ddx Differential diagnosis includes kidney stone, pyelonephritis, diverticulitis, appendicitis  Past medical/surgical history that increases complexity of ED encounter: Kidney stones  Interpretation of Diagnostics I personally reviewed the laboratory assessment and my interpretation is as follows: No significant blood count or electrolyte disturbance  CT confirms 5 mm right-sided kidney stone in the proximal ureter  Patient Reassessment and Ultimate Disposition/Management     Still awaiting urinalysis to ensure no concomitant infection.  Patient is more comfortable, anticipating discharge with urology follow-up.  Signed out to oncoming provider at shift change.  Patient management required discussion with the following services or consulting groups:  None  Complexity of Problems Addressed Acute illness or  injury that poses threat of life of bodily function  Additional Data Reviewed and Analyzed Further history obtained from: None  Additional Factors Impacting ED Encounter Risk Prescriptions  Heron Corfield. Pilar Plate, MD [facility] Emergency Medicine [facility] [email or web address]  Final Clinical Impressions(s) / ED Diagnoses     ICD-10-CM   1. Right flank pain  R10.9     2. Kidney stone  N20.0       ED Discharge Orders          Ordered    tamsulosin (FLOMAX) 0.4 MG CAPS capsule  Daily        04/06/23 0706    oxyCODONE (ROXICODONE) 5 MG immediate release tablet  Every 4 hours PRN        04/06/23 0706  Discharge Instructions Discussed with and Provided to Patient:    Discharge Instructions      You were evaluated in the Emergency Department and after careful evaluation, we did not find any emergent condition requiring admission or further testing in the hospital.  Your exam/testing today was overall reassuring.  Symptoms seem to be due to a kidney stone.  Use the Flomax daily to help you pass the stone.  Recommend Tylenol 1000 mg every 4-6 hours and/or Motrin 600 mg every 4-6 hours for pain.  You can use the oxycodone medication for more significant pain.  Follow-up with urology.  Please return to the Emergency Department if you experience any worsening of your condition.  Thank you for allowing [place] to be a part of your care.       Sabas Sous, MD 04/06/23 [phone]

## 2023-04-06 NOTE — ED Notes (Signed)
Asked [name] if he could give [place] a urine sample.

## 2023-04-06 NOTE — ED Notes (Signed)
PT stated he had battled kidney stones for 20 years and that they always present like this. Pt states he had been struggling to urinate. Pt denied blood in urine. Pt states that his pain is on the R flank and is now starting to radiate around the front.

## 2023-04-06 NOTE — ED Provider Notes (Signed)
Care of patient assumed from Dr. Pilar Plate.  Patient presents with right-sided kidney stone.  Currently awaiting urinalysis. Physical Exam  BP 120/72   Pulse 70   Temp 97.9 F (36.6 C) (Oral)   Resp 18   Ht 6\' 4"  (1.93 m)   Wt (!) 152 kg   SpO2 96%   BMI 40.78 kg/m   Physical Exam Vitals and nursing note reviewed.  Constitutional:      General: He is not in acute distress.    Appearance: Normal appearance. He is well-developed. He is not ill-appearing, toxic-appearing or diaphoretic.  HENT:     Head: Normocephalic and atraumatic.     Right Ear: External ear normal.     Left Ear: External ear normal.     Nose: Nose normal.     Mouth/Throat:     Mouth: Mucous membranes are moist.  Eyes:     Extraocular Movements: Extraocular movements intact.     Conjunctiva/sclera: Conjunctivae normal.  Cardiovascular:     Rate and Rhythm: Normal rate and regular rhythm.  Pulmonary:     Effort: Pulmonary effort is normal. No respiratory distress.  Abdominal:     General: There is no distension.     Palpations: Abdomen is soft.  Musculoskeletal:        General: No swelling.     Cervical back: Normal range of motion and neck supple.  Skin:    General: Skin is warm and dry.     Coloration: Skin is not jaundiced or pale.  Neurological:     General: No focal deficit present.     Mental Status: He is alert and oriented to person, place, and time.  Psychiatric:        Mood and Affect: Mood normal.        Behavior: Behavior normal.     Procedures  Procedures  ED Course / MDM    Medical Decision Making Amount and/or Complexity of Data Reviewed Labs: ordered. Radiology: ordered.  Risk Prescription drug management.   Asthma, patient resting on ED stretcher.  He endorses some mild ongoing pain.  Dose of Percocet was ordered.  Urinalysis does not show infection.  He states that he does have nausea medicine at home.  He was discharged in stable condition.       Gloris Manchester,  MD 04/06/23 [phone]

## 2023-04-06 NOTE — ED Triage Notes (Signed)
Patient coming to ED for evaluation of sudden onset R flank pain.  Reports hx of kidney stones in the past.  States pain woke him from sleep.  Difficulty with urination.  No reports of hematuria.  +nausea.

## 2023-04-06 NOTE — Discharge Instructions (Addendum)
You were evaluated in the Emergency Department and after careful evaluation, we did not find any emergent condition requiring admission or further testing in the hospital.  Your exam/testing today was overall reassuring.  Symptoms seem to be due to a kidney stone.  Use the Flomax daily to help you pass the stone. Drink plenty of fluids.  Recommend Tylenol 1000 mg every 4-6 hours and/or Motrin 600 mg every 4-6 hours for pain.  You can use the oxycodone medication for more significant pain.  Follow-up with urology.  Please return to the Emergency Department if you experience any worsening of your condition.  Thank you for allowing [place] to be a part of your care.

## 2023-04-06 NOTE — ED Notes (Signed)
Pt still isnt ready to urinate.

## 2023-04-07 ENCOUNTER — Other Ambulatory Visit: Payer: Self-pay

## 2023-04-07 ENCOUNTER — Encounter (HOSPITAL_COMMUNITY): Payer: Self-pay

## 2023-04-07 ENCOUNTER — Emergency Department (HOSPITAL_COMMUNITY)
Admission: EM | Admit: 2023-04-07 | Discharge: 2023-04-07 | Disposition: A | Discharge status: Home / Self Care | Payer: 59 | Attending: Emergency Medicine | Admitting: Emergency Medicine

## 2023-04-07 DIAGNOSIS — R109 Unspecified abdominal pain: Secondary | ICD-10-CM | POA: Insufficient documentation

## 2023-04-07 DIAGNOSIS — Z7982 Long term (current) use of aspirin: Secondary | ICD-10-CM | POA: Diagnosis not present

## 2023-04-07 LAB — BASIC METABOLIC PANEL
Anion gap: 12 (ref 5–15)
BUN: 12 mg/dL (ref 6–20)
CO2: 20 mmol/L — ABNORMAL LOW (ref 22–32)
Calcium: 9.8 mg/dL (ref 8.9–10.3)
Chloride: 103 mmol/L (ref 98–111)
Creatinine, Ser: 1.24 mg/dL (ref 0.61–1.24)
GFR, Estimated: >60 mL/min (ref >60)
Glucose, Bld: 137 mg/dL — ABNORMAL HIGH (ref 70–99)
Potassium: 4.2 mmol/L (ref 3.5–5.1)
Sodium: 135 mmol/L (ref 135–145)

## 2023-04-07 LAB — URINALYSIS, ROUTINE W REFLEX MICROSCOPIC
Bilirubin Urine: NEGATIVE
Glucose, UA: NEGATIVE mg/dL
Ketones, ur: 20 mg/dL — AB
Leukocytes,Ua: NEGATIVE
Nitrite: NEGATIVE
Protein, ur: 30 mg/dL — AB
RBC / HPF: >50 RBC/hpf (ref 0–5)
Specific Gravity, Urine: 1.014 (ref 1.005–1.030)
pH: 6 (ref 5.0–8.0)

## 2023-04-07 LAB — CBC WITH DIFFERENTIAL/PLATELET
Abs Immature Granulocytes: 0 10*3/uL (ref 0.00–0.07)
Basophils Absolute: 0 10*3/uL (ref 0.0–0.1)
Basophils Relative: 0 %
Eosinophils Absolute: 0.1 10*3/uL (ref 0.0–0.5)
Eosinophils Relative: 1 %
HCT: 47.4 % (ref 39.0–52.0)
Hemoglobin: 16.1 g/dL (ref 13.0–17.0)
Immature Granulocytes: 0 %
Lymphocytes Relative: 23 %
Lymphs Abs: 1.5 10*3/uL (ref 0.7–4.0)
MCH: 31.3 pg (ref 26.0–34.0)
MCHC: 34 g/dL (ref 30.0–36.0)
MCV: 92.2 fL (ref 80.0–100.0)
Monocytes Absolute: 0.6 10*3/uL (ref 0.1–1.0)
Monocytes Relative: 8 %
Neutro Abs: 4.6 10*3/uL (ref 1.7–7.7)
Neutrophils Relative %: 68 %
Platelets: 225 10*3/uL (ref 150–400)
RBC: 5.14 MIL/uL (ref 4.22–5.81)
RDW: 12.3 % (ref 11.5–15.5)
WBC: 6.8 10*3/uL (ref 4.0–10.5)
nRBC: 0 % (ref 0.0–0.2)

## 2023-04-07 MED ORDER — KETOROLAC TROMETHAMINE 15 MG/ML IJ SOLN
INTRAMUSCULAR | Status: AC
  Filled 2023-04-07: qty 1

## 2023-04-07 MED ORDER — FENTANYL CITRATE PF 50 MCG/ML IJ SOSY
50.0000 ug | PREFILLED_SYRINGE | Freq: Once | INTRAMUSCULAR | Status: AC
  Administered 2023-04-07: 50 ug via INTRAVENOUS
  Filled 2023-04-07: qty 1

## 2023-04-07 MED ORDER — MAGNESIUM SULFATE 2 GM/50ML IV SOLN
2.0000 g | Freq: Once | INTRAVENOUS | Status: AC
  Administered 2023-04-07: 2 g via INTRAVENOUS
  Filled 2023-04-07: qty 50

## 2023-04-07 MED ORDER — OXYCODONE HCL 5 MG PO TABS: 5.0000 mg | ORAL_TABLET | ORAL | 0 refills | Status: DC | PRN

## 2023-04-07 MED ORDER — ONDANSETRON HCL 4 MG/2ML IJ SOLN
4.0000 mg | Freq: Once | INTRAMUSCULAR | Status: AC
  Administered 2023-04-07: 4 mg via INTRAVENOUS
  Filled 2023-04-07: qty 2

## 2023-04-07 MED ORDER — DROPERIDOL 2.5 MG/ML IJ SOLN: 1.2500 mg | Freq: Once | INTRAMUSCULAR | Status: DC

## 2023-04-07 MED ORDER — KETOROLAC TROMETHAMINE 15 MG/ML IJ SOLN
30.0000 mg | Freq: Once | INTRAMUSCULAR | Status: AC
  Administered 2023-04-07: 30 mg via INTRAVENOUS
  Filled 2023-04-07: qty 2

## 2023-04-07 NOTE — ED Provider Notes (Signed)
[place] EMERGENCY DEPARTMENT AT [facility] Provider Note   CSN: [id number] Arrival date & time: 04/07/23  4098     History  Chief Complaint  Patient presents with   Flank Pain   HPI [name] is a 52 y.o. male presenting right flank stone. Started again yesterday evening and has progressively worse this morning.  Was diagnosed yesterday with a 5 mm right-sided kidney stone in the proximal ureter. States that the pain is still in the right flank but now radiating to the right lower quadrant and right groin.  Denies any urinary changes at this time. Denies nausea vomiting diarrhea. Denies fever. States the pain is "really bad". Has been taking the Flomax and oxycodone.   Flank Pain       Home Medications Prior to Admission medications   Medication Sig Start Date End Date Taking? Authorizing Provider  aspirin EC 81 MG EC tablet Take 1 tablet (81 mg total) by mouth daily. Swallow whole. 11/23/19   Layne Benton, NP  clonazePAM (KLONOPIN) 0.5 MG tablet Take 1 tablet (0.5 mg total) by mouth 2 (two) times daily as needed for anxiety (anxiety, sleep, dizziness). 10/15/20   Ihor Austin, NP  DULoxetine (CYMBALTA) 30 MG capsule Take 30 mg by mouth daily. For 7 days.    [provider]  DULoxetine (CYMBALTA) 60 MG capsule Take 60 mg by mouth daily. 03/26/23   [provider]  Evolocumab (REPATHA SURECLICK) 140 MG/ML SOAJ Inject 1 Pen into the skin every 14 (fourteen) days. 11/24/21   [name], MD  gabapentin (NEURONTIN) 100 MG capsule Take 100 mg by mouth daily.    [provider]  ibuprofen (ADVIL) 200 MG tablet Take 200 mg by mouth every 6 (six) hours as needed for mild pain.    [provider]  losartan (COZAAR) 100 MG tablet Take 100 mg by mouth daily.    [provider]  losartan (COZAAR) 50 MG tablet Take 50 mg by mouth daily.    [provider]  metFORMIN (GLUCOPHAGE) 500 MG tablet Take 1,000 mg by mouth  daily.    [provider]  oxyCODONE (ROXICODONE) 5 MG immediate release tablet Take 1 tablet (5 mg total) by mouth every 4 (four) hours as needed for severe pain (pain score 7-10). 04/07/23   Gareth Eagle, [name]  sertraline (ZOLOFT) 50 MG tablet Take 50 mg by mouth daily. 01/15/22   [provider]  tamsulosin (FLOMAX) 0.4 MG CAPS capsule Take 1 capsule (0.4 mg total) by mouth daily. 04/06/23   Sabas Sous, MD      Allergies    Patient has no known allergies.    Review of Systems   Review of Systems  Genitourinary:  Positive for flank pain.    Physical Exam Updated Vital Signs BP 126/77   Pulse 73   Temp 98.3 F (36.8 C) (Oral)   Resp 12   SpO2 99%  Physical Exam Vitals and nursing note reviewed.  HENT:     Head: Normocephalic and atraumatic.     Mouth/Throat:     Mouth: Mucous membranes are moist.  Eyes:     General:        Right eye: No discharge.        Left eye: No discharge.     Conjunctiva/sclera: Conjunctivae normal.  Cardiovascular:     Rate and Rhythm: Normal rate and regular rhythm.     Pulses: Normal pulses.  Heart sounds: Normal heart sounds.  Pulmonary:     Effort: Pulmonary effort is normal.     Breath sounds: Normal breath sounds.  Abdominal:     General: Abdomen is flat.     Palpations: Abdomen is soft.     Tenderness: There is right CVA tenderness.  Skin:    General: Skin is warm and dry.  Neurological:     General: No focal deficit present.  Psychiatric:        Mood and Affect: Mood normal.     ED Results / Procedures / Treatments   Labs (all labs ordered are listed, but only abnormal results are displayed) Labs Reviewed  BASIC METABOLIC PANEL - Abnormal; Notable for the following components:      Result Value   CO2 20 (*)    Glucose, Bld 137 (*)    All other components within normal limits  URINALYSIS, ROUTINE W REFLEX MICROSCOPIC - Abnormal; Notable for the following components:   APPearance HAZY (*)     Hgb urine dipstick LARGE (*)    Ketones, ur 20 (*)    Protein, ur 30 (*)    Bacteria, UA RARE (*)    All other components within normal limits  CBC WITH DIFFERENTIAL/PLATELET    EKG EKG Interpretation Date/Time:  Wednesday April 07 2023 07:02:27 EST Ventricular Rate:  71 PR Interval:  188 QRS Duration:  119 QT Interval:  400 QTC Calculation: 435 R Axis:   23  Text Interpretation: Sinus rhythm Nonspecific intraventricular conduction delay No significant change since prior 6/23 Confirmed by Meridee Score [phone]) on 04/07/2023 7:04:11 AM  Radiology CT Renal Stone Study  Result Date: 04/06/2023 CLINICAL DATA:  Abdominal pain.  Kidney stones suspected. EXAM: CT ABDOMEN AND PELVIS WITHOUT CONTRAST TECHNIQUE: Multidetector CT imaging of the abdomen and pelvis was performed following the standard protocol without IV contrast. RADIATION DOSE REDUCTION: This exam was performed according to the departmental dose-optimization program which includes automated exposure control, adjustment of the mA and/or kV according to patient size and/or use of iterative reconstruction technique. COMPARISON:  08/06/2021 FINDINGS: Lower chest: No acute abnormality. Hepatobiliary: No focal liver abnormality. Tiny stones layer within the dependent portion of the gallbladder. No gallbladder wall thickening or inflammation. No signs of bile duct dilatation. Pancreas: Unremarkable. No pancreatic ductal dilatation or surrounding inflammatory changes. Spleen: Normal in size without focal abnormality. Adrenals/Urinary Tract: Normal adrenal glands. Multiple upper and lower pole calculi are noted within both kidneys. These stones measure up to 4 mm. Right-sided pelvocaliectasis is identified. Within the proximal right ureter there is a stone measuring 5 mm, image 44/3. No left-sided hydronephrosis or ureteral calculi. Urinary bladder appears normal. Stomach/Bowel: Stomach is normal. The appendix is visualized and appears  normal. Sigmoid diverticula noted without signs of acute diverticulitis. Vascular/Lymphatic: Mild aortic atherosclerosis. No signs of abdominopelvic adenopathy. Reproductive: Prostate is unremarkable. Other: No ascites or fluid collections. Small fat containing umbilical hernia.6 no signs of pneumoperitoneum. Musculoskeletal: Lumbar degenerative disc disease. No acute or suspicious osseous findings. IMPRESSION: 1. There is a 5 mm stone within the proximal right ureter with associated right-sided pelvocaliectasis. 2. Multiple bilateral renal calculi. 3. Cholelithiasis. 4. Sigmoid diverticulosis without signs of acute diverticulitis. 5.  Aortic Atherosclerosis (ICD10-I70.0). Electronically Signed   By: Signa Kell M.D.   On: 04/06/2023 06:04    Procedures Procedures    Medications Ordered in ED Medications  droperidol (INAPSINE) 2.5 MG/ML injection 1.25 mg (0 mg Intravenous Hold 04/07/23 0902)  ondansetron (ZOFRAN) injection  4 mg (4 mg Intravenous Given 04/07/23 0540)  fentaNYL (SUBLIMAZE) injection 50 mcg (50 mcg Intravenous Given 04/07/23 0540)  ketorolac (TORADOL) 15 MG/ML injection 30 mg (30 mg Intravenous Given 04/07/23 0713)  magnesium sulfate IVPB 2 g 50 mL (0 g Intravenous Stopped 04/07/23 0828)  fentaNYL (SUBLIMAZE) injection 50 mcg (50 mcg Intravenous Given 04/07/23 3762)    ED Course/ Medical Decision Making/ A&P                                 Medical Decision Making Risk Prescription drug management.   52 year old well-appearing male presenting for right flank pain.  Exam notable for right CVA tenderness.  DDx includes kidney stone, pyelonephritis, UTI, septic stone, acute cholecystitis, other.  CT from yesterday and symptoms along with clinical findings suggestive of pain related to known kidney stone.  My suspicion given that the pain is starting to radiate down into his lower abdomen that the stone may be moving towards the bladder.  Workup today does not suggest associated  infection or renal dysfunction.  After treatment patient stated that his pain was "100% better".  Advised him to follow-up with urology.  Sent a few more tablets of oxycodone to his pharmacy advised to continue taking the Flomax and that he could also use Tylenol ibuprofen treatment as well.  Discussed pertinent return precautions.  Vital stable during this encounter.  Discharged in good condition.        Final Clinical Impression(s) / ED Diagnoses Final diagnoses:  Flank pain    Rx / DC Orders ED Discharge Orders          Ordered    oxyCODONE (ROXICODONE) 5 MG immediate release tablet  Every 4 hours PRN        04/07/23 0916              Gareth Eagle, [name] 04/07/23 8315    Gloris Manchester, MD 04/10/23 [phone]

## 2023-04-07 NOTE — ED Triage Notes (Signed)
Seen yesterday and dx with 5mm renal stone tonight side. Says pain was managed and sent home with prescriptions.   Pain intensified this morning and pain medication not improving symptoms.

## 2023-04-07 NOTE — Discharge Instructions (Addendum)
Today revealed that you are likely having pain secondary to your kidney stone.  Given that the pain is moving lower your abdomen it is likely that the stone is moving closer to the bladder which is ultimately a good thing.  Do recommend that you follow-up with urology.  If you have any painful urination, develop a fever or malodorous urine or flank pain is uncontrollable with medication at home please return to the emergency department further evaluation.

## 2023-04-08 ENCOUNTER — Other Ambulatory Visit: Payer: Self-pay | Admitting: Urology

## 2023-04-09 ENCOUNTER — Encounter (HOSPITAL_BASED_OUTPATIENT_CLINIC_OR_DEPARTMENT_OTHER): Payer: Self-pay | Admitting: Urology

## 2023-04-09 NOTE — Progress Notes (Signed)
Spoke w/ via phone for pre-op interview--- [name]" Lab needs dos----             n/a  COVID test -----patient states asymptomatic no test needed Arrive at ------- 0915 NPO after MN NO Solid Food.  Clear liquids from MN until--- sip of water with pills only Med rec completed. Pt aware to hold ASA/NSAIDs and supplements per [facility] protocol.  Medications to take morning of surgery ----- losartan, oxycodone prn Diabetic/Weight loss medication ----- Per pt, Has not started metformin yet No Alcohol or recreational drugs for 24 hours/Tobacco products for 6 hours ---- n/a Patient instructed to bring blue lithotripsy folder, photo id and insurance card day of surgery. Patient aware to have Driver (ride ) / caregiver for 24 hours after surgery ----- [name] (spouse) Patient Special Instructions ----- Bring CPAP Pre-Op special Instructions ----- take laxative of choice day before procedure Patient verbalized understanding of instructions that were given at this phone interview. Patient denies shortness of breath, chest pain, fever, cough at this phone interview.

## 2023-04-09 NOTE — Progress Notes (Signed)
Left voicemail for Patient to return call for instructions at [phone].

## 2023-04-12 ENCOUNTER — Ambulatory Visit (HOSPITAL_BASED_OUTPATIENT_CLINIC_OR_DEPARTMENT_OTHER): Admission: RE | Admit: 2023-04-12 | Payer: 59 | Source: Home / Self Care | Admitting: Urology

## 2023-04-12 HISTORY — DX: Prediabetes: R73.03

## 2023-04-12 SURGERY — LITHOTRIPSY, ESWL
Anesthesia: LOCAL | Laterality: Right

## 2023-06-11 ENCOUNTER — Encounter: Payer: Self-pay | Admitting: Cardiovascular Disease

## 2023-06-11 ENCOUNTER — Ambulatory Visit: Payer: 59 | Attending: Cardiovascular Disease | Admitting: Cardiovascular Disease

## 2023-06-11 VITALS — BP 120/90 | HR 69 | Ht 75.0 in | Wt 334.6 lb

## 2023-06-11 DIAGNOSIS — I10 Essential (primary) hypertension: Secondary | ICD-10-CM | POA: Diagnosis not present

## 2023-06-11 DIAGNOSIS — I712 Thoracic aortic aneurysm, without rupture, unspecified: Secondary | ICD-10-CM

## 2023-06-11 DIAGNOSIS — E782 Mixed hyperlipidemia: Secondary | ICD-10-CM

## 2023-06-11 DIAGNOSIS — Q2112 Patent foramen ovale: Secondary | ICD-10-CM

## 2023-06-11 MED ORDER — LOSARTAN POTASSIUM 100 MG PO TABS: 100.0000 mg | ORAL_TABLET | Freq: Every day | ORAL | 3 refills | Status: AC

## 2023-06-11 NOTE — Assessment & Plan Note (Signed)
Reviewed Dr. Sharee Pimple note.  The patient has a 4.2 cm ascending aortic aneurysm with plans for surveillance in 2-year intervals.  We discussed the importance of good blood pressure control.

## 2023-06-11 NOTE — Patient Instructions (Signed)
Follow-Up: At [facility], you and your health needs are our priority.  As part of our continuing mission to provide you with exceptional heart care, we have created designated Provider Care Teams.  These Care Teams include your primary Cardiologist (physician) and Advanced Practice Providers (APPs -  Physician Assistants and Nurse Practitioners) who all work together to provide you with the care you need, when you need it.  Your next appointment:   1 year(s)  Provider:   [name], MD        1st Floor: - Lobby - Registration  - Pharmacy  - Lab - Cafe  2nd Floor: - PV Lab - Diagnostic Testing (echo, CT, nuclear med)  3rd Floor: - Vacant  4th Floor: - TCTS (cardiothoracic surgery) - AFib Clinic - Structural Heart Clinic - Vascular Surgery  - Vascular Ultrasound  5th Floor: - HeartCare Cardiology (general and EP) - Clinical Pharmacy for coumadin, hypertension, lipid, weight-loss medications, and med management appointments    Valet parking services will be available as well.

## 2023-06-11 NOTE — Progress Notes (Signed)
Cardiology Office Note:    Date:  06/11/2023   ID:  Henry, [name] January 06, 1971, MRN [id number]  PCP:  Henry Hazel, MD   [facility] Providers Cardiologist:  Henry Bollman, MD     Referring MD: Henry Hazel, MD   Chief Complaint  Patient presents with   Follow-up    PFO s/p closure    History of Present Illness:    [name] is a 53 y.o. 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 He's doing well. 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 He is statin intolerant and has been prescribed Repatha but hasn't started it yet. He is going to his pharmacy to get started on this medicine. He reports that he had problems with kidney stones in December 2024 and he passed them on his own without intervention. Reports that his home BP's run 120/s over low 80's.  He reports no chest pain, chest pressure, or shortness of breath.  He has a lot of problems with arthritis.  Current Medications: Current Meds  Medication Sig   DULoxetine (CYMBALTA) 60 MG capsule Take 60 mg by mouth daily.   metFORMIN (GLUCOPHAGE) 500 MG tablet Take 1,000 mg by mouth daily. Has not started. For pre-diabetes   [DISCONTINUED] aspirin EC 81 MG EC tablet Take 1 tablet (81 mg total) by mouth daily. Swallow whole.   [DISCONTINUED] clonazePAM (KLONOPIN) 0.5 MG tablet Take 1 tablet (0.5 mg total) by mouth 2 (two) times daily as needed for anxiety (anxiety, sleep, dizziness).   [DISCONTINUED] DULoxetine (CYMBALTA) 30 MG capsule Take 30 mg by mouth daily. For 7 days.   [DISCONTINUED] Evolocumab (REPATHA SURECLICK) 140 MG/ML SOAJ Inject 1 Pen into the skin every 14 (fourteen) days.   [DISCONTINUED] gabapentin (NEURONTIN) 100 MG capsule Take 100 mg by mouth daily.   [DISCONTINUED] ibuprofen (ADVIL) 200  MG tablet Take 200 mg by mouth every 6 (six) hours as needed for mild pain.   [DISCONTINUED] losartan (COZAAR) 100 MG tablet Take 100 mg by mouth daily.   [DISCONTINUED] losartan (COZAAR) 50 MG tablet Take 50 mg by mouth daily.   [DISCONTINUED] oxyCODONE (ROXICODONE) 5 MG immediate release tablet Take 1 tablet (5 mg total) by mouth every 4 (four) hours as needed for severe pain (pain score 7-10).   [DISCONTINUED] sertraline (ZOLOFT) 50 MG tablet Take 50 mg by mouth daily.   [DISCONTINUED] tamsulosin (FLOMAX) 0.4 MG CAPS capsule Take 1 capsule (0.4 mg total) by mouth daily.     Allergies:   Patient has no known allergies.   ROS:   Please see the history of present illness.    All other systems reviewed and are negative.  EKGs/Labs/Other Studies Reviewed:    The following studies were reviewed today: Cardiac Studies & Procedures   CARDIAC CATHETERIZATION  CARDIAC CATHETERIZATION 01/31/2020  Narrative Successful transcatheter PFO closure using a 25 mm Amplatzer PFO Occluder under ICE and fluoroscopic guidance    ECHOCARDIOGRAM  ECHOCARDIOGRAM COMPLETE BUBBLE STUDY 02/26/2021  Narrative ECHOCARDIOGRAM REPORT    Patient Name:   [name] Date of Exam: 02/26/2021 Medical Rec #:  [id number]      Height:       75.0 in Accession #:    [id number]     Weight:  325.0 lb Date of Birth:  1970/09/28      BSA:          2.697 m Patient Age:    50 years       BP:           136/97 mmHg Patient Gender: M              HR:           79 bpm. Exam Location:  [address]  Procedure: 2D Echo, Cardiac Doppler, Color Doppler and Saline Contrast Bubble Study  Indications:    Z87.74 s/p PFO closure  History:        Patient has no prior history of Echocardiogram examinations and Patient has prior history of Echocardiogram examinations, most recent 07/25/2020. Stroke, Signs/Symptoms:Palpitations; Risk Factors:Hypertension, HLD and Sleep Apnea.  Sonographer:    Clearence Ped  RCS Referring Phys: [id number] [name]  IMPRESSIONS   1. Left ventricular ejection fraction, by estimation, is 60 to 65%. The left ventricle has normal function. The left ventricle has no regional wall motion abnormalities. There is mild left ventricular hypertrophy of the basal-septal segment. Left ventricular diastolic parameters were normal. 2. Right ventricular systolic function is normal. The right ventricular size is normal. There is normal pulmonary artery systolic pressure. 3. Left atrial size was mildly dilated. 4. Right atrial size was mildly dilated. 5. The mitral valve is normal in structure. Trivial mitral valve regurgitation. No evidence of mitral stenosis. 6. The aortic valve is normal in structure. There is mild calcification of the aortic valve. Aortic valve regurgitation is not visualized. No aortic stenosis is present. 7. Aortic dilatation noted. There is moderate dilatation of the aortic root, measuring 46 mm. There is moderate dilatation of the ascending aorta, measuring 44 mm. 8. The inferior vena cava is normal in size with greater than 50% respiratory variability, suggesting right atrial pressure of 3 mmHg. 9. Agitated saline contrast bubble study was negative, with no evidence of any interatrial shunt.  FINDINGS Left Ventricle: Left ventricular ejection fraction, by estimation, is 60 to 65%. The left ventricle has normal function. The left ventricle has no regional wall motion abnormalities. The left ventricular internal cavity size was normal in size. There is mild left ventricular hypertrophy of the basal-septal segment. Left ventricular diastolic parameters were normal.  Right Ventricle: The right ventricular size is normal. No increase in right ventricular wall thickness. Right ventricular systolic function is normal. There is normal pulmonary artery systolic pressure. The tricuspid regurgitant velocity is 1.78 m/s, and with an assumed right atrial pressure  of 3 mmHg, the estimated right ventricular systolic pressure is 15.7 mmHg.  Left Atrium: Left atrial size was mildly dilated.  Right Atrium: Right atrial size was mildly dilated.  Pericardium: There is no evidence of pericardial effusion.  Mitral Valve: The mitral valve is normal in structure. Trivial mitral valve regurgitation. No evidence of mitral valve stenosis.  Tricuspid Valve: The tricuspid valve is normal in structure. Tricuspid valve regurgitation is trivial. No evidence of tricuspid stenosis.  Aortic Valve: The aortic valve is normal in structure. There is mild calcification of the aortic valve. Aortic valve regurgitation is not visualized. No aortic stenosis is present.  Pulmonic Valve: The pulmonic valve was normal in structure. Pulmonic valve regurgitation is trivial. No evidence of pulmonic stenosis.  Aorta: The aortic root is normal in size and structure and aortic dilatation noted. There is moderate dilatation of the aortic root, measuring 46 mm. There is moderate  dilatation of the ascending aorta, measuring 44 mm.  Venous: The inferior vena cava is normal in size with greater than 50% respiratory variability, suggesting right atrial pressure of 3 mmHg.  IAS/Shunts: No atrial level shunt detected by color flow Doppler. Agitated saline contrast was given intravenously to evaluate for intracardiac shunting. Agitated saline contrast bubble study was negative, with no evidence of any interatrial shunt.   LEFT VENTRICLE PLAX 2D LVIDd:         5.10 cm   Diastology LVIDs:         3.20 cm   LV e' medial:    6.64 cm/s LV PW:         0.90 cm   LV E/e' medial:  9.0 LV IVS:        1.40 cm   LV e' lateral:   10.90 cm/s LVOT diam:     2.60 cm   LV E/e' lateral: 5.5 LV SV:         104 LV SV Index:   39 LVOT Area:     5.31 cm   RIGHT VENTRICLE RV Basal diam:  3.70 cm RV S prime:     13.10 cm/s TAPSE (M-mode): 2.2 cm RVSP:           15.7 mmHg  LEFT ATRIUM             Index         RIGHT ATRIUM           Index LA diam:        3.50 cm 1.30 cm/m   RA Pressure: 3.00 mmHg LA Vol (A2C):   80.0 ml 29.66 ml/m  RA Area:     18.90 cm LA Vol (A4C):   42.5 ml 15.76 ml/m  RA Volume:   52.00 ml  19.28 ml/m LA Biplane Vol: 61.7 ml 22.87 ml/m AORTIC VALVE LVOT Vmax:   105.00 cm/s LVOT Vmean:  69.200 cm/s LVOT VTI:    0.196 m  AORTA Ao Root diam: 4.60 cm Ao Asc diam:  4.40 cm  MITRAL VALVE               TRICUSPID VALVE MV Area (PHT):             TR Peak grad:   12.7 mmHg MV Decel Time:             TR Vmax:        178.00 cm/s MV E velocity: 59.70 cm/s  Estimated RAP:  3.00 mmHg MV A velocity: 73.90 cm/s  RVSP:           15.7 mmHg MV E/A ratio:  0.81 SHUNTS Systemic VTI:  0.20 m Systemic Diam: 2.60 cm  Arvilla Meres MD Electronically signed by Arvilla Meres MD Signature Date/Time: 02/26/2021/3:16:11 PM    Final  TEE  ECHO TEE 11/21/2019  Narrative TRANSESOPHOGEAL ECHO REPORT    Patient Name:   MARICO BUCKLE Date of Exam: 11/21/2019 Medical Rec #:  [id number]      Height:       75.0 in Accession #:    [id number]     Weight:       325.0 lb Date of Birth:  November 26, 1970      BSA:          2.697 m Patient Age:    48 years       BP:           157/106 mmHg Patient Gender: M  HR:           73 bpm. Exam Location:  Inpatient  Procedure: Transesophageal Echo, Cardiac Doppler and Color Doppler  Indications:     Stroke  History:         Patient has prior history of Echocardiogram examinations, most recent 11/17/2019.  Sonographer:     Margreta Journey Referring Phys:  [id number] LAURA R INGOLD Diagnosing Phys: [name] MD  PROCEDURE: After discussion of the risks and benefits of a TEE, an informed consent was obtained from the patient. The transesophogeal probe was passed without difficulty through the esophogus of the patient. Local oropharyngeal anesthetic was provided with Cetacaine. Sedation performed by different physician. Image  quality was good. The patient developed no complications during the procedure.  IMPRESSIONS   1. Left ventricular ejection fraction, by estimation, is 60 to 65%. The left ventricle has normal function. The left ventricle has no regional wall motion abnormalities. 2. Right ventricular systolic function is normal. The right ventricular size is normal. 3. No left atrial/left atrial appendage thrombus was detected. 4. The mitral valve is normal in structure. Trivial mitral valve regurgitation. No evidence of mitral stenosis. 5. The aortic valve is tricuspid. Aortic valve regurgitation is trivial. No aortic stenosis is present. 6. There is mild (Grade II) plaque involving the descending aorta. 7. No shunt seen by color flow doppler. Agitated saline contrast bubble study was positive with shunting observed within 3-6 cardiac cycles suggestive of interatrial shunt.  Conclusion(s)/Recommendation(s): Study positive for intra-atrial flow communication (positive bubble study). Highly mobile intra-atrial septum, but no clear PFO or ASD color flow seen despite multiple angles/windows. Recommend consideration of additional outpatient imaging (cardiac MRI vs. Cardiac CT) to further evaluation anatomy prior to consideration of transcatheter closure.  FINDINGS Left Ventricle: Left ventricular ejection fraction, by estimation, is 60 to 65%. The left ventricle has normal function. The left ventricle has no regional wall motion abnormalities. The left ventricular internal cavity size was normal in size. There is no left ventricular hypertrophy.  Right Ventricle: The right ventricular size is normal. No increase in right ventricular wall thickness. Right ventricular systolic function is normal.  Left Atrium: Left atrial size was not well visualized. No left atrial/left atrial appendage thrombus was detected.  Right Atrium: Right atrial size was not well visualized.  Pericardium: There is no evidence of  pericardial effusion.  Mitral Valve: The mitral valve is normal in structure. Trivial mitral valve regurgitation. No evidence of mitral valve stenosis. There is no evidence of mitral valve vegetation.  Tricuspid Valve: The tricuspid valve is normal in structure. Tricuspid valve regurgitation is trivial. No evidence of tricuspid stenosis. There is no evidence of tricuspid valve vegetation.  Aortic Valve: Trivial regurgitation seen at Aortic aspect of RCC-NCC commissure. The aortic valve is tricuspid. Aortic valve regurgitation is trivial. No aortic stenosis is present. There is no evidence of aortic valve vegetation.  Pulmonic Valve: The pulmonic valve was normal in structure. Pulmonic valve regurgitation is trivial. There is no evidence of pulmonic valve vegetation.  Aorta: The aortic root is normal in size and structure. There is mild (Grade II) plaque involving the descending aorta.  IAS/Shunts: The interatrial septum is aneurysmal. No shunt seen by color flow doppler. Agitated saline contrast was given intravenously to evaluate for intracardiac shunting. Agitated saline contrast bubble study was positive with shunting observed within 3-6 cardiac cycles suggestive of interatrial shunt.  [name] MD Electronically signed by [name] MD Signature Date/Time: 11/21/2019/4:36:07 PM  Final   CT SCANS  CT CORONARY MORPH W/CTA COR W/SCORE 07/05/2020  Addendum 07/05/2020  6:51 PM ADDENDUM REPORT: 07/05/2020 18:49  CLINICAL DATA:  72M with hypertension, hyperlipidemia, CVA, PFO s/p closure, family history of premature CAD, morbid obesity and OSA with chest pain.  EXAM: Cardiac/Coronary  CT  TECHNIQUE: The patient was scanned on a [employer].  FINDINGS: A 120 kV prospective scan was triggered in the descending thoracic aorta at 111 HU's. Axial non-contrast 3 mm slices were carried out through the heart. The data set was analyzed on a dedicated  work station and scored using the Agatson method. Gantry rotation speed was 250 msecs and collimation was .6 mm. No beta blockade and 0.8 mg of sl NTG was given. The 3D data set was reconstructed in 5% intervals of the 67-82 % of the R-R cycle. Diastolic phases were analyzed on a dedicated work station using MPR, MIP and VRT modes. The patient received 80 cc of contrast.  Aorta: Ascending aorta mildly dilated. 4.1 cm. Minimal calcification of the aortic arch. No dissection.  Aortic Valve:  Trileaflet.  Minimal calcification.  Coronary Arteries:  Normal coronary origin.  Right dominance.  RCA is a large dominant artery that gives rise to PDA and PLVB. There is trivial (<25%) calcified plaque in the mid and distal RCA.  Left main is a large artery that gives rise to LAD and LCX arteries.  LAD is a large vessel that has minimal (<25%) calcified plaque proximally.  LCX is a large, non-dominant artery that gives rise to a tiny OM1 branch and a large OM2. There is trivial (<25%) calcified plaque in the proximal and mid LCX. OM2 has trivial plaque.  Other findings:  Normal pulmonary vein drainage into the left atrium.  Normal let atrial appendage without a thrombus.  Normal size of the pulmonary artery.  There is an Amplatzer PFO occlusion device in the interatrial septum.  IMPRESSION: 1. Coronary calcium score of 336. This was 98th percentile for age and sex matched control.  2. Normal coronary origin with right dominance.  3. There is minimal (<25%) calcified plaque in the LAD, LCX, and RCA arteries. CAD RADS1.  4.  Consider non-cardiac causes of chest pain.  5. Recommend aggressive risk factor modification including high-potency statin.  Chilton Si, MD   Electronically Signed By: Chilton Si On: 07/05/2020 18:49  Narrative EXAM: OVER-READ INTERPRETATION  CT CHEST  The following report is an over-read performed by radiologist Dr. Donzetta Kohut of  [facility] on 07/05/2020. This over-read does not include interpretation of cardiac or coronary anatomy or pathology. The coronary calcium score/coronary CTA interpretation by the cardiologist is attached.  COMPARISON:  None aside from abdominal CTs from 2021 and chest x-rays.  FINDINGS: Vascular: See dedicated report for coronary CT a and cardiovascular structures. Note of atrial septal closure device seen on chest x-ray.  Mediastinum/Nodes: Limited imaging of the mediastinum without signs of adenopathy. Esophagus grossly normal.  Lungs/Pleura:  Multifocal areas of nodularity particularly in the LEFT upper lobe. (Image 9 of series 12 with 4 mm ill-defined nodule. Also small ill-defined nodule on image 18 of series 12 and images 7 and 8 of series 12. Other areas of ground-glass, subtle in the LEFT upper lobe. The visualized airways are patent. No sign of pleural effusion. Small nodule in the RIGHT chest, 3-4 mm, along the minor fissure on image 11 of series 12. Basilar atelectasis. No consolidation. No pleural effusion.  Upper Abdomen: Hepatic  steatosis. At least moderate. Limited imaging of upper abdominal contents is otherwise unremarkable.  Musculoskeletal: No acute musculoskeletal process.  IMPRESSION: 1. Multifocal areas of nodularity particularly in the LEFT upper lobe, with a few ill-defined nodules. Findings are nonspecific but could be seen in the setting of viral or atypical pneumonia. Given nodular appearance would suggest follow-up in 3 months to ensure resolution. 2. Hepatic steatosis. 3. See dedicated report for coronary CT a and cardiovascular structures.  Electronically Signed: By: Donzetta Kohut M.D. On: 07/05/2020 17:36          EKG:        Recent Labs: 04/07/2023: BUN 12; Creatinine, Ser 1.24; Hemoglobin 16.1; Platelets 225; Potassium 4.2; Sodium 135  Recent Lipid Panel    Component Value Date/Time   CHOL 158 07/04/2021 0837    TRIG 89 07/04/2021 0837   HDL 39 (L) 07/04/2021 0837   CHOLHDL 4.1 07/04/2021 0837   CHOLHDL 4.5 11/16/2019 1623   VLDL 7 11/16/2019 1623   LDLCALC 102 (H) 07/04/2021 0837     Risk Assessment/Calculations:      HYPERTENSION CONTROL Vitals:   06/11/23 0935 06/11/23 0959  BP: (!) 120/96 (!) 120/90    The patient's blood pressure is elevated above target today.  In order to address the patient's elevated BP: Blood pressure will be monitored at home to determine if medication changes need to be made.            Physical Exam:    VS:  BP (!) 120/90   Pulse 69   Ht 6\' 3"  (1.905 m)   Wt (!) 334 lb 9.6 oz (151.8 kg)   SpO2 97%   BMI 41.82 kg/m     Wt Readings from Last 3 Encounters:  06/11/23 (!) 334 lb 9.6 oz (151.8 kg)  04/06/23 (!) 335 lb (152 kg)  10/14/22 (!) 335 lb (152 kg)     GEN:  Well nourished, well developed in no acute distress HEENT: Normal NECK: No JVD; No carotid bruits LYMPHATICS: No lymphadenopathy CARDIAC: RRR, no murmurs, rubs, gallops RESPIRATORY:  Clear to auscultation without rales, wheezing or rhonchi  ABDOMEN: Soft, non-tender, non-distended MUSCULOSKELETAL:  No edema; No deformity  SKIN: Warm and dry NEUROLOGIC:  Alert and oriented x 3 PSYCHIATRIC:  Normal affect   Assessment & Plan PFO (patent foramen ovale) Status post transcatheter closure.  Follow-up imaging shows device intact and no residual shunt. Thoracic aortic aneurysm without rupture, unspecified part [facility]) Reviewed Dr. Sharee Pimple note.  The patient has a 4.2 cm ascending aortic aneurysm with plans for surveillance in 2-year intervals.  We discussed the importance of good blood pressure control. Essential hypertension Reports blood pressures consistently in the 120s systolic and less than 85 diastolic.  On my repeat today's blood pressure is 120/90 mmHg.  I asked him to monitor his blood pressure on an ongoing basis.  He continues on losartan.  I would like to see his blood  pressure less than 125/80 mmHg if possible in the context of his prior stroke and ascending aortic aneurysm. Mixed hyperlipidemia I reviewed his lipids and his LDL cholesterol is 207.  He will be starting Repatha.  He is statin intolerant.  We discussed the importance of treating his hyperlipidemia and he plans to go to the pharmacy to get his Repatha prescription.  He states that he is comfortable with the injections.            Medication Adjustments/Labs and Tests Ordered: Current medicines are reviewed at  length with the patient today.  Concerns regarding medicines are outlined above.  No orders of the defined types were placed in this encounter.  Meds ordered this encounter  Medications   losartan (COZAAR) 100 MG tablet    Sig: Take 1 tablet (100 mg total) by mouth daily.    Dispense:  90 tablet    Refill:  3    Patient Instructions   Follow-Up: At [facility], you and your health needs are our priority.  As part of our continuing mission to provide you with exceptional heart care, we have created designated Provider Care Teams.  These Care Teams include your primary Cardiologist (physician) and Advanced Practice Providers (APPs -  Physician Assistants and Nurse Practitioners) who all work together to provide you with the care you need, when you need it.  Your next appointment:   1 year(s)  Provider:   [name], MD       1st Floor: - Lobby - Registration  - Pharmacy  - Lab - Cafe  2nd Floor: - PV Lab - Diagnostic Testing (echo, CT, nuclear med)  3rd Floor: - Vacant  4th Floor: - TCTS (cardiothoracic surgery) - AFib Clinic - Structural Heart Clinic - Vascular Surgery  - Vascular Ultrasound  5th Floor: - HeartCare Cardiology (general and EP) - Clinical Pharmacy for coumadin, hypertension, lipid, weight-loss medications, and med management appointments    Valet parking services will be available as well.          Signed, Henry Bollman, MD  06/11/2023 9:59 AM    [facility]

## 2024-03-06 ENCOUNTER — Telehealth: Payer: Self-pay

## 2024-03-06 NOTE — Telephone Encounter (Signed)
-----   Message from Lamarr Hummer sent at 03/02/2024  5:02 PM EDT ----- Regarding: call and make apt Looks like PCP is faxing over some [facility] reports and notes. Can you get him in for an apt with Dr. Wonda in the next available. Can you put review PCP notes scanned into chart in the apt notes? Thanks!   KT

## 2024-03-06 NOTE — Telephone Encounter (Signed)
 Attempted to call pt, unable to reach. LMTCB to get appt with Dr. Wonda scheduled.

## 2024-03-23 ENCOUNTER — Telehealth: Payer: Self-pay

## 2024-03-23 NOTE — Telephone Encounter (Signed)
 Attempted to call pt x2, unable to reach. LMTCB to discuss possible appt with Dr. Wonda on 11/10 at 8:40 AM.

## 2024-03-23 NOTE — Telephone Encounter (Signed)
-----   Message from Lamarr Hummer sent at 03/02/2024  5:02 PM EDT ----- Regarding: call and make apt Looks like PCP is faxing over some [facility] reports and notes. Can you get him in for an apt with Dr. Wonda in the next available. Can you put review PCP notes scanned into chart in the apt notes? Thanks!   KT

## 2024-05-03 ENCOUNTER — Telehealth: Payer: Self-pay | Admitting: Cardiovascular Disease

## 2024-05-03 NOTE — Telephone Encounter (Signed)
 Spoke with pt. Stated I did not see any imaging appointments at our office. Pt has appt with Dr Wonda on 2/5.

## 2024-05-03 NOTE — Telephone Encounter (Signed)
 Pt thought he was supposed to be having some type of imaging done this week but can't remember what. Pt requesting c/b.  Please advise.

## 2024-06-22 ENCOUNTER — Encounter: Payer: Self-pay | Admitting: Cardiovascular Disease

## 2024-06-22 ENCOUNTER — Ambulatory Visit: Admitting: Cardiovascular Disease

## 2024-06-22 VITALS — BP 123/86 | HR 82 | Ht 75.0 in | Wt 336.0 lb

## 2024-06-22 DIAGNOSIS — I712 Thoracic aortic aneurysm, without rupture, unspecified: Secondary | ICD-10-CM | POA: Diagnosis not present

## 2024-06-22 DIAGNOSIS — Q2112 Patent foramen ovale: Secondary | ICD-10-CM | POA: Diagnosis not present

## 2024-06-22 DIAGNOSIS — I2510 Atherosclerotic heart disease of native coronary artery without angina pectoris: Secondary | ICD-10-CM

## 2024-06-22 DIAGNOSIS — I10 Essential (primary) hypertension: Secondary | ICD-10-CM | POA: Diagnosis not present

## 2024-06-22 DIAGNOSIS — E782 Mixed hyperlipidemia: Secondary | ICD-10-CM

## 2024-06-22 NOTE — Progress Notes (Signed)
 " Cardiology Office Note:    Date:  06/22/2024   ID:  [name], [name] [birth year], MRN [id number]  PCP:  Cleotilde Planas, MD   [facility] Providers Cardiologist:  Ozell Fell, MD     Referring MD: Cleotilde Planas, MD   Chief Complaint  Patient presents with   Hypertension    History of Present Illness:    [name] is a 54 y.o. 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 Reports that he's been struggling with anxiety/panic attacks.  He continues to work full-time.  He has no exertional chest pain, chest pressure, or shortness of breath.  When he has a panic attack, he describes racing heart and shortness of breath, with symptoms relieved by rest and relaxation.  Past Medical History:  Diagnosis Date   Arthritis    neck   Carpal tunnel syndrome on both sides 08/2014   DDD (degenerative disc disease), lumbar    History of kidney stones    Hypertension    under control with med., has been on med. x 1 yr.   Lung nodule    noted on CTA in 2/22 // Chest CT WO contrast 6/22: LUL nodule resolved, ascending aorta 4.2 x 4.2 cm, cholelithiasis, R kidney stone   Pre-diabetes    Sleep apnea    CPAP   Stroke (HCC) 11/16/2019   mild weakness, no assistive devices   Thoracic aortic aneurysm    MRA 5/22: Ascending thoracic aorta aneurysm 41 mm    Current Medications: Active Medications[1]   Allergies:   Patient has no known allergies.   ROS:   Please see the history of present illness.    All other systems reviewed and are negative.  EKGs/Labs/Other Studies Reviewed:    The following studies were reviewed today: Cardiac Studies & Procedures   ______________________________________________________________________________________________ CARDIAC  CATHETERIZATION  CARDIAC CATHETERIZATION 01/31/2020  Conclusion Successful transcatheter PFO closure using a 25 mm Amplatzer PFO Occluder under ICE and fluoroscopic guidance     ECHOCARDIOGRAM  ECHOCARDIOGRAM COMPLETE BUBBLE STUDY 02/26/2021  Narrative ECHOCARDIOGRAM REPORT    Patient Name:   [name] Date of Exam: 02/26/2021 Medical Rec #:  [id number]      Height:       75.0 in Accession #:    [id number]     Weight:       325.0 lb Date of Birth:  May 21, 1970      BSA:          2.697 m Patient Age:    50 years       BP:           136/97 mmHg Patient Gender: M              HR:           79 bpm. Exam Location:  [address]  Procedure: 2D Echo, Cardiac Doppler, Color Doppler and Saline Contrast Bubble Study  Indications:    Z87.74 s/p PFO closure  History:        Patient has no prior history of Echocardiogram examinations and Patient has prior history of Echocardiogram examinations, most recent 07/25/2020. Stroke, Signs/Symptoms:Palpitations; Risk Factors:Hypertension, HLD and Sleep Apnea.  Sonographer:    Waldo Guadalajara RCS Referring Phys: [id number] KATHRYN R THOMPSON  IMPRESSIONS  1. Left ventricular ejection fraction, by estimation, is 60 to 65%. The left ventricle has normal function. The left ventricle has no regional wall motion abnormalities. There is mild left ventricular hypertrophy of the basal-septal segment. Left ventricular diastolic parameters were normal. 2. Right ventricular systolic function is normal. The right ventricular size is normal. There is normal pulmonary artery systolic pressure. 3. Left atrial size was mildly dilated. 4. Right atrial size was mildly dilated. 5. The mitral valve is normal in structure. Trivial mitral valve regurgitation. No evidence of mitral stenosis. 6. The aortic valve is normal in structure. There is mild calcification of the aortic valve. Aortic valve regurgitation is not visualized. No aortic stenosis is present. 7.  Aortic dilatation noted. There is moderate dilatation of the aortic root, measuring 46 mm. There is moderate dilatation of the ascending aorta, measuring 44 mm. 8. The inferior vena cava is normal in size with greater than 50% respiratory variability, suggesting right atrial pressure of 3 mmHg. 9. Agitated saline contrast bubble study was negative, with no evidence of any interatrial shunt.  FINDINGS Left Ventricle: Left ventricular ejection fraction, by estimation, is 60 to 65%. The left ventricle has normal function. The left ventricle has no regional wall motion abnormalities. The left ventricular internal cavity size was normal in size. There is mild left ventricular hypertrophy of the basal-septal segment. Left ventricular diastolic parameters were normal.  Right Ventricle: The right ventricular size is normal. No increase in right ventricular wall thickness. Right ventricular systolic function is normal. There is normal pulmonary artery systolic pressure. The tricuspid regurgitant velocity is 1.78 m/s, and with an assumed right atrial pressure of 3 mmHg, the estimated right ventricular systolic pressure is 15.7 mmHg.  Left Atrium: Left atrial size was mildly dilated.  Right Atrium: Right atrial size was mildly dilated.  Pericardium: There is no evidence of pericardial effusion.  Mitral Valve: The mitral valve is normal in structure. Trivial mitral valve regurgitation. No evidence of mitral valve stenosis.  Tricuspid Valve: The tricuspid valve is normal in structure. Tricuspid valve regurgitation is trivial. No evidence of tricuspid stenosis.  Aortic Valve: The aortic valve is normal in structure. There is mild calcification of the aortic valve. Aortic valve regurgitation is not visualized. No aortic stenosis is present.  Pulmonic Valve: The pulmonic valve was normal in structure. Pulmonic valve regurgitation is trivial. No evidence of pulmonic stenosis.  Aorta: The aortic root is  normal in size and structure and aortic dilatation noted. There is moderate dilatation of the aortic root, measuring 46 mm. There is moderate dilatation of the ascending aorta, measuring 44 mm.  Venous: The inferior vena cava is normal in size with greater than 50% respiratory variability, suggesting right atrial pressure of 3 mmHg.  IAS/Shunts: No atrial level shunt detected by color flow Doppler. Agitated saline contrast was given intravenously to evaluate for intracardiac shunting. Agitated saline contrast bubble study was negative, with no evidence of any interatrial shunt.   LEFT VENTRICLE PLAX 2D LVIDd:         5.10 cm   Diastology LVIDs:         3.20 cm   LV e' medial:    6.64 cm/s LV PW:         0.90 cm   LV E/e' medial:  9.0 LV IVS:        1.40 cm   LV e' lateral:   10.90 cm/s LVOT diam:     2.60 cm   LV E/e' lateral:  5.5 LV SV:         104 LV SV Index:   39 LVOT Area:     5.31 cm   RIGHT VENTRICLE RV Basal diam:  3.70 cm RV S prime:     13.10 cm/s TAPSE (M-mode): 2.2 cm RVSP:           15.7 mmHg  LEFT ATRIUM             Index        RIGHT ATRIUM           Index LA diam:        3.50 cm 1.30 cm/m   RA Pressure: 3.00 mmHg LA Vol (A2C):   80.0 ml 29.66 ml/m  RA Area:     18.90 cm LA Vol (A4C):   42.5 ml 15.76 ml/m  RA Volume:   52.00 ml  19.28 ml/m LA Biplane Vol: 61.7 ml 22.87 ml/m AORTIC VALVE LVOT Vmax:   105.00 cm/s LVOT Vmean:  69.200 cm/s LVOT VTI:    0.196 m  AORTA Ao Root diam: 4.60 cm Ao Asc diam:  4.40 cm  MITRAL VALVE               TRICUSPID VALVE MV Area (PHT):             TR Peak grad:   12.7 mmHg MV Decel Time:             TR Vmax:        178.00 cm/s MV E velocity: 59.70 cm/s  Estimated RAP:  3.00 mmHg MV A velocity: 73.90 cm/s  RVSP:           15.7 mmHg MV E/A ratio:  0.81 SHUNTS Systemic VTI:  0.20 m Systemic Diam: 2.60 cm  Toribio Fuel MD Electronically signed by Toribio Fuel MD Signature Date/Time: 02/26/2021/3:16:11  PM    Final   TEE  ECHO TEE 11/21/2019  Narrative TRANSESOPHOGEAL ECHO REPORT    Patient Name:   HAMP MORELAND Date of Exam: 11/21/2019 Medical Rec #:  [id number]      Height:       75.0 in Accession #:    [id number]     Weight:       325.0 lb Date of Birth:  1971-01-20      BSA:          2.697 m Patient Age:    48 years       BP:           157/106 mmHg Patient Gender: M              HR:           73 bpm. Exam Location:  Inpatient  Procedure: Transesophageal Echo, Cardiac Doppler and Color Doppler  Indications:     Stroke  History:         Patient has prior history of Echocardiogram examinations, most recent 11/17/2019.  Sonographer:     Charlie Jointer Referring Phys:  [id number] LAURA R INGOLD Diagnosing Phys: Shelda Bruckner MD  PROCEDURE: After discussion of the risks and benefits of a TEE, an informed consent was obtained from the patient. The transesophogeal probe was passed without difficulty through the esophogus of the patient. Local oropharyngeal anesthetic was provided with Cetacaine . Sedation performed by different physician. Image quality was good. The patient developed no complications during the procedure.  IMPRESSIONS   1. Left ventricular ejection fraction, by estimation, is 60 to 65%. The left  ventricle has normal function. The left ventricle has no regional wall motion abnormalities. 2. Right ventricular systolic function is normal. The right ventricular size is normal. 3. No left atrial/left atrial appendage thrombus was detected. 4. The mitral valve is normal in structure. Trivial mitral valve regurgitation. No evidence of mitral stenosis. 5. The aortic valve is tricuspid. Aortic valve regurgitation is trivial. No aortic stenosis is present. 6. There is mild (Grade II) plaque involving the descending aorta. 7. No shunt seen by color flow doppler. Agitated saline contrast bubble study was positive with shunting observed within 3-6 cardiac cycles suggestive  of interatrial shunt.  Conclusion(s)/Recommendation(s): Study positive for intra-atrial flow communication (positive bubble study). Highly mobile intra-atrial septum, but no clear PFO or ASD color flow seen despite multiple angles/windows. Recommend consideration of additional outpatient imaging (cardiac MRI vs. Cardiac CT) to further evaluation anatomy prior to consideration of transcatheter closure.  FINDINGS Left Ventricle: Left ventricular ejection fraction, by estimation, is 60 to 65%. The left ventricle has normal function. The left ventricle has no regional wall motion abnormalities. The left ventricular internal cavity size was normal in size. There is no left ventricular hypertrophy.  Right Ventricle: The right ventricular size is normal. No increase in right ventricular wall thickness. Right ventricular systolic function is normal.  Left Atrium: Left atrial size was not well visualized. No left atrial/left atrial appendage thrombus was detected.  Right Atrium: Right atrial size was not well visualized.  Pericardium: There is no evidence of pericardial effusion.  Mitral Valve: The mitral valve is normal in structure. Trivial mitral valve regurgitation. No evidence of mitral valve stenosis. There is no evidence of mitral valve vegetation.  Tricuspid Valve: The tricuspid valve is normal in structure. Tricuspid valve regurgitation is trivial. No evidence of tricuspid stenosis. There is no evidence of tricuspid valve vegetation.  Aortic Valve: Trivial regurgitation seen at Aortic aspect of RCC-NCC commissure. The aortic valve is tricuspid. Aortic valve regurgitation is trivial. No aortic stenosis is present. There is no evidence of aortic valve vegetation.  Pulmonic Valve: The pulmonic valve was normal in structure. Pulmonic valve regurgitation is trivial. There is no evidence of pulmonic valve vegetation.  Aorta: The aortic root is normal in size and structure. There is mild (Grade II)  plaque involving the descending aorta.  IAS/Shunts: The interatrial septum is aneurysmal. No shunt seen by color flow doppler. Agitated saline contrast was given intravenously to evaluate for intracardiac shunting. Agitated saline contrast bubble study was positive with shunting observed within 3-6 cardiac cycles suggestive of interatrial shunt.  Shelda Bruckner MD Electronically signed by Shelda Bruckner MD Signature Date/Time: 11/21/2019/4:36:07 PM    Final    CT SCANS  CT CORONARY MORPH W/CTA COR W/SCORE 07/05/2020  Addendum 07/05/2020  6:51 PM ADDENDUM REPORT: 07/05/2020 18:49  CLINICAL DATA:  67M with hypertension, hyperlipidemia, CVA, PFO s/p closure, family history of premature CAD, morbid obesity and OSA with chest pain.  EXAM: Cardiac/Coronary  CT  TECHNIQUE: The patient was scanned on a [employer].  FINDINGS: A 120 kV prospective scan was triggered in the descending thoracic aorta at 111 HU's. Axial non-contrast 3 mm slices were carried out through the heart. The data set was analyzed on a dedicated work station and scored using the Agatson method. Gantry rotation speed was 250 msecs and collimation was .6 mm. No beta blockade and 0.8 mg of sl NTG was given. The 3D data set was reconstructed in 5% intervals of the 67-82 % of the R-R  cycle. Diastolic phases were analyzed on a dedicated work station using MPR, MIP and VRT modes. The patient received 80 cc of contrast.  Aorta: Ascending aorta mildly dilated. 4.1 cm. Minimal calcification of the aortic arch. No dissection.  Aortic Valve:  Trileaflet.  Minimal calcification.  Coronary Arteries:  Normal coronary origin.  Right dominance.  RCA is a large dominant artery that gives rise to PDA and PLVB. There is trivial (<25%) calcified plaque in the mid and distal RCA.  Left main is a large artery that gives rise to LAD and LCX arteries.  LAD is a large vessel that has minimal (<25%)  calcified plaque proximally.  LCX is a large, non-dominant artery that gives rise to a tiny OM1 branch and a large OM2. There is trivial (<25%) calcified plaque in the proximal and mid LCX. OM2 has trivial plaque.  Other findings:  Normal pulmonary vein drainage into the left atrium.  Normal let atrial appendage without a thrombus.  Normal size of the pulmonary artery.  There is an Amplatzer PFO occlusion device in the interatrial septum.  IMPRESSION: 1. Coronary calcium  score of 336. This was 98th percentile for age and sex matched control.  2. Normal coronary origin with right dominance.  3. There is minimal (<25%) calcified plaque in the LAD, LCX, and RCA arteries. CAD RADS1.  4.  Consider non-cardiac causes of chest pain.  5. Recommend aggressive risk factor modification including high-potency statin.  Annabella Scarce, MD   Electronically Signed By: Annabella Scarce On: 07/05/2020 18:49  Narrative EXAM: OVER-READ INTERPRETATION  CT CHEST  The following report is an over-read performed by radiologist Dr. Isla Blind of [facility] on 07/05/2020. This over-read does not include interpretation of cardiac or coronary anatomy or pathology. The coronary calcium  score/coronary CTA interpretation by the cardiologist is attached.  COMPARISON:  None aside from abdominal CTs from 2021 and chest x-rays.  FINDINGS: Vascular: See dedicated report for coronary CT a and cardiovascular structures. Note of atrial septal closure device seen on chest x-ray.  Mediastinum/Nodes: Limited imaging of the mediastinum without signs of adenopathy. Esophagus grossly normal.  Lungs/Pleura:  Multifocal areas of nodularity particularly in the LEFT upper lobe. (Image 9 of series 12 with 4 mm ill-defined nodule. Also small ill-defined nodule on image 18 of series 12 and images 7 and 8 of series 12. Other areas of ground-glass, subtle in the LEFT upper lobe. The  visualized airways are patent. No sign of pleural effusion. Small nodule in the RIGHT chest, 3-4 mm, along the minor fissure on image 11 of series 12. Basilar atelectasis. No consolidation. No pleural effusion.  Upper Abdomen: Hepatic steatosis. At least moderate. Limited imaging of upper abdominal contents is otherwise unremarkable.  Musculoskeletal: No acute musculoskeletal process.  IMPRESSION: 1. Multifocal areas of nodularity particularly in the LEFT upper lobe, with a few ill-defined nodules. Findings are nonspecific but could be seen in the setting of viral or atypical pneumonia. Given nodular appearance would suggest follow-up in 3 months to ensure resolution. 2. Hepatic steatosis. 3. See dedicated report for coronary CT a and cardiovascular structures.  Electronically Signed: By: Isla Blind M.D. On: 07/05/2020 17:36     ______________________________________________________________________________________________      EKG:        Recent Labs: No results found for requested labs within last 365 days.  Recent Lipid Panel    Component Value Date/Time   CHOL 158 07/04/2021 0837   TRIG 89 07/04/2021 0837   HDL 39 (  L) 07/04/2021 0837   CHOLHDL 4.1 07/04/2021 0837   CHOLHDL 4.5 11/16/2019 1623   VLDL 7 11/16/2019 1623   LDLCALC 102 (H) 07/04/2021 0837     Risk Assessment/Calculations:                Physical Exam:    VS:  BP 123/86 (BP Location: Left Arm)   Pulse 82   Ht 6' 3 (1.905 m)   Wt (!) 336 lb (152.4 kg)   SpO2 95%   BMI 42.00 kg/m     Wt Readings from Last 3 Encounters:  06/22/24 (!) 336 lb (152.4 kg)  06/11/23 (!) 334 lb 9.6 oz (151.8 kg)  04/06/23 (!) 335 lb (152 kg)     GEN:  Well nourished, well developed in no acute distress HEENT: Normal NECK: No JVD; No carotid bruits LYMPHATICS: No lymphadenopathy CARDIAC: RRR, no murmurs, rubs, gallops RESPIRATORY:  Clear to auscultation without rales, wheezing or rhonchi  ABDOMEN:  Soft, non-tender, non-distended MUSCULOSKELETAL:  No edema; No deformity  SKIN: Warm and dry NEUROLOGIC:  Alert and oriented x 3 PSYCHIATRIC:  Normal affect   Assessment & Plan PFO (patent foramen ovale) Patient status post transcatheter closure.  No residual shunt on follow-up echo imaging.  Lifelong aspirin  recommended.  He has not been taking this, but will start. Thoracic aortic aneurysm without rupture, unspecified part Patient's most recent imaging study from 2024 reviewed demonstrating ascending aortic dilatation of 42 mm, stable from prior studies.  Indexed to his size, this is very mild dilatation.  Continue blood pressure control. Essential hypertension Blood pressure well-controlled on losartan . Mixed hyperlipidemia Patient is statin intolerant.  He has Repatha  at home, prescribed by his PCP.  I discussed the importance of this with him at length.  He will start using it. 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  Again, we discussed long-term management with focus on prevention.  He understands this includes dietary modification with focus on weight loss, regular aerobic exercise, and compliance with medical therapy.            Medication Adjustments/Labs and Tests Ordered: Current medicines are reviewed at length with the patient today.  Concerns regarding medicines are outlined above.  Orders Placed This Encounter  Procedures   EKG 12-Lead   No orders of the defined types were placed in this encounter.   Patient Instructions  Medication Instructions:  START Aspirin  81 mg once daily   RESTART Repatha  (prescribed by primary care)  *If you need a refill on your cardiac medications before your next appointment, please call your pharmacy*  Lab Work: None ordered today. If you have labs (blood work) drawn today and your tests are completely normal, you will receive your  results only by: MyChart Message (if you have MyChart) OR A paper copy in the mail If you have any lab test that is abnormal or we need to change your treatment, we will call you to review the results.  Testing/Procedures: None ordered today.  Follow-Up: At [facility], you and your health needs are our priority.  As part of our continuing mission to provide you with exceptional heart care, our providers are all part of one team.  This team includes your primary Cardiologist (physician) and Advanced Practice Providers or APPs (Physician Assistants and Nurse Practitioners) who all work together to provide you with the care you need, when you need it.  Your next appointment:  1 year(s)  Provider:   Ozell Fell, MD     Signed, Ozell Fell, MD  06/22/2024 1:25 PM    [facility]     [1]  Current Meds  Medication Sig   DULoxetine  (CYMBALTA ) 60 MG capsule Take 60 mg by mouth daily.   gabapentin (NEURONTIN) 300 MG capsule Take 300 mg by mouth 2 (two) times daily. (Patient taking differently: Take 300 mg by mouth daily at 6 (six) AM.)   ibuprofen  (ADVIL ) 200 MG tablet as needed for moderate pain (pain score 4-6).   losartan  (COZAAR ) 100 MG tablet Take 1 tablet (100 mg total) by mouth daily.   metFORMIN (GLUCOPHAGE) 500 MG tablet Take 1,000 mg by mouth daily. Has not started. For pre-diabetes   "

## 2024-06-22 NOTE — Patient Instructions (Signed)
 Medication Instructions:  START Aspirin  81 mg once daily   RESTART Repatha  (prescribed by primary care)  *If you need a refill on your cardiac medications before your next appointment, please call your pharmacy*  Lab Work: None ordered today. If you have labs (blood work) drawn today and your tests are completely normal, you will receive your results only by: MyChart Message (if you have MyChart) OR A paper copy in the mail If you have any lab test that is abnormal or we need to change your treatment, we will call you to review the results.  Testing/Procedures: None ordered today.  Follow-Up: At [facility], you and your health needs are our priority.  As part of our continuing mission to provide you with exceptional heart care, our providers are all part of one team.  This team includes your primary Cardiologist (physician) and Advanced Practice Providers or APPs (Physician Assistants and Nurse Practitioners) who all work together to provide you with the care you need, when you need it.  Your next appointment:   1 year(s)  Provider:   Ozell Fell, MD

## 2024-06-22 NOTE — Assessment & Plan Note (Addendum)
 Patient's most recent imaging study from 2024 reviewed demonstrating ascending aortic dilatation of 42 mm, stable from prior studies.  Indexed to his size, this is very mild dilatation.  Continue blood pressure control.
